# Patient Record
Sex: Female | Born: 1940
Health system: Southern US, Community
[De-identification: ages and names within clinical notes are randomized; demographics above are authoritative.]

## PROBLEM LIST (undated history)

## (undated) DIAGNOSIS — K589 Irritable bowel syndrome without diarrhea: Secondary | ICD-10-CM

## (undated) DIAGNOSIS — N6489 Other specified disorders of breast: Principal | ICD-10-CM

## (undated) DIAGNOSIS — N3000 Acute cystitis without hematuria: Secondary | ICD-10-CM

## (undated) DIAGNOSIS — E1122 Type 2 diabetes mellitus with diabetic chronic kidney disease: Secondary | ICD-10-CM

## (undated) DIAGNOSIS — L439 Lichen planus, unspecified: Secondary | ICD-10-CM

## (undated) DIAGNOSIS — E785 Hyperlipidemia, unspecified: Secondary | ICD-10-CM

## (undated) DIAGNOSIS — F329 Major depressive disorder, single episode, unspecified: Secondary | ICD-10-CM

## (undated) DIAGNOSIS — Z780 Asymptomatic menopausal state: Secondary | ICD-10-CM

## (undated) DIAGNOSIS — K851 Biliary acute pancreatitis without necrosis or infection: Secondary | ICD-10-CM

## (undated) DIAGNOSIS — M1990 Unspecified osteoarthritis, unspecified site: Secondary | ICD-10-CM

## (undated) DIAGNOSIS — I10 Essential (primary) hypertension: Secondary | ICD-10-CM

## (undated) DIAGNOSIS — N183 Chronic kidney disease, stage 3 unspecified: Secondary | ICD-10-CM

## (undated) DIAGNOSIS — F419 Anxiety disorder, unspecified: Secondary | ICD-10-CM

## (undated) DIAGNOSIS — N3941 Urge incontinence: Secondary | ICD-10-CM

## (undated) DIAGNOSIS — R011 Cardiac murmur, unspecified: Secondary | ICD-10-CM

## (undated) DIAGNOSIS — K219 Gastro-esophageal reflux disease without esophagitis: Secondary | ICD-10-CM

## (undated) DIAGNOSIS — F32A Depression, unspecified: Secondary | ICD-10-CM

## (undated) DIAGNOSIS — M797 Fibromyalgia: Secondary | ICD-10-CM

## (undated) DIAGNOSIS — E119 Type 2 diabetes mellitus without complications: Secondary | ICD-10-CM

## (undated) DIAGNOSIS — G4700 Insomnia, unspecified: Secondary | ICD-10-CM

## (undated) HISTORY — DX: Fibromyalgia: M79.7

## (undated) HISTORY — DX: Type 2 diabetes mellitus without complications: E11.9

## (undated) HISTORY — PX: BREAST SURGERY: SHX581

## (undated) HISTORY — DX: Major depressive disorder, single episode, unspecified: F32.9

## (undated) HISTORY — DX: Acute cystitis without hematuria: N30.00

## (undated) HISTORY — DX: Hyperlipidemia, unspecified: E78.5

## (undated) HISTORY — DX: Unspecified osteoarthritis, unspecified site: M19.90

## (undated) HISTORY — DX: Irritable bowel syndrome, unspecified: K58.9

## (undated) HISTORY — PX: EYE SURGERY: SHX253

## (undated) HISTORY — DX: Chronic kidney disease, stage 3 unspecified: N18.30

## (undated) HISTORY — DX: Lichen planus, unspecified: L43.9

## (undated) HISTORY — DX: Biliary acute pancreatitis without necrosis or infection: K85.10

## (undated) HISTORY — DX: Depression, unspecified: F32.A

## (undated) HISTORY — PX: TOTAL KNEE ARTHROPLASTY: SHX125

## (undated) HISTORY — DX: Essential (primary) hypertension: I10

## (undated) HISTORY — DX: Type 2 diabetes mellitus with diabetic chronic kidney disease: E11.22

## (undated) HISTORY — DX: Urge incontinence: N39.41

## (undated) HISTORY — PX: CATARACT EXTRACTION W/ INTRAOCULAR LENS IMPLANT: SHX1309

## (undated) HISTORY — DX: Cardiac murmur, unspecified: R01.1

## (undated) HISTORY — DX: Asymptomatic menopausal state: Z78.0

## (undated) HISTORY — DX: Anxiety disorder, unspecified: F41.9

## (undated) HISTORY — DX: Insomnia, unspecified: G47.00

## (undated) HISTORY — PX: JOINT REPLACEMENT: SHX530

## (undated) HISTORY — DX: Chronic kidney disease, stage 3 (moderate): N18.3

## (undated) HISTORY — DX: Gastro-esophageal reflux disease without esophagitis: K21.9

## (undated) HISTORY — DX: Other specified disorders of breast: N64.89

---

## 1968-10-25 HISTORY — PX: TUBAL LIGATION: SHX77

## 1999-01-01 ENCOUNTER — Other Ambulatory Visit: Admission: RE | Admit: 1999-01-01 | Discharge: 1999-01-01 | Payer: Self-pay | Admitting: Family Medicine

## 2000-03-16 ENCOUNTER — Other Ambulatory Visit: Admission: RE | Admit: 2000-03-16 | Discharge: 2000-03-16 | Payer: Self-pay | Admitting: Family Medicine

## 2000-04-26 ENCOUNTER — Encounter: Payer: Self-pay | Admitting: Orthopedic Surgery

## 2000-04-26 ENCOUNTER — Ambulatory Visit (HOSPITAL_COMMUNITY): Admission: RE | Admit: 2000-04-26 | Discharge: 2000-04-26 | Payer: Self-pay | Admitting: Orthopedic Surgery

## 2000-12-14 ENCOUNTER — Encounter: Admission: RE | Admit: 2000-12-14 | Discharge: 2001-03-14 | Payer: Self-pay | Admitting: Endocrinology

## 2001-05-04 ENCOUNTER — Other Ambulatory Visit: Admission: RE | Admit: 2001-05-04 | Discharge: 2001-05-04 | Payer: Self-pay | Admitting: Family Medicine

## 2002-01-30 HISTORY — PX: TRIGGER FINGER RELEASE: SHX641

## 2002-07-24 ENCOUNTER — Other Ambulatory Visit: Admission: RE | Admit: 2002-07-24 | Discharge: 2002-07-24 | Payer: Self-pay | Admitting: Family Medicine

## 2002-07-26 ENCOUNTER — Ambulatory Visit (HOSPITAL_COMMUNITY): Admission: RE | Admit: 2002-07-26 | Discharge: 2002-07-26 | Payer: Self-pay | Admitting: *Deleted

## 2002-07-26 ENCOUNTER — Encounter (INDEPENDENT_AMBULATORY_CARE_PROVIDER_SITE_OTHER): Payer: Self-pay | Admitting: Specialist

## 2002-10-25 HISTORY — PX: CHOLECYSTECTOMY: SHX55

## 2003-01-05 ENCOUNTER — Inpatient Hospital Stay (HOSPITAL_COMMUNITY): Admission: EM | Admit: 2003-01-05 | Discharge: 2003-01-09 | Payer: Self-pay | Admitting: Emergency Medicine

## 2003-01-05 ENCOUNTER — Encounter: Payer: Self-pay | Admitting: Emergency Medicine

## 2003-01-07 ENCOUNTER — Encounter (INDEPENDENT_AMBULATORY_CARE_PROVIDER_SITE_OTHER): Payer: Self-pay | Admitting: Specialist

## 2003-01-07 ENCOUNTER — Encounter: Payer: Self-pay | Admitting: General Surgery

## 2003-09-10 ENCOUNTER — Other Ambulatory Visit: Admission: RE | Admit: 2003-09-10 | Discharge: 2003-09-10 | Payer: Self-pay | Admitting: Family Medicine

## 2004-02-23 HISTORY — PX: ANKLE SURGERY: SHX546

## 2004-04-15 ENCOUNTER — Encounter: Admission: RE | Admit: 2004-04-15 | Discharge: 2004-07-14 | Payer: Self-pay | Admitting: Orthopedic Surgery

## 2004-09-03 ENCOUNTER — Ambulatory Visit: Payer: Self-pay | Admitting: Endocrinology

## 2004-09-30 ENCOUNTER — Ambulatory Visit: Payer: Self-pay | Admitting: Endocrinology

## 2004-10-23 ENCOUNTER — Ambulatory Visit: Payer: Self-pay | Admitting: Endocrinology

## 2004-12-25 ENCOUNTER — Ambulatory Visit: Payer: Self-pay | Admitting: Internal Medicine

## 2005-02-03 ENCOUNTER — Other Ambulatory Visit: Admission: RE | Admit: 2005-02-03 | Discharge: 2005-02-03 | Payer: Self-pay | Admitting: Obstetrics and Gynecology

## 2005-04-28 ENCOUNTER — Ambulatory Visit: Payer: Self-pay | Admitting: Endocrinology

## 2005-05-10 ENCOUNTER — Ambulatory Visit: Payer: Self-pay | Admitting: Endocrinology

## 2005-08-10 ENCOUNTER — Ambulatory Visit: Payer: Self-pay | Admitting: Endocrinology

## 2005-08-27 ENCOUNTER — Ambulatory Visit: Payer: Self-pay | Admitting: Endocrinology

## 2005-12-22 ENCOUNTER — Ambulatory Visit: Payer: Self-pay | Admitting: Endocrinology

## 2006-01-25 ENCOUNTER — Ambulatory Visit: Payer: Self-pay | Admitting: Gastroenterology

## 2006-02-19 ENCOUNTER — Emergency Department (HOSPITAL_COMMUNITY): Admission: EM | Admit: 2006-02-19 | Discharge: 2006-02-19 | Payer: Self-pay | Admitting: Emergency Medicine

## 2006-02-23 ENCOUNTER — Ambulatory Visit: Payer: Self-pay | Admitting: Gastroenterology

## 2006-02-23 ENCOUNTER — Encounter (INDEPENDENT_AMBULATORY_CARE_PROVIDER_SITE_OTHER): Payer: Self-pay | Admitting: Specialist

## 2006-03-03 ENCOUNTER — Ambulatory Visit: Payer: Self-pay | Admitting: Gastroenterology

## 2006-04-04 ENCOUNTER — Ambulatory Visit: Payer: Self-pay | Admitting: Gastroenterology

## 2006-04-11 ENCOUNTER — Ambulatory Visit: Payer: Self-pay | Admitting: Endocrinology

## 2006-04-25 ENCOUNTER — Ambulatory Visit: Payer: Self-pay

## 2006-04-25 ENCOUNTER — Encounter: Payer: Self-pay | Admitting: Cardiology

## 2006-05-18 ENCOUNTER — Ambulatory Visit: Payer: Self-pay | Admitting: *Deleted

## 2006-05-20 ENCOUNTER — Ambulatory Visit: Payer: Self-pay | Admitting: Internal Medicine

## 2006-05-20 ENCOUNTER — Inpatient Hospital Stay (HOSPITAL_BASED_OUTPATIENT_CLINIC_OR_DEPARTMENT_OTHER): Admission: RE | Admit: 2006-05-20 | Discharge: 2006-05-20 | Payer: Self-pay | Admitting: Internal Medicine

## 2006-06-03 ENCOUNTER — Ambulatory Visit: Payer: Self-pay | Admitting: Cardiology

## 2006-06-22 ENCOUNTER — Ambulatory Visit: Payer: Self-pay | Admitting: *Deleted

## 2006-07-21 ENCOUNTER — Ambulatory Visit: Payer: Self-pay | Admitting: *Deleted

## 2006-07-28 ENCOUNTER — Ambulatory Visit: Payer: Self-pay | Admitting: Endocrinology

## 2006-09-27 ENCOUNTER — Ambulatory Visit: Payer: Self-pay | Admitting: Endocrinology

## 2007-01-18 ENCOUNTER — Ambulatory Visit: Payer: Self-pay | Admitting: *Deleted

## 2007-02-13 ENCOUNTER — Ambulatory Visit: Payer: Self-pay | Admitting: Endocrinology

## 2007-05-18 ENCOUNTER — Ambulatory Visit: Payer: Self-pay | Admitting: Endocrinology

## 2007-05-22 ENCOUNTER — Encounter: Payer: Self-pay | Admitting: Endocrinology

## 2007-05-22 DIAGNOSIS — E785 Hyperlipidemia, unspecified: Secondary | ICD-10-CM

## 2007-05-22 DIAGNOSIS — I10 Essential (primary) hypertension: Secondary | ICD-10-CM

## 2007-05-22 DIAGNOSIS — E1121 Type 2 diabetes mellitus with diabetic nephropathy: Secondary | ICD-10-CM

## 2007-05-22 DIAGNOSIS — E1169 Type 2 diabetes mellitus with other specified complication: Secondary | ICD-10-CM | POA: Insufficient documentation

## 2007-05-22 DIAGNOSIS — E1159 Type 2 diabetes mellitus with other circulatory complications: Secondary | ICD-10-CM

## 2007-05-22 DIAGNOSIS — F411 Generalized anxiety disorder: Secondary | ICD-10-CM

## 2007-07-03 ENCOUNTER — Encounter: Payer: Self-pay | Admitting: Endocrinology

## 2007-07-11 ENCOUNTER — Ambulatory Visit: Payer: Self-pay | Admitting: Endocrinology

## 2008-01-08 ENCOUNTER — Encounter: Payer: Self-pay | Admitting: Endocrinology

## 2008-01-10 ENCOUNTER — Ambulatory Visit: Payer: Self-pay | Admitting: Endocrinology

## 2008-01-10 DIAGNOSIS — M810 Age-related osteoporosis without current pathological fracture: Secondary | ICD-10-CM | POA: Insufficient documentation

## 2008-01-10 LAB — CONVERTED CEMR LAB
AST: 30 units/L (ref 0–37)
Albumin: 3.4 g/dL — ABNORMAL LOW (ref 3.5–5.2)
Chloride: 94 meq/L — ABNORMAL LOW (ref 96–112)
GFR calc non Af Amer: 48 mL/min
HDL: 42.8 mg/dL (ref >39.0)
Hgb A1c MFr Bld: 9.9 % — ABNORMAL HIGH (ref 4.6–6.0)
LDL Cholesterol: 57 mg/dL (ref 0–99)
Potassium: 4 meq/L (ref 3.5–5.1)
Sodium: 136 meq/L (ref 135–145)
TSH: 2.59 microintl units/mL (ref 0.35–5.50)
VLDL: 23 mg/dL (ref 0–40)

## 2008-01-11 ENCOUNTER — Encounter: Payer: Self-pay | Admitting: Endocrinology

## 2008-01-12 LAB — CONVERTED CEMR LAB
Calcium, Total (PTH): 9.3 mg/dL (ref 8.4–10.5)
PTH: 28 pg/mL (ref 14.0–72.0)
Total Protein, Serum Electrophoresis: 7.2 g/dL (ref 6.0–8.3)

## 2008-01-22 ENCOUNTER — Telehealth (INDEPENDENT_AMBULATORY_CARE_PROVIDER_SITE_OTHER): Payer: Self-pay | Admitting: *Deleted

## 2008-01-24 ENCOUNTER — Ambulatory Visit: Payer: Self-pay | Admitting: Endocrinology

## 2008-01-24 DIAGNOSIS — N3000 Acute cystitis without hematuria: Secondary | ICD-10-CM | POA: Insufficient documentation

## 2008-01-24 LAB — CONVERTED CEMR LAB
Glucose, Urine, Semiquant: NEGATIVE
Ketones, urine, test strip: NEGATIVE
Protein, U semiquant: NEGATIVE

## 2008-01-29 ENCOUNTER — Telehealth (INDEPENDENT_AMBULATORY_CARE_PROVIDER_SITE_OTHER): Payer: Self-pay | Admitting: *Deleted

## 2008-01-30 ENCOUNTER — Ambulatory Visit: Payer: Self-pay | Admitting: Endocrinology

## 2008-01-31 LAB — CONVERTED CEMR LAB
Bilirubin Urine: NEGATIVE
Crystals: NEGATIVE
Mucus, UA: NEGATIVE
Nitrite: NEGATIVE
RBC / HPF: NONE SEEN
Specific Gravity, Urine: <=1.005 (ref 1.000–1.03)
Urobilinogen, UA: 0.2 (ref 0.0–1.0)
pH: 6 (ref 5.0–8.0)

## 2008-02-09 ENCOUNTER — Encounter: Payer: Self-pay | Admitting: Endocrinology

## 2008-02-19 ENCOUNTER — Telehealth (INDEPENDENT_AMBULATORY_CARE_PROVIDER_SITE_OTHER): Payer: Self-pay | Admitting: *Deleted

## 2008-03-04 ENCOUNTER — Ambulatory Visit: Payer: Self-pay | Admitting: Endocrinology

## 2008-03-04 LAB — CONVERTED CEMR LAB
Ketones, urine, test strip: NEGATIVE
Specific Gravity, Urine: 1.01
Urobilinogen, UA: 0.2
pH: 5

## 2008-03-14 ENCOUNTER — Telehealth (INDEPENDENT_AMBULATORY_CARE_PROVIDER_SITE_OTHER): Payer: Self-pay | Admitting: *Deleted

## 2008-03-25 ENCOUNTER — Telehealth (INDEPENDENT_AMBULATORY_CARE_PROVIDER_SITE_OTHER): Payer: Self-pay | Admitting: *Deleted

## 2008-04-11 ENCOUNTER — Encounter: Payer: Self-pay | Admitting: Endocrinology

## 2008-05-16 ENCOUNTER — Ambulatory Visit: Payer: Self-pay | Admitting: Endocrinology

## 2008-05-16 LAB — CONVERTED CEMR LAB
Bilirubin Urine: NEGATIVE
Glucose, Urine, Semiquant: NEGATIVE
Nitrite: NEGATIVE
Specific Gravity, Urine: <1.005
pH: 5

## 2008-05-17 ENCOUNTER — Encounter: Payer: Self-pay | Admitting: Endocrinology

## 2008-05-20 ENCOUNTER — Telehealth (INDEPENDENT_AMBULATORY_CARE_PROVIDER_SITE_OTHER): Payer: Self-pay | Admitting: *Deleted

## 2008-05-22 ENCOUNTER — Ambulatory Visit: Payer: Self-pay | Admitting: Endocrinology

## 2008-05-24 LAB — CONVERTED CEMR LAB: Hgb A1c MFr Bld: 9.1 % — ABNORMAL HIGH (ref 4.6–6.0)

## 2008-06-19 ENCOUNTER — Telehealth (INDEPENDENT_AMBULATORY_CARE_PROVIDER_SITE_OTHER): Payer: Self-pay | Admitting: *Deleted

## 2008-06-21 ENCOUNTER — Telehealth: Payer: Self-pay | Admitting: Endocrinology

## 2008-07-04 ENCOUNTER — Ambulatory Visit: Payer: Self-pay | Admitting: Endocrinology

## 2008-07-04 LAB — CONVERTED CEMR LAB
Bilirubin Urine: NEGATIVE
Ketones, urine, test strip: NEGATIVE
Nitrite: NEGATIVE
Protein, U semiquant: NEGATIVE

## 2008-07-08 ENCOUNTER — Telehealth (INDEPENDENT_AMBULATORY_CARE_PROVIDER_SITE_OTHER): Payer: Self-pay | Admitting: *Deleted

## 2008-07-11 ENCOUNTER — Telehealth (INDEPENDENT_AMBULATORY_CARE_PROVIDER_SITE_OTHER): Payer: Self-pay | Admitting: *Deleted

## 2008-07-12 ENCOUNTER — Telehealth (INDEPENDENT_AMBULATORY_CARE_PROVIDER_SITE_OTHER): Payer: Self-pay | Admitting: *Deleted

## 2008-07-17 ENCOUNTER — Telehealth (INDEPENDENT_AMBULATORY_CARE_PROVIDER_SITE_OTHER): Payer: Self-pay | Admitting: *Deleted

## 2008-07-17 ENCOUNTER — Ambulatory Visit: Payer: Self-pay | Admitting: Endocrinology

## 2008-07-17 DIAGNOSIS — M545 Low back pain: Secondary | ICD-10-CM | POA: Insufficient documentation

## 2008-07-25 ENCOUNTER — Ambulatory Visit: Payer: Self-pay | Admitting: Endocrinology

## 2008-07-29 ENCOUNTER — Telehealth: Payer: Self-pay | Admitting: Endocrinology

## 2008-08-29 ENCOUNTER — Ambulatory Visit: Payer: Self-pay | Admitting: Endocrinology

## 2008-08-29 DIAGNOSIS — K589 Irritable bowel syndrome without diarrhea: Secondary | ICD-10-CM

## 2008-09-05 ENCOUNTER — Encounter: Payer: Self-pay | Admitting: Endocrinology

## 2008-11-28 ENCOUNTER — Telehealth (INDEPENDENT_AMBULATORY_CARE_PROVIDER_SITE_OTHER): Payer: Self-pay | Admitting: *Deleted

## 2008-12-05 ENCOUNTER — Telehealth (INDEPENDENT_AMBULATORY_CARE_PROVIDER_SITE_OTHER): Payer: Self-pay | Admitting: *Deleted

## 2008-12-24 ENCOUNTER — Ambulatory Visit: Payer: Self-pay | Admitting: Endocrinology

## 2008-12-24 DIAGNOSIS — L98499 Non-pressure chronic ulcer of skin of other sites with unspecified severity: Secondary | ICD-10-CM

## 2008-12-26 ENCOUNTER — Telehealth (INDEPENDENT_AMBULATORY_CARE_PROVIDER_SITE_OTHER): Payer: Self-pay | Admitting: *Deleted

## 2008-12-30 ENCOUNTER — Telehealth (INDEPENDENT_AMBULATORY_CARE_PROVIDER_SITE_OTHER): Payer: Self-pay | Admitting: *Deleted

## 2009-01-13 ENCOUNTER — Ambulatory Visit: Payer: Self-pay | Admitting: Endocrinology

## 2009-02-03 ENCOUNTER — Ambulatory Visit: Payer: Self-pay | Admitting: Endocrinology

## 2009-02-03 LAB — CONVERTED CEMR LAB
Glucose, Urine, Semiquant: >=1000
Protein, U semiquant: NEGATIVE

## 2009-03-06 ENCOUNTER — Telehealth (INDEPENDENT_AMBULATORY_CARE_PROVIDER_SITE_OTHER): Payer: Self-pay | Admitting: *Deleted

## 2009-03-12 ENCOUNTER — Telehealth: Payer: Self-pay | Admitting: Endocrinology

## 2009-03-14 ENCOUNTER — Ambulatory Visit: Payer: Self-pay | Admitting: Endocrinology

## 2009-03-14 DIAGNOSIS — R32 Unspecified urinary incontinence: Secondary | ICD-10-CM | POA: Insufficient documentation

## 2009-03-19 ENCOUNTER — Encounter: Payer: Self-pay | Admitting: Endocrinology

## 2009-03-19 ENCOUNTER — Telehealth: Payer: Self-pay | Admitting: Internal Medicine

## 2009-03-20 ENCOUNTER — Encounter: Payer: Self-pay | Admitting: Endocrinology

## 2009-03-31 ENCOUNTER — Telehealth (INDEPENDENT_AMBULATORY_CARE_PROVIDER_SITE_OTHER): Payer: Self-pay | Admitting: *Deleted

## 2009-04-01 ENCOUNTER — Telehealth (INDEPENDENT_AMBULATORY_CARE_PROVIDER_SITE_OTHER): Payer: Self-pay | Admitting: *Deleted

## 2009-04-04 ENCOUNTER — Ambulatory Visit: Payer: Self-pay | Admitting: Endocrinology

## 2009-04-04 DIAGNOSIS — N760 Acute vaginitis: Secondary | ICD-10-CM | POA: Insufficient documentation

## 2009-04-04 DIAGNOSIS — G4700 Insomnia, unspecified: Secondary | ICD-10-CM

## 2009-04-04 LAB — CONVERTED CEMR LAB
AST: 27 units/L (ref 0–37)
Albumin: 3.8 g/dL (ref 3.5–5.2)
Alkaline Phosphatase: 70 units/L (ref 39–117)
Bilirubin, Direct: 0.1 mg/dL (ref 0.0–0.3)
CO2: 31 meq/L (ref 19–32)
Calcium: 9.4 mg/dL (ref 8.4–10.5)
Cholesterol: 128 mg/dL (ref 0–200)
Creatinine,U: 59.4 mg/dL
Glucose, Bld: 128 mg/dL — ABNORMAL HIGH (ref 70–99)
Hgb A1c MFr Bld: 8.8 % — ABNORMAL HIGH (ref 4.6–6.5)
LDL Cholesterol: 59 mg/dL (ref 0–99)
Microalb, Ur: 0.4 mg/dL (ref 0.0–1.9)
Potassium: 4.2 meq/L (ref 3.5–5.1)
Sodium: 137 meq/L (ref 135–145)
Total CHOL/HDL Ratio: 3
Triglycerides: 103 mg/dL (ref 0.0–149.0)

## 2009-04-09 ENCOUNTER — Telehealth: Payer: Self-pay | Admitting: Endocrinology

## 2009-04-10 ENCOUNTER — Telehealth: Payer: Self-pay | Admitting: Internal Medicine

## 2009-04-11 ENCOUNTER — Ambulatory Visit: Payer: Self-pay | Admitting: Internal Medicine

## 2009-04-11 ENCOUNTER — Inpatient Hospital Stay (HOSPITAL_COMMUNITY): Admission: EM | Admit: 2009-04-11 | Discharge: 2009-04-16 | Payer: Self-pay | Admitting: Emergency Medicine

## 2009-04-18 ENCOUNTER — Telehealth: Payer: Self-pay | Admitting: Endocrinology

## 2009-04-20 ENCOUNTER — Inpatient Hospital Stay (HOSPITAL_COMMUNITY): Admission: EM | Admit: 2009-04-20 | Discharge: 2009-04-24 | Payer: Self-pay | Admitting: Emergency Medicine

## 2009-04-20 ENCOUNTER — Ambulatory Visit: Payer: Self-pay | Admitting: Internal Medicine

## 2009-04-21 ENCOUNTER — Ambulatory Visit: Payer: Self-pay | Admitting: Internal Medicine

## 2009-04-24 ENCOUNTER — Telehealth: Payer: Self-pay | Admitting: Endocrinology

## 2009-04-29 ENCOUNTER — Telehealth (INDEPENDENT_AMBULATORY_CARE_PROVIDER_SITE_OTHER): Payer: Self-pay | Admitting: *Deleted

## 2009-05-01 ENCOUNTER — Telehealth: Payer: Self-pay | Admitting: Endocrinology

## 2009-05-01 ENCOUNTER — Emergency Department (HOSPITAL_COMMUNITY): Admission: EM | Admit: 2009-05-01 | Discharge: 2009-05-01 | Payer: Self-pay | Admitting: Emergency Medicine

## 2009-05-05 ENCOUNTER — Ambulatory Visit: Payer: Self-pay | Admitting: Endocrinology

## 2009-05-05 ENCOUNTER — Telehealth: Payer: Self-pay | Admitting: Endocrinology

## 2009-05-06 ENCOUNTER — Ambulatory Visit: Payer: Self-pay | Admitting: Endocrinology

## 2009-05-09 ENCOUNTER — Ambulatory Visit: Payer: Self-pay | Admitting: Endocrinology

## 2009-05-09 DIAGNOSIS — E876 Hypokalemia: Secondary | ICD-10-CM

## 2009-05-09 LAB — CONVERTED CEMR LAB
BUN: 17 mg/dL (ref 6–23)
Chloride: 97 meq/L (ref 96–112)
Creatinine, Ser: 1.2 mg/dL (ref 0.4–1.2)
Glucose, Bld: 240 mg/dL — ABNORMAL HIGH (ref 70–99)
Potassium: 3.8 meq/L (ref 3.5–5.1)

## 2009-05-12 ENCOUNTER — Telehealth: Payer: Self-pay | Admitting: Endocrinology

## 2009-05-14 ENCOUNTER — Encounter: Payer: Self-pay | Admitting: Internal Medicine

## 2009-05-29 ENCOUNTER — Telehealth (INDEPENDENT_AMBULATORY_CARE_PROVIDER_SITE_OTHER): Payer: Self-pay | Admitting: *Deleted

## 2009-06-03 ENCOUNTER — Telehealth (INDEPENDENT_AMBULATORY_CARE_PROVIDER_SITE_OTHER): Payer: Self-pay | Admitting: *Deleted

## 2009-06-10 ENCOUNTER — Encounter: Payer: Self-pay | Admitting: Endocrinology

## 2009-07-04 ENCOUNTER — Encounter: Payer: Self-pay | Admitting: Endocrinology

## 2009-07-17 ENCOUNTER — Ambulatory Visit: Payer: Self-pay | Admitting: Endocrinology

## 2009-07-17 DIAGNOSIS — M797 Fibromyalgia: Secondary | ICD-10-CM

## 2009-07-17 DIAGNOSIS — I059 Rheumatic mitral valve disease, unspecified: Secondary | ICD-10-CM | POA: Insufficient documentation

## 2009-07-17 DIAGNOSIS — L439 Lichen planus, unspecified: Secondary | ICD-10-CM

## 2009-07-17 DIAGNOSIS — E875 Hyperkalemia: Secondary | ICD-10-CM

## 2009-07-18 LAB — CONVERTED CEMR LAB
BUN: 23 mg/dL (ref 6–23)
CO2: 32 meq/L (ref 19–32)
Calcium: 9.1 mg/dL (ref 8.4–10.5)
Creatinine, Ser: 1.2 mg/dL (ref 0.4–1.2)
GFR calc non Af Amer: 47.33 mL/min (ref >60)
Glucose, Bld: 151 mg/dL — ABNORMAL HIGH (ref 70–99)

## 2009-07-21 ENCOUNTER — Encounter: Payer: Self-pay | Admitting: Endocrinology

## 2009-08-26 ENCOUNTER — Encounter: Payer: Self-pay | Admitting: Endocrinology

## 2009-10-03 ENCOUNTER — Telehealth: Payer: Self-pay | Admitting: Endocrinology

## 2009-10-15 ENCOUNTER — Encounter: Payer: Self-pay | Admitting: Endocrinology

## 2009-10-22 ENCOUNTER — Ambulatory Visit: Payer: Self-pay | Admitting: Endocrinology

## 2009-10-22 ENCOUNTER — Encounter (INDEPENDENT_AMBULATORY_CARE_PROVIDER_SITE_OTHER): Payer: Self-pay | Admitting: *Deleted

## 2009-10-31 ENCOUNTER — Encounter: Payer: Self-pay | Admitting: Endocrinology

## 2009-12-11 ENCOUNTER — Telehealth (INDEPENDENT_AMBULATORY_CARE_PROVIDER_SITE_OTHER): Payer: Self-pay | Admitting: *Deleted

## 2009-12-19 ENCOUNTER — Encounter: Payer: Self-pay | Admitting: Endocrinology

## 2009-12-30 ENCOUNTER — Encounter: Admission: RE | Admit: 2009-12-30 | Discharge: 2010-03-30 | Payer: Self-pay | Admitting: Orthopedic Surgery

## 2010-01-02 ENCOUNTER — Encounter: Payer: Self-pay | Admitting: Endocrinology

## 2010-01-28 ENCOUNTER — Encounter: Payer: Self-pay | Admitting: Internal Medicine

## 2010-03-31 ENCOUNTER — Encounter: Admission: RE | Admit: 2010-03-31 | Discharge: 2010-06-29 | Payer: Self-pay | Admitting: Orthopedic Surgery

## 2010-05-07 ENCOUNTER — Encounter: Payer: Self-pay | Admitting: Endocrinology

## 2010-06-11 ENCOUNTER — Encounter: Payer: Self-pay | Admitting: Cardiology

## 2010-06-22 ENCOUNTER — Ambulatory Visit: Payer: Self-pay | Admitting: Cardiology

## 2010-07-10 ENCOUNTER — Ambulatory Visit (HOSPITAL_BASED_OUTPATIENT_CLINIC_OR_DEPARTMENT_OTHER): Admission: RE | Admit: 2010-07-10 | Discharge: 2010-07-10 | Payer: Self-pay | Admitting: Orthopedic Surgery

## 2010-09-25 ENCOUNTER — Ambulatory Visit (HOSPITAL_BASED_OUTPATIENT_CLINIC_OR_DEPARTMENT_OTHER)
Admission: RE | Admit: 2010-09-25 | Discharge: 2010-09-25 | Payer: Self-pay | Source: Home / Self Care | Admitting: Orthopedic Surgery

## 2010-10-26 ENCOUNTER — Encounter
Admission: RE | Admit: 2010-10-26 | Discharge: 2010-11-24 | Payer: Self-pay | Source: Home / Self Care | Attending: Orthopedic Surgery | Admitting: Orthopedic Surgery

## 2010-11-25 ENCOUNTER — Encounter: Payer: Self-pay | Admitting: Physical Therapy

## 2010-11-26 NOTE — Letter (Signed)
Summary: [facility]   Imported By: Sherian Rein 05/12/2010 11:45:54  _____________________________________________________________________  External Attachment:    Type:   Image     Comment:   External Document

## 2010-11-26 NOTE — Consult Note (Signed)
Summary: Aundra Dubin, MD  Aundra Dubin, MD   Imported By: Lester Chamberino 11/18/2009 16:32:12  _____________________________________________________________________  External Attachment:    Type:   Image     Comment:   External Document

## 2010-11-26 NOTE — Progress Notes (Signed)
Summary: Records request from [facility] for patient transfer  Request for records received from [facility]. Request forwarded to [employer]. Dena Chavis  December 11, 2009 1:05 PM  Appended Document: Records request from [facility] for patient transfer 2nd request for records received from [facility]. Request forwarded to [employer].

## 2010-11-26 NOTE — Assessment & Plan Note (Signed)
Summary: np- last seen 2008 knee surgeryclearance  Medications Added FISH OIL 1000 MG  CAPS (OMEGA-3 FATTY ACIDS) take 1 by mouth once daily.tidtab NOVOLOG MIX 70/30 70-30 % SUSP (INSULIN ASPART PROT & ASPART) use as directed VITAMIN B-12 100 MCG TABS (CYANOCOBALAMIN) Take 1 tablet by mouth once a day TRAZODONE HCL 150 MG TABS (TRAZODONE HCL) Take 1 tablet by mouth once a day at bedtime ENABLEX 15 MG XR24H-TAB (DARIFENACIN HYDROBROMIDE) Take 1 tablet by mouth once a day MAG-OX 400 400 MG TABS (MAGNESIUM OXIDE) Take 1 tablet by mouth once a day DESOWEN 0.05 % OINT (DESONIDE) apply topically as directed ESTRACE 0.1 MG/GM CREA (ESTRADIOL) apply topically as directed CYMBALTA 60 MG CPEP (DULOXETINE HCL) Take 1 tablet by mouth once a day MACRODANTIN 100 MG CAPS (NITROFURANTOIN MACROCRYSTAL) Take 1 tablet by mouth once a day CHOLESTYRAMINE  POWD (CHOLESTYRAMINE) Take 1 packet by mouth two times a day BENADRYL 25 MG TABS (DIPHENHYDRAMINE HCL) Take 1 tablet by mouth three times a day ALIGN  CAPS (PROBIOTIC PRODUCT) Take 1 tablet by mouth once a day MULTIVITAMINS  TABS (MULTIPLE VITAMIN) Take 1/2 tablet by mouth two times a day BEANO  TABS (ALPHA-D-GALACTOSIDASE) Take 1 tablet by mouth two times a day KETOROLAC TROMETHAMINE 10 MG TABS (KETOROLAC TROMETHAMINE) take one by mouth every six hours as needed pain        Visit Type:  Initial Consult Referring Provider:  Dr. Marlowe Kays Primary Provider:  Dr. Kyra Manges   History of Present Illness: 70 year old woman presents for cardiology consultation. Records indicate that she was seen by Dr. Corinda Gubler back in 2008 and has a history of normal coronary arteries documented at cardiac catheterization in 2007. She is referred for preoperative evaluation. Elective arthroscopic left knee surgery is being considered at this time, with general anesthesia.  She does not endorse any exertional chest pain. She reports NYHA class II dyspnea on exertion,  with main functional limitation related to leg/knee pain. She has occasional ankle edema, not progressive. No orthopnea, PND, or palpitations.  I reviewed her medications, which are multiple, outlined below.  Electrocardiogram done recently at [facility] showed sinus rhythm at 86 beats per minutes with left atrial enlargement and increased voltage, nonspecific T wave changes.  Preventive Screening-Counseling & Management  Alcohol-Tobacco     Smoking Status: never  Current Medications (verified): 1)  Toprol Xl 25 Mg  Tb24 (Metoprolol Succinate) .... Take 1/2  By Mouth Once Daily 2)  Lexapro 20 Mg  Tabs (Escitalopram Oxalate) .... Take 2 By Mouth Qd 3)  Furosemide 40 Mg  Tabs (Furosemide) .... Take 1 By Mouth Qd 4)  Lipitor 20 Mg  Tabs (Atorvastatin Calcium) .... Take 1 By Mouth Qd 5)  Nexium 40 Mg  Cpdr (Esomeprazole Magnesium) .... Take 1 By Mouth Two Times A Day Qd 6)  Calcium 600/vitamin D 600-200 Mg-Unit  Tabs (Calcium Carbonate-Vitamin D) .... Take 1 By Mouth Two Times A Day 7)  Fish Oil 1000 Mg  Caps (Omega-3 Fatty Acids) .... Take 1 By Mouth Once Daily.tidtab 8)  Klor-Con M20 20 Meq  Tbcr (Potassium Chloride Crys Cr) .... Take 1 By Mouth Qd 9)  Bd Insulin Syringe Ultrafine 31g X 5/16" 0.3 Ml  Misc (Insulin Syringe-Needle U-100) .... Use As Directed 10)  Bd Insulin Syringe Ultrafine 31g X 5/16" 1 Ml  Misc (Insulin Syringe-Needle U-100) .... Use As Directed 11)  Novolog Mix 70/30 70-30 % Susp (Insulin Aspart Prot & Aspart) .... Use As  Directed 12)  Freestyle Test   Strp (Glucose Blood) .... Qid 250.00 13)  Adult Aspirin Ec Low Strength 81 Mg Tbec (Aspirin) .Marland Kitchen.. 1 By Mouth Once Daily 14)  Vitamin B-12 100 Mcg Tabs (Cyanocobalamin) .... Take 1 Tablet By Mouth Once A Day 15)  Trazodone Hcl 150 Mg Tabs (Trazodone Hcl) .... Take 1 Tablet By Mouth Once A Day At Bedtime 16)  Enablex 15 Mg Xr24h-Tab (Darifenacin Hydrobromide) .... Take 1 Tablet By Mouth Once A Day 17)   Mag-Ox 400 400 Mg Tabs (Magnesium Oxide) .... Take 1 Tablet By Mouth Once A Day 18)  Desowen 0.05 % Oint (Desonide) .... Apply Topically As Directed 19)  Estrace 0.1 Mg/gm Crea (Estradiol) .... Apply Topically As Directed 20)  Cymbalta 60 Mg Cpep (Duloxetine Hcl) .... Take 1 Tablet By Mouth Once A Day 21)  Macrodantin 100 Mg Caps (Nitrofurantoin Macrocrystal) .... Take 1 Tablet By Mouth Once A Day 22)  Cholestyramine  Powd (Cholestyramine) .... Take 1 Packet By Mouth Two Times A Day 23)  Benadryl 25 Mg Tabs (Diphenhydramine Hcl) .... Take 1 Tablet By Mouth Three Times A Day 24)  Align  Caps (Probiotic Product) .... Take 1 Tablet By Mouth Once A Day 25)  Multivitamins  Tabs (Multiple Vitamin) .... Take 1/2 Tablet By Mouth Two Times A Day 26)  Beano  Tabs (Alpha-D-Galactosidase) .... Take 1 Tablet By Mouth Two Times A Day 27)  Ketorolac Tromethamine 10 Mg Tabs (Ketorolac Tromethamine) .... Take One By Mouth Every Six Hours As Needed Pain  Allergies: 1)  ! Hydrocodone 2)  ! * Levbid 3)  ! * Desipamine 4)  ! Amoxicillin 5)  ! Ambien (Zolpidem Tartrate) 6)  ! Vicodin 7)  ! Morphine  Comments:  Nurse/Medical Assistant: The patient's medication list and allergies were reviewed with the patient and were updated in the Medication and Allergy Lists.  Past History:  Family History: Last updated: 06/22/2010 Family History of Diabetes  Social History: Last updated: 06/22/2010 Widowed 2010 Retired Tobacco Use - No Alcohol Use - no  Past Medical History: Fibromyalgia IBS MVP Diabetic Retinopathy Osteoporosis Acute cystitis Depression Diabetes Type 2 Hyperlipidemia Hypertension  Past Surgical History: Cataract Lens Implant (09/2003, & 12/2003) Cholecystectomy (2004) Tubal ligation (1970) Ankle surgery (02/2004)  Family History: Family History of Diabetes  Social History: Widowed 2010 Retired Tobacco Use - No Alcohol Use - no  Review of Systems       The patient  complains of peripheral edema.  The patient denies anorexia, fever, weight loss, chest pain, syncope, prolonged cough, headaches, hemoptysis, melena, hematochezia, and severe indigestion/heartburn.         Otherwise reviewed and negative except as outlined.  Vital Signs:  Patient profile:   70 year old female Height:      63 inches Weight:      207 pounds BMI:     36.80 Pulse rate:   66 / minute BP sitting:   122 / 75  (left arm) Cuff size:   large  Vitals Entered By: Carlye Grippe (June 22, 2010 2:14 PM)  Nutrition Counseling: Patient's BMI is greater than 25 and therefore counseled on weight management options.  Physical Exam  Additional Exam:  Obese woman in no acute distress. HEENT: Conjunctiva and lids normal, oropharynx with moist mucosa. Neck: Supple, no elevated JVP or carotid bruits. No thyromegaly. Lungs: Clear to auscultation, nonlabored. Cardiac: Regular rate and rhythm, no mid systolic click, no loud systolic murmur. No S3. Abdomen: Soft, nontender,  bowel sounds present. Extremity: No frank pitting edema, distal pulses 1-2+. Skin: Warm and dry. Musculoskeletal: No kyphosis. Neuropsychiatric: Alert and oriented x3, affect appropriate.   Cardiac Cath  Procedure date:  05/20/2006  Findings:      Central aortic pressure 99/53, the mean is 74.  LV pressure 90 with an LVEDP  of 17.  There is no aortic stenosis.   Left main was normal.   Left circumflex was a large vessel.  It gave off a large ramus, a small  first posterolateral and a large second posterolateral.  Angiographically  normal.  LAD was a long vessel wrapping the apex that gave off three small  diagonals, angiographically normal.   Right coronary artery was a moderate-sized dominant vessel.  It gave off a  large acute marginal branch and a small PDA.  It was angiographically  normal.   Left ventriculogram done in the RAO position showed a hyperdynamic ventricle  with an EF of 70-75%.  No wall  motion abnormalities.  No mitral  regurgitation.  Impression & Recommendations:  Problem # 1:  PREOPERATIVE EXAMINATION (ICD-V22.26)  70 year old woman with history of hypertension, type 2 diabetes mellitus, hyperlipidemia, and documented normal coronary arteries at cardiac catheterization 2007, being considered for elective arthroscopic left knee surgery under general anesthesia. No active anginal symptoms or progressive shortness of breath. Recently electrocardiogram is nonspecific. Medical therapy includes a beta blocker. At this point would not anticipate any further cardiac evaluation prior to proceeding with surgery, which overall should be fairly low risk from a cardiac perspective.  Problem # 2:  MITRAL VALVE PROLAPSE (ICD-424.0)  Diagnosis based on limited history. Evaluation by echocardiography in 2004 revealed only some thickening of the valve but no obvious prolapse, only trace mitral regurgitation. No major exam findings to suggest progression.  Her updated medication list for this problem includes:    Toprol Xl 25 Mg Tb24 (Metoprolol succinate) .Marland Kitchen... Take 1/2  by mouth once daily    Furosemide 40 Mg Tabs (Furosemide) .Marland Kitchen... Take 1 by mouth qd  Problem # 3:  HYPERTENSION (ICD-401.9)  Blood pressure well controlled today.  Her updated medication list for this problem includes:    Toprol Xl 25 Mg Tb24 (Metoprolol succinate) .Marland Kitchen... Take 1/2  by mouth once daily    Furosemide 40 Mg Tabs (Furosemide) .Marland Kitchen... Take 1 by mouth qd    Adult Aspirin Ec Low Strength 81 Mg Tbec (Aspirin) .Marland Kitchen... 1 by mouth once daily  Patient Instructions: 1)  Your physician wants you to follow-up in: 6 months. You will receive a reminder letter in the mail one-two months in advance. If you don't receive a letter, please call our office to schedule the follow-up appointment. 2)  Your physician recommends that you continue on your current medications as directed. Please refer to the Current Medication list given  to you today.

## 2010-11-26 NOTE — Letter (Signed)
Summary: External Correspondence/ OFFICE VISIT [facility]  External Correspondence/ OFFICE VISIT [facility]   Imported By: Dorise Hiss 06/12/2010 08:35:14  _____________________________________________________________________  External Attachment:    Type:   Image     Comment:   External Document

## 2010-11-26 NOTE — Letter (Signed)
Summary: CMN for [facility]  CMN for [facility]   Imported By: Sherian Rein 01/30/2010 13:35:02  _____________________________________________________________________  External Attachment:    Type:   Image     Comment:   External Document

## 2010-11-26 NOTE — Letter (Signed)
Summary: Office Visit / [facility]   Office Visit / [facility]   Imported By: Lennie Odor 10/27/2009 14:18:06  _____________________________________________________________________  External Attachment:    Type:   Image     Comment:   External Document

## 2010-11-26 NOTE — Assessment & Plan Note (Signed)
Summary: 6 WKS FU--$50---STC   Vital Signs:  Patient Profile:   70 Years Old Female Weight:      213.6 pounds Temp:     97.4 degrees F oral Pulse rate:   82 / minute BP sitting:   124 / 70  (left arm) Cuff size:   regular  Pt. in pain?   no  Vitals Entered By: Orlan Leavens (July 04, 2008 1:12 PM)                  Chief Complaint:  6 week follow-up.  History of Present Illness: now takes levemir 45 units two times a day.  cbg's range 63-400. it is generally high later in the day. pt states few weeks intermittent dysuria, and assoc slight low-back pain.    Current Allergies: ! HYDROCODONE ! * LEVBID ! * DESIPAMINE ! AMOXICILLIN  Past Medical History:    Reviewed history from 05/16/2008 and no changes required:       Fibromyalgia       IBS       MVP       DM Retinopathy       ACUTE CYSTITIS (ICD-595.0)       ANTIHYPERLIPIDEMIC USE, LONG TERM (ICD-V58.69)       UNSPECIFIED OSTEOPOROSIS (ICD-733.00)       HYPERTENSION (ICD-401.9)       HYPERLIPIDEMIA (ICD-272.4)       DIABETES MELLITUS, TYPE I (ICD-250.01)       DEPRESSION (ICD-311)     Review of Systems  The patient denies syncope and fever.     Physical Exam  General:     well developed, well nourished, in no acute distress Psych:     alert and cooperative; normal mood and affect; normal attention span and concentration    Impression & Recommendations:  Problem # 1:  DIABETES MELLITUS, TYPE I (ICD-250.01)  Problem # 2:  ACUTE CYSTITIS (ICD-595.0)   Patient Instructions: 1)  change levemir to 60 units am and 30 units pm 2)  urine c/s 3)  ret 21d   ] Laboratory Results   Urine Tests    Routine Urinalysis   Color: lt. yellow Appearance: Clear Glucose: 500   (Normal Range: Negative) Bilirubin: negative   (Normal Range: Negative) Ketone: negative   (Normal Range: Negative) Spec. Gravity: <1.005   (Normal Range: 1.003-1.035) Blood: trace-intact   (Normal Range: Negative) pH: 5.0    (Normal Range: 5.0-8.0) Protein: negative   (Normal Range: Negative) Urobilinogen: 0.2   (Normal Range: 0-1) Nitrite: negative   (Normal Range: Negative) Leukocyte Esterace: moderate   (Normal Range: Negative)

## 2010-11-26 NOTE — Letter (Signed)
Summary: [facility]   Imported By: Sherian Rein 01/07/2010 11:55:55  _____________________________________________________________________  External Attachment:    Type:   Image     Comment:   External Document

## 2010-12-02 ENCOUNTER — Ambulatory Visit: Payer: Medicare Other | Attending: Orthopedic Surgery | Admitting: Physical Therapy

## 2010-12-02 DIAGNOSIS — M25669 Stiffness of unspecified knee, not elsewhere classified: Secondary | ICD-10-CM | POA: Insufficient documentation

## 2010-12-02 DIAGNOSIS — M25569 Pain in unspecified knee: Secondary | ICD-10-CM | POA: Insufficient documentation

## 2010-12-02 DIAGNOSIS — R5381 Other malaise: Secondary | ICD-10-CM | POA: Insufficient documentation

## 2010-12-02 DIAGNOSIS — IMO0001 Reserved for inherently not codable concepts without codable children: Secondary | ICD-10-CM | POA: Insufficient documentation

## 2010-12-02 DIAGNOSIS — R269 Unspecified abnormalities of gait and mobility: Secondary | ICD-10-CM | POA: Insufficient documentation

## 2010-12-07 ENCOUNTER — Ambulatory Visit: Payer: Medicare Other | Admitting: Physical Therapy

## 2010-12-10 ENCOUNTER — Ambulatory Visit: Payer: Medicare Other | Admitting: Physical Therapy

## 2011-01-05 LAB — POCT I-STAT 4, (NA,K, GLUC, HGB,HCT)
Glucose, Bld: 184 mg/dL — ABNORMAL HIGH (ref 70–99)
HCT: 37 % (ref 36.0–46.0)
Hemoglobin: 12.6 g/dL (ref 12.0–15.0)

## 2011-01-05 LAB — GLUCOSE, CAPILLARY: Glucose-Capillary: 169 mg/dL — ABNORMAL HIGH (ref 70–99)

## 2011-01-07 LAB — POCT I-STAT 4, (NA,K, GLUC, HGB,HCT)
Hemoglobin: 14.3 g/dL (ref 12.0–15.0)
Potassium: 4.6 mEq/L (ref 3.5–5.1)
Sodium: 135 mEq/L (ref 135–145)

## 2011-01-31 LAB — URINALYSIS, ROUTINE W REFLEX MICROSCOPIC
Glucose, UA: NEGATIVE mg/dL
Hgb urine dipstick: NEGATIVE
Specific Gravity, Urine: 1.005 (ref 1.005–1.030)
Urobilinogen, UA: 0.2 mg/dL (ref 0.0–1.0)
pH: 6.5 (ref 5.0–8.0)

## 2011-01-31 LAB — BASIC METABOLIC PANEL
CO2: 33 mEq/L — ABNORMAL HIGH (ref 19–32)
Calcium: 8.8 mg/dL (ref 8.4–10.5)
Chloride: 101 mEq/L (ref 96–112)
GFR calc Af Amer: >60 mL/min (ref >60)
Potassium: 3 mEq/L — ABNORMAL LOW (ref 3.5–5.1)
Sodium: 138 mEq/L (ref 135–145)

## 2011-01-31 LAB — DIFFERENTIAL
Basophils Absolute: 0 10*3/uL (ref 0.0–0.1)
Eosinophils Absolute: 0.3 10*3/uL (ref 0.0–0.7)
Eosinophils Relative: 4 % (ref 0–5)
Lymphocytes Relative: 28 % (ref 12–46)

## 2011-01-31 LAB — CBC
HCT: 40.3 % (ref 36.0–46.0)
Hemoglobin: 13.6 g/dL (ref 12.0–15.0)
MCV: 93 fL (ref 78.0–100.0)
RBC: 4.34 MIL/uL (ref 3.87–5.11)
WBC: 8.6 10*3/uL (ref 4.0–10.5)

## 2011-01-31 LAB — COMPREHENSIVE METABOLIC PANEL
ALT: 24 U/L (ref 0–35)
AST: 39 U/L — ABNORMAL HIGH (ref 0–37)
CO2: 28 mEq/L (ref 19–32)
Chloride: 98 mEq/L (ref 96–112)
Creatinine, Ser: 1.3 mg/dL — ABNORMAL HIGH (ref 0.4–1.2)
GFR calc Af Amer: 49 mL/min — ABNORMAL LOW (ref >60)
GFR calc non Af Amer: 41 mL/min — ABNORMAL LOW (ref >60)
Glucose, Bld: 203 mg/dL — ABNORMAL HIGH (ref 70–99)
Total Bilirubin: 0.9 mg/dL (ref 0.3–1.2)

## 2011-01-31 LAB — GLUCOSE, CAPILLARY
Glucose-Capillary: 69 mg/dL — ABNORMAL LOW (ref 70–99)
Glucose-Capillary: 71 mg/dL (ref 70–99)

## 2011-01-31 LAB — LIPASE, BLOOD: Lipase: 12 U/L (ref 11–59)

## 2011-02-01 LAB — CBC
HCT: 30.9 % — ABNORMAL LOW (ref 36.0–46.0)
HCT: 35.2 % — ABNORMAL LOW (ref 36.0–46.0)
Hemoglobin: 10.5 g/dL — ABNORMAL LOW (ref 12.0–15.0)
Hemoglobin: 11.9 g/dL — ABNORMAL LOW (ref 12.0–15.0)
Hemoglobin: 12.3 g/dL (ref 12.0–15.0)
MCHC: 34 g/dL (ref 30.0–36.0)
MCV: 93.7 fL (ref 78.0–100.0)
MCV: 94.3 fL (ref 78.0–100.0)
Platelets: 140 10*3/uL — ABNORMAL LOW (ref 150–400)
Platelets: 154 10*3/uL (ref 150–400)
Platelets: 163 10*3/uL (ref 150–400)
Platelets: 196 10*3/uL (ref 150–400)
RBC: 3.35 MIL/uL — ABNORMAL LOW (ref 3.87–5.11)
RBC: 3.76 MIL/uL — ABNORMAL LOW (ref 3.87–5.11)
RBC: 3.86 MIL/uL — ABNORMAL LOW (ref 3.87–5.11)
RDW: 14.6 % (ref 11.5–15.5)
RDW: 14.9 % (ref 11.5–15.5)
RDW: 15 % (ref 11.5–15.5)
WBC: 7.9 10*3/uL (ref 4.0–10.5)
WBC: 9.1 10*3/uL (ref 4.0–10.5)

## 2011-02-01 LAB — COMPREHENSIVE METABOLIC PANEL
ALT: 28 U/L (ref 0–35)
ALT: 37 U/L — ABNORMAL HIGH (ref 0–35)
AST: 27 U/L (ref 0–37)
Albumin: 3.4 g/dL — ABNORMAL LOW (ref 3.5–5.2)
Alkaline Phosphatase: 66 U/L (ref 39–117)
Alkaline Phosphatase: 73 U/L (ref 39–117)
CO2: 32 mEq/L (ref 19–32)
GFR calc Af Amer: >60 mL/min (ref >60)
GFR calc non Af Amer: 51 mL/min — ABNORMAL LOW (ref >60)
Glucose, Bld: 296 mg/dL — ABNORMAL HIGH (ref 70–99)
Potassium: 3.2 mEq/L — ABNORMAL LOW (ref 3.5–5.1)
Potassium: 3.4 mEq/L — ABNORMAL LOW (ref 3.5–5.1)
Sodium: 134 mEq/L — ABNORMAL LOW (ref 135–145)
Sodium: 136 mEq/L (ref 135–145)
Total Protein: 5.9 g/dL — ABNORMAL LOW (ref 6.0–8.3)
Total Protein: 6.4 g/dL (ref 6.0–8.3)

## 2011-02-01 LAB — URINALYSIS, ROUTINE W REFLEX MICROSCOPIC
Ketones, ur: NEGATIVE mg/dL
Nitrite: NEGATIVE
Protein, ur: NEGATIVE mg/dL
Urobilinogen, UA: 0.2 mg/dL (ref 0.0–1.0)

## 2011-02-01 LAB — GLUCOSE, CAPILLARY
Glucose-Capillary: 122 mg/dL — ABNORMAL HIGH (ref 70–99)
Glucose-Capillary: 127 mg/dL — ABNORMAL HIGH (ref 70–99)
Glucose-Capillary: 150 mg/dL — ABNORMAL HIGH (ref 70–99)
Glucose-Capillary: 152 mg/dL — ABNORMAL HIGH (ref 70–99)
Glucose-Capillary: 153 mg/dL — ABNORMAL HIGH (ref 70–99)
Glucose-Capillary: 170 mg/dL — ABNORMAL HIGH (ref 70–99)
Glucose-Capillary: 177 mg/dL — ABNORMAL HIGH (ref 70–99)
Glucose-Capillary: 193 mg/dL — ABNORMAL HIGH (ref 70–99)
Glucose-Capillary: 197 mg/dL — ABNORMAL HIGH (ref 70–99)
Glucose-Capillary: 202 mg/dL — ABNORMAL HIGH (ref 70–99)
Glucose-Capillary: 212 mg/dL — ABNORMAL HIGH (ref 70–99)
Glucose-Capillary: 218 mg/dL — ABNORMAL HIGH (ref 70–99)
Glucose-Capillary: 251 mg/dL — ABNORMAL HIGH (ref 70–99)
Glucose-Capillary: 280 mg/dL — ABNORMAL HIGH (ref 70–99)
Glucose-Capillary: 284 mg/dL — ABNORMAL HIGH (ref 70–99)
Glucose-Capillary: 305 mg/dL — ABNORMAL HIGH (ref 70–99)
Glucose-Capillary: 68 mg/dL — ABNORMAL LOW (ref 70–99)
Glucose-Capillary: 69 mg/dL — ABNORMAL LOW (ref 70–99)
Glucose-Capillary: 76 mg/dL (ref 70–99)
Glucose-Capillary: 78 mg/dL (ref 70–99)
Glucose-Capillary: 81 mg/dL (ref 70–99)
Glucose-Capillary: 82 mg/dL (ref 70–99)
Glucose-Capillary: 88 mg/dL (ref 70–99)
Glucose-Capillary: 88 mg/dL (ref 70–99)
Glucose-Capillary: 95 mg/dL (ref 70–99)
Glucose-Capillary: 98 mg/dL (ref 70–99)

## 2011-02-01 LAB — BASIC METABOLIC PANEL
BUN: 10 mg/dL (ref 6–23)
BUN: 15 mg/dL (ref 6–23)
BUN: 22 mg/dL (ref 6–23)
CO2: 31 mEq/L (ref 19–32)
CO2: 32 mEq/L (ref 19–32)
Calcium: 8.7 mg/dL (ref 8.4–10.5)
Calcium: 9.1 mg/dL (ref 8.4–10.5)
Chloride: 100 mEq/L (ref 96–112)
Chloride: 102 mEq/L (ref 96–112)
Chloride: 106 mEq/L (ref 96–112)
Chloride: 99 mEq/L (ref 96–112)
Creatinine, Ser: 1.04 mg/dL (ref 0.4–1.2)
Creatinine, Ser: 1.14 mg/dL (ref 0.4–1.2)
GFR calc Af Amer: >60 mL/min (ref >60)
GFR calc Af Amer: >60 mL/min (ref >60)
GFR calc non Af Amer: 47 mL/min — ABNORMAL LOW (ref >60)
GFR calc non Af Amer: 52 mL/min — ABNORMAL LOW (ref >60)
GFR calc non Af Amer: 53 mL/min — ABNORMAL LOW (ref >60)
Glucose, Bld: 172 mg/dL — ABNORMAL HIGH (ref 70–99)
Glucose, Bld: 260 mg/dL — ABNORMAL HIGH (ref 70–99)
Glucose, Bld: 291 mg/dL — ABNORMAL HIGH (ref 70–99)
Potassium: 3.6 mEq/L (ref 3.5–5.1)
Potassium: 3.7 mEq/L (ref 3.5–5.1)
Potassium: 3.8 mEq/L (ref 3.5–5.1)
Sodium: 136 mEq/L (ref 135–145)
Sodium: 137 mEq/L (ref 135–145)
Sodium: 138 mEq/L (ref 135–145)

## 2011-02-01 LAB — DIFFERENTIAL
Basophils Absolute: 0.1 10*3/uL (ref 0.0–0.1)
Basophils Relative: 0 % (ref 0–1)
Eosinophils Absolute: 0.4 10*3/uL (ref 0.0–0.7)
Eosinophils Relative: 4 % (ref 0–5)
Monocytes Absolute: 0.7 10*3/uL (ref 0.1–1.0)
Monocytes Relative: 7 % (ref 3–12)
Monocytes Relative: 7 % (ref 3–12)
Neutrophils Relative %: 62 % (ref 43–77)

## 2011-02-01 LAB — POCT I-STAT, CHEM 8
BUN: 23 mg/dL (ref 6–23)
Calcium, Ion: 1.12 mmol/L (ref 1.12–1.32)
Chloride: 91 mEq/L — ABNORMAL LOW (ref 96–112)
Creatinine, Ser: 1.2 mg/dL (ref 0.4–1.2)
Glucose, Bld: 288 mg/dL — ABNORMAL HIGH (ref 70–99)
TCO2: 31 mmol/L (ref 0–100)

## 2011-02-01 LAB — URINE CULTURE
Colony Count: NO GROWTH
Culture: NO GROWTH

## 2011-02-01 LAB — CULTURE, BLOOD (ROUTINE X 2): Culture: NO GROWTH

## 2011-02-01 LAB — PROTIME-INR: INR: 1 (ref 0.00–1.49)

## 2011-02-03 ENCOUNTER — Other Ambulatory Visit (HOSPITAL_COMMUNITY)

## 2011-02-05 ENCOUNTER — Other Ambulatory Visit: Payer: Self-pay | Admitting: Orthopedic Surgery

## 2011-02-05 ENCOUNTER — Encounter (HOSPITAL_COMMUNITY): Payer: Medicare Other

## 2011-02-05 DIAGNOSIS — Z01812 Encounter for preprocedural laboratory examination: Secondary | ICD-10-CM | POA: Insufficient documentation

## 2011-02-05 DIAGNOSIS — Z01818 Encounter for other preprocedural examination: Secondary | ICD-10-CM | POA: Insufficient documentation

## 2011-02-05 LAB — BASIC METABOLIC PANEL
BUN: 24 mg/dL — ABNORMAL HIGH (ref 6–23)
CO2: 28 mEq/L (ref 19–32)
Calcium: 9.3 mg/dL (ref 8.4–10.5)
Creatinine, Ser: 1.28 mg/dL — ABNORMAL HIGH (ref 0.4–1.2)
GFR calc Af Amer: 50 mL/min — ABNORMAL LOW (ref >60)

## 2011-02-05 LAB — ABO/RH: ABO/RH(D): A POS

## 2011-02-05 LAB — TYPE AND SCREEN
ABO/RH(D): A POS
Antibody Screen: NEGATIVE

## 2011-02-05 LAB — CBC
MCH: 31.2 pg (ref 26.0–34.0)
MCHC: 35 g/dL (ref 30.0–36.0)
MCV: 89.1 fL (ref 78.0–100.0)
Platelets: 182 10*3/uL (ref 150–400)

## 2011-02-05 LAB — SURGICAL PCR SCREEN: MRSA, PCR: NEGATIVE

## 2011-02-09 ENCOUNTER — Inpatient Hospital Stay (HOSPITAL_COMMUNITY)
Admission: RE | Admit: 2011-02-09 | Discharge: 2011-02-15 | DRG: 470 | Disposition: A | Discharge status: Skilled Nursing Facility | Payer: Medicare Other | Source: Ambulatory Visit | Attending: Orthopedic Surgery | Admitting: Orthopedic Surgery

## 2011-02-09 ENCOUNTER — Inpatient Hospital Stay (HOSPITAL_COMMUNITY): Payer: Medicare Other

## 2011-02-09 DIAGNOSIS — D649 Anemia, unspecified: Secondary | ICD-10-CM | POA: Diagnosis not present

## 2011-02-09 DIAGNOSIS — Z01818 Encounter for other preprocedural examination: Secondary | ICD-10-CM

## 2011-02-09 DIAGNOSIS — I10 Essential (primary) hypertension: Secondary | ICD-10-CM | POA: Diagnosis present

## 2011-02-09 DIAGNOSIS — Z01812 Encounter for preprocedural laboratory examination: Secondary | ICD-10-CM

## 2011-02-09 DIAGNOSIS — M1710 Unilateral primary osteoarthritis, unspecified knee: Principal | ICD-10-CM | POA: Diagnosis present

## 2011-02-09 DIAGNOSIS — F329 Major depressive disorder, single episode, unspecified: Secondary | ICD-10-CM | POA: Diagnosis present

## 2011-02-09 DIAGNOSIS — F3289 Other specified depressive episodes: Secondary | ICD-10-CM | POA: Diagnosis present

## 2011-02-09 DIAGNOSIS — E119 Type 2 diabetes mellitus without complications: Secondary | ICD-10-CM | POA: Diagnosis present

## 2011-02-09 DIAGNOSIS — F99 Mental disorder, not otherwise specified: Secondary | ICD-10-CM | POA: Diagnosis not present

## 2011-02-09 LAB — GLUCOSE, CAPILLARY: Glucose-Capillary: 239 mg/dL — ABNORMAL HIGH (ref 70–99)

## 2011-02-10 LAB — GLUCOSE, CAPILLARY
Glucose-Capillary: 353 mg/dL — ABNORMAL HIGH (ref 70–99)
Glucose-Capillary: 345 mg/dL — ABNORMAL HIGH (ref 70–99)
Glucose-Capillary: 113 mg/dL — ABNORMAL HIGH (ref 70–99)

## 2011-02-10 LAB — BASIC METABOLIC PANEL
CO2: 29 mEq/L (ref 19–32)
Chloride: 98 mEq/L (ref 96–112)
GFR calc Af Amer: 56 mL/min — ABNORMAL LOW (ref >60)
Potassium: 4.8 mEq/L (ref 3.5–5.1)
Sodium: 132 mEq/L — ABNORMAL LOW (ref 135–145)

## 2011-02-10 LAB — CBC
Hemoglobin: 11 g/dL — ABNORMAL LOW (ref 12.0–15.0)
MCH: 30.1 pg (ref 26.0–34.0)
Platelets: 140 10*3/uL — ABNORMAL LOW (ref 150–400)
RBC: 3.66 MIL/uL — ABNORMAL LOW (ref 3.87–5.11)
WBC: 8.6 10*3/uL (ref 4.0–10.5)

## 2011-02-10 LAB — PROTIME-INR
INR: 1.06 (ref 0.00–1.49)
Prothrombin Time: 14 seconds (ref 11.6–15.2)

## 2011-02-10 NOTE — Op Note (Signed)
[name], [name]                 ACCOUNT NO.:  [id number]  MEDICAL RECORD NO.:  [id number]           PATIENT TYPE:  I  LOCATION:  1612                         FACILITY:  [facility]  PHYSICIAN:  Marlowe Kays, M.D.  DATE OF BIRTH:  [birth year]  DATE OF PROCEDURE:  02/09/2011 DATE OF DISCHARGE:                              OPERATIVE REPORT   PREOPERATIVE DIAGNOSIS:  Osteoarthritis, left knee.  POSTOPERATIVE DIAGNOSIS:  Osteoarthritis, left knee.  OPERATION:  Osteonics total knee replacement, left.  SURGEON:  Marlowe Kays, MD  ASSISTANT:  Druscilla Brownie. Idolina Primer, [facility]  ANESTHESIA:  General.  JUSTIFICATION FOR PROCEDURE:  She has had several upper arthroscopic procedures on the left knee and has been trying viscosupplementation without relief of symptoms.  At surgery, she was found to have full- thickness wear through the medial femoral condyle.  PROCEDURE IN DETAIL:  Prophylactic antibiotics, satisfied general anesthesia with latex precautions, Foley catheter was inserted. Pneumatic tourniquet applied followed by SureFoot and lateral hip stabilizer.  Left leg was prepped with DuraPrep from tourniquet to ankle, draped in sterile field.  Time-out performed.  Ioban employed. Leg was Esmarched out sterilely and tourniquet inflated to 325 mmHg. Vertical midline incision down to the patellar mechanism with median parapatellar incision to open the joint.  Pes anserinus and medial collateral ligament were elevated off the tibia.  The patellar mechanism was freed up and the patella everted and knee flexed.  Remnants of the menisci and ACL anteriorly were removed.  Pathology was noted.  I made a 5/16-inch drill hole in the distal femur followed by canal finder and the axis liner for a 10 mm distal femoral valgus cut.  I then sized the femur at #5 using the cradle apparatus.  Scribe lines were placed on the distal femur and then I placed the distal femoral cutting jig to make anterior and  posterior cuts and posterior and anterior chamferings.  We then went to the tibia where after making a leveling cut incising for #5, I made my first intramedullary drill hole followed by step-cut drill, canal finder and the internal intramedullary rod set for a 4-mm cut at 90 degrees of the depressed medial tibial plateau.  The cut was made and we then placed the lamina spreader to remove remnants of bone and soft tissue from behind femoral condyles.  I then placed the femoral jig for creating both the patellar groove and the notchplasty.  After these had been accomplished, we went through a trial reduction and found at least a 10 mm space would be adequate.  Using the external rod splitting bimalleolar distance, I made scribe lines on the anterior tibia.  While the knee was in extension, we sized the patella to 26 and placed the patellar cutting jig for 10 mm recess cut followed by the jig to create the fixation drill holes.  I then placed a trial patella and trimmed away bone from around the perimeter.  Returning to the tibia, I stabilized the base plate with 3 pins based on the previous scribe lines and then using the tripod apparatus we reamed up to a 5 cemented.  We then water picked the knee while the methacrylate was being mixed after the components had been opened up.  We then glued in the individual components, starting first with a tibia impacting and removing excess methacrylate.  The same thing was then drew with the femur.  We then held the knee in full extension with 10 mm spacer while we glued in the patella holding the patella with holding clamp.  When the methacrylate hardened, we checked around all the components and removed small particles of methyl methacrylate and then went through another trial reduction and found that 12 mm spacer actually seemed to give better with the knee going in full extension with good stability.  No lateral release of patella was required.  I  placed Gelfoam into the intramedullary drill hole and into the soft tissues with 0.5% Marcaine with adrenaline.  The final size five 12 mm thickness posterior stabilized Scorpio insert was placed, the knee reduced, and found to be nice and stable.  We then released the tourniquet, there was no unusual bleeding.  Hemovac was placed.  The wound was then closed in layers with interrupted #1 Vicryl and 2 layers in the quadriceps tendon distally and synovium capsule, 2-0 Vicryl in subcutaneous tissue, staples in the skin.  Betadine and dry sterile dressing were applied followed by a knee immobilizer and cold pack.  She tolerated the procedure well and was taken to the recovery room in satisfactory condition with no known complications and minimal blood loss.          ______________________________ Marlowe Kays, M.D.     JA/MEDQ  D:  02/09/2011  T:  02/10/2011  Job:  [id number]  Electronically Signed by Marlowe Kays M.D. on 02/10/2011 07:36:52 AM

## 2011-02-10 NOTE — H&P (Signed)
NAMEFRANK, PILGER                 ACCOUNT NO.:  [id number]  MEDICAL RECORD NO.:  [id number]           PATIENT TYPE:  LOCATION:                                 FACILITY:  PHYSICIAN:  Marlowe Kays, M.D.  DATE OF BIRTH:  Nov 11, 1940  DATE OF ADMISSION:  02/09/2011 DATE OF DISCHARGE:                             HISTORY & PHYSICAL   CHIEF COMPLAINT:  "Pain in my left knee."  HISTORY OF PRESENT ILLNESS:  This is a 70 year old white female who has been seen by [place] for continuing progressive problems concerning her left knee.  She has had viscosupplementation into the left knee as well as left knee arthroscopy with debridement and with only short-term results. She has well-documented osteoarthritis of the knee with near bone-on- bone deformity.  She has tried to put off surgery as much as she can, but she is now to the point where it is markedly interfering with her day-to-day activities.  She is really not an active lady and this is causing her quite a bit of distress and even to the point of affecting her overall health.  After much discussion including the risks and benefits of surgery, we decided to go ahead with total knee replacement arthroplasty to the left knee.  Dr. Simonne Come has explained to her all the risks and benefits of surgery.  PAST MEDICAL HISTORY:  The patient's current medical problems again being handled by Dr. Kyra Manges.  He has been treating her for hypertension, shortness of breath, allergies, urine problems with frequent infections, incontinency, and diabetes.  CURRENT MEDICATIONS: 1. Novolin 70/30, 52 units at breakfast and 44 at bedtime. 2. Toprol 25 mg one-half tablet daily. 3. Trazodone 150 mg daily. 4. Lexapro 20 mg 2 at breakfast. 5. Cymbalta 60 mg 1 at breakfast. 6. Furosemide 40 mg 1 at breakfast. 7. K-Dur 20 mEq 1 at breakfast. 8. Lipitor 20 mg 1 in the evening. 9. Nexium 40 mg daily. 10.Macrodantin 100 mg daily. 11.Enablex 15 mg  daily. 12.Cholestyramine powder in the morning. 13.Pennsaid 1.5% p.r.n. 14.Tramadol 50 mg p.r.n. 15.Aspirin 81 mg (we will stop prior to surgery). 16.Multivitamin, calcium, magnesium, fish oil, Estrace cream, desonide     ointment, and Lotemax eye drops, she will stop all those.  OTHER MEDICAL PROBLEMS: 1. Irritable bowel syndrome. 2. Depression. 3. Hypercholesterolemia. 4. Mitral valve prolapse. 5. Vulvar lichen planus with sclerosis.  DRUG ALLERGIES: 1. PREDNISONE. 2. CODEINE. 3. CORTISONE. 4. FLUTICASONE. 5. BEXTRA. 6. DARVOCET. 7. CELEBREX. 8. NORVASC. 9. BACTRIM. 10.SULFA. 11.MORPHINE. 12.VICODIN. 13.AMBIEN. 14.VICOPROFEN 15.TALWIN NX .  I discussed what type of medication to be used postoperatively, Minahil thinks that she will be able to use Vicodin for postop.  We may use that and then some IV Dilaudid.  PAST SURGICAL HISTORY:  Bilateral lens extractions and implants, colonoscopy and endoscopy in March 2011, stress test in July 2007, a knee arthroscopy as mentioned above.  FAMILY HISTORY:  Positive for diabetes, heart disease, and prostate cancer in the father and diabetes, kidney failure in the mother, both passed.  SOCIAL HISTORY:  The patient is widow.  She is a retired Catering manager  and never intake of tobacco products and no alcohol intake as well.  She lives alone, but her son will help her postoperatively.  REVIEW OF SYSTEMS:  CNS:  No seizure disorder, paralysis, numbness, double vision, but she does have depression.  CARDIOVASCULAR:  No chest pains.  No angina.  No orthopnea.  RESPIRATORY:  No productive cough. No hemoptysis.  No shortness of breath.  GASTROINTESTINAL:  No nausea, vomiting, melena, or no bloody stool.  GENITOURINARY:  No discharge, dysuria, hematuria, but she does have frequent urinary tract infection, but is under control with Bactrim.  PHYSICAL EXAMINATION:  GENERAL:  Alert, cooperative, friendly 70 year old white female. VITAL  SIGNS:  Blood pressure 118/74, left arm seated; respirations 12 and nonlabored; pulse 76 and regular. HEENT:  Normocephalic.  PERRLA.  Oropharynx is clear. CHEST:  Clear to auscultation.  No rhonchi.  No rales. HEART:  Regular rate and rhythm.  No murmurs are heard. ABDOMEN: Soft, nontender.  Liver, spleen not felt. GENITALIA/RECTAL/PELVIC/BREASTS:  Not done, not pertinent to present illness. EXTREMITIES:  The patient has pain and crepitus range of motion, left knee. SKIN:  Clear.  No erythematous and no effusion.  ADMISSION DIAGNOSES: 1. End-stage osteoarthritis, left knee. 2. Hypertension. 3. Irritable bowel syndrome. 4. Depression. 5. Diabetes. 6. History of frequent urinary tract infections.  PLAN:  The patient will be admitted for total knee replacement arthroplasty to the left knee.  At this point in time, she plans to go home after regular hospitalization with home health.  We will evaluate her in the hospital, decide if indeed she will need by mutual consent, more intensive rehabilitation, and we will make arrangements with that as well.  All questions encouraged and answered today.     Dooley L. Cherlynn June.   ______________________________ Marlowe Kays, M.D.    DLU/MEDQ  D:  02/03/2011  T:  02/04/2011  Job:  [id number]  cc:   Dr. Kyra Manges  Electronically Signed by Marlowe Kays M.D. on 02/10/2011 07:36:48 AM

## 2011-02-11 LAB — GLUCOSE, CAPILLARY
Glucose-Capillary: 256 mg/dL — ABNORMAL HIGH (ref 70–99)
Glucose-Capillary: 298 mg/dL — ABNORMAL HIGH (ref 70–99)

## 2011-02-11 LAB — CBC
HCT: 27.7 % — ABNORMAL LOW (ref 36.0–46.0)
MCV: 87.9 fL (ref 78.0–100.0)
Platelets: 124 10*3/uL — ABNORMAL LOW (ref 150–400)
RBC: 3.15 MIL/uL — ABNORMAL LOW (ref 3.87–5.11)
RDW: 13.3 % (ref 11.5–15.5)
WBC: 9.6 10*3/uL (ref 4.0–10.5)

## 2011-02-11 LAB — BASIC METABOLIC PANEL
CO2: 31 mEq/L (ref 19–32)
Calcium: 8 mg/dL — ABNORMAL LOW (ref 8.4–10.5)
Chloride: 94 mEq/L — ABNORMAL LOW (ref 96–112)
Creatinine, Ser: 1.06 mg/dL (ref 0.4–1.2)
GFR calc Af Amer: >60 mL/min (ref >60)
Sodium: 131 mEq/L — ABNORMAL LOW (ref 135–145)

## 2011-02-11 LAB — PROTIME-INR: INR: 2.44 — ABNORMAL HIGH (ref 0.00–1.49)

## 2011-02-12 LAB — GLUCOSE, CAPILLARY
Glucose-Capillary: 338 mg/dL — ABNORMAL HIGH (ref 70–99)
Glucose-Capillary: 248 mg/dL — ABNORMAL HIGH (ref 70–99)

## 2011-02-12 LAB — CBC
HCT: 25.8 % — ABNORMAL LOW (ref 36.0–46.0)
MCHC: 34.5 g/dL (ref 30.0–36.0)
MCV: 87.5 fL (ref 78.0–100.0)
Platelets: 138 10*3/uL — ABNORMAL LOW (ref 150–400)
RDW: 13.3 % (ref 11.5–15.5)
WBC: 9.1 10*3/uL (ref 4.0–10.5)

## 2011-02-13 LAB — GLUCOSE, CAPILLARY: Glucose-Capillary: 267 mg/dL — ABNORMAL HIGH (ref 70–99)

## 2011-02-14 LAB — GLUCOSE, CAPILLARY
Glucose-Capillary: 193 mg/dL — ABNORMAL HIGH (ref 70–99)
Glucose-Capillary: 226 mg/dL — ABNORMAL HIGH (ref 70–99)

## 2011-02-15 ENCOUNTER — Inpatient Hospital Stay
Admission: RE | Admit: 2011-02-15 | Discharge: 2011-03-19 | Disposition: A | Discharge status: Home / Self Care | Source: Ambulatory Visit | Attending: Internal Medicine | Admitting: Internal Medicine

## 2011-02-15 DIAGNOSIS — I82409 Acute embolism and thrombosis of unspecified deep veins of unspecified lower extremity: Principal | ICD-10-CM

## 2011-02-15 DIAGNOSIS — W19XXXA Unspecified fall, initial encounter: Secondary | ICD-10-CM

## 2011-02-15 LAB — PROTIME-INR: Prothrombin Time: 20.2 seconds — ABNORMAL HIGH (ref 11.6–15.2)

## 2011-02-16 ENCOUNTER — Ambulatory Visit: Admitting: Cardiology

## 2011-02-16 LAB — GLUCOSE, CAPILLARY
Glucose-Capillary: 257 mg/dL — ABNORMAL HIGH (ref 70–99)
Glucose-Capillary: 285 mg/dL — ABNORMAL HIGH (ref 70–99)

## 2011-02-17 ENCOUNTER — Ambulatory Visit (HOSPITAL_COMMUNITY): Payer: Medicare Other | Attending: Internal Medicine

## 2011-02-17 DIAGNOSIS — M7989 Other specified soft tissue disorders: Secondary | ICD-10-CM | POA: Insufficient documentation

## 2011-02-17 DIAGNOSIS — M79609 Pain in unspecified limb: Secondary | ICD-10-CM | POA: Insufficient documentation

## 2011-02-17 DIAGNOSIS — Z96659 Presence of unspecified artificial knee joint: Secondary | ICD-10-CM | POA: Insufficient documentation

## 2011-02-17 LAB — GLUCOSE, CAPILLARY
Glucose-Capillary: 249 mg/dL — ABNORMAL HIGH (ref 70–99)
Glucose-Capillary: 307 mg/dL — ABNORMAL HIGH (ref 70–99)
Glucose-Capillary: 313 mg/dL — ABNORMAL HIGH (ref 70–99)
Glucose-Capillary: 206 mg/dL — ABNORMAL HIGH (ref 70–99)

## 2011-02-18 LAB — URINE CULTURE
Colony Count: >=100000
Culture  Setup Time: 201204231114

## 2011-02-18 LAB — GLUCOSE, CAPILLARY: Glucose-Capillary: 336 mg/dL — ABNORMAL HIGH (ref 70–99)

## 2011-02-19 LAB — GLUCOSE, CAPILLARY
Glucose-Capillary: 253 mg/dL — ABNORMAL HIGH (ref 70–99)
Glucose-Capillary: 174 mg/dL — ABNORMAL HIGH (ref 70–99)

## 2011-02-20 LAB — GLUCOSE, CAPILLARY
Glucose-Capillary: 221 mg/dL — ABNORMAL HIGH (ref 70–99)
Glucose-Capillary: 238 mg/dL — ABNORMAL HIGH (ref 70–99)
Glucose-Capillary: 251 mg/dL — ABNORMAL HIGH (ref 70–99)
Glucose-Capillary: 293 mg/dL — ABNORMAL HIGH (ref 70–99)

## 2011-02-21 LAB — GLUCOSE, CAPILLARY
Glucose-Capillary: 221 mg/dL — ABNORMAL HIGH (ref 70–99)
Glucose-Capillary: 189 mg/dL — ABNORMAL HIGH (ref 70–99)

## 2011-02-22 LAB — GLUCOSE, CAPILLARY
Glucose-Capillary: 321 mg/dL — ABNORMAL HIGH (ref 70–99)
Glucose-Capillary: 260 mg/dL — ABNORMAL HIGH (ref 70–99)

## 2011-02-24 LAB — GLUCOSE, CAPILLARY: Glucose-Capillary: 129 mg/dL — ABNORMAL HIGH (ref 70–99)

## 2011-02-25 LAB — GLUCOSE, CAPILLARY
Glucose-Capillary: 120 mg/dL — ABNORMAL HIGH (ref 70–99)
Glucose-Capillary: 203 mg/dL — ABNORMAL HIGH (ref 70–99)

## 2011-02-25 NOTE — Discharge Summary (Signed)
  NAMEMARYSUE, [name]                 ACCOUNT NO.:  [id number]  MEDICAL RECORD NO.:  [id number]           PATIENT TYPE:  I  LOCATION:  1612                         FACILITY:  [facility]  PHYSICIAN:  Marlowe Kays, M.D.  DATE OF BIRTH:  [birth year]  DATE OF ADMISSION:  02/09/2011 DATE OF DISCHARGE:  02/15/2011                        DISCHARGE SUMMARY - REFERRING   ADMISSION DIAGNOSES: 1. Osteoarthritis, left knee. 2. History of urinary incontinence. 3. Hypertension. 4. Irritable bowel syndrome. 5. Depression.  DISCHARGE DIAGNOSES: 1. Osteoarthritis, left knee. 2. History of urinary incontinence. 3. Hypertension. 4. Irritable bowel syndrome. 5. Depression. 6. Postoperative anemia.  OPERATION:  Total knee replacement, left, on February 09, 2011.  SUMMARY:  I have followed this lady for many months.  She has had left knee arthroscopy, has end-stage arthritis in the left knee, and because of progressive pain is here today for total knee replacement surgery on her left knee.  Admission laboratory data was unremarkable.  ADMITTING MEDICATIONS:  She is admitted on numerous medications including biotin; Macrobid; Estrace; desonide; aspirin, which had been disconnected before surgery; vitamin B12; magnesium oxide; multivitamins; calcium carbonate; fish oil; atorvastatin; Toprol XL; trazodone; Enablex; Nexium; Lexapro; Lasix; potassium chloride; Cymbalta.  ALLERGIES:  She also has multiple allergies, which included: 1. AMOXICILLIN. 2. CODEINE. 3. LATEX. 4. CELEBREX. 5. TALWIN. 6. FLU VACCINE. 7. PREDNISONE. 8. CORTISONE. 9. VIOXX. 10.DARVOCET. 11.MORPHINE SULFATE. 12.AMBIEN.  HOSPITAL COURSE:  She underwent an uneventful knee replacement with postoperative films looking good.  Physical therapy was a little slow and she had some episodes of confusion.  Initially, plan was to discharge her home, but it was felt by our therapist here at [facility] that she needed more  rehabilitation time and at this time plan is being made for skilled nursing facility.  At the time of discharge today, her wound is healing nicely.  Her hemoglobin as 8.9, but it was not felt that she was having any significant symptoms of anemia.  She was afebrile.  She was having some ongoing urinary problems, which she felt had gotten worse and she asked to have a urine culture prior to discharge.  PLAN:  To discharge her on her admission medications as well as Ultram for pain and Coumadin daily based on her prothrombin time and INR.  Plan is to have her be weightbearing as tolerated on the left leg with physical therapy.  We will also work on knee motion and recheck her in my office in 2 weeks from surgery.  CONDITION ON DISCHARGE:  Stable.          ______________________________ Marlowe Kays, M.D.     JA/MEDQ  D:  02/15/2011  T:  02/15/2011  Job:  [id number]  Electronically Signed by Marlowe Kays M.D. on 02/25/2011 12:35:13 PM

## 2011-02-26 ENCOUNTER — Ambulatory Visit (HOSPITAL_COMMUNITY): Payer: Medicare Other | Attending: Internal Medicine

## 2011-02-26 DIAGNOSIS — M7730 Calcaneal spur, unspecified foot: Secondary | ICD-10-CM | POA: Insufficient documentation

## 2011-02-26 DIAGNOSIS — M810 Age-related osteoporosis without current pathological fracture: Secondary | ICD-10-CM | POA: Insufficient documentation

## 2011-02-26 LAB — GLUCOSE, CAPILLARY: Glucose-Capillary: 83 mg/dL (ref 70–99)

## 2011-02-27 LAB — GLUCOSE, CAPILLARY
Glucose-Capillary: 219 mg/dL — ABNORMAL HIGH (ref 70–99)
Glucose-Capillary: 184 mg/dL — ABNORMAL HIGH (ref 70–99)
Glucose-Capillary: 122 mg/dL — ABNORMAL HIGH (ref 70–99)
Glucose-Capillary: 81 mg/dL (ref 70–99)
Glucose-Capillary: 80 mg/dL (ref 70–99)

## 2011-02-28 LAB — GLUCOSE, CAPILLARY: Glucose-Capillary: 197 mg/dL — ABNORMAL HIGH (ref 70–99)

## 2011-03-01 LAB — GLUCOSE, CAPILLARY
Glucose-Capillary: 151 mg/dL — ABNORMAL HIGH (ref 70–99)
Glucose-Capillary: 189 mg/dL — ABNORMAL HIGH (ref 70–99)
Glucose-Capillary: 254 mg/dL — ABNORMAL HIGH (ref 70–99)

## 2011-03-02 LAB — GLUCOSE, CAPILLARY
Glucose-Capillary: 146 mg/dL — ABNORMAL HIGH (ref 70–99)
Glucose-Capillary: 236 mg/dL — ABNORMAL HIGH (ref 70–99)
Glucose-Capillary: 130 mg/dL — ABNORMAL HIGH (ref 70–99)

## 2011-03-03 LAB — GLUCOSE, CAPILLARY
Glucose-Capillary: 166 mg/dL — ABNORMAL HIGH (ref 70–99)
Glucose-Capillary: 111 mg/dL — ABNORMAL HIGH (ref 70–99)
Glucose-Capillary: 168 mg/dL — ABNORMAL HIGH (ref 70–99)

## 2011-03-04 LAB — GLUCOSE, CAPILLARY: Glucose-Capillary: 169 mg/dL — ABNORMAL HIGH (ref 70–99)

## 2011-03-05 LAB — GLUCOSE, CAPILLARY
Glucose-Capillary: 178 mg/dL — ABNORMAL HIGH (ref 70–99)
Glucose-Capillary: 218 mg/dL — ABNORMAL HIGH (ref 70–99)
Glucose-Capillary: 176 mg/dL — ABNORMAL HIGH (ref 70–99)

## 2011-03-06 LAB — GLUCOSE, CAPILLARY
Glucose-Capillary: 95 mg/dL (ref 70–99)
Glucose-Capillary: 96 mg/dL (ref 70–99)

## 2011-03-07 LAB — GLUCOSE, CAPILLARY: Glucose-Capillary: 131 mg/dL — ABNORMAL HIGH (ref 70–99)

## 2011-03-08 LAB — GLUCOSE, CAPILLARY
Glucose-Capillary: 105 mg/dL — ABNORMAL HIGH (ref 70–99)
Glucose-Capillary: 124 mg/dL — ABNORMAL HIGH (ref 70–99)

## 2011-03-09 LAB — GLUCOSE, CAPILLARY
Glucose-Capillary: 91 mg/dL (ref 70–99)
Glucose-Capillary: 240 mg/dL — ABNORMAL HIGH (ref 70–99)
Glucose-Capillary: 154 mg/dL — ABNORMAL HIGH (ref 70–99)

## 2011-03-09 NOTE — H&P (Signed)
[name], [name]                 ACCOUNT NO.:  [id number]   MEDICAL RECORD NO.:  [id number]          PATIENT TYPE:  EMS   LOCATION:  ED                           FACILITY:  [facility]   PHYSICIAN:  Georgina Quint. Plotnikov, MDDATE OF BIRTH:  [birth year]   DATE OF ADMISSION:  04/11/2009  DATE OF DISCHARGE:                              HISTORY & PHYSICAL   CHIEF COMPLAINT:  Concerned about methicillin-resistant Staphylococcus  aureus abscess.   HISTORY OF PRESENT ILLNESS:  The patient is a 70 year old female with  longstanding history of chronic groin problem, mostly with yeast  infection.  Over past several months with recurrent boils that grew out  MRSA infection.  She had boils in different parts of her body.  She  developed 2 boils in her groin area last weekend and was started on  doxycycline on Monday.  In spite of doxycycline, the boils have enlarged  especially the one on the left labia.  Worrying about MRSA  complications, she presented to [facility] ER where Dr. Adriana Simas started  her on IV vancomycin.  Obviously, she is complaining of pain in the  above area.  Denies chest pain, shortness of breath, syncope.   PAST MEDICAL HISTORY:  1. Insulin-dependent diabetes.  2. Obesity.  3. Depression.  4. Fibromyalgia.  5. Hypertension.  6. Irritable bowel syndrome.  7. Mitral valve prolapse.  8. Neuropathy.  9. Insomnia.  10.Chronic rash in the groin area.  11.Recurrent abscesses as above.   SOCIAL HISTORY:  Does not smoke or drink.   FAMILY HISTORY:  Positive for diabetes.   ALLERGIES:  1. AMBIEN.  2. AMOXICILLIN.  3. BEXTRA.  4. CELEBREX.  5. CODEINE.  6. CORTISONE 10.  7. DARVOCET-N 100.  8. NORVASC.  9. PREDNISONE.   CURRENT MEDICATIONS:  1. Levemir insulin 130 units a day.  2. Toprol XL 25 mg daily.  3. Trazodone 150 mg daily.  4. Lexapro 20 mg b.i.d.  5. Lasix 40 mg once a day.  6. Potassium 20 mEq once a day.  7. Lipitor 20 mg daily.  8. Mavik 1 mg daily.  9.  Ultram 50 mg t.i.d. p.r.n.  10.Nexium 40 mg b.i.d.  11.Aspirin 81 mg daily.  12.Multivitamin 1 a day.  13.Doxycycline 100 mg b.i.d. since Monday.  14.Calcium 600 b.i.d.  15.Fish oil 1000 b.i.d.   REVIEW OF SYSTEMS:  No chest pain or shortness of breath.  Chronic rash  in the groin.  Chronic vaginal discomfort.  IBS symptoms.  Fibromyalgia  symptoms.  Weight gain.  The rest of the 18-point review of systems is  negative.   PHYSICAL EXAMINATION:  VITAL SIGNS:  Blood pressure 117/57, heart rate  70, respirations 12, sat is 98% on room air, temperature 98.2.  GENERAL:  Overweight female in no acute distress.  HEENT:  Moist mucosa.  NECK:  Supple.  LUNGS:  Clear.  No wheezes.  HEART:  S1 and S2, slight tachycardia.  ABDOMEN:  Soft, nontender.  No organomegaly or mass felt.  LOWER EXTREMITIES:  With trace edema.  Calves nontender.  SKIN:  With erythema  in the groin area involving pubic area and  buttocks.  There is a small 1 cm abscess at the distal part of her left  buttock, tender.  There is a bigger 2.6 x 1.8 cm abscess in the left  labia without drainage.  No adenopathy.  Skin with aging changes.  NEURO:  She is alert, oriented and cooperative.   LABORATORY DATA:  White count 9.9, hemoglobin 13.2, sodium 136,  potassium 3.2, glucose 111, creatinine 1.24, BUN 28.   ASSESSMENT/PLAN:  1. Two abscesses in the groin area.  Vancomycin IV.  Surgical consult      with Dr. Daphine Deutscher.  2. Intertrigo, chronic.  We will use Lamisil topically.  3. Recurrent methicillin-resistant Staphylococcus aureus abscess of      several months' duration.  She may need Hibiclens wash and change      in hygiene approach.  4. Insulin-dependent diabetes on high dose of Levemir, insulin      resistant.  We will reduce dose to 100 units a day for now.  Her      CBG was 67 and she felt hypoglycemic.  We gave her quarter of D50      glucose amp.  5. Obesity.  6. Depression on therapy.  7. Hypertension on  therapy.  8. Chronic renal insufficiency.  We will recheck tomorrow.  9. Hypokalemia.  We will replace p.o.      Georgina Quint. Plotnikov, MD  Electronically Signed     AVP/MEDQ  D:  04/11/2009  T:  04/12/2009  Job:  [id number]   cc:   Gregary Signs A. Everardo All, MD  [address]  [place]  [place] [address]   Senaida Ores, MD

## 2011-03-09 NOTE — Discharge Summary (Signed)
[name], LAC                 ACCOUNT NO.:  [id number]   MEDICAL RECORD NO.:  [id number]          PATIENT TYPE:  INP   LOCATION:  1311                         FACILITY:  [facility]   PHYSICIAN:  Valerie A. Felicity Coyer, MDDATE OF BIRTH:  October 01, 1941   DATE OF ADMISSION:  04/11/2009  DATE OF DISCHARGE:  04/15/2009                               DISCHARGE SUMMARY   DISCHARGE DIAGNOSES:  1. Perianal methicillin-resistant Staphylococcus aureus abscess status      post IV vancomycin and general surgery consultation.  Continue oral      Doxycycline for treatment and suppression.  See details below.      Status post local drainage in the emergency room by Dr. Daphine Deutscher.      Outpatient followup with Dermatology.  2. Insulin resistant type 2 diabetes.  Continue medical management as      prior to admission.  3. Depression.  4. Hypertension.  5. Obesity.  6. Chronic renal insufficiency.  7. Hypokalemia, replaced a resolved.  8. History of dyslipidemia.  9. Fibromyalgia.  10.Irritable bowel.   DISCHARGE MEDICATIONS:  1. Include doxycycline 100 mg b.i.d. x3 weeks then one p.o. daily.      Length of treatment to be determined by primary care physician and      followup.  2. MS Contin 50 mg p.o. b.i.d. for pain and discontinue as pain      resolves.  3. Hibiclens 4% solution wash perianal area twice daily.  4. Clotrimazole topical 1% cream apply to groin daily.  5. Other medications are as prior to admission without change include      Levemir 130 units subcu daily.  6. Toprol XL 25 mg daily.  7. Trazodone 150 mg q.h.s.  8. Cipro 40 mg daily.  9. Lasix 40 mg daily.  10.Potassium chloride 20 mEq daily.  11.Lipitor 20 mg daily.  12.Mavik 1 mg p.o. daily.  13.Ultram 50 mg t.i.d. p.r.n.  14.Nexium 40 mg p.o. b.i.d.  15.Aspirin 81 mg daily.  16.Multivitamin once daily.  17.Calcium 600 mg b.i.d.  18.Fish oil 1000 mg b.i.d.   DISPOSITION:  The patient is discharged home medically stable and  improved condition.   HOSPITAL FOLLOWUP:  Will be with her primary care physician Dr. Everardo All  in approximately 2 weeks for Tuesday July 6 at 10:45 a.m. We have also  made a referral appointment with urologist Dr. Patsi Sears for Monday  July 12 at 2:45 p.m.  Due to the patient's complaints of mixed stress  and urge incontinence as reported on the day of discharge.  This has  been ongoing for over 3 months and due to need for probable urodynamic  testing, will delay evaluation until outpatient setting is appropriate.  The patient also will follow up with dermatologist, Dr. Nicholas Lose, and call  for appointment as needed.   HOSPITAL COURSE:  1. Methicillin-resistant Staphylococcus aureus perianal abscess.  The      patient is a 70 year old woman with chronic medical problems as      listed above who has been having recurrent abscesses with history      of  methicillin-resistant Staphylococcus aureus ongoing for several      months.  Due to this latest bout of irritation ongoing with      evaluation by dermatology, there is concern for need of further      surgical debridement and IV antibiotics.  She was referred to the      emergency room for further evaluation.  She was begun on IV      vancomycin and seen in consultation by general surgery who drained      the small abscesses in the ED.  She tolerated the procedure well      without complications and was continued on IV vancomycin for over      72 hours.  She had persistent having pain which was treated      initially with IV morphine and low-dose oral morphine with good      improvement in pain control on this regimen.  She has been      transitioned back to oral Doxycycline which should now be effective      in coverage given drainage of the abscesses and will follow up with      her primary care physician regarding need for questionable chronic      prophylaxis treatment as was discussed during this hospitalization.      Also follow up  with dermatologist Dr. Nicholas Lose as needed.  2. Mixed urinary incontinence.  On day of discharge, the patient has      noted over 3 months of incontinence initially urge by history with      leakage beginning prior to arriving to the toilet during this      hospitalization, mixed stress with urge and positional change that      she would began to leak urine upon standing from a sitting position      prior to the even beginning to move towards the toilet.  She has      been afebrile.  There has been no evidence of urinary tract      infection during this hospitalization.  We have made a referral to      be seen as an outpatient consult for urology evaluation and      possible urodynamic testing will be seen by Dr. Patsi Sears in July      as discussed above.  3. Other medical issues.  The patient's other chronic medical issues      are as listed above.  There have been no changes in her insulin      regimen or other medical treatment as ongoing prior to admission.      Over 30 minutes on day of discharge coordination for discharge      planning.      Valerie A. Felicity Coyer, MD  Electronically Signed     VAL/MEDQ  D:  04/15/2009  T:  04/15/2009  Job:  [id number]

## 2011-03-09 NOTE — H&P (Signed)
NAMEVADIE, [name]                 ACCOUNT NO.:  [id number]   MEDICAL RECORD NO.:  [id number]          PATIENT TYPE:  INP   LOCATION:  1304                         FACILITY:  [facility]   PHYSICIAN:  Georgina Quint. Plotnikov, MDDATE OF BIRTH:  1941/09/29   DATE OF ADMISSION:  04/19/2009  DATE OF DISCHARGE:                              HISTORY & PHYSICAL   CHIEF COMPLAINT:  Nausea and vomiting.   HISTORY OF PRESENT ILLNESS:  The patient is a 70 year old female with  multiple medical problems who was recently hospitalized to [facility] from June 18 to April 15, 2009, for MRSA groin abscesses.  She  was treated with IV vancomycin and discharged on oral doxycycline.  She  had two abscesses in the groin incised and drained by Dr. Daphine Deutscher.  Over  the past 2-3 days she developed urinary frequency, dysuria, nausea with  vomiting and dizziness.  She was getting progressively worse and  dehydrated and eventually was taken to [facility] emergency room.  She  was found to have a new urinary tract infection.  I was asked to admit  the patient.   PAST MEDICAL HISTORY:  Recent admission for MRSA groin abscesses status  post incision and drainage by Dr. Daphine Deutscher, type 2 diabetes insulin  dependent, hypertension, depression, fibromyalgia, irritable bowel  syndrome, anxiety, depression, neuropathy, obesity.   ALLERGIES:  AMBIEN, AMOXICILLIN, BEXTRA, CELEBREX, CODEINE, PREDNISONE,  NORVASC, DARVOCET.   CURRENT MEDICATIONS:  1. Levemir 130 units daily.  2. Toprol XL 25 mg daily.  3. Trazodone 150 mg daily.  4. Lexapro 40 mg daily.  5. Lasix 40 mg daily.  6. K-Dur 20 mEq daily.  7. Lipitor 20 mg daily.  8. Mavik 1 mg daily.  9. Ultram 50 mg 3 times a day p.r.n.  10.Nexium 40 mg twice a day.  11.Aspirin 81 mg daily.  12.Multivitamin one a day.  13.Lotrisone 1% cream twice a day.  14.Hibiclens 4% wash twice a week.  15.Doxycycline 100 mg twice a day.  16.MS Contin 50 mg twice a day.  17.Fish  oil.   SOCIAL HISTORY:  She lives with son, does not smoke or drink.   FAMILY HISTORY:  Positive for diabetes.   REVIEW OF SYSTEMS:  As above.  Negative for chest pain, syncope.  No  headache.  Has been constipated.  The rest of the 10 point review of  systems is as above or negative.   PHYSICAL EXAM:  VITAL SIGNS:  Temperature 98.5, blood pressure 125/64,  heart rate 73, respirations 20.  GENERAL:  She is in no acute distress.  Appears chronically ill.  Overweight female, nontoxic.  HEENT:  With dry lips and dry oral mucosa.  NECK:  Supple.  No meningeal signs.  LUNGS:  Clear.  No wheezes or rales.  HEART:  S1, S2, no murmur, no gallop.  ABDOMEN:  Soft, nontender.  No organomegaly or mass felt.  LOWER EXTREMITIES:  Without edema.  Calves nontender.  GROIN:  Examination per Dr. Nicanor Alcon no mature abscesses in need for  incision and drainage.  Intertrigo present.  NEUROLOGICAL:  Cranial nerves II-XII nonfocal.  Deep tendon reflexes,  muscle strength within normal limits.  She is alert, oriented,  cooperative.  JOINTS:  Without arthritic deformities.   LABS:  Urinalysis with leukocyte esterase small, white blood cells 11-  20, sodium 133, potassium 3.5, creatinine 1.2, BUN 23, glucose 288.  CBC  with WBC 8.7, hemoglobin 11.9, platelets 167,000, INR 1.0.  KUB with  large stool burden, no acute changes.   ASSESSMENT AND PLAN:  1. Nausea, vomiting, dehydration due to problem #2.  2. Urinary tract infection, new.  Will start on Cipro and vancomycin      IV.  3. MRSA (methicillin resistant Staphylococcus aureus) abscesses,      recurrent.  IV antibiotics as above.  Obtain urine culture and      sensitivity.  4. Type 2 diabetes on insulin.  Insulin resistant SS.  5. Irritable bowel syndrome.  6. Fibromyalgia.  7. Hypertension.  8. Depression with anxiety.  Continue current therapy.      Georgina Quint. Plotnikov, MD  Electronically Signed     AVP/MEDQ  D:  04/20/2009  T:   04/20/2009  Job:  [id number]   cc:   Gregary Signs A. Everardo All, MD  [address]  [place]  [place] [address]   [place] Daphine Deutscher, MD  [address], [address]  [place]  [place] [address]

## 2011-03-09 NOTE — Op Note (Signed)
[name], [name]                 ACCOUNT NO.:  [id number]   MEDICAL RECORD NO.:  [id number]          PATIENT TYPE:  INP   LOCATION:  1311                         FACILITY:  [facility]   PHYSICIAN:  Thorn[place] Park. Daphine Deutscher, MD  DATE OF BIRTH:  January 06, 1941   DATE OF PROCEDURE:  04/11/2009  DATE OF DISCHARGE:                               OPERATIVE REPORT   PREOPERATIVE DIAGNOSIS:  Recurrent MRSA (methicillin resistant  Staphylococcus aureus) involving two areas in the left perineum.   DESCRIPTION OF PROCEDURE:  The patient is in ER bed #2 at [facility].  I was called by Georgina Quint. Plotnikov, MD to see her there.  She is a  diabetic with recurrent MRSA abscesses.  Her husband, who resides in a  nursing home, has had recurrent MRSA infections.  In her left groin  slightly up on the mons in the left and then in the left groin crease  there were two hard nodules with areas of early skin breakdown.  These  areas were anesthetized with 1% lidocaine and a 31 gauge needle and were  each incised and drained with cruciate incisions returning  characteristic type staph pus.  The areas were cleaned right away with  Betadine prior to doing this and were dressed with bulky dressings.  The  patient is to be admitted to Dr. Posey Rea who will follow her.  I  discussed improving hygiene with Hibiclens, frequent showers and drying  the area well.   IMPRESSION:  MRSA (methicillin resistant Staphylococcus aureus)  abscesses in the left perineal region that appear contained and drained.      Thorn[place] Park Daphine Deutscher, MD  Electronically Signed     MBM/MEDQ  D:  04/11/2009  T:  04/12/2009  Job:  [id number]

## 2011-03-09 NOTE — Assessment & Plan Note (Signed)
[facility]                                 ON-CALL NOTE   NAME:NOELLMykenzie, Ebanks                        MRN:          [id number]  DATE:07/13/2008                            DOB:          Aug 23, 1941    Time is 7:13 p.m.   REGULAR DOCTOR:  Dr. Everardo All.   Dr. Milinda Antis, on call.   PHONE NUMBER:  [phone].   CHIEF COMPLAINT:  Blood sugar high and possible MRSA.   The patient states over the past week, her blood sugars has been running  high, as high as in the high 400s, before this she was listed to see Dr.  Everardo All on Friday, but instead she ended up going to the emergency room  at [facility] which is close to where she lives in [place].  She  said that they gave her some insulin and diagnosed her with skin  infection on her scalp.  She was given doxycycline and also a topical  agent.  Today, she states that her sugars are starting to go up into  high 400s again.  She does think the lesion on her scalp is improving,  in fact she is worried it is getting worse, with a temperature of 99.3  and is starting to feel bad and hasn't slept.  I advised her that she  needs to go back to the emergency room for evaluation and treatment.  This could be a sign of the infection getting worse.  Her preference is  to go back to [facility] emergency room since that is where she is seen  previously and that is where she is going to go.     Marne A. Tower, MD  Electronically Signed    MAT/MedQ  DD: 07/13/2008  DT: 07/14/2008  Job #: [id number]

## 2011-03-09 NOTE — Discharge Summary (Signed)
[name], [name]                 ACCOUNT NO.:  [id number]   MEDICAL RECORD NO.:  [id number]          PATIENT TYPE:  INP   LOCATION:  1304                         FACILITY:  [facility]   PHYSICIAN:  Sean A. Everardo All, MD    DATE OF BIRTH:  10-17-1941   DATE OF ADMISSION:  04/19/2009  DATE OF DISCHARGE:  04/24/2009                               DISCHARGE SUMMARY   REASON FOR ADMISSION:  UTI.   HISTORY OF PRESENT ILLNESS:  A 69 year old woman admitted by Dr.  Posey Rea on April 19, 2009, with UTI.  Please refer to her dictated  history and physical for details.   HOSPITAL COURSE BY PROBLEM:  Rash on her inguinal and perineal areas.  She was seen in consultation by Dr. Orvan Falconer who was extremely thorough  in his evaluation.  He discussed the case with Dr. Nicholas Lose, and a  diagnosis of lichenoid dermatitis had been made, per biopsy.  Dr.  Orvan Falconer discontinued the antibiotics, and in my opinion, the rash  improved throughout her hospitalization.   Regarding her diabetes, this was controlled with insulin during her  hospitalization.  She was treated with Lantus during her  hospitalization.  On this, she had the expected pattern of  mild  hypoglycemia in the morning, and glucose in the 200s later in the day.  This was thus changed to  Levemir, a somewhat shorter-acting insulin, to  be given in the morning, which is what she was on as an outpatient, in  an effort to better balance the variations in her glucose noted on  Lantus.   She has chronic diffuse body pain characterized as myalgias, low back  pain, headache, and pain at the perineum.  She called me last week after  her recent discharge saying that she felt very drowsy.  Thus, her long-  acting narcotics are changed to just Vicodin p.r.n.   She had a minimally abnormal chest x-ray which is being repeated on the  day of discharge.  There is no clinical evidence for pneumonia.  She was  ambulatory prior to discharge without oxygen with a  good oxygen  saturation.  She has some chronic edema of the lower extremities for  which she takes Lasix.   She also had hypokalemia, and her potassium supplement was increased.   Her antihypertensives had to be reduced and during her hospitalization  due to over control.     She has a history of MRSA abscesses, but no evidence of this was found  during this hospitalization.   She has chronic urologic symptoms including incontinence.  She is on  medication for this and has as an outpatient appointment with Dr.  Sherron Monday.  She was determined to not require any antibiotics for any  UTI.   DIAGNOSES AT THE TIME OF DISCHARGE:  1. 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  2. Type 2 diabetes.  3. History of methicillin-resistant staphylococcus aureus abscesses,      none during this admission.  4. Nausea, vomiting and dehydration; in retrospect, possibly due to      long-acting narcotics.  5. Several chronic pain syndromes.  Intolerance of long-acting      narcotics.  6. Hypokalemia.  7. Mildly abnormal chest x-ray.  8. Other chronic medical problems as noted in the history and      physical.   MEDICATIONS AT THE TIME OF DISCHARGE:  1. Desyrel 150 mg every night.  2. Lexapro 40 mg daily.  3. Lasix 40 mg daily.  4. Potassium chloride 20 mEq twice daily.  5. Zocor 40 mg every night.  6. Aspirin 81 mg daily.  7. MiraLax 17 g daily.  8. Levemir 60 units every morning.  9. NovoLog 5 units q.a.c. and every night p.r.n. glucose above 200.  10.Vicodin 5/500, one every 4 hours as needed for pain.  11.Ditropan 5 mg twice daily.  12.Toprol-XL 12.5 mg daily.   DIET:  Heart healthy.  No added salt.   ACTIVITY:  Ambulate with assistance.   FOLLOW-UP:  Will be with me, Dr. Everardo All, after she returns home.  She  also has an upcoming appointment with Dr. Sherron Monday of urology.  Also,  Dr. Nicholas Lose is getting the patient an appointment with a specialist in  this area in  [place], date and time to be determined.   SPECIFIC INSTRUCTIONS:  1. Check fingertip glucose q.a.c. and every night.  2. Long-acting narcotics should be avoided if at all possible, as she      has not tolerated these well in the past.  3. She is being transferred to a skilled facility today.      Sean A. Everardo All, MD  Electronically Signed     SAE/MEDQ  D:  04/24/2009  T:  04/24/2009  Job:  [id number]

## 2011-03-10 LAB — GLUCOSE, CAPILLARY
Glucose-Capillary: 231 mg/dL — ABNORMAL HIGH (ref 70–99)
Glucose-Capillary: 248 mg/dL — ABNORMAL HIGH (ref 70–99)

## 2011-03-11 LAB — GLUCOSE, CAPILLARY
Glucose-Capillary: 303 mg/dL — ABNORMAL HIGH (ref 70–99)
Glucose-Capillary: 171 mg/dL — ABNORMAL HIGH (ref 70–99)
Glucose-Capillary: 244 mg/dL — ABNORMAL HIGH (ref 70–99)

## 2011-03-12 LAB — GLUCOSE, CAPILLARY: Glucose-Capillary: 256 mg/dL — ABNORMAL HIGH (ref 70–99)

## 2011-03-12 NOTE — Assessment & Plan Note (Signed)
[facility]                            CARDIOLOGY OFFICE NOTE   NAME:NOELLMariaguadalupe, Fialkowski                        MRN:          [id number]  DATE:01/18/2007                            DOB:          [birth year]    [name] is a very pleasant 70 year old, obese, white, married female  with noncardiac chest pain.  Recent coronary angiography reveals normal  coronary arteries, and good LV.  She also has diabetes, hypertension,  hyperlipidemia, GERD, Barrett's esophagus, and irritable bowel syndrome.   She, a few years ago, went through the [employer] and lost 25 to  30 pounds, but has regained about 20 pounds.  She would like to go back,  but does not feel she can afford it at time.  She is having some  atypical chest pain, but no shortness of breath or diaphoresis.   MEDICATIONS:  Include:  1. Lantus insulin.  2. Humalog.  3. Byetta 5 units b.i.d.  4. Hyoscyamine 0.375.  5. Toprol XL 25.  6. Amitriptyline.  7. Lexapro.  8. Furosemide 40.  9. K-Dur 20 b.i.d.  10.Lipitor 20.  11.Mavik.  12.Ultram.  13.Neurontin.  14.Nexium.  15.Amitiza 25 mg t.i.d.  16.MiraLax.  17.Fiber.   Blood pressure is 126/69.  Pulse 81.  Normal sinus rhythm.  GENERAL APPEARANCE:  Normal.  JVP is not elevated.  Carotid pulses palpable, equal without bruits.  LUNGS:  Clear.  CARDIAC EXAM:  Unremarkable.  EXTREMITIES:  Reveal no edema.   IMPRESSION:  Diagnoses as above.  Blood pressure is well controlled, as  have been her lipids.   We plan to check BMP and lipids in the near future.  She is to see Dr.  Everardo All later this week or next week, and I suggested she should follow  up with Dr. Lewayne Bunting in [place]  because of her multiple risk factors.   I should note that her EKG is normal except for mild LAD.     [name], MD, [facility]  Electronically Signed    EJL/MedQ  DD: 01/18/2007  DT: 01/18/2007  Job #: [id number]

## 2011-03-12 NOTE — Assessment & Plan Note (Signed)
[facility]                              CARDIOLOGY OFFICE NOTE   NAME:[name], [name]                        MRN:          [id number]  DATE:06/22/2006                            DOB:          10/07/1941    The patient is a pleasant 70 year old obese white married female with  noncardiac chest pain, normal coronary arteries, good LV function, diabetes,  hypertension, hyperlipidemia, GERD, Barrett's esophagus, irritable bowel  syndrome.   The patient has been feeling somewhat fatigued recently, has noted some  peripheral edema.  On last at last visit we had increased her Toprol to 50,  added Norvasc 5 and increased her aspirin as well as increased her K-Dur to  20 t.i.d.   ADDITIONAL MEDICATIONS:  1. Lipitor.  2. Insulin.  3. Lexapro.  4. Mavik 0.5 mg daily.  5. Lasix 40.  6. __________.  7. Amitiza.  8. MiraLax.   Blood pressure 120/61, pulse 77, normal sinus rhythm.  GENERAL APPEARANCE:  Normal, JVP is not elevated.  Carotid pulses palpated  without bruits.  LUNGS:  Clear.  CARDIAC EXAM:  Normal.  EXTREMITIES:  Reveal trace edema.   I should note that the patient's chief symptoms at this time are fatigue,  nausea and diarrhea.   I suggest she discontinue the Norvasc and decrease the Toprol-XL to 25 and  the K-Dur back to 20 twice a day.   Will plan to repeat the LFTs and lipids in 2 weeks and a BMP at the same  time.  I am referring her back to the [facility].  Her  EKG is normal.                              E. Graceann Congress, MD, [facility]    EJL/MedQ  DD:  06/22/2006  DT:  06/23/2006  Job #:  [id number]

## 2011-03-12 NOTE — Op Note (Signed)
   [name], [name]                           ACCOUNT NO.:  [id number]   MEDICAL RECORD NO.:  [id number]                   PATIENT TYPE:  AMB   LOCATION:  ENDO                                 FACILITY:  [facility]   PHYSICIAN:  Georgiana Spinner, M.D.                 DATE OF BIRTH:  04/10/1941   DATE OF PROCEDURE:  07/26/2002  DATE OF DISCHARGE:                                 OPERATIVE REPORT   PROCEDURE:  Upper endoscopy.   INDICATIONS:  Hemoccult positivity.   ANESTHESIA:  Demerol 60 mg, Versed 6 mg.  Gentamicin was given 80 mg ,  Inderal with erythromycin 500 mg.   DESCRIPTION OF PROCEDURE:  With the patient mildly sedated in the left  lateral decubitus position, the Olympus videoscopic endoscope was inserted  in the mouth, passed under direct vision through the esophagus, which  appeared normal, into the stomach.  Fundus, body appeared to show changes of  gastritis.  These were photographed and biopsied.  The antrum appeared  normal, as did the duodenal bulb and second portion of the duodenum.  From  this point the endoscope was slowly withdrawn, taking circumferential views  of the entire duodenal mucosa until the endoscope pulled back into the  stomach, placed in retroflexion to view the stomach from below.  The  endoscope was then straightened and withdrawn, taking circumferential views  of the remaining gastric and esophageal mucosa.  The patient's vital signs  and pulse oximetry remained stable.  The patient tolerated the procedure  well without apparent complications.   FINDINGS:  Changes of gastritis of body and fundus of stomach, biopsied.   PLAN:  Await biopsy report.  The patient will call me for results and follow  up with me as an outpatient.  Proceed to colonoscopy as planned.                                               Georgiana Spinner, M.D.    GMO/MEDQ  D:  07/26/2002  T:  07/27/2002  Job:  [id number]

## 2011-03-12 NOTE — Assessment & Plan Note (Signed)
HEALTHCARE                              CARDIOLOGY OFFICE NOTE   NAME:[name], [name] LUBBEN                        MRN:          [id number]  DATE:06/03/2006                            DOB:          September 12, 1941    SUBJECTIVE:  [name] is a very pleasant 70 year old female patient with a  history of hypertension, hyperlipidemia, diabetes and fibromyalgia who  recently saw Dr. Corinda Gubler for exertional chest discomfort as well as an  abnormal Myoview study.  This shows mild anterior wall ischemia and she was  set up for cardiac catheterization.  This was done by Dr. Gala Romney on May 20, 2006.  This revealed normal coronary arteries and normal LV function  with minimally elevated left ventricular and diastolic pressure.  It was  felt  her pain was noncardiac.  She returns today for followup.  She is  doing well without any recurrent  chest pain or shortness of breath.   MEDICATIONS:  1. Lipitor 20 mg q.h.s.  2. Insulin hyoscyamine,  3. Elavil.  4. Lexapro.  5. K-Dur 20 mEq three times a day.  6. Mavik 1 mg 1/2 tablet daily.  7. Multivitamin.  8. Calcium.  9. Fish oil.  10.Lasix 40 mg daily.  11.Byetta 5 mg b.i.d.  12.Amative 24 mg b.i.d.  13.MiraLax Benefiber.  14.Nexium 40 mg b.i.d.  15.Ultram 50 mg four times a day.  16.Toprol XL 50 mg a day.  17.Norvasc 5 mg a day.  18.Aspirin 325 mg daily.   PHYSICAL EXAMINATION:  GENERAL:  She is a well-nourished, well-developed  female in no acute distress.  VITAL SIGNS:  Blood pressure is 128/80, pulse 74, weight 211 pounds.  HEENT:  Unremarkable.  CARDIAC:  Normal S1 and S2, regular rate and rhythm.  LUNGS:  Clear to auscultation bilaterally.  ABDOMEN:  Soft and nontender.  EXTREMITIES: Without edema.  Right groin without hematomas of bruits.   IMPRESSION:  1. Noncardiac chest pain--resolved.  2. Normal coronary arteries by catheterization.  3. Good left ventricular function.  4. History of mitral  valve prolapse per patient.      a.     Echocardiogram in 2004 revealed thickened mitral valve but no       prolapse and only trace mitral regurgitation.  5. Diabetes mellitus type 2.  6. Hypertension.  7. Treated dyslipidemia.  8. Gastroesophageal reflux disease/Barrett's esophagus.  9. Irritable bowel syndrome.  10.Fibromyalgia.   PLAN:  The patient is doing well from a post catheterization standpoint.  She had several questions I answered for her today regarding her abnormal  Myoview study and normal cardiac catheterization.  Continued risk factor  modification is prescribed.  Her goal LDL will be less than 70 and she will  follow up with Dr. Everardo All for management of this as well as her other  medical problems.  She can see Dr. Corinda Gubler back in 12 months time.  We will  follow up a BMET today as she did have some hypokalemia on her pre-cath  workup.  Tereso Newcomer, PA-C                              Salvadore Farber, MD   SW/MedQ  DD:  06/03/2006  DT:  06/03/2006  Job #:  [id number]   cc:   Gregary Signs A. Everardo All, MD

## 2011-03-12 NOTE — H&P (Signed)
[facility] ADMISSION   NAME:[name], [name] QUIGG                        MRN:          [id number]  DATE:05/18/2006                            DOB:          [birth year]    The patient is a very pleasant 70 year old obese white married female with  diabetes, hyperlipidemia, hypertension, fibromyalgia and depression, who is  seen here for the first time in three years.  She states that she has had  some recent symptoms of GERD.  Was found to have Barrett's esophagus.  In  addition, she has had precordial chest discomfort usually associated with  exertion, relieved in a few minutes with rest.   Dr. Everardo All referred her for myocardial perfusion study because of this and  she was noted to have an abnormal adenosine nuclear study with very mild  anterior ischemia, felt to be a low risk study.  There was no LV  contractility and thickening in all areas.  The LV was normal.  Three years  ago study was interpreted as no ischemia.  Patient three years ago went to  [facility].  Lost 11 pounds, but has now regained that.   I should note the patient also has some dizziness.   Patient has history of mitral valve prolapse.  Echo shows some thickening  mitral valve but no evidence of mitral valve prolapse.  There was trace  mitral regurgitation on previous 2-D echo.   Additional past history of irritable bowel syndrome.   MEDICATIONS:  1.  Lipitor 20.  2.  Insulin.  3.  Hyoscyamine.  4.  Toprol XL 25.  5.  Elavil 25.  6.  Lexapro 10.  7.  K-Dur 20 b.i.d.  8.  Mavik 1/2 mg daily.  9.  Aspirin 81.  10. Multivitamin.  11. Calcium.  12. Fish oil.  13. Lasix 40.  14. MiraLax.  15. Byetta 5 b.i.d.   ALLERGIES:  1.  BEXTRA.  2.  CELEBREX.  3.  AMOXICILLIN.  4.  DARVOCET.  5.  FLU VACCINE.  6.  PREDNISONE.  7.  CODEINE.   SOCIAL HISTORY:  Patient does not smoke or drink.   REVIEW OF SYSTEMS:   She has history of migraines.  CARDIOVASCULAR:  As noted  above.  GI:  History of Barrett's esophagus.  She has had colonoscopy in the  past.  GU:  History unremarkable.   FAMILY HISTORY:  Diabetes, coronary artery disease.  Father died at 74 of  myocardial infarction and diabetes.  Mother died at 36 of congestive heart  failure, diabetes and renal failure.   PHYSICAL EXAMINATION:  VITAL SIGNS:  Blood pressure 120/76.  Pulse 80.  Normal sinus rhythm.  GENERAL:  Patient is obese.  JVP is not elevated.  Carotid pulse palpable and equal without bruits.  Thyroid normal.  LUNGS:  Clear.  CARDIAC:  There is no murmur, gallop or click.  ABDOMEN:  Liver, spleen, and kidneys not palpable.  It is obese.  GU:  No mass.  EXTREMITIES:  Normal.  Pulses palpable and equal without bruits.  NEUROLOGICAL:  Unremarkable.   As noted above, she has abnormal adenosine stress with very mild anterior  ischemia.   IMPRESSION:  1.  Exertional chest discomfort and abnormal Myoview, suggesting coronary      artery disease with angina pectoris.  2.  Obesity.  3.  Diabetes.  4.  Hypertension.  5.  Hyperlipidemia.  6.  Barrett's esophagus.  7.  Irritable bowel syndrome.  8.  History of mitral valve prolapse.  No murmur.   Because of the recent onset of rather typical exertional chest tightness and  mild anterior ischemia on stress test, and the fact that she has multiple  risk factors, I have suggested coronary angiography.  This will be performed  in the outpatient lab.  Patient agrees with this  approach.  In the interim we plan to increase her Toprol to 50 daily and  will add Norvasc 5 and increase the aspirin to 362.   ADDENDUM:  I have reviewed the coronary angiography and the risks involved,  and the patient is agreeable with this approach.[name], MD, [facility]  Job# [id number]  EJL/MedQ  DD:  05/18/2006  DT:  05/18/2006  Job #:  [id number]   cc:   Gregary Signs  A. Everardo All, MD

## 2011-03-12 NOTE — Letter (Signed)
August 16, 2006     [facility]  [address]  [place], [place] [address]   RE:  [name], [name]  MRN:  [id number]  /  DOB:  06-15-1941   Dear Ms. Helms,   I am withdrawing from your gastroenterology care for noncompliance purposes.  You have missed 3 consecutive office appointments dated September 14,  October 8, and August 16, 2006. Our office will be glad to forward you the  name of competent gastroenterologists in this area should you so desire.    Sincerely,      Vania Rea. Jarold Motto, MD, [facility], FAGA    DRP/MedQ  DD: 08/16/2006  DT: 08/17/2006  Job #: [id number]

## 2011-03-12 NOTE — Cardiovascular Report (Signed)
[name]                 ACCOUNT NO.:  [id number]   MEDICAL RECORD NO.:  [id number]          PATIENT TYPE:  OIB   LOCATION:  1961                         FACILITY:  [facility]   PHYSICIAN:  Arvilla Meres, M.D. LHCDATE OF BIRTH:  14-Aug-1941   DATE OF PROCEDURE:  05/20/2006  DATE OF DISCHARGE:                              CARDIAC CATHETERIZATION   PRIMARY CARE PHYSICIAN:  Dr. Romero Belling   CARDIOLOGIST:  Dr. Glennon Hamilton   IDENTIFICATION:  [name] is a very pleasant 70 year old woman with a  history of hypertension, hyperlipidemia, diabetes and fibromyalgia.  She has  been having some atypical chest pain.  She underwent Cardiolite scan which  showed a question of anterior ischemia and she is thus referred for cardiac  catheterization in the outpatient catheterization lab.   PROCEDURES PERFORMED:  1.  Selective coronary angiography.  2.  Left heart catheterization.  3.  Left ventriculogram.   DESCRIPTION OF PROCEDURE:  The risks and benefits of the catheterization  were explained.  Consent was signed and placed on the chart.  A 4-French  arterial sheath was placed in the right femoral artery under 1% local  lidocaine using a modified Seldinger technique.  Standard catheters  including a JL-4, 3D-RC, and angled pigtail were used for the procedure.  There are no apparent complications.   Central aortic pressure 99/53, the mean is 74.  LV pressure 90 with an LVEDP  of 17.  There is no aortic stenosis.   Left main was normal.   Left circumflex was a large vessel.  It gave off a large ramus, a small  first posterolateral and a large second posterolateral.  Angiographically  normal.  LAD was a long vessel wrapping the apex that gave off three small  diagonals, angiographically normal.   Right coronary artery was a moderate-sized dominant vessel.  It gave off a  large acute marginal branch and a small PDA.  It was angiographically  normal.   Left ventriculogram done in  the RAO position showed a hyperdynamic ventricle  with an EF of 70-75%.  No wall motion abnormalities.  No mitral  regurgitation.   ASSESSMENT:  1.  Normal coronary arteries.  2.  Normal left ventricular function with minimally elevated left      ventricular end-diastolic pressure.   PLAN/DISCUSSION:  I suspect her pain is noncardiac.  We will continue with  aggressive risk factor modification.  Given her diabetes, would titrate her  statin to get her LDL under 70.      Arvilla Meres, M.D. [facility]  Electronically Signed     DB/MEDQ  D:  05/20/2006  T:  05/20/2006  Job:  [id number]

## 2011-03-12 NOTE — Letter (Signed)
June 22, 2006     [facility]  [address] [place], [place] [address].   RE:  [name], [name]  MRN:  [id number]  /  DOB:  Mar 19, 1941   [name] is a very pleasant 70 year old obese white female with  diabetes, hypertension and hyperlipidemia who attended the [facility] in 2004.  She was successful at that time.  The patient would  like to return as a patient for further care.  She was recently seen because  of some atypical chest pain and abnormal Cardiolite.  Catheterization was  normal.  The LV was hyperdynamic.   Thank you for your help.    Sincerely,      E. Graceann Congress, MD, [facility]   EJL/MedQ  DD:  06/22/2006  DT:  06/23/2006  Job #:  [id number]   CC:    Denman George

## 2011-03-12 NOTE — H&P (Signed)
[name], [name]                           ACCOUNT NO.:  [id number]   MEDICAL RECORD NO.:  [id number]                   PATIENT TYPE:  EMS   LOCATION:  MINO                                 FACILITY:  [facility]   PHYSICIAN:  Ollen Gross. Vernell Morgans, M.D.              DATE OF BIRTH:  1941-01-26   DATE OF ADMISSION:  01/05/2003  DATE OF DISCHARGE:                                HISTORY & PHYSICAL   CHIEF COMPLAINT:  Nausea, vomiting, and diarrhea.   HISTORY OF PRESENT ILLNESS:  The patient is a 70 year old white female who  developed acute onset of nausea, vomiting, and diarrhea earlier today.  She  also has had some sort of diffuse central abdominal pain as well.  She  denied any fevers or chills.  She came to the emergency department for  further evaluation, and since being here, her pain has resolved.  A right  upper quadrant ultrasound was obtained that showed stones in her gallbladder  but no real bowel dilatation or gallbladder wall thickening.   REVIEW OF SYSTEMS:  She again denies any current nausea or vomiting,  although she had nausea and vomiting earlier.  She has had no fevers,  chills, chest pain, or shortness of breath.  She has had a little bit of  diarrhea but no dysuria.  The rest of her Review of Systems was fairly  unremarkable.   PAST MEDICAL HISTORY:  1. Fibromyalgia.  2. Mitral valve prolapse.  3. High cholesterol.  4. Hypertension.  5. Irritable bowel.  6. Diabetes.  7. Depression.  8. Diabetic neuropathy.   PAST SURGICAL HISTORY:  1. Tubal ligation.  2. Surgery on her finger.   MEDICATIONS:  Actos, Glucotrol, Lantus insulin, Toprol 50 mg a day,  amitriptyline, Paxil, Lasix 80 mg a day, K-Dur, Lipitor, aspirin, and  multivitamins.   ALLERGIES:  PREDNISONE, PENICILLIN, CORTISONE, DARVOCET, CELEBREX, CODEINE,  BEXTRA, FLU VACCINE, DIAZEPAM.   SOCIAL HISTORY:  She denies use of alcohol and tobacco products.   FAMILY HISTORY:  Significant for diabetes and  coronary artery disease in her  parents.   PHYSICAL EXAMINATION:  VITAL SIGNS:  Temperature 97.1, blood pressure  109/58, pulse 76, O2 saturation 99%.  GENERAL:  Well developed, well nourished white female in no acute distress.  SKIN:  Warm and dry with no jaundice.  HEENT:  Extraocular muscles are intact.  Pupils reactive to light.  NECK:  No bruits.  I cannot palpate any masses.  Trachea is midline.  LUNGS:  Clear bilaterally.  HEART:  Regular rate and rhythm.  ABDOMEN:  Soft and nontender with no obvious mass or hepatosplenomegaly.  EXTREMITIES:  No cyanosis, clubbing, or edema.  PSYCHOLOGIC:  Alert and oriented x 3.   LABORATORY DATA:  Amylase 2066.  White count 7600, hemoglobin 12.3,  hematocrit 35.6, platelet count 181.  Sodium 135, potassium 4.1, chloride  99, CO2 31, BUN 29,  creatinine 1.4, glucose 218.  Total bilirubin 0.9,  alkaline phosphatase 107, SGOT 44, SGPT 77.   ASSESSMENT/PLAN:  This is a 70 year old white female with probable gallstone  pancreatitis.  We will plan to admit her for bowel reset and IV hydration.  We will manage her diabetes with regular subcutaneous sliding scale insulin  and check her blood sugars frequently.  We will recheck her liver functions  and amylase in the morning.  If these are not improving, we will consult  gastroenterology for a possible ERCP.  If they continue to improve, then  when her pancreatitis is resolved, we will plan to remove her gallbladder.  I have explained the surgery to remove her gallbladder to her and her  husband in detail including the risks and benefits, and they understand and  agree with this course of therapy.                                               Ollen Gross. Vernell Morgans, M.D.    PST/MEDQ  D:  01/05/2003  T:  01/06/2003  Job:  [id number]

## 2011-03-12 NOTE — H&P (Signed)
[facility] ADMISSION   NAME:[name], [name]                        MRN:          [id number]  DATE:05/18/2006                            DOB:          04-03-1941    ADDENDUM:  I have reviewed the coronary angiography and the risks involved,  and the patient is agreeable with this approach.                                   [name], MD, [facility]   EJL/MedQ  DD:  05/18/2006  DT:  05/18/2006  Job #:  [id number]

## 2011-03-12 NOTE — Discharge Summary (Signed)
   [name], BUSWELL                           ACCOUNT NO.:  [id number]   MEDICAL RECORD NO.:  [id number]                   PATIENT TYPE:  INP   LOCATION:  5703                                 FACILITY:  [facility]   PHYSICIAN:  Ollen Gross. Vernell Morgans, M.D.              DATE OF BIRTH:  09/28/1941   DATE OF ADMISSION:  01/05/2003  DATE OF DISCHARGE:  01/09/2003                                 DISCHARGE SUMMARY   BRIEF HOSPITAL COURSE:  Briefly, the patient is a 70 year old white female  with diabetes who presented with central abdominal pain.  She was found to  have gallstone pancreatitis.  She was admitted for bowel rest and  monitoring.  On hospital day #2 her pain was improving and her LFTs and  amylase were slowly improving.  On hospital day #3 she was taken to the  operating room and underwent a laparoscopic cholecystectomy with  intraoperative cholangiogram, she tolerated this operation well, her bile  ducts were clear.  Postoperatively, she had some problems with nausea and  required an extra day of recovery before finally getting over the nausea and  tolerating liquids and being ready for discharge home.  She was then  discharged on 01/09/2003.   MEDICATIONS AT TIME OF DISCHARGE:  She was to resume and her meds and she  was given a prescription for Vicodin for pain.   CONDITION:  Stable.   ACTIVITY LEVEL:  No heavy lifting.   DIET:  As tolerated.   DISPOSITION:  She is discharged home.   FOLLOW UP:  Followup will be with Dr. Carolynne Edouard in 2 weeks.   FINAL DIAGNOSES:  Cholecystitis with cholelithiasis and gallstone  pancreatitis.                                               Ollen Gross. Vernell Morgans, M.D.    PST/MEDQ  D:  02/20/2003  T:  02/21/2003  Job:  [id number]

## 2011-03-12 NOTE — Op Note (Signed)
   NAMEROLENA, [name]                           ACCOUNT NO.:  [id number]   MEDICAL RECORD NO.:  [id number]                   PATIENT TYPE:  AMB   LOCATION:  ENDO                                 FACILITY:  [facility]   PHYSICIAN:  Georgiana Spinner, M.D.                 DATE OF BIRTH:  09-Apr-1941   DATE OF PROCEDURE:  07/26/2002  DATE OF DISCHARGE:                                 OPERATIVE REPORT   PROCEDURE PERFORMED:  Colonoscopy.   ENDOSCOPIST:  Georgiana Spinner, M.D.   INDICATIONS FOR PROCEDURE:   ANESTHESIA:  Demerol 40 mg, Versed 4 mg.   DESCRIPTION OF PROCEDURE:  With the patient mildly sedated in the left  lateral decubitus position and subsequently rolled to her back, the Olympus  video colonoscope was inserted in the rectum and passed under direct vision  through the tortuous colon to the cecum, identified by the ileocecal valve  and appendiceal orifice.  In the base of the cecum was a rounded pill that  was captured in the photograph.  From this point the colonoscope was slowly  withdrawn taking circumferential views of the entire colonic mucosa stopping  only in the rectum which appeared normal on direct and showed hemorrhoids on  retroflex view.  The endoscope was straightened and withdrawn.  The  patient's vital signs and pulse oximeter remained stable.  The patient  tolerated the procedure well without apparent complications.   FINDINGS:  Internal hemorrhoids.  Otherwise unremarkable colonoscopic  examination other than tortuosity.   PLAN:  See endoscopy note for further details.                                                 Georgiana Spinner, M.D.    GMO/MEDQ  D:  07/26/2002  T:  07/27/2002  Job:  [id number]

## 2011-03-12 NOTE — Op Note (Signed)
NAMEMCKAYLEE, [name]                           ACCOUNT NO.:  [id number]   MEDICAL RECORD NO.:  [id number]                   PATIENT TYPE:  INP   LOCATION:  5703                                 FACILITY:  [facility]   PHYSICIAN:  Ollen Gross. Vernell Morgans, M.D.              DATE OF BIRTH:  16-Sep-1941   DATE OF PROCEDURE:  01/07/2003  DATE OF DISCHARGE:  01/09/2003                                 OPERATIVE REPORT   PREOPERATIVE DIAGNOSES:  Gallstone pancreatitis.   POSTOPERATIVE DIAGNOSES:  Gallstone pancreatitis.   PROCEDURE:  Laparoscopic cholecystectomy with intraoperative cholangiogram.   SURGEON:  Ollen Gross. Carolynne Edouard, M.D.   ASSISTANT:  Lebron Conners, M.D.   ANESTHESIA:  General endotracheal.   DESCRIPTION OF PROCEDURE:  After informed consent was obtained, the patient  was brought to the operating room and placed in the supine position on the  operating table. After adequate induction of general anesthesia, the  patient's abdomen was prepped with Betadine and draped in the usual sterile  manner. The area below the umbilicus was infiltrated with 0.25% Marcaine, a  small incision was made with a 15 blade knife and this incision was carried  down through the subcutaneous tissue bluntly using Kelly clamps and [employer] retractors until the linea alba was identified. The linea alba was then  incised with a 15 blade knife and each side was grasped with Kocher clamps  and elevated anteriorly. The preperitoneal space was then probed bluntly  with the hemostat until the peritoneum was opened and access was gained to  the abdominal cavity. A #0 Vicryl pursestring stitch was then placed in the  fascia surrounding this opening. A Hasson cannula was placed through the  opening and then anchored in place with the previously placed Vicryl  pursestring stitch. The abdomen was then insufflated with carbon dioxide  without difficulty. The patient was placed in a head up position, the  laparoscope was placed  through the Hasson cannula and the abdomen was  inspected in the right upper quadrant. The dome of the gallbladder and liver  were readily identified. The epigastric region was then infiltrated with  0.25% Marcaine and a small incision was made with a 15 blade knife. A 10 mm  port was then placed bluntly through this incision into the abdominal cavity  under direct vision. Sites were  then chosen laterally on the right side of  the abdomen after placement of 5 mm ports and each of these areas was  infiltrated with 0.25% Marcaine. Small stab incisions were made with a 15  blade knife and 5 mm ports were placed bluntly through these incisions into  the abdominal cavity under direct vision. A blunt grasper was placed through  the lateral most 5 mm port and used to grasp the dome of the gallbladder and  elevate it anteriorly. Another blunt grasper was placed through the other 5  mm  port and used to retract on the body and neck of the gallbladder. A  dissector was placed through the epigastric port and using the  electrocautery, the peritoneal reflection at the gallbladder neck was  opened, blunt dissection was then carried out in this area until the  gallbladder neck, cystic duct junction was readily identified and a good  window was created., A single clip was placed on the gallbladder neck, a  small ductotomy was made with the laparoscopic scissors. A 14 gauge  angiocath was placed percutaneously through the anterior abdominal wall  under direct vision. A Reddick cholangiogram catheter was then placed  through the angiocath and flushed. The Reddick catheter was then placed  within the cystic duct and anchored in place with the clip. A cholangiogram  was obtained that showed good emptying into the duodenum. No obvious filling  defects and adequate length on the cystic duct. The anchoring clip and  catheters were then removed from the patient. Three clips were placed  proximally on the cystic  duct and the duct was divided between the two sets  of clips. Posterior to this, the cystic artery was identified and again  dissected in a circumferential manner until a good window was created and  two clips were placed proximally, one distally on the artery and the artery  was divided between the two. Next, a laparoscopic hook cautery device was  used to separate the gallbladder from the liver bed. Prior to completely  detaching the gallbladder from the liver bed, the liver bed was inspected  and several small bleeding points were coagulated with the electrocautery.  The gallbladder was then detached the rest of the way from the liver bed  using the electrocautery. An endoscopic bag was placed through the  epigastric port and the gallbladder was placed within the bag and the bag  was sealed. The abdomen was then irrigated with copious amounts of saline  until the affluent was clear. The liver bed was inspected again and found to  be hemostatic. The laparoscope was the moved to the upper midline port and a  gallbladder grasper was placed through the Hasson cannula and used to grasp  the opening in the bag. The bag with the gallbladder was then removed  through the infraumbilical port without difficulty. The fascial defect was  closed with the previously placed Vicryl pursestring stitch. The rest of the  ports were removed under direct vision and were found to be hemostatic. Gas  was allowed to escape. The skin incisions were then closed with interrupted  4-0 Monocryl subcuticular stitches, Benzoin and Steri-Strips were applied.  The patient tolerated the procedure well. At the end of the case, all  sponge, needle and instrument counts were correct. The patient was awakened  and taken to the recovery room in stable condition.                                               Ollen Gross. Vernell Morgans, M.D.    PST/MEDQ  D:  01/21/2003  T:  01/21/2003  Job:  [id number]

## 2011-03-13 LAB — GLUCOSE, CAPILLARY
Glucose-Capillary: 260 mg/dL — ABNORMAL HIGH (ref 70–99)
Glucose-Capillary: 235 mg/dL — ABNORMAL HIGH (ref 70–99)

## 2011-03-14 LAB — GLUCOSE, CAPILLARY
Glucose-Capillary: 218 mg/dL — ABNORMAL HIGH (ref 70–99)
Glucose-Capillary: 101 mg/dL — ABNORMAL HIGH (ref 70–99)
Glucose-Capillary: 188 mg/dL — ABNORMAL HIGH (ref 70–99)

## 2011-03-15 LAB — GLUCOSE, CAPILLARY
Glucose-Capillary: 220 mg/dL — ABNORMAL HIGH (ref 70–99)
Glucose-Capillary: 164 mg/dL — ABNORMAL HIGH (ref 70–99)

## 2011-03-16 LAB — GLUCOSE, CAPILLARY
Glucose-Capillary: 167 mg/dL — ABNORMAL HIGH (ref 70–99)
Glucose-Capillary: 161 mg/dL — ABNORMAL HIGH (ref 70–99)

## 2011-03-17 LAB — GLUCOSE, CAPILLARY
Glucose-Capillary: 245 mg/dL — ABNORMAL HIGH (ref 70–99)
Glucose-Capillary: 152 mg/dL — ABNORMAL HIGH (ref 70–99)
Glucose-Capillary: 94 mg/dL (ref 70–99)
Glucose-Capillary: 62 mg/dL — ABNORMAL LOW (ref 70–99)

## 2011-03-18 LAB — GLUCOSE, CAPILLARY: Glucose-Capillary: 96 mg/dL (ref 70–99)

## 2011-04-05 ENCOUNTER — Ambulatory Visit: Payer: Medicare Other | Attending: Orthopedic Surgery | Admitting: Physical Therapy

## 2011-04-05 DIAGNOSIS — R5381 Other malaise: Secondary | ICD-10-CM | POA: Insufficient documentation

## 2011-04-05 DIAGNOSIS — Z96659 Presence of unspecified artificial knee joint: Secondary | ICD-10-CM | POA: Insufficient documentation

## 2011-04-05 DIAGNOSIS — R262 Difficulty in walking, not elsewhere classified: Secondary | ICD-10-CM | POA: Insufficient documentation

## 2011-04-05 DIAGNOSIS — M25569 Pain in unspecified knee: Secondary | ICD-10-CM | POA: Insufficient documentation

## 2011-04-05 DIAGNOSIS — IMO0001 Reserved for inherently not codable concepts without codable children: Secondary | ICD-10-CM | POA: Insufficient documentation

## 2011-04-05 DIAGNOSIS — M25669 Stiffness of unspecified knee, not elsewhere classified: Secondary | ICD-10-CM | POA: Insufficient documentation

## 2011-04-07 ENCOUNTER — Ambulatory Visit: Payer: Medicare Other | Admitting: Physical Therapy

## 2011-04-12 ENCOUNTER — Ambulatory Visit: Payer: Medicare Other | Admitting: Physical Therapy

## 2011-04-14 ENCOUNTER — Ambulatory Visit: Payer: Medicare Other | Admitting: Physical Therapy

## 2011-04-16 ENCOUNTER — Encounter: Admitting: Physical Therapy

## 2011-04-19 ENCOUNTER — Ambulatory Visit: Payer: Medicare Other | Admitting: Physical Therapy

## 2011-04-21 ENCOUNTER — Encounter: Admitting: Physical Therapy

## 2011-04-22 ENCOUNTER — Ambulatory Visit: Payer: Medicare Other | Admitting: *Deleted

## 2011-04-26 ENCOUNTER — Ambulatory Visit: Payer: Medicare Other | Attending: Orthopedic Surgery | Admitting: Physical Therapy

## 2011-04-26 DIAGNOSIS — M25669 Stiffness of unspecified knee, not elsewhere classified: Secondary | ICD-10-CM | POA: Insufficient documentation

## 2011-04-26 DIAGNOSIS — R5381 Other malaise: Secondary | ICD-10-CM | POA: Insufficient documentation

## 2011-04-26 DIAGNOSIS — R262 Difficulty in walking, not elsewhere classified: Secondary | ICD-10-CM | POA: Insufficient documentation

## 2011-04-26 DIAGNOSIS — IMO0001 Reserved for inherently not codable concepts without codable children: Secondary | ICD-10-CM | POA: Insufficient documentation

## 2011-04-26 DIAGNOSIS — Z96659 Presence of unspecified artificial knee joint: Secondary | ICD-10-CM | POA: Insufficient documentation

## 2011-04-26 DIAGNOSIS — M25569 Pain in unspecified knee: Secondary | ICD-10-CM | POA: Insufficient documentation

## 2011-04-29 ENCOUNTER — Ambulatory Visit: Payer: Medicare Other | Admitting: Physical Therapy

## 2011-05-03 ENCOUNTER — Ambulatory Visit: Payer: Medicare Other | Admitting: Physical Therapy

## 2011-05-05 ENCOUNTER — Ambulatory Visit: Payer: Medicare Other | Admitting: Physical Therapy

## 2011-05-06 ENCOUNTER — Ambulatory Visit: Payer: Medicare Other | Admitting: *Deleted

## 2011-05-10 ENCOUNTER — Ambulatory Visit: Payer: Medicare Other | Admitting: Physical Therapy

## 2011-05-12 ENCOUNTER — Ambulatory Visit: Payer: Medicare Other | Admitting: Physical Therapy

## 2011-05-14 ENCOUNTER — Ambulatory Visit: Payer: Medicare Other | Admitting: Physical Therapy

## 2011-05-17 ENCOUNTER — Ambulatory Visit: Payer: Medicare Other | Admitting: Physical Therapy

## 2011-05-19 ENCOUNTER — Ambulatory Visit: Payer: Medicare Other | Admitting: Physical Therapy

## 2011-05-21 ENCOUNTER — Encounter: Admitting: Physical Therapy

## 2011-05-24 ENCOUNTER — Ambulatory Visit: Payer: Medicare Other | Admitting: Physical Therapy

## 2011-05-26 ENCOUNTER — Ambulatory Visit: Payer: Medicare Other | Attending: Orthopedic Surgery | Admitting: Physical Therapy

## 2011-05-26 DIAGNOSIS — M25669 Stiffness of unspecified knee, not elsewhere classified: Secondary | ICD-10-CM | POA: Insufficient documentation

## 2011-05-26 DIAGNOSIS — IMO0001 Reserved for inherently not codable concepts without codable children: Secondary | ICD-10-CM | POA: Insufficient documentation

## 2011-05-26 DIAGNOSIS — R5381 Other malaise: Secondary | ICD-10-CM | POA: Insufficient documentation

## 2011-05-26 DIAGNOSIS — M25569 Pain in unspecified knee: Secondary | ICD-10-CM | POA: Insufficient documentation

## 2011-05-26 DIAGNOSIS — R262 Difficulty in walking, not elsewhere classified: Secondary | ICD-10-CM | POA: Insufficient documentation

## 2011-05-26 DIAGNOSIS — Z96659 Presence of unspecified artificial knee joint: Secondary | ICD-10-CM | POA: Insufficient documentation

## 2011-05-27 ENCOUNTER — Ambulatory Visit: Payer: Medicare Other | Admitting: Physical Therapy

## 2011-05-31 ENCOUNTER — Encounter: Admitting: Physical Therapy

## 2011-06-01 ENCOUNTER — Ambulatory Visit: Payer: Medicare Other | Admitting: *Deleted

## 2011-06-04 ENCOUNTER — Ambulatory Visit: Payer: Medicare Other | Admitting: *Deleted

## 2011-06-07 ENCOUNTER — Ambulatory Visit: Payer: Medicare Other | Admitting: Physical Therapy

## 2011-06-09 ENCOUNTER — Ambulatory Visit: Payer: Medicare Other | Admitting: Physical Therapy

## 2011-06-11 ENCOUNTER — Ambulatory Visit: Payer: Medicare Other | Admitting: Physical Therapy

## 2011-06-14 ENCOUNTER — Ambulatory Visit: Payer: Medicare Other | Admitting: Physical Therapy

## 2011-06-16 ENCOUNTER — Ambulatory Visit: Payer: Medicare Other | Admitting: Physical Therapy

## 2011-06-17 ENCOUNTER — Ambulatory Visit: Payer: Medicare Other | Admitting: *Deleted

## 2011-06-21 ENCOUNTER — Encounter: Admitting: Physical Therapy

## 2011-06-24 ENCOUNTER — Ambulatory Visit: Payer: Medicare Other | Admitting: *Deleted

## 2011-06-29 ENCOUNTER — Ambulatory Visit: Payer: Medicare Other | Attending: Orthopedic Surgery | Admitting: Physical Therapy

## 2011-06-29 DIAGNOSIS — R5381 Other malaise: Secondary | ICD-10-CM | POA: Insufficient documentation

## 2011-06-29 DIAGNOSIS — R262 Difficulty in walking, not elsewhere classified: Secondary | ICD-10-CM | POA: Insufficient documentation

## 2011-06-29 DIAGNOSIS — IMO0001 Reserved for inherently not codable concepts without codable children: Secondary | ICD-10-CM | POA: Insufficient documentation

## 2011-06-29 DIAGNOSIS — Z96659 Presence of unspecified artificial knee joint: Secondary | ICD-10-CM | POA: Insufficient documentation

## 2011-06-29 DIAGNOSIS — M25669 Stiffness of unspecified knee, not elsewhere classified: Secondary | ICD-10-CM | POA: Insufficient documentation

## 2011-06-29 DIAGNOSIS — M25569 Pain in unspecified knee: Secondary | ICD-10-CM | POA: Insufficient documentation

## 2011-07-01 ENCOUNTER — Ambulatory Visit: Payer: Medicare Other | Admitting: Physical Therapy

## 2011-07-06 ENCOUNTER — Encounter: Admitting: *Deleted

## 2011-07-08 ENCOUNTER — Ambulatory Visit: Payer: Medicare Other | Admitting: Physical Therapy

## 2011-07-13 ENCOUNTER — Encounter: Admitting: Physical Therapy

## 2011-07-15 ENCOUNTER — Ambulatory Visit: Payer: Medicare Other | Admitting: Physical Therapy

## 2011-07-20 ENCOUNTER — Ambulatory Visit: Payer: Medicare Other | Admitting: Physical Therapy

## 2011-07-22 ENCOUNTER — Encounter: Admitting: Physical Therapy

## 2011-07-27 ENCOUNTER — Ambulatory Visit: Payer: Medicare Other | Attending: Orthopedic Surgery | Admitting: Physical Therapy

## 2011-07-27 DIAGNOSIS — Z96659 Presence of unspecified artificial knee joint: Secondary | ICD-10-CM | POA: Insufficient documentation

## 2011-07-27 DIAGNOSIS — IMO0001 Reserved for inherently not codable concepts without codable children: Secondary | ICD-10-CM | POA: Insufficient documentation

## 2011-07-27 DIAGNOSIS — R5381 Other malaise: Secondary | ICD-10-CM | POA: Insufficient documentation

## 2011-07-27 DIAGNOSIS — M25669 Stiffness of unspecified knee, not elsewhere classified: Secondary | ICD-10-CM | POA: Insufficient documentation

## 2011-07-27 DIAGNOSIS — M25569 Pain in unspecified knee: Secondary | ICD-10-CM | POA: Insufficient documentation

## 2011-07-27 DIAGNOSIS — R262 Difficulty in walking, not elsewhere classified: Secondary | ICD-10-CM | POA: Insufficient documentation

## 2011-07-29 ENCOUNTER — Encounter: Admitting: Physical Therapy

## 2011-08-03 ENCOUNTER — Encounter: Admitting: Physical Therapy

## 2011-08-05 ENCOUNTER — Encounter: Admitting: Physical Therapy

## 2011-08-10 ENCOUNTER — Ambulatory Visit: Payer: Medicare Other | Admitting: Physical Therapy

## 2011-08-12 ENCOUNTER — Encounter: Admitting: *Deleted

## 2011-09-29 ENCOUNTER — Encounter: Payer: Self-pay | Admitting: Cardiology

## 2011-10-06 ENCOUNTER — Encounter: Payer: Self-pay | Admitting: Cardiology

## 2011-10-06 ENCOUNTER — Ambulatory Visit (INDEPENDENT_AMBULATORY_CARE_PROVIDER_SITE_OTHER): Payer: Medicare Other | Admitting: Cardiology

## 2011-10-06 DIAGNOSIS — E109 Type 1 diabetes mellitus without complications: Secondary | ICD-10-CM

## 2011-10-06 DIAGNOSIS — E785 Hyperlipidemia, unspecified: Secondary | ICD-10-CM

## 2011-10-06 DIAGNOSIS — I059 Rheumatic mitral valve disease, unspecified: Secondary | ICD-10-CM

## 2011-10-06 NOTE — Patient Instructions (Signed)
Continue current medications as listed.  Follow up in 1 year with Dr Hochrein.  You will receive a letter in the mail 2 months before you are due.  Please call us when you receive this letter to schedule your follow up appointment.  

## 2011-10-06 NOTE — Assessment & Plan Note (Signed)
I look through all available cardiology records from Dr. Nash Dimmer chart.  She has no symptoms suggestive of mitral regurgitation or symptomatic mitral valve prolapse. She has no new physical findings or EKG abnormalities. No further cardiovascular testing is suggested.

## 2011-10-06 NOTE — Assessment & Plan Note (Signed)
In October her LDL was 47 with an HDL of 40.9. She'll continue on the medications as listed.

## 2011-10-06 NOTE — Progress Notes (Signed)
HPI The patient presents as a new patient for me.  She previously saw Dr. Corinda Gubler and Dr. Diona Browner.  She had chest pain but normal coronaries on cath in 2007.  She had a history of MVP but the last echo that I can see described is from 2004 and Dr. Diona Browner mentions that there was no MVP or significant MR.  the patient returns for routine followup. Since she was last seen she's had no new cardiovascular problems. She denies any chest pressure, neck or arm discomfort. She denies any palpitations, presyncope or syncope. She has no shortness of breath, PND or orthopnea. She is limited by joint pains and fibromyalgia.  Allergies  Allergen Reactions  . Amoxicillin   . Hydrocodone   . Hydrocodone-Acetaminophen   . Hyoscyamine Sulfate   . Morphine     REACTION: hallucinations,GI upset  . Zolpidem Tartrate     REACTION: hallucinations    Current Outpatient Prescriptions  Medication Sig Dispense Refill  . aspirin 81 MG tablet Take 81 mg by mouth daily.        Marland Kitchen atorvastatin (LIPITOR) 20 MG tablet Take 20 mg by mouth daily.        . Calcium Carb-Cholecalciferol (CALCIUM 600+D3) 600-200 MG-UNIT TABS Take 1 tablet by mouth 2 (two) times daily.        . Cholestyramine POWD by Does not apply route 2 (two) times daily.        Marland Kitchen darifenacin (ENABLEX) 15 MG 24 hr tablet Take 15 mg by mouth daily.        Marland Kitchen desonide (DESOWEN) 0.05 % ointment Apply topically as directed.        . diphenhydrAMINE (SOMINEX) 25 MG tablet Take 25 mg by mouth 3 (three) times daily.        . DULoxetine (CYMBALTA) 60 MG capsule Take 60 mg by mouth daily.        Marland Kitchen esomeprazole (NEXIUM) 40 MG capsule Take 40 mg by mouth 2 (two) times daily.        Marland Kitchen estradiol (ESTRACE) 0.1 MG/GM vaginal cream Place 2 g vaginally as directed.        . fish oil-omega-3 fatty acids 1000 MG capsule Take 1 capsule by mouth daily.        Marland Kitchen glucose blood test strip 1 each by Other route as needed. Use as instructed       . insulin aspart  protamine-insulin aspart (NOVOLOG 70/30) (70-30) 100 UNIT/ML injection Inject into the skin as directed.        . Insulin Syringe-Needle U-100 (INSULIN SYRINGE .3CC/31GX5/16") 31G X 5/16" 0.3 ML MISC by Does not apply route as directed.        . Insulin Syringe-Needle U-100 (INSULIN SYRINGE 1CC/31GX5/16") 31G X 5/16" 1 ML MISC by Does not apply route as directed.        Marland Kitchen ketorolac (TORADOL) 10 MG tablet Take 10 mg by mouth every 6 (six) hours as needed.        . metoprolol succinate (TOPROL-XL) 25 MG 24 hr tablet Take 12.5 mg by mouth daily.        . Multiple Vitamin (MULTIVITAMIN) tablet Take 1 tablet by mouth daily.        . nitrofurantoin (MACRODANTIN) 100 MG capsule Take 100 mg by mouth daily.        . Probiotic Product (ALIGN) 4 MG CAPS Take 1 capsule by mouth daily.        . traZODone (DESYREL) 150 MG tablet Take  150 mg by mouth at bedtime.        . triamterene-hydrochlorothiazide (DYAZIDE) 37.5-25 MG per capsule Take 1 capsule by mouth every morning.        . vitamin B-12 (CYANOCOBALAMIN) 100 MCG tablet Take 50 mcg by mouth daily.        Marland Kitchen escitalopram (LEXAPRO) 20 MG tablet Take 40 mg by mouth daily.        . magnesium oxide (MAG-OX) 400 MG tablet Take 400 mg by mouth daily.          Past Medical History  Diagnosis Date  . Fibromyalgia   . IBS (irritable bowel syndrome)   . MVP (mitral valve prolapse)   . Diabetic retinopathy   . Osteoporosis   . Acute cystitis   . Depression   . Diabetes mellitus type II   . Hyperlipidemia   . Hypertension     Past Surgical History  Procedure Date  . Cataract extraction w/ intraocular lens implant 09/2003, 12/2003  . Cholecystectomy 2004  . Tubal ligation 1970  . Ankle surgery 02/2004  . Total knee arthroplasty     Left   ROS:  As stated in the HPI and negative for all other systems.  PHYSICAL EXAM BP 118/70  Pulse 81  Resp 18  Ht 5\' 3"  (1.6 m)  Wt 207 lb (93.895 kg)  BMI 36.67 kg/m2 GENERAL:  Well appearing HEENT:  Pupils  equal round and reactive, fundi not visualized, oral mucosa unremarkable NECK:  No jugular venous distention, waveform within normal limits, carotid upstroke brisk and symmetric, no bruits, no thyromegaly LYMPHATICS:  No cervical, inguinal adenopathy LUNGS:  Clear to auscultation bilaterally BACK:  No CVA tenderness CHEST:  Unremarkable HEART:  PMI not displaced or sustained,S1 and S2 within normal limits, no S3, no S4, no clicks, no rubs, no murmurs ABD:  Flat, positive bowel sounds normal in frequency in pitch, no bruits, no rebound, no guarding, no midline pulsatile mass, no hepatomegaly, no splenomegaly EXT:  2 plus pulses throughout, no edema, no cyanosis no clubbing SKIN:  No rashes no nodules NEURO:  Cranial nerves II through XII grossly intact, motor grossly intact throughout PSYCH:  Cognitively intact, oriented to person place and time  EKG:  Sinus rhythm, rate 81, axis within normal limits, intervals within normal limits, no acute ST-T wave changes.   ASSESSMENT AND PLAN

## 2011-10-06 NOTE — Assessment & Plan Note (Signed)
Her last hemoglobin A1c was 7.3. She is watching her diet and we discussed this.

## 2011-12-10 DIAGNOSIS — E119 Type 2 diabetes mellitus without complications: Secondary | ICD-10-CM | POA: Diagnosis not present

## 2011-12-10 DIAGNOSIS — E785 Hyperlipidemia, unspecified: Secondary | ICD-10-CM | POA: Diagnosis not present

## 2011-12-25 LAB — HM PAP SMEAR

## 2011-12-27 NOTE — Progress Notes (Signed)
Addended by: Judithe Modest D on: 12/27/2011 11:21 AM   Modules accepted: Orders

## 2012-01-03 DIAGNOSIS — H26499 Other secondary cataract, unspecified eye: Secondary | ICD-10-CM | POA: Diagnosis not present

## 2012-01-03 DIAGNOSIS — H3581 Retinal edema: Secondary | ICD-10-CM | POA: Diagnosis not present

## 2012-01-20 DIAGNOSIS — R070 Pain in throat: Secondary | ICD-10-CM | POA: Diagnosis not present

## 2012-01-20 DIAGNOSIS — R059 Cough, unspecified: Secondary | ICD-10-CM | POA: Diagnosis not present

## 2012-01-20 DIAGNOSIS — R05 Cough: Secondary | ICD-10-CM | POA: Diagnosis not present

## 2012-01-24 DIAGNOSIS — R197 Diarrhea, unspecified: Secondary | ICD-10-CM | POA: Diagnosis not present

## 2012-01-31 DIAGNOSIS — K5289 Other specified noninfective gastroenteritis and colitis: Secondary | ICD-10-CM | POA: Diagnosis not present

## 2012-02-15 DIAGNOSIS — N9489 Other specified conditions associated with female genital organs and menstrual cycle: Secondary | ICD-10-CM | POA: Diagnosis not present

## 2012-02-22 DIAGNOSIS — Z13820 Encounter for screening for osteoporosis: Secondary | ICD-10-CM | POA: Diagnosis not present

## 2012-02-22 DIAGNOSIS — Z1231 Encounter for screening mammogram for malignant neoplasm of breast: Secondary | ICD-10-CM | POA: Diagnosis not present

## 2012-02-22 LAB — HM DEXA SCAN: HM Dexa Scan: INCREASED

## 2012-02-22 LAB — HM MAMMOGRAPHY

## 2012-02-28 DIAGNOSIS — E11311 Type 2 diabetes mellitus with unspecified diabetic retinopathy with macular edema: Secondary | ICD-10-CM | POA: Diagnosis not present

## 2012-03-10 DIAGNOSIS — E119 Type 2 diabetes mellitus without complications: Secondary | ICD-10-CM | POA: Diagnosis not present

## 2012-03-10 DIAGNOSIS — IMO0001 Reserved for inherently not codable concepts without codable children: Secondary | ICD-10-CM | POA: Diagnosis not present

## 2012-03-10 DIAGNOSIS — E785 Hyperlipidemia, unspecified: Secondary | ICD-10-CM | POA: Diagnosis not present

## 2012-03-30 DIAGNOSIS — E119 Type 2 diabetes mellitus without complications: Secondary | ICD-10-CM | POA: Diagnosis not present

## 2012-03-30 DIAGNOSIS — E1159 Type 2 diabetes mellitus with other circulatory complications: Secondary | ICD-10-CM | POA: Diagnosis not present

## 2012-04-03 DIAGNOSIS — N3020 Other chronic cystitis without hematuria: Secondary | ICD-10-CM | POA: Diagnosis not present

## 2012-04-03 DIAGNOSIS — N3941 Urge incontinence: Secondary | ICD-10-CM | POA: Diagnosis not present

## 2012-04-03 DIAGNOSIS — N3946 Mixed incontinence: Secondary | ICD-10-CM | POA: Diagnosis not present

## 2012-04-06 DIAGNOSIS — Z0000 Encounter for general adult medical examination without abnormal findings: Secondary | ICD-10-CM | POA: Diagnosis not present

## 2012-04-20 DIAGNOSIS — H903 Sensorineural hearing loss, bilateral: Secondary | ICD-10-CM | POA: Diagnosis not present

## 2012-04-21 DIAGNOSIS — M545 Low back pain: Secondary | ICD-10-CM | POA: Diagnosis not present

## 2012-04-21 DIAGNOSIS — N390 Urinary tract infection, site not specified: Secondary | ICD-10-CM | POA: Diagnosis not present

## 2012-04-22 DIAGNOSIS — N390 Urinary tract infection, site not specified: Secondary | ICD-10-CM | POA: Diagnosis not present

## 2012-05-09 DIAGNOSIS — N3946 Mixed incontinence: Secondary | ICD-10-CM | POA: Diagnosis not present

## 2012-05-09 DIAGNOSIS — R351 Nocturia: Secondary | ICD-10-CM | POA: Diagnosis not present

## 2012-05-26 ENCOUNTER — Emergency Department (HOSPITAL_COMMUNITY): Payer: Medicare Other

## 2012-05-26 ENCOUNTER — Encounter (HOSPITAL_COMMUNITY): Payer: Self-pay | Admitting: Internal Medicine

## 2012-05-26 ENCOUNTER — Observation Stay (HOSPITAL_COMMUNITY)
Admission: EM | Admit: 2012-05-26 | Discharge: 2012-05-28 | Disposition: A | Discharge status: Home / Self Care | Payer: Medicare Other | Attending: Internal Medicine | Admitting: Internal Medicine

## 2012-05-26 DIAGNOSIS — R079 Chest pain, unspecified: Secondary | ICD-10-CM | POA: Diagnosis not present

## 2012-05-26 DIAGNOSIS — I10 Essential (primary) hypertension: Secondary | ICD-10-CM | POA: Diagnosis present

## 2012-05-26 DIAGNOSIS — E875 Hyperkalemia: Secondary | ICD-10-CM

## 2012-05-26 DIAGNOSIS — M810 Age-related osteoporosis without current pathological fracture: Secondary | ICD-10-CM

## 2012-05-26 DIAGNOSIS — M25519 Pain in unspecified shoulder: Secondary | ICD-10-CM | POA: Diagnosis not present

## 2012-05-26 DIAGNOSIS — E876 Hypokalemia: Secondary | ICD-10-CM

## 2012-05-26 DIAGNOSIS — E1169 Type 2 diabetes mellitus with other specified complication: Secondary | ICD-10-CM | POA: Diagnosis present

## 2012-05-26 DIAGNOSIS — N760 Acute vaginitis: Secondary | ICD-10-CM

## 2012-05-26 DIAGNOSIS — K589 Irritable bowel syndrome without diarrhea: Secondary | ICD-10-CM

## 2012-05-26 DIAGNOSIS — R072 Precordial pain: Secondary | ICD-10-CM | POA: Diagnosis not present

## 2012-05-26 DIAGNOSIS — N3000 Acute cystitis without hematuria: Secondary | ICD-10-CM

## 2012-05-26 DIAGNOSIS — R0789 Other chest pain: Secondary | ICD-10-CM | POA: Diagnosis not present

## 2012-05-26 DIAGNOSIS — IMO0001 Reserved for inherently not codable concepts without codable children: Secondary | ICD-10-CM | POA: Diagnosis not present

## 2012-05-26 DIAGNOSIS — E109 Type 1 diabetes mellitus without complications: Secondary | ICD-10-CM | POA: Diagnosis not present

## 2012-05-26 DIAGNOSIS — F3289 Other specified depressive episodes: Secondary | ICD-10-CM | POA: Insufficient documentation

## 2012-05-26 DIAGNOSIS — M545 Low back pain: Secondary | ICD-10-CM

## 2012-05-26 DIAGNOSIS — I152 Hypertension secondary to endocrine disorders: Secondary | ICD-10-CM | POA: Diagnosis present

## 2012-05-26 DIAGNOSIS — E785 Hyperlipidemia, unspecified: Secondary | ICD-10-CM | POA: Diagnosis not present

## 2012-05-26 DIAGNOSIS — L439 Lichen planus, unspecified: Secondary | ICD-10-CM

## 2012-05-26 DIAGNOSIS — R61 Generalized hyperhidrosis: Secondary | ICD-10-CM | POA: Diagnosis not present

## 2012-05-26 DIAGNOSIS — R32 Unspecified urinary incontinence: Secondary | ICD-10-CM

## 2012-05-26 DIAGNOSIS — E1121 Type 2 diabetes mellitus with diabetic nephropathy: Secondary | ICD-10-CM | POA: Diagnosis present

## 2012-05-26 DIAGNOSIS — G4700 Insomnia, unspecified: Secondary | ICD-10-CM

## 2012-05-26 DIAGNOSIS — F329 Major depressive disorder, single episode, unspecified: Secondary | ICD-10-CM | POA: Diagnosis not present

## 2012-05-26 DIAGNOSIS — R0602 Shortness of breath: Secondary | ICD-10-CM | POA: Insufficient documentation

## 2012-05-26 DIAGNOSIS — L98499 Non-pressure chronic ulcer of skin of other sites with unspecified severity: Secondary | ICD-10-CM

## 2012-05-26 DIAGNOSIS — I059 Rheumatic mitral valve disease, unspecified: Secondary | ICD-10-CM

## 2012-05-26 LAB — CBC
Hemoglobin: 11.8 g/dL — ABNORMAL LOW (ref 12.0–15.0)
MCH: 29.5 pg (ref 26.0–34.0)
MCV: 87.5 fL (ref 78.0–100.0)
RBC: 4 MIL/uL (ref 3.87–5.11)
WBC: 6.9 10*3/uL (ref 4.0–10.5)

## 2012-05-26 LAB — COMPREHENSIVE METABOLIC PANEL
ALT: 15 U/L (ref 0–35)
CO2: 31 mEq/L (ref 19–32)
Calcium: 9.1 mg/dL (ref 8.4–10.5)
Chloride: 98 mEq/L (ref 96–112)
Creatinine, Ser: 1.22 mg/dL — ABNORMAL HIGH (ref 0.50–1.10)
GFR calc Af Amer: 50 mL/min — ABNORMAL LOW (ref >90)
GFR calc non Af Amer: 43 mL/min — ABNORMAL LOW (ref >90)
Glucose, Bld: 234 mg/dL — ABNORMAL HIGH (ref 70–99)
Total Bilirubin: 0.2 mg/dL — ABNORMAL LOW (ref 0.3–1.2)

## 2012-05-26 LAB — URINALYSIS, ROUTINE W REFLEX MICROSCOPIC
Bilirubin Urine: NEGATIVE
Nitrite: NEGATIVE
Protein, ur: NEGATIVE mg/dL
Specific Gravity, Urine: 1.01 (ref 1.005–1.030)
Urobilinogen, UA: 0.2 mg/dL (ref 0.0–1.0)

## 2012-05-26 LAB — POCT I-STAT TROPONIN I: Troponin i, poc: 0 ng/mL (ref 0.00–0.08)

## 2012-05-26 MED ORDER — SODIUM CHLORIDE 0.9 % IV SOLN: INTRAVENOUS | Status: DC

## 2012-05-26 MED ORDER — HYDROMORPHONE HCL PF 1 MG/ML IJ SOLN
1.0000 mg | Freq: Once | INTRAMUSCULAR | Status: AC
  Administered 2012-05-26: 1 mg via INTRAVENOUS
  Filled 2012-05-26: qty 1

## 2012-05-26 MED ORDER — ONDANSETRON HCL 4 MG/2ML IJ SOLN
4.0000 mg | INTRAMUSCULAR | Status: AC
  Administered 2012-05-26: 4 mg via INTRAVENOUS
  Filled 2012-05-26: qty 2

## 2012-05-26 MED ORDER — IOHEXOL 350 MG/ML SOLN
100.0000 mL | Freq: Once | INTRAVENOUS | Status: AC | PRN
  Administered 2012-05-26: 100 mL via INTRAVENOUS

## 2012-05-26 MED ORDER — NITROGLYCERIN 0.4 MG SL SUBL
0.4000 mg | SUBLINGUAL_TABLET | SUBLINGUAL | Status: DC | PRN
  Administered 2012-05-26: 0.4 mg via SUBLINGUAL

## 2012-05-26 NOTE — ED Notes (Signed)
Ok with PA for patient to eat. Gave patient [place] sandwich, pretzels, applesauce, peanut butter crackers and sprite zero. No other needs assessed at this time. Will continue to monitor.

## 2012-05-26 NOTE — ED Notes (Signed)
CBG 174  

## 2012-05-26 NOTE — ED Provider Notes (Addendum)
History     CSN: [id number]  Arrival date & time 05/26/12  1636   First MD Initiated Contact with Patient 05/26/12 1638      Chief Complaint  Patient presents with  . Chest Pain    (Consider location/radiation/quality/duration/timing/severity/associated sxs/prior treatment) HPI  71 y/o female INAD c/o aching pressure like sensation, constant 6/10, right sternal pain radiating to right scapula onset at 11:30 am today while preparing food.  Denies SOB, N/V. Pt took 325mg  ASA without relief of pain at 12:30. Pt had 1 NTG en route with no relief.   RF: DM, HTN, High cholesterol,   Denies FH or h/o smoking  Cardiac cath in 2007, stress test 2007   Cardiologist: Rollene Rotunda at [facility]   Past Medical History  Diagnosis Date  . Fibromyalgia   . IBS (irritable bowel syndrome)   . MVP (mitral valve prolapse)   . Diabetic retinopathy   . Osteoporosis   . Acute cystitis   . Depression   . Diabetes mellitus type II   . Hyperlipidemia   . Hypertension     Past Surgical History  Procedure Date  . Cataract extraction w/ intraocular lens implant 09/2003, 12/2003  . Cholecystectomy 2004  . Tubal ligation 1970  . Ankle surgery 02/2004  . Total knee arthroplasty     Left    Family History  Problem Relation Age of Onset  . Diabetes Other     History  Substance Use Topics  . Smoking status: Never Smoker   . Smokeless tobacco: Not on file  . Alcohol Use: No    OB History    Grav Para Term Preterm Abortions TAB SAB Ect Mult Living                  Review of Systems  Constitutional: Negative for fever.  Respiratory: Negative for shortness of breath.   Cardiovascular: Positive for chest pain.  Gastrointestinal: Negative for nausea, vomiting, abdominal pain and diarrhea.  All other systems reviewed and are negative.    Allergies  Amoxicillin; Hydrocodone; Hydrocodone-acetaminophen; Hyoscyamine sulfate; Morphine; and Zolpidem tartrate  Home Medications   Current  Outpatient Rx  Name Route Sig Dispense Refill  . ASPIRIN 81 MG PO TABS Oral Take 81 mg by mouth daily.      . ATORVASTATIN CALCIUM 20 MG PO TABS Oral Take 20 mg by mouth daily.      Marland Kitchen CALCIUM CARB-CHOLECALCIFEROL 600-200 MG-UNIT PO TABS Oral Take 1 tablet by mouth 2 (two) times daily.      Dia Sitter POWD Does not apply by Does not apply route 2 (two) times daily.      Marland Kitchen DARIFENACIN HYDROBROMIDE ER 15 MG PO TB24 Oral Take 15 mg by mouth daily.      . DESONIDE 0.05 % EX OINT Topical Apply topically as directed.      Marland Kitchen DIPHENHYDRAMINE HCL (SLEEP) 25 MG PO TABS Oral Take 25 mg by mouth 3 (three) times daily.      . DULOXETINE HCL 60 MG PO CPEP Oral Take 60 mg by mouth daily.      Marland Kitchen ESCITALOPRAM OXALATE 20 MG PO TABS Oral Take 40 mg by mouth daily.      Marland Kitchen ESOMEPRAZOLE MAGNESIUM 40 MG PO CPDR Oral Take 40 mg by mouth 2 (two) times daily.      Marland Kitchen ESTRADIOL 0.1 MG/GM VA CREA Vaginal Place 2 g vaginally as directed.      . OMEGA-3 FATTY ACIDS 1000 MG PO  CAPS Oral Take 1 capsule by mouth daily.      Marland Kitchen GLUCOSE BLOOD VI STRP Other 1 each by Other route as needed. Use as instructed     . INSULIN ASPART PROT & ASPART (70-30) 100 UNIT/ML [place] SUSP Subcutaneous Inject into the skin as directed.      . INSULIN SYRINGE 31G X 5/16" 0.3 ML MISC Does not apply by Does not apply route as directed.      . INSULIN SYRINGE 31G X 5/16" 1 ML MISC Does not apply by Does not apply route as directed.      Marland Kitchen KETOROLAC TROMETHAMINE 10 MG PO TABS Oral Take 10 mg by mouth every 6 (six) hours as needed.      Marland Kitchen MAGNESIUM OXIDE 400 MG PO TABS Oral Take 400 mg by mouth daily.      Marland Kitchen METOPROLOL SUCCINATE ER 25 MG PO TB24 Oral Take 12.5 mg by mouth daily.      Marland Kitchen ONE-DAILY MULTI VITAMINS PO TABS Oral Take 1 tablet by mouth daily.      Marland Kitchen NITROFURANTOIN MACROCRYSTAL 100 MG PO CAPS Oral Take 100 mg by mouth daily.      Marland Kitchen ALIGN 4 MG PO CAPS Oral Take 1 capsule by mouth daily.      . TRAZODONE HCL 150 MG PO TABS Oral Take 150 mg by  mouth at bedtime.      . TRIAMTERENE-HCTZ 37.5-25 MG PO CAPS Oral Take 1 capsule by mouth every morning.      Marland Kitchen VITAMIN B-12 100 MCG PO TABS Oral Take 50 mcg by mouth daily.        There were no vitals taken for this visit.  Physical Exam  Nursing note and vitals reviewed. Constitutional: She is oriented to person, place, and time. She appears well-developed and well-nourished. No distress.  HENT:  Head: Normocephalic.  Mouth/Throat: Oropharynx is clear and moist.  Eyes: Conjunctivae and EOM are normal.  Neck: Normal range of motion.  Cardiovascular: Normal rate, regular rhythm, normal heart sounds and intact distal pulses.  Exam reveals no gallop and no friction rub.   No murmur heard. Pulmonary/Chest: Effort normal and breath sounds normal. No respiratory distress. She has no wheezes. She has no rales. She exhibits no tenderness.  Abdominal: Soft. Bowel sounds are normal.  Musculoskeletal: Normal range of motion. She exhibits no edema.  Neurological: She is alert and oriented to person, place, and time.  Psychiatric: She has a normal mood and affect.    There were no vitals filed for this visit.   ED Course  Procedures (including critical care time)  Labs Reviewed  CBC - Abnormal; Notable for the following:    Hemoglobin 11.8 (*)     HCT 35.0 (*)     All other components within normal limits  COMPREHENSIVE METABOLIC PANEL - Abnormal; Notable for the following:    Glucose, Bld 234 (*)     BUN 26 (*)     Creatinine, Ser 1.22 (*)     Albumin 3.3 (*)     Total Bilirubin 0.2 (*)     GFR calc non Af Amer 43 (*)     GFR calc Af Amer 50 (*)     All other components within normal limits  URINALYSIS, ROUTINE W REFLEX MICROSCOPIC  POCT I-STAT TROPONIN I   Dg Chest 2 View  05/26/2012  *RADIOLOGY REPORT*  Clinical Data: Chest pain.  CHEST - 2 VIEW  Comparison: 07/10/2010  Findings: The cardiomediastinal  silhouette is unremarkable. The lungs are clear. There is no evidence of focal  airspace disease, pulmonary edema, suspicious pulmonary nodule/mass, pleural effusion, or pneumothorax. No acute bony abnormalities are identified.  IMPRESSION: No evidence of active cardiopulmonary disease.  Original Report Authenticated By: Rosendo Gros, M.D.     1. Chest pain   2. Depressive disorder, not elsewhere classified   3. Type I (juvenile type) diabetes mellitus without mention of complication, not stated as uncontrolled   4. Unspecified essential hypertension       MDM  Troponin negative.    Date: 05/26/2012  Rate: 80  Rhythm: normal sinus rhythm  QRS Axis: normal  Intervals: normal  ST/T Wave abnormalities: normal  Conduction Disutrbances:none  Narrative Interpretation:   Old EKG Reviewed: unchanged  0635P: Pt resting comfortably in room, Pain is 510 following NTG SL, I will order 1 mg dilaudid   Consult from Cardiologist Dr. Eden Emms, who advises admission to medicine for 24 hour rule out and better glucose control  0745P: Pt's pain is 2/10 after receiving Dilaudid. , she is open to admission   Wynetta Emery, PA-C 05/27/12 1500  Wynetta Emery, PA-C 06/29/12 1103

## 2012-05-26 NOTE — ED Notes (Signed)
Patient in CT

## 2012-05-26 NOTE — ED Notes (Signed)
Patient is resting comfortably. 

## 2012-05-26 NOTE — ED Notes (Signed)
Patient back from CT.

## 2012-05-26 NOTE — ED Notes (Signed)
Rt. Sided and rt.shoulder pain that is reproducible with palpation and inspiration. Took 324 mg asa ~ 1300. S/s 2 hrs before. ems gave x1 ntg. Sl. 0.4mg  with no relief. Pain level 6/10.

## 2012-05-26 NOTE — ED Notes (Signed)
Patient denies pain and is resting comfortably.  

## 2012-05-27 DIAGNOSIS — E109 Type 1 diabetes mellitus without complications: Secondary | ICD-10-CM | POA: Diagnosis not present

## 2012-05-27 DIAGNOSIS — R079 Chest pain, unspecified: Secondary | ICD-10-CM | POA: Diagnosis not present

## 2012-05-27 DIAGNOSIS — I369 Nonrheumatic tricuspid valve disorder, unspecified: Secondary | ICD-10-CM

## 2012-05-27 LAB — COMPREHENSIVE METABOLIC PANEL
ALT: 14 U/L (ref 0–35)
Calcium: 8.7 mg/dL (ref 8.4–10.5)
Creatinine, Ser: 1.19 mg/dL — ABNORMAL HIGH (ref 0.50–1.10)
GFR calc Af Amer: 52 mL/min — ABNORMAL LOW (ref >90)
Glucose, Bld: 248 mg/dL — ABNORMAL HIGH (ref 70–99)
Sodium: 134 mEq/L — ABNORMAL LOW (ref 135–145)
Total Protein: 5.7 g/dL — ABNORMAL LOW (ref 6.0–8.3)

## 2012-05-27 LAB — CARDIAC PANEL(CRET KIN+CKTOT+MB+TROPI)
CK, MB: 2.8 ng/mL (ref 0.3–4.0)
CK, MB: 2.6 ng/mL (ref 0.3–4.0)
CK, MB: 2.5 ng/mL (ref 0.3–4.0)
Relative Index: INVALID (ref 0.0–2.5)
Total CK: 69 U/L (ref 7–177)
Troponin I: <0.3 ng/mL (ref <0.30)

## 2012-05-27 LAB — GLUCOSE, CAPILLARY: Glucose-Capillary: 220 mg/dL — ABNORMAL HIGH (ref 70–99)

## 2012-05-27 LAB — CBC
HCT: 33.7 % — ABNORMAL LOW (ref 36.0–46.0)
Hemoglobin: 11.2 g/dL — ABNORMAL LOW (ref 12.0–15.0)
MCHC: 33.2 g/dL (ref 30.0–36.0)
RBC: 3.8 MIL/uL — ABNORMAL LOW (ref 3.87–5.11)
WBC: 5.4 10*3/uL (ref 4.0–10.5)

## 2012-05-27 LAB — PRO B NATRIURETIC PEPTIDE: Pro B Natriuretic peptide (BNP): 77 pg/mL (ref 0–125)

## 2012-05-27 MED ORDER — ONDANSETRON HCL 4 MG PO TABS: 4.0000 mg | ORAL_TABLET | Freq: Four times a day (QID) | ORAL | Status: DC | PRN

## 2012-05-27 MED ORDER — ENOXAPARIN SODIUM 40 MG/0.4ML ~~LOC~~ SOLN
40.0000 mg | SUBCUTANEOUS | Status: DC
  Administered 2012-05-28: 40 mg via SUBCUTANEOUS
  Filled 2012-05-27 (×2): qty 0.4

## 2012-05-27 MED ORDER — ASPIRIN 81 MG PO TABS: 81.0000 mg | ORAL_TABLET | Freq: Every day | ORAL | Status: DC

## 2012-05-27 MED ORDER — DULOXETINE HCL 60 MG PO CPEP
60.0000 mg | ORAL_CAPSULE | Freq: Every day | ORAL | Status: DC
  Administered 2012-05-27 – 2012-05-28 (×2): 60 mg via ORAL
  Filled 2012-05-27 (×2): qty 1

## 2012-05-27 MED ORDER — INSULIN ASPART PROT & ASPART (70-30 MIX) 100 UNIT/ML ~~LOC~~ SUSP: 45.0000 [IU] | Freq: Two times a day (BID) | SUBCUTANEOUS | Status: DC

## 2012-05-27 MED ORDER — PANTOPRAZOLE SODIUM 40 MG PO TBEC
40.0000 mg | DELAYED_RELEASE_TABLET | Freq: Every day | ORAL | Status: DC
  Administered 2012-05-27: 40 mg via ORAL
  Filled 2012-05-27: qty 1

## 2012-05-27 MED ORDER — NAPROXEN 250 MG PO TABS
440.0000 mg | ORAL_TABLET | Freq: Two times a day (BID) | ORAL | Status: DC
  Administered 2012-05-27 – 2012-05-28 (×3): 500 mg via ORAL
  Filled 2012-05-27 (×5): qty 2

## 2012-05-27 MED ORDER — TRAZODONE HCL 150 MG PO TABS
150.0000 mg | ORAL_TABLET | Freq: Every day | ORAL | Status: DC
  Administered 2012-05-27 (×2): 150 mg via ORAL
  Filled 2012-05-27 (×3): qty 1

## 2012-05-27 MED ORDER — SODIUM CHLORIDE 0.9 % IJ SOLN
3.0000 mL | Freq: Two times a day (BID) | INTRAMUSCULAR | Status: DC
  Administered 2012-05-27 – 2012-05-28 (×3): 3 mL via INTRAVENOUS

## 2012-05-27 MED ORDER — ACETAMINOPHEN 325 MG PO TABS
650.0000 mg | ORAL_TABLET | Freq: Four times a day (QID) | ORAL | Status: DC | PRN
  Administered 2012-05-27: 650 mg via ORAL
  Filled 2012-05-27: qty 1
  Filled 2012-05-27: qty 2

## 2012-05-27 MED ORDER — INSULIN ASPART 100 UNIT/ML ~~LOC~~ SOLN: 2.0000 [IU] | Freq: Three times a day (TID) | SUBCUTANEOUS | Status: DC | PRN

## 2012-05-27 MED ORDER — DIPHENHYDRAMINE HCL 25 MG PO CAPS
25.0000 mg | ORAL_CAPSULE | Freq: Three times a day (TID) | ORAL | Status: DC
  Administered 2012-05-27 – 2012-05-28 (×3): 25 mg via ORAL
  Filled 2012-05-27 (×6): qty 1

## 2012-05-27 MED ORDER — ACETAMINOPHEN 650 MG RE SUPP: 650.0000 mg | Freq: Four times a day (QID) | RECTAL | Status: DC | PRN

## 2012-05-27 MED ORDER — SODIUM CHLORIDE 0.9 % IV SOLN
INTRAVENOUS | Status: DC
  Administered 2012-05-27: 02:00:00 via INTRAVENOUS

## 2012-05-27 MED ORDER — ATORVASTATIN CALCIUM 10 MG PO TABS
10.0000 mg | ORAL_TABLET | Freq: Every day | ORAL | Status: DC
  Administered 2012-05-27 (×2): 10 mg via ORAL
  Filled 2012-05-27 (×3): qty 1

## 2012-05-27 MED ORDER — TRIAMTERENE-HCTZ 37.5-25 MG PO CAPS
1.0000 | ORAL_CAPSULE | Freq: Every day | ORAL | Status: DC
  Administered 2012-05-27 – 2012-05-28 (×2): 1 via ORAL
  Filled 2012-05-27 (×2): qty 1

## 2012-05-27 MED ORDER — METOPROLOL SUCCINATE 12.5 MG HALF TABLET
12.5000 mg | ORAL_TABLET | Freq: Every day | ORAL | Status: DC
  Administered 2012-05-27 (×2): 12.5 mg via ORAL
  Filled 2012-05-27 (×3): qty 1

## 2012-05-27 MED ORDER — ENOXAPARIN SODIUM 40 MG/0.4ML ~~LOC~~ SOLN
40.0000 mg | SUBCUTANEOUS | Status: DC
  Administered 2012-05-27: 40 mg via SUBCUTANEOUS
  Filled 2012-05-27: qty 0.4

## 2012-05-27 MED ORDER — ADULT MULTIVITAMIN W/MINERALS CH
0.5000 | ORAL_TABLET | Freq: Two times a day (BID) | ORAL | Status: DC
  Administered 2012-05-27 – 2012-05-28 (×4): 1 via ORAL
  Filled 2012-05-27 (×5): qty 1

## 2012-05-27 MED ORDER — INSULIN ASPART PROT & ASPART (70-30 MIX) 100 UNIT/ML ~~LOC~~ SUSP
45.0000 [IU] | Freq: Every day | SUBCUTANEOUS | Status: DC
  Administered 2012-05-27: 45 [IU] via SUBCUTANEOUS

## 2012-05-27 MED ORDER — ASPIRIN EC 81 MG PO TBEC
81.0000 mg | DELAYED_RELEASE_TABLET | Freq: Every day | ORAL | Status: DC
  Administered 2012-05-27 – 2012-05-28 (×2): 81 mg via ORAL
  Filled 2012-05-27 (×2): qty 1

## 2012-05-27 MED ORDER — DARIFENACIN HYDROBROMIDE ER 15 MG PO TB24
15.0000 mg | ORAL_TABLET | Freq: Every day | ORAL | Status: DC
  Administered 2012-05-27 – 2012-05-28 (×2): 15 mg via ORAL
  Filled 2012-05-27 (×2): qty 1

## 2012-05-27 MED ORDER — INSULIN ASPART PROT & ASPART (70-30 MIX) 100 UNIT/ML ~~LOC~~ SUSP
50.0000 [IU] | Freq: Every day | SUBCUTANEOUS | Status: DC
  Administered 2012-05-27 – 2012-05-28 (×2): 50 [IU] via SUBCUTANEOUS
  Filled 2012-05-27: qty 3

## 2012-05-27 MED ORDER — ONDANSETRON HCL 4 MG/2ML IJ SOLN: 4.0000 mg | Freq: Four times a day (QID) | INTRAMUSCULAR | Status: DC | PRN

## 2012-05-27 MED ORDER — NITROFURANTOIN MACROCRYSTAL 100 MG PO CAPS
100.0000 mg | ORAL_CAPSULE | Freq: Every day | ORAL | Status: DC
  Administered 2012-05-27 – 2012-05-28 (×2): 100 mg via ORAL
  Filled 2012-05-27 (×2): qty 1

## 2012-05-27 MED ORDER — ESCITALOPRAM OXALATE 20 MG PO TABS
40.0000 mg | ORAL_TABLET | Freq: Every day | ORAL | Status: DC
  Administered 2012-05-27 – 2012-05-28 (×2): 40 mg via ORAL
  Filled 2012-05-27 (×2): qty 2

## 2012-05-27 MED ORDER — ENOXAPARIN SODIUM 40 MG/0.4ML ~~LOC~~ SOLN: 40.0000 mg | SUBCUTANEOUS | Status: DC

## 2012-05-27 NOTE — Progress Notes (Signed)
[facility] PROGRESS NOTE  [name] ZOX:[id number] DOB: 1940-11-11 DOA: 05/26/2012 PCP: Katherine Roan  Assessment/Plan:  Chest pain Patient admitted with chest pain rule out. CT Angio of the chest is negative. Cardiac markers are negative. Discussed with cardiology Dr. Patty Sermons inpatient versus outpatient workup with patient having multiple risk factors. Her chest pain is atypical. Per discussion with Dr. Patty Sermons if her cardiac markers are negative then patient should followup with cardiology outpatient for stress test.   DIABETES MELLITUS, TYPE I Continue insulin   HYPERLIPIDEMIA Continues statin   HYPERTENSION Blood pressure is stable  Code Status: Full code Family Communication: None  Disposition Plan: Home tomorrow if rule out  Molli Posey, MD  [facility] Pager [phone]  If 8PM-8AM, please contact night-coverage [email or web address] Password [email or web address] 05/27/2012, 5:18 PM   LOS: 1 day   Brief narrative: Patient is a 71 year old female with past medical history significant for hypertension hyperlipidemia diabetes and obesity. Patient presented with atypical chest pain. Patient so far ruled out with enzymes. CT Angela chest negative. Discussed with Dr. Patty Sermons cardiology. If patient rules out she could follow up outpatient for stress test. Will call and schedule outpatient stress test.  Consultants:  Phone consultation with cardiology  Procedures: CT angiogram of the chest negative  Antibiotics:  None ?  HPI/Subjective: Patient without any complaints. She is tender on the chest wall area on the right.  Objective:   Intake/Output Summary (Last 24 hours) at 05/27/12 1718 Last data filed at 05/27/12 0438  Gross per 24 hour  Intake      0 ml  Output   1650 ml  Net  -1650 ml    Exam:   General:  Patient is sitting up in bed does not seem to be in any acute distress.  Cardiovascular: Regular Respiratory: Clear to auscultation  bilaterally Extremities: No edema Data Reviewed: Basic Metabolic Panel:  Lab 05/27/12 8295 05/26/12 1704  NA 134* 135  K 4.1 3.9  CL 98 98  CO2 28 31  GLUCOSE 248* 234*  BUN 20 26*  CREATININE 1.19* 1.22*  CALCIUM 8.7 9.1  MG -- --  PHOS -- --   Liver Function Tests:  Lab 05/27/12 0645 05/26/12 1704  AST 18 21  ALT 14 15  ALKPHOS 57 57  BILITOT 0.2* 0.2*  PROT 5.7* 6.3  ALBUMIN 2.9* 3.3*   No results found for this basename: LIPASE:5,AMYLASE:5 in the last 168 hours No results found for this basename: AMMONIA:5 in the last 168 hours CBC:  Lab 05/27/12 0645 05/26/12 1704  WBC 5.4 6.9  NEUTROABS -- --  HGB 11.2* 11.8*  HCT 33.7* 35.0*  MCV 88.7 87.5  PLT 140* 152   Cardiac Enzymes:  Lab 05/27/12 0853 05/27/12 0137  CKTOTAL 61 65  CKMB 2.6 2.8  CKMBINDEX -- --  TROPONINI <0.30 <0.30   CBG:  Lab 05/27/12 1546 05/27/12 0615 05/26/12 2016  GLUCAP 220* 212* 175*    Studies: Dg Chest 2 View  05/26/2012  *RADIOLOGY REPORT*  Clinical Data: Chest pain.  CHEST - 2 VIEW  Comparison: 07/10/2010  Findings: The cardiomediastinal silhouette is unremarkable. The lungs are clear. There is no evidence of focal airspace disease, pulmonary edema, suspicious pulmonary nodule/mass, pleural effusion, or pneumothorax. No acute bony abnormalities are identified.  IMPRESSION: No evidence of active cardiopulmonary disease.  Original Report Authenticated By: Rosendo Gros, M.D.   Ct Angio Chest Pe W/cm &/or Wo Cm  05/26/2012  *RADIOLOGY REPORT*  Clinical Data:  Right side and right shoulder pain with inspiration.  CT ANGIOGRAPHY CHEST  Technique:  Multidetector CT imaging of the chest using the standard protocol during bolus administration of intravenous contrast. Multiplanar reconstructed images including MIPs were obtained and reviewed to evaluate the vascular anatomy.  Contrast: OMNIPAQUE IOHEXOL 350 MG/ML SOLN  Comparison: None.  Findings: Technically adequate study with good  opacification of the central and segmental pulmonary arteries.  No focal filling defects demonstrated.  No evidence of significant pulmonary embolus.  Normal heart size.  Calcification in the region of the mitral valve annulus.  Normal caliber thoracic aorta.  Mild aortic calcification.  No significant lymphadenopathy in the chest.  Gas filled esophagus suggesting reflux or dysmotility.  No pleural effusion.  No pneumothorax.  Visualization of the lungs is limited due to respiratory motion artifact but there is no obvious consolidation or interstitial change.  Degenerative changes in the spine.  Focal nodular enlargement of the left thyroid gland, measuring about 1.3 cm diameter.  Visualized upper abdominal organs are unremarkable.  IMPRESSION: No evidence of significant pulmonary embolus.  Original Report Authenticated By: Marlon Pel, M.D.    Scheduled Meds:   . aspirin EC  81 mg Oral Daily  . atorvastatin  10 mg Oral QHS  . darifenacin  15 mg Oral Daily  . diphenhydrAMINE  25 mg Oral TID  . DULoxetine  60 mg Oral Daily  . enoxaparin (LOVENOX) injection  40 mg Subcutaneous Q24H  . escitalopram  40 mg Oral Daily  .  HYDROmorphone (DILAUDID) injection  1 mg Intravenous Once  . insulin aspart protamine-insulin aspart  45 Units Subcutaneous Q supper  . insulin aspart protamine-insulin aspart  50 Units Subcutaneous Q breakfast  . metoprolol succinate  12.5 mg Oral QHS  . multivitamin with minerals  1 tablet Oral BID  . naproxen  500 mg Oral BID WC  . nitrofurantoin  100 mg Oral Daily  . ondansetron (ZOFRAN) IV  4 mg Intravenous STAT  . pantoprazole  40 mg Oral Q1200  . sodium chloride  3 mL Intravenous Q12H  . traZODone  150 mg Oral QHS  . triamterene-hydrochlorothiazide  1 each Oral Daily  . DISCONTD: aspirin  81 mg Oral Daily  . DISCONTD: enoxaparin (LOVENOX) injection  40 mg Subcutaneous Q24H  . DISCONTD: enoxaparin (LOVENOX) injection  40 mg Subcutaneous Q24H  . DISCONTD: insulin  aspart protamine-insulin aspart  45-50 Units Subcutaneous BID WC   Continuous Infusions:   . sodium chloride 75 mL/hr at 05/27/12 0154  . DISCONTD: sodium chloride       Time Spent: 25 min

## 2012-05-27 NOTE — H&P (Signed)
[name] is an 71 y.o. female.   Chief Complaint: Chest pain HPI: A 71 year old female with multiple medical problems including fibromyalgia depression diabetes hypertension and hyperlipidemia presenting with right-sided chest pain for one day. Pain was rated as 7/10 mainly on the right side of the chest radiating to have right shoulder. Associated with some mild diaphoresis. Denied nausea vomiting. No shortness of breath. Patient is worried about possibilities of how heart being affected. She denied any recent trauma. The pain is sharp. No relieving factor but aggravated by motion. Is not reproducible by pressure. She has risk factors for coronary artery disease including diabetes hyperlipidemia and hypertension hence is being admitted for further workup.  Past Medical History  Diagnosis Date  . Fibromyalgia   . IBS (irritable bowel syndrome)   . MVP (mitral valve prolapse)   . Diabetic retinopathy   . Osteoporosis   . Acute cystitis   . Depression   . Diabetes mellitus type II   . Hyperlipidemia   . Hypertension     Past Surgical History  Procedure Date  . Cataract extraction w/ intraocular lens implant 09/2003, 12/2003  . Cholecystectomy 2004  . Tubal ligation 1970  . Ankle surgery 02/2004  . Total knee arthroplasty     Left    Family History  Problem Relation Age of Onset  . Diabetes Other    Social History:  reports that she has never smoked. She does not have any smokeless tobacco history on file. She reports that she does not drink alcohol. Her drug history not on file.  Allergies:  Allergies  Allergen Reactions  . Amoxicillin   . Hydrocodone   . Hydrocodone-Acetaminophen Other (See Comments)    hallucinations  . Hyoscyamine Sulfate   . Morphine     REACTION: hallucinations,GI upset  . Tramadol   . Zolpidem Tartrate     REACTION: hallucinations  . Prednisone Rash and Other (See Comments)    Hyperglycemia   . Sulfa Antibiotics Rash     (Not in a  hospital admission)  Results for orders placed during the hospital encounter of 05/26/12 (from the past 48 hour(s))  CBC     Status: Abnormal   Collection Time   05/26/12  5:04 PM      Component Value Range Comment   WBC 6.9  4.0 - 10.5 K/uL    RBC 4.00  3.87 - 5.11 MIL/uL    Hemoglobin 11.8 (*) 12.0 - 15.0 g/dL    HCT 16.1 (*) 09.6 - 46.0 %    MCV 87.5  78.0 - 100.0 fL    MCH 29.5  26.0 - 34.0 pg    MCHC 33.7  30.0 - 36.0 g/dL    RDW 04.5  40.9 - 81.1 %    Platelets 152  150 - 400 K/uL   COMPREHENSIVE METABOLIC PANEL     Status: Abnormal   Collection Time   05/26/12  5:04 PM      Component Value Range Comment   Sodium 135  135 - 145 mEq/L    Potassium 3.9  3.5 - 5.1 mEq/L    Chloride 98  96 - 112 mEq/L    CO2 31  19 - 32 mEq/L    Glucose, Bld 234 (*) 70 - 99 mg/dL    BUN 26 (*) 6 - 23 mg/dL    Creatinine, Ser 9.14 (*) 0.50 - 1.10 mg/dL    Calcium 9.1  8.4 - 78.2 mg/dL    Total Protein  6.3  6.0 - 8.3 g/dL    Albumin 3.3 (*) 3.5 - 5.2 g/dL    AST 21  0 - 37 U/L    ALT 15  0 - 35 U/L    Alkaline Phosphatase 57  39 - 117 U/L    Total Bilirubin 0.2 (*) 0.3 - 1.2 mg/dL    GFR calc non Af Amer 43 (*) >90 mL/min    GFR calc Af Amer 50 (*) >90 mL/min   URINALYSIS, ROUTINE W REFLEX MICROSCOPIC     Status: Normal   Collection Time   05/26/12  5:11 PM      Component Value Range Comment   Color, Urine YELLOW  YELLOW    APPearance CLEAR  CLEAR    Specific Gravity, Urine 1.010  1.005 - 1.030    pH 6.0  5.0 - 8.0    Glucose, UA NEGATIVE  NEGATIVE mg/dL    Hgb urine dipstick NEGATIVE  NEGATIVE    Bilirubin Urine NEGATIVE  NEGATIVE    Ketones, ur NEGATIVE  NEGATIVE mg/dL    Protein, ur NEGATIVE  NEGATIVE mg/dL    Urobilinogen, UA 0.2  0.0 - 1.0 mg/dL    Nitrite NEGATIVE  NEGATIVE    Leukocytes, UA NEGATIVE  NEGATIVE MICROSCOPIC NOT DONE ON URINES WITH NEGATIVE PROTEIN, BLOOD, LEUKOCYTES, NITRITE, OR GLUCOSE <1000 mg/dL.  POCT I-STAT TROPONIN I     Status: Normal   Collection Time    05/26/12  5:24 PM      Component Value Range Comment   Troponin i, poc 0.00  0.00 - 0.08 ng/mL    Comment 3            D-DIMER, QUANTITATIVE     Status: Abnormal   Collection Time   05/26/12  8:00 PM      Component Value Range Comment   D-Dimer, Quant 1.42 (*) 0.00 - 0.48 ug/mL-FEU   GLUCOSE, CAPILLARY     Status: Abnormal   Collection Time   05/26/12  8:16 PM      Component Value Range Comment   Glucose-Capillary 175 (*) 70 - 99 mg/dL   POCT I-STAT TROPONIN I     Status: Normal   Collection Time   05/26/12  8:56 PM      Component Value Range Comment   Troponin i, poc 0.00  0.00 - 0.08 ng/mL    Comment 3            POCT I-STAT TROPONIN I     Status: Normal   Collection Time   05/26/12 10:55 PM      Component Value Range Comment   Troponin i, poc 0.00  0.00 - 0.08 ng/mL    Comment 3             Dg Chest 2 View  05/26/2012  *RADIOLOGY REPORT*  Clinical Data: Chest pain.  CHEST - 2 VIEW  Comparison: 07/10/2010  Findings: The cardiomediastinal silhouette is unremarkable. The lungs are clear. There is no evidence of focal airspace disease, pulmonary edema, suspicious pulmonary nodule/mass, pleural effusion, or pneumothorax. No acute bony abnormalities are identified.  IMPRESSION: No evidence of active cardiopulmonary disease.  Original Report Authenticated By: Rosendo Gros, M.D.   Ct Angio Chest Pe W/cm &/or Wo Cm  05/26/2012  *RADIOLOGY REPORT*  Clinical Data: Right side and right shoulder pain with inspiration.  CT ANGIOGRAPHY CHEST  Technique:  Multidetector CT imaging of the chest using the standard protocol during bolus administration of intravenous  contrast. Multiplanar reconstructed images including MIPs were obtained and reviewed to evaluate the vascular anatomy.  Contrast: OMNIPAQUE IOHEXOL 350 MG/ML SOLN  Comparison: None.  Findings: Technically adequate study with good opacification of the central and segmental pulmonary arteries.  No focal filling defects demonstrated.  No evidence  of significant pulmonary embolus.  Normal heart size.  Calcification in the region of the mitral valve annulus.  Normal caliber thoracic aorta.  Mild aortic calcification.  No significant lymphadenopathy in the chest.  Gas filled esophagus suggesting reflux or dysmotility.  No pleural effusion.  No pneumothorax.  Visualization of the lungs is limited due to respiratory motion artifact but there is no obvious consolidation or interstitial change.  Degenerative changes in the spine.  Focal nodular enlargement of the left thyroid gland, measuring about 1.3 cm diameter.  Visualized upper abdominal organs are unremarkable.  IMPRESSION: No evidence of significant pulmonary embolus.  Original Report Authenticated By: Marlon Pel, M.D.    Review of Systems  Constitutional: Negative.   HENT: Negative.   Eyes: Negative.   Respiratory: Positive for shortness of breath.   Cardiovascular: Positive for chest pain and palpitations.  Gastrointestinal: Negative.   Genitourinary: Negative.   Musculoskeletal: Negative.   Skin: Negative.   Neurological: Negative.   Endo/Heme/Allergies: Negative.   Psychiatric/Behavioral: Negative.     Blood pressure 114/56, pulse 87, temperature 97.9 F (36.6 C), temperature source Oral, resp. rate 17, SpO2 96.00%. Physical Exam  Constitutional: She is oriented to person, place, and time. She appears well-developed and well-nourished.  HENT:  Head: Normocephalic and atraumatic.  Right Ear: External ear normal.  Left Ear: External ear normal.  Mouth/Throat: Oropharynx is clear and moist.  Eyes: Conjunctivae and EOM are normal. Pupils are equal, round, and reactive to light.  Neck: Normal range of motion. Neck supple.  Cardiovascular: Normal rate, regular rhythm, normal heart sounds and intact distal pulses.   Respiratory: Effort normal and breath sounds normal.  GI: Soft. Bowel sounds are normal.  Musculoskeletal: Normal range of motion.  Neurological: She is  alert and oriented to person, place, and time. She has normal reflexes.  Skin: Skin is warm and dry.  Psychiatric: She has a normal mood and affect. Her behavior is normal. Thought content normal.     Assessment/Plan This is a 71 year old female presenting with right-sided chest pain which could be angina equivalent. She had a normal CT angiogram of the chest showed no PE. She has risk factors for coronary artery disease.  Plan #1 chest pain: Patient will be admitted for observation. We'll check serial cardiac enzymes. We'll give her nitroglycerin as glyceryl as needed. Panculture oral aspirin oxygen. Continue his antihypertensives. She may benefit from outpatient stress test if enzymes are negative. So far EKG is unchanged.  #2 hypertension: Continue his home medications.  #3 diabetes: We'll continue with the home treatment. Add sliding scale insulin.  #4 hyperlipidemia: Check fasting lipid panel and continue with statin.  #5 DVT prophylaxis: Patient is on Lovenox.  GARBA,LAWAL 05/27/2012, 12:05 AM

## 2012-05-27 NOTE — Progress Notes (Signed)
  Echocardiogram 2D Echocardiogram has been performed.  Georgian Co 05/27/2012, 6:06 PM

## 2012-05-27 NOTE — ED Provider Notes (Addendum)
Medical screening examination/treatment/procedure(s) were performed by non-physician practitioner and as supervising physician I was immediately available for consultation/collaboration.  Ethelda Chick, MD 05/27/12 1746   Ethelda Chick, MD 06/30/12 [phone]

## 2012-05-28 DIAGNOSIS — E109 Type 1 diabetes mellitus without complications: Secondary | ICD-10-CM | POA: Diagnosis not present

## 2012-05-28 DIAGNOSIS — R079 Chest pain, unspecified: Secondary | ICD-10-CM | POA: Diagnosis not present

## 2012-05-28 DIAGNOSIS — I10 Essential (primary) hypertension: Secondary | ICD-10-CM | POA: Diagnosis not present

## 2012-05-28 LAB — CBC
MCHC: 36.1 g/dL — ABNORMAL HIGH (ref 30.0–36.0)
Platelets: 168 10*3/uL (ref 150–400)
RDW: 14.9 % (ref 11.5–15.5)
WBC: 6.7 10*3/uL (ref 4.0–10.5)

## 2012-05-28 LAB — BASIC METABOLIC PANEL
Chloride: 98 mEq/L (ref 96–112)
GFR calc Af Amer: 49 mL/min — ABNORMAL LOW (ref >90)
GFR calc non Af Amer: 42 mL/min — ABNORMAL LOW (ref >90)
Potassium: 4.7 mEq/L (ref 3.5–5.1)

## 2012-05-28 LAB — GLUCOSE, CAPILLARY: Glucose-Capillary: 395 mg/dL — ABNORMAL HIGH (ref 70–99)

## 2012-05-28 LAB — T4, FREE: Free T4: 1.09 ng/dL (ref 0.80–1.80)

## 2012-05-28 NOTE — Discharge Summary (Signed)
Physician Discharge Summary  [name]:[id number] DOB: 1940-10-28 DOA: 05/26/2012  PCP: Katherine Roan  Admit date: 05/26/2012 Discharge date: 05/28/2012  Recommendations for Outpatient Follow-up:   Follow-up Information    Follow up with MCPHAIL, S. DEAN in 1 week.   Contact information:   Individual [address] [place] [address] [phone]       Schedule an appointment as soon as possible for a visit with [facility]   Contact information:   [address], [address] [place] [address] [phone]   [facility] will call patient with appointment       Discharge Diagnoses:  Chest Pain  DIABETES MELLITUS, TYPE I  HYPERLIPIDEMIA  HYPERTENSION   Discharge Condition: Stable Disposition: HomeHome  Diet recommendation: Diabetic Wt Readings from Last 3 Encounters:  05/27/12 91.717 kg (202 lb 3.2 oz)  10/06/11 93.895 kg (207 lb)  06/22/10 93.895 kg (207 lb)    History of present illness:  71 year old female with multiple medical problems including fibromyalgia depression diabetes hypertension and hyperlipidemia presenting with right-sided chest pain for one day. Pain was rated as 7/10 mainly on the right side of the chest radiating to have right shoulder. Associated with some mild diaphoresis. Denied nausea vomiting. No shortness of breath. Patient is worried about possibilities of how heart being affected. She denied any recent trauma. The pain is sharp. No relieving factor but aggravated by motion. Is not reproducible by pressure. She has risk factors for coronary artery disease including diabetes hyperlipidemia and hypertension hence is being admitted for further workup.   Hospital Course:  Chest pain  Patient admitted with chest pain rule out. CT Angio of the chest is negative. Cardiac markers negative. 2-D echo within normal limits  With normal EF. Discussed with cardiology Dr. Patty Sermons  inpatient versus outpatient workup with patient having multiple risk factors for heart disease. Her chest pain is atypical. Per discussion with Dr. Patty Sermons if her cardiac markers are negative then patient should followup with cardiology outpatient for stress test.   Patient was discharged home. Did call cardiology outpatient. They will call patient to schedule appointment for stress test.  DIABETES MELLITUS, TYPE I  She was continued on sliding scale insulin during this admission. Blood sugars remained stable  HYPERLIPIDEMIA  She was continued on her statin medications  HYPERTENSION  Blood pressure is stable    Discharge Instructions  Discharge Orders    Future Orders Please Complete By Expires   Diet Carb Modified        Medication List  As of 05/28/2012  4:27 PM   TAKE these medications         ALEVE 220 MG tablet   Generic drug: naproxen sodium   Take 440 mg by mouth 2 (two) times daily with a meal.      aspirin 81 MG tablet   Take 81 mg by mouth daily.      atorvastatin 20 MG tablet   Commonly known as: LIPITOR   Take 10 mg by mouth at bedtime.      CALCIUM 600+D3 600-200 MG-UNIT Tabs   Generic drug: Calcium Carb-Cholecalciferol   Take 1 tablet by mouth 2 (two) times daily.      Cholestyramine Powd   Take 4 g by mouth every other day.      desonide 0.05 % ointment   Commonly known as: DESOWEN   Apply 1 application topically 2 (two) times daily.  diphenhydrAMINE 25 MG tablet   Commonly known as: SOMINEX   Take 25 mg by mouth 3 (three) times daily.      DULoxetine 60 MG capsule   Commonly known as: CYMBALTA   Take 60 mg by mouth daily.      ENABLEX 15 MG 24 hr tablet   Generic drug: darifenacin   Take 15 mg by mouth daily.      escitalopram 20 MG tablet   Commonly known as: LEXAPRO   Take 40 mg by mouth daily.      esomeprazole 40 MG capsule   Commonly known as: NEXIUM   Take 40 mg by mouth daily.      estradiol 0.1 MG/GM vaginal cream    Commonly known as: ESTRACE   Place 2 g vaginally every Monday, Wednesday, and Friday.      fish oil-omega-3 fatty acids 1000 MG capsule   Take 1 capsule by mouth 2 (two) times daily.      glucose blood test strip   1 each by Other route as needed. Use as instructed      insulin aspart 100 UNIT/ML injection   Commonly known as: novoLOG   Inject 2-6 Units into the skin 3 (three) times daily as needed. Sliding scale      insulin aspart protamine-insulin aspart (70-30) 100 UNIT/ML injection   Commonly known as: NOVOLOG 70/30   Inject 45-50 Units into the skin 2 (two) times daily with a meal. 50 units in the morning and 45 units in the evening      INSULIN SYRINGE .3CC/31GX5/16" 31G X 5/16" 0.3 ML Misc   by Does not apply route as directed.      INSULIN SYRINGE 1CC/31GX5/16" 31G X 5/16" 1 ML Misc   by Does not apply route as directed.      Magnesium 250 MG Tabs   Take 1 tablet by mouth at bedtime.      metoprolol succinate 25 MG 24 hr tablet   Commonly known as: TOPROL-XL   Take 12.5 mg by mouth at bedtime.      multivitamin tablet   Take 0.5 tablets by mouth 2 (two) times daily.      nitrofurantoin 100 MG capsule   Commonly known as: MACRODANTIN   Take 100 mg by mouth daily.      traZODone 150 MG tablet   Commonly known as: DESYREL   Take 150 mg by mouth at bedtime.      triamterene-hydrochlorothiazide 37.5-25 MG per capsule   Commonly known as: DYAZIDE   Take 1 capsule by mouth every morning.      vitamin B-12 100 MCG tablet   Commonly known as: CYANOCOBALAMIN   Take 50 mcg by mouth daily.           Follow-up Information    Follow up with MCPHAIL, S. DEAN in 1 week.   Contact information:   Individual [address] [place] [address] [phone]       Schedule an appointment as soon as possible for a visit with [facility]   Contact information:   [address] [place]  [address] [phone]          The results of significant diagnostics from this hospitalization (including imaging, microbiology, ancillary and laboratory) are listed below for reference.    Significant Diagnostic Studies: Dg Chest 2 View  05/26/2012  *RADIOLOGY REPORT*  Clinical Data: Chest pain.  CHEST -  2 VIEW  Comparison: 07/10/2010  Findings: The cardiomediastinal silhouette is unremarkable. The lungs are clear. There is no evidence of focal airspace disease, pulmonary edema, suspicious pulmonary nodule/mass, pleural effusion, or pneumothorax. No acute bony abnormalities are identified.  IMPRESSION: No evidence of active cardiopulmonary disease.  Original Report Authenticated By: Rosendo Gros, M.D.   Ct Angio Chest Pe W/cm &/or Wo Cm  05/26/2012  *RADIOLOGY REPORT*  Clinical Data: Right side and right shoulder pain with inspiration.  CT ANGIOGRAPHY CHEST  Technique:  Multidetector CT imaging of the chest using the standard protocol during bolus administration of intravenous contrast. Multiplanar reconstructed images including MIPs were obtained and reviewed to evaluate the vascular anatomy.  Contrast: OMNIPAQUE IOHEXOL 350 MG/ML SOLN  Comparison: None.  Findings: Technically adequate study with good opacification of the central and segmental pulmonary arteries.  No focal filling defects demonstrated.  No evidence of significant pulmonary embolus.  Normal heart size.  Calcification in the region of the mitral valve annulus.  Normal caliber thoracic aorta.  Mild aortic calcification.  No significant lymphadenopathy in the chest.  Gas filled esophagus suggesting reflux or dysmotility.  No pleural effusion.  No pneumothorax.  Visualization of the lungs is limited due to respiratory motion artifact but there is no obvious consolidation or interstitial change.  Degenerative changes in the spine.  Focal nodular enlargement of the left thyroid gland, measuring about 1.3 cm diameter.  Visualized upper  abdominal organs are unremarkable.  IMPRESSION: No evidence of significant pulmonary embolus.  Original Report Authenticated By: Marlon Pel, M.D.     Labs: Basic Metabolic Panel:  Lab 05/28/12 4098 05/27/12 0645 05/26/12 1704  NA 133* 134* 135  K 4.7 4.1 3.9  CL 98 98 98  CO2 23 28 31   GLUCOSE 89 248* 234*  BUN 23 20 26*  CREATININE 1.25* 1.19* 1.22*  CALCIUM 9.4 8.7 9.1  MG -- -- --  PHOS -- -- --   Liver Function Tests:  Lab 05/27/12 0645 05/26/12 1704  AST 18 21  ALT 14 15  ALKPHOS 57 57  BILITOT 0.2* 0.2*  PROT 5.7* 6.3  ALBUMIN 2.9* 3.3*    CBC:  Lab 05/28/12 0515 05/27/12 0645 05/26/12 1704  WBC 6.7 5.4 6.9  NEUTROABS -- -- --  HGB 11.2* 11.2* 11.8*  HCT 31.0* 33.7* 35.0*  MCV 86.1 88.7 87.5  PLT 168 140* 152   Cardiac Enzymes:  Lab 05/27/12 1747 05/27/12 0853 05/27/12 0137  CKTOTAL 69 61 65  CKMB 2.5 2.6 2.8  CKMBINDEX -- -- --  TROPONINI <0.30 <0.30 <0.30   BNP: BNP (last 3 results)  Basename 05/27/12 0137  PROBNP 77.0   CBG:  Lab 05/28/12 1059 05/28/12 0612 05/27/12 2128 05/27/12 1546 05/27/12 0615  GLUCAP 395* 98 98 220* 212*       Time coordinating discharge: 60 min Signed:  Molli Posey, MD [facility] 05/28/2012, 4:27 PM

## 2012-06-05 ENCOUNTER — Encounter (HOSPITAL_COMMUNITY): Payer: Medicare Other

## 2012-06-08 DIAGNOSIS — B351 Tinea unguium: Secondary | ICD-10-CM | POA: Diagnosis not present

## 2012-06-08 DIAGNOSIS — E119 Type 2 diabetes mellitus without complications: Secondary | ICD-10-CM | POA: Diagnosis not present

## 2012-06-08 DIAGNOSIS — L851 Acquired keratosis [keratoderma] palmaris et plantaris: Secondary | ICD-10-CM | POA: Diagnosis not present

## 2012-06-09 DIAGNOSIS — K859 Acute pancreatitis without necrosis or infection, unspecified: Secondary | ICD-10-CM | POA: Diagnosis not present

## 2012-06-09 DIAGNOSIS — E785 Hyperlipidemia, unspecified: Secondary | ICD-10-CM | POA: Diagnosis not present

## 2012-06-09 DIAGNOSIS — E119 Type 2 diabetes mellitus without complications: Secondary | ICD-10-CM | POA: Diagnosis not present

## 2012-06-12 ENCOUNTER — Ambulatory Visit (HOSPITAL_COMMUNITY): Payer: Medicare Other | Attending: Cardiology | Admitting: Radiology

## 2012-06-12 VITALS — BP 126/63 | Ht 63.0 in | Wt 197.0 lb

## 2012-06-12 DIAGNOSIS — R079 Chest pain, unspecified: Secondary | ICD-10-CM | POA: Diagnosis not present

## 2012-06-12 DIAGNOSIS — I10 Essential (primary) hypertension: Secondary | ICD-10-CM | POA: Insufficient documentation

## 2012-06-12 DIAGNOSIS — R61 Generalized hyperhidrosis: Secondary | ICD-10-CM | POA: Insufficient documentation

## 2012-06-12 DIAGNOSIS — E119 Type 2 diabetes mellitus without complications: Secondary | ICD-10-CM | POA: Insufficient documentation

## 2012-06-12 MED ORDER — TECHNETIUM TC 99M TETROFOSMIN IV KIT
33.0000 | PACK | Freq: Once | INTRAVENOUS | Status: AC | PRN
  Administered 2012-06-12: 33 via INTRAVENOUS

## 2012-06-12 MED ORDER — TECHNETIUM TC 99M TETROFOSMIN IV KIT
11.0000 | PACK | Freq: Once | INTRAVENOUS | Status: AC | PRN
  Administered 2012-06-12: 11 via INTRAVENOUS

## 2012-06-12 MED ORDER — REGADENOSON 0.4 MG/5ML IV SOLN
0.4000 mg | Freq: Once | INTRAVENOUS | Status: AC
  Administered 2012-06-12: 0.4 mg via INTRAVENOUS

## 2012-06-12 NOTE — Progress Notes (Signed)
[facility] SITE 3 NUCLEAR MED [address] [place] [address] [phone]  Cardiology Nuclear Med Study  [name] is a 71 y.o. female     MRN : [id number]     DOB: 1941-01-28  Procedure Date: 06/12/2012  Nuclear Med Background Indication for Stress Test:  Evaluation for Ischemia and Post Hospital:ER with Chest Pain and (-) enzymes History:  '07: MPS: Anterior ischemia -to cath-EF: 70-75% NL ECHO: EF: 60-65% mild LVH H/O MVP Cardiac Risk Factors: Hypertension, IDDM Type 2 and Lipids  Symptoms:  Chest Pain and Diaphoresis   Nuclear Pre-Procedure Caffeine/Decaff Intake:  None NPO After: 8:00am   Lungs:  clear O2 Sat: 98% on room air. IV 0.9% NS with Angio Cath:  22g  IV Site: R Forearm  IV Started by:  [name] Parsons, RN  Chest Size (in):  44 Cup Size: C  Height: 5\' 3"  (1.6 m)  Weight:  197 lb (89.359 kg)  BMI:  Body mass index is 34.90 kg/(m^2). Tech Comments:  Toprol held x 16 hrs This patient was able to walk the treadmill for a very short period of time and then had to be switched to a walking Lexiscan. She's probably not a good candidate for walking the treadmill again.    Nuclear Med Study 1 or 2 day study: 1 day  Stress Test Type:  Treadmill/Lexiscan  Reading MD: [name] Millers, MD  Order Authorizing Provider:  Melany [name]  Resting Radionuclide: Technetium 74m Tetrofosmin  Resting Radionuclide Dose: 11.0 mCi   Stress Radionuclide:  Technetium 49m Tetrofosmin  Stress Radionuclide Dose: 33.0 mCi           Stress Protocol Rest HR: 75 Stress HR: 118  Rest BP: 126/63 Stress BP: 130/50  Exercise Time (min): 4:04 METS: 4.60   Predicted Max HR: 149 bpm % Max HR: 79.19 bpm Rate Pressure Product: 57846   Dose of Adenosine (mg):  n/a Dose of Lexiscan: 0.4 mg  Dose of Atropine (mg): n/a Dose of Dobutamine: n/a mcg/kg/min (at max HR)  Stress Test Technologist: [name], EMT-P  Nuclear Technologist:  [name], CNMT      Rest Procedure:  Myocardial perfusion imaging was performed at rest 45 minutes following the intravenous administration of Technetium 58m Tetrofosmin. Rest ECG: NSR - Normal EKG  Stress Procedure:  The patient received IV Lexiscan 0.4 mg over 15-seconds with concurrent low level exercise and then Technetium 71m Tetrofosmin was injected at 30-seconds while the patient continued walking one more minute. There were no significant changes, + sob, and nausea with Lexiscan. Quantitative spect images were obtained after a 45-minute delay. Stress ECG: No significant change from baseline ECG  QPS Raw Data Images:  Acquisition technically good; normal left ventricular size. Stress Images:  Normal homogeneous uptake in all areas of the myocardium. Rest Images:  Normal homogeneous uptake in all areas of the myocardium. Subtraction (SDS):  No evidence of ischemia. Transient Ischemic Dilatation (Normal <1.22):  0.94 Lung/Heart Ratio (Normal <0.45):  0.28  Quantitative Gated Spect Images QGS EDV:  45 ml QGS ESV:  9 ml  Impression Exercise Capacity:  Lexiscan with low level exercise. BP Response:  Normal blood pressure response. Clinical Symptoms:  There is dyspnea. ECG Impression:  No significant ST segment change suggestive of ischemia. Comparison with Prior Nuclear Study: No images to compare  Overall Impression:  Normal stress nuclear study.  LV Ejection Fraction: 80%.  LV Wall Motion:  NL LV Function; NL Wall Motion  [name]

## 2012-06-15 DIAGNOSIS — R350 Frequency of micturition: Secondary | ICD-10-CM | POA: Diagnosis not present

## 2012-06-15 DIAGNOSIS — N3941 Urge incontinence: Secondary | ICD-10-CM | POA: Diagnosis not present

## 2012-06-30 NOTE — ED Provider Notes (Signed)
Medical screening examination/treatment/procedure(s) were performed by non-physician practitioner and as supervising physician I was immediately available for consultation/collaboration.  Ethelda Chick, MD 06/30/12 [phone]

## 2012-07-21 DIAGNOSIS — E119 Type 2 diabetes mellitus without complications: Secondary | ICD-10-CM | POA: Diagnosis not present

## 2012-07-21 DIAGNOSIS — K219 Gastro-esophageal reflux disease without esophagitis: Secondary | ICD-10-CM | POA: Diagnosis not present

## 2012-08-08 ENCOUNTER — Ambulatory Visit: Payer: Medicare Other | Attending: Family Medicine | Admitting: Physical Therapy

## 2012-08-08 DIAGNOSIS — R5381 Other malaise: Secondary | ICD-10-CM | POA: Diagnosis not present

## 2012-08-08 DIAGNOSIS — M545 Low back pain, unspecified: Secondary | ICD-10-CM | POA: Insufficient documentation

## 2012-08-08 DIAGNOSIS — IMO0001 Reserved for inherently not codable concepts without codable children: Secondary | ICD-10-CM | POA: Insufficient documentation

## 2012-08-14 DIAGNOSIS — E11311 Type 2 diabetes mellitus with unspecified diabetic retinopathy with macular edema: Secondary | ICD-10-CM | POA: Diagnosis not present

## 2012-08-14 DIAGNOSIS — H43819 Vitreous degeneration, unspecified eye: Secondary | ICD-10-CM | POA: Diagnosis not present

## 2012-08-14 DIAGNOSIS — H26499 Other secondary cataract, unspecified eye: Secondary | ICD-10-CM | POA: Diagnosis not present

## 2012-08-15 ENCOUNTER — Ambulatory Visit: Payer: Medicare Other | Admitting: Physical Therapy

## 2012-08-15 DIAGNOSIS — M545 Low back pain: Secondary | ICD-10-CM | POA: Diagnosis not present

## 2012-08-15 DIAGNOSIS — R5381 Other malaise: Secondary | ICD-10-CM | POA: Diagnosis not present

## 2012-08-15 DIAGNOSIS — IMO0001 Reserved for inherently not codable concepts without codable children: Secondary | ICD-10-CM | POA: Diagnosis not present

## 2012-08-17 DIAGNOSIS — B351 Tinea unguium: Secondary | ICD-10-CM | POA: Diagnosis not present

## 2012-08-17 DIAGNOSIS — E119 Type 2 diabetes mellitus without complications: Secondary | ICD-10-CM | POA: Diagnosis not present

## 2012-08-17 DIAGNOSIS — L851 Acquired keratosis [keratoderma] palmaris et plantaris: Secondary | ICD-10-CM | POA: Diagnosis not present

## 2012-08-22 ENCOUNTER — Ambulatory Visit: Payer: Medicare Other | Admitting: Physical Therapy

## 2012-08-22 DIAGNOSIS — IMO0001 Reserved for inherently not codable concepts without codable children: Secondary | ICD-10-CM | POA: Diagnosis not present

## 2012-08-22 DIAGNOSIS — R5381 Other malaise: Secondary | ICD-10-CM | POA: Diagnosis not present

## 2012-08-22 DIAGNOSIS — M545 Low back pain: Secondary | ICD-10-CM | POA: Diagnosis not present

## 2012-08-24 ENCOUNTER — Ambulatory Visit: Payer: Medicare Other | Admitting: Physical Therapy

## 2012-08-24 DIAGNOSIS — M545 Low back pain: Secondary | ICD-10-CM | POA: Diagnosis not present

## 2012-08-24 DIAGNOSIS — IMO0001 Reserved for inherently not codable concepts without codable children: Secondary | ICD-10-CM | POA: Diagnosis not present

## 2012-08-24 DIAGNOSIS — R5381 Other malaise: Secondary | ICD-10-CM | POA: Diagnosis not present

## 2012-08-29 ENCOUNTER — Encounter: Payer: Medicare Other | Admitting: Physical Therapy

## 2012-08-31 ENCOUNTER — Ambulatory Visit: Payer: Medicare Other | Attending: Family Medicine | Admitting: Physical Therapy

## 2012-08-31 DIAGNOSIS — R5381 Other malaise: Secondary | ICD-10-CM | POA: Insufficient documentation

## 2012-08-31 DIAGNOSIS — M545 Low back pain, unspecified: Secondary | ICD-10-CM | POA: Insufficient documentation

## 2012-08-31 DIAGNOSIS — IMO0001 Reserved for inherently not codable concepts without codable children: Secondary | ICD-10-CM | POA: Insufficient documentation

## 2012-09-08 DIAGNOSIS — R109 Unspecified abdominal pain: Secondary | ICD-10-CM | POA: Diagnosis not present

## 2012-09-08 DIAGNOSIS — R112 Nausea with vomiting, unspecified: Secondary | ICD-10-CM | POA: Diagnosis not present

## 2012-09-08 DIAGNOSIS — R197 Diarrhea, unspecified: Secondary | ICD-10-CM | POA: Diagnosis not present

## 2012-09-12 ENCOUNTER — Encounter: Payer: Medicare Other | Admitting: Physical Therapy

## 2012-11-02 DIAGNOSIS — N3946 Mixed incontinence: Secondary | ICD-10-CM | POA: Diagnosis not present

## 2012-11-02 DIAGNOSIS — N3020 Other chronic cystitis without hematuria: Secondary | ICD-10-CM | POA: Diagnosis not present

## 2012-11-09 DIAGNOSIS — B351 Tinea unguium: Secondary | ICD-10-CM | POA: Diagnosis not present

## 2012-11-09 DIAGNOSIS — Z79899 Other long term (current) drug therapy: Secondary | ICD-10-CM | POA: Diagnosis not present

## 2012-11-09 DIAGNOSIS — E785 Hyperlipidemia, unspecified: Secondary | ICD-10-CM | POA: Diagnosis not present

## 2012-11-09 DIAGNOSIS — L851 Acquired keratosis [keratoderma] palmaris et plantaris: Secondary | ICD-10-CM | POA: Diagnosis not present

## 2012-11-09 DIAGNOSIS — I10 Essential (primary) hypertension: Secondary | ICD-10-CM | POA: Diagnosis not present

## 2012-11-09 DIAGNOSIS — R5381 Other malaise: Secondary | ICD-10-CM | POA: Diagnosis not present

## 2012-11-09 DIAGNOSIS — R5383 Other fatigue: Secondary | ICD-10-CM | POA: Diagnosis not present

## 2012-11-09 DIAGNOSIS — E119 Type 2 diabetes mellitus without complications: Secondary | ICD-10-CM | POA: Diagnosis not present

## 2012-11-13 DIAGNOSIS — R197 Diarrhea, unspecified: Secondary | ICD-10-CM | POA: Diagnosis not present

## 2012-11-24 DIAGNOSIS — R109 Unspecified abdominal pain: Secondary | ICD-10-CM | POA: Diagnosis not present

## 2012-11-24 DIAGNOSIS — R197 Diarrhea, unspecified: Secondary | ICD-10-CM | POA: Diagnosis not present

## 2012-12-11 DIAGNOSIS — H26499 Other secondary cataract, unspecified eye: Secondary | ICD-10-CM | POA: Diagnosis not present

## 2012-12-11 DIAGNOSIS — H3581 Retinal edema: Secondary | ICD-10-CM | POA: Diagnosis not present

## 2012-12-11 DIAGNOSIS — H43819 Vitreous degeneration, unspecified eye: Secondary | ICD-10-CM | POA: Diagnosis not present

## 2012-12-11 DIAGNOSIS — E11311 Type 2 diabetes mellitus with unspecified diabetic retinopathy with macular edema: Secondary | ICD-10-CM | POA: Diagnosis not present

## 2012-12-13 ENCOUNTER — Encounter: Payer: Self-pay | Admitting: Cardiology

## 2012-12-13 ENCOUNTER — Ambulatory Visit (INDEPENDENT_AMBULATORY_CARE_PROVIDER_SITE_OTHER): Payer: Medicare Other | Admitting: Cardiology

## 2012-12-13 VITALS — BP 120/60 | HR 72 | Ht 63.0 in | Wt 194.0 lb

## 2012-12-13 DIAGNOSIS — I10 Essential (primary) hypertension: Secondary | ICD-10-CM | POA: Diagnosis not present

## 2012-12-13 DIAGNOSIS — R079 Chest pain, unspecified: Secondary | ICD-10-CM

## 2012-12-13 DIAGNOSIS — I059 Rheumatic mitral valve disease, unspecified: Secondary | ICD-10-CM

## 2012-12-13 NOTE — Progress Notes (Signed)
HPI The patient presents for follow up of chest pain with normal coronaries on cath in 2007.  She had a history of MVP but no evidence of this on her most recent echocardiograms.  She was hospitalized last year with chest pain. This was atypical and there were no EKG findings had normal enzymes. Outpatient stress testing was negative for any evidence of ischemia. Since I last saw her she has done well from a cardiovascular standpoint. The patient denies any new symptoms such as chest discomfort, neck or arm discomfort. There has been no new shortness of breath, PND or orthopnea. There have been no reported palpitations, presyncope or syncope. She has had lots of trouble with her bowels.   Allergies  Allergen Reactions  . Amoxicillin   . Hydrocodone   . Hydrocodone-Acetaminophen Other (See Comments)    hallucinations  . Hyoscyamine Sulfate   . Morphine     REACTION: hallucinations,GI upset  . Tramadol   . Zolpidem Tartrate     REACTION: hallucinations  . Latex Rash  . Prednisone Rash and Other (See Comments)    Hyperglycemia   . Sulfa Antibiotics Rash    Current Outpatient Prescriptions  Medication Sig Dispense Refill  . Acetaminophen (TYLENOL PO) Take by mouth as needed.      Marland Kitchen aspirin 81 MG tablet Take 81 mg by mouth daily.        Marland Kitchen atorvastatin (LIPITOR) 20 MG tablet Take 10 mg by mouth at bedtime.       . Calcium Carb-Cholecalciferol (CALCIUM 600+D3) 600-200 MG-UNIT TABS Take 1 tablet by mouth 2 (two) times daily.        Marland Kitchen darifenacin (ENABLEX) 15 MG 24 hr tablet Take 15 mg by mouth daily.      Marland Kitchen desonide (DESOWEN) 0.05 % ointment Apply 1 application topically 2 (two) times daily.       . diphenoxylate-atropine (LOMOTIL) 2.5-0.025 MG per tablet Take 1 tablet by mouth 4 (four) times daily as needed for diarrhea or loose stools.      . DULoxetine (CYMBALTA) 60 MG capsule Take 60 mg by mouth daily.        Marland Kitchen esomeprazole (NEXIUM) 40 MG capsule Take 40 mg by mouth daily.       Marland Kitchen  estradiol (ESTRACE) 0.1 MG/GM vaginal cream Place 2 g vaginally every Monday, Wednesday, and Friday.       . fish oil-omega-3 fatty acids 1000 MG capsule Take 1 capsule by mouth 2 (two) times daily.       Marland Kitchen glucose blood test strip 1 each by Other route as needed. Use as instructed       . insulin aspart (NOVOLOG) 100 UNIT/ML injection Inject 2-6 Units into the skin 3 (three) times daily as needed. Sliding scale      . insulin aspart protamine-insulin aspart (NOVOLOG 70/30) (70-30) 100 UNIT/ML injection Inject 45-50 Units into the skin 2 (two) times daily with a meal. 50 units in the morning and 45 units in the evening      . Insulin Syringe-Needle U-100 (INSULIN SYRINGE .3CC/31GX5/16") 31G X 5/16" 0.3 ML MISC by Does not apply route as directed.        . Insulin Syringe-Needle U-100 (INSULIN SYRINGE 1CC/31GX5/16") 31G X 5/16" 1 ML MISC by Does not apply route as directed.        . Magnesium 250 MG TABS Take 1 tablet by mouth at bedtime.      . metoprolol succinate (TOPROL-XL) 25 MG 24  hr tablet Take 12.5 mg by mouth at bedtime.       . Multiple Vitamin (MULTIVITAMIN) tablet Take 0.5 tablets by mouth 2 (two) times daily.       . nitrofurantoin (MACRODANTIN) 100 MG capsule Take 100 mg by mouth daily.        . Probiotic Product (PROBIOTIC DAILY PO) Take by mouth.      . traZODone (DESYREL) 150 MG tablet Take 150 mg by mouth at bedtime.        . triamterene-hydrochlorothiazide (DYAZIDE) 37.5-25 MG per capsule Take 1 capsule by mouth every morning.        . vitamin B-12 (CYANOCOBALAMIN) 100 MCG tablet Take 50 mcg by mouth daily.         No current facility-administered medications for this visit.    Past Medical History  Diagnosis Date  . Fibromyalgia   . IBS (irritable bowel syndrome)   . Diabetic retinopathy(362.0)   . Osteoporosis   . Acute cystitis   . Depression   . Diabetes mellitus type II   . Hyperlipidemia   . Hypertension     Past Surgical History  Procedure Laterality Date  .  Cataract extraction w/ intraocular lens implant  09/2003, 12/2003  . Cholecystectomy  2004  . Tubal ligation  1970  . Ankle surgery  02/2004  . Total knee arthroplasty      Left   ROS:  As stated in the HPI and negative for all other systems.  PHYSICAL EXAM BP 120/60  Pulse 72  Ht 5\' 3"  (1.6 m)  Wt 194 lb (87.998 kg)  BMI 34.37 kg/m2 GENERAL:  Well appearing NECK:  No jugular venous distention, waveform within normal limits, carotid upstroke brisk and symmetric, no bruits, no thyromegaly LUNGS:  Clear to auscultation bilaterally CHEST:  Unremarkable HEART:  PMI not displaced or sustained,S1 and S2 within normal limits, no S3, no S4, no clicks, no rubs, no murmurs ABD:  Flat, positive bowel sounds normal in frequency in pitch, no bruits, no rebound, no guarding, no midline pulsatile mass, no hepatomegaly, no splenomegaly EXT:  2 plus pulses throughout, no edema, no cyanosis no clubbing  EKG:  Sinus rhythm, rate 72, axis within normal limits, intervals within normal limits, no acute ST-T wave changes.  12/13/2012   ASSESSMENT AND PLAN  Chest pain She's had no further chest pain. No further workup is suggested.  HTN Her blood pressure has been well controlled.She will remain on a low dose of beta blocker.  Diabetes Her hemoglobin A1c is 7.6 but this is being managed by her primary MD  Dyslipidemia Her last HDL was 55 with an LDL of 52 series no change in therapy is indicated.

## 2012-12-13 NOTE — Patient Instructions (Addendum)
The current medical regimen is effective;  continue present plan and medications.  Follow up as needed 

## 2012-12-18 DIAGNOSIS — I10 Essential (primary) hypertension: Secondary | ICD-10-CM | POA: Diagnosis not present

## 2012-12-18 DIAGNOSIS — R42 Dizziness and giddiness: Secondary | ICD-10-CM | POA: Diagnosis not present

## 2012-12-19 DIAGNOSIS — R197 Diarrhea, unspecified: Secondary | ICD-10-CM | POA: Diagnosis not present

## 2012-12-19 DIAGNOSIS — R109 Unspecified abdominal pain: Secondary | ICD-10-CM | POA: Diagnosis not present

## 2012-12-24 ENCOUNTER — Encounter: Payer: Self-pay | Admitting: Family Medicine

## 2013-01-01 DIAGNOSIS — I10 Essential (primary) hypertension: Secondary | ICD-10-CM | POA: Diagnosis not present

## 2013-01-01 DIAGNOSIS — E119 Type 2 diabetes mellitus without complications: Secondary | ICD-10-CM | POA: Diagnosis not present

## 2013-01-18 DIAGNOSIS — E1149 Type 2 diabetes mellitus with other diabetic neurological complication: Secondary | ICD-10-CM | POA: Diagnosis not present

## 2013-01-18 DIAGNOSIS — L851 Acquired keratosis [keratoderma] palmaris et plantaris: Secondary | ICD-10-CM | POA: Diagnosis not present

## 2013-01-18 DIAGNOSIS — B351 Tinea unguium: Secondary | ICD-10-CM | POA: Diagnosis not present

## 2013-02-06 DIAGNOSIS — E11311 Type 2 diabetes mellitus with unspecified diabetic retinopathy with macular edema: Secondary | ICD-10-CM | POA: Diagnosis not present

## 2013-02-06 DIAGNOSIS — H3581 Retinal edema: Secondary | ICD-10-CM | POA: Diagnosis not present

## 2013-02-06 DIAGNOSIS — H26499 Other secondary cataract, unspecified eye: Secondary | ICD-10-CM | POA: Diagnosis not present

## 2013-02-06 DIAGNOSIS — H43819 Vitreous degeneration, unspecified eye: Secondary | ICD-10-CM | POA: Diagnosis not present

## 2013-02-19 ENCOUNTER — Ambulatory Visit: Payer: Self-pay | Admitting: Family Medicine

## 2013-03-05 DIAGNOSIS — Z1231 Encounter for screening mammogram for malignant neoplasm of breast: Secondary | ICD-10-CM | POA: Diagnosis not present

## 2013-03-05 DIAGNOSIS — R197 Diarrhea, unspecified: Secondary | ICD-10-CM | POA: Diagnosis not present

## 2013-03-05 DIAGNOSIS — R109 Unspecified abdominal pain: Secondary | ICD-10-CM | POA: Diagnosis not present

## 2013-03-23 ENCOUNTER — Ambulatory Visit (INDEPENDENT_AMBULATORY_CARE_PROVIDER_SITE_OTHER): Payer: Medicare Other | Admitting: Family Medicine

## 2013-03-23 ENCOUNTER — Encounter: Payer: Self-pay | Admitting: Family Medicine

## 2013-03-23 VITALS — BP 143/68 | HR 70 | Temp 97.9°F | Wt 199.4 lb

## 2013-03-23 DIAGNOSIS — R5381 Other malaise: Secondary | ICD-10-CM | POA: Diagnosis not present

## 2013-03-23 DIAGNOSIS — E785 Hyperlipidemia, unspecified: Secondary | ICD-10-CM | POA: Insufficient documentation

## 2013-03-23 DIAGNOSIS — I10 Essential (primary) hypertension: Secondary | ICD-10-CM | POA: Diagnosis not present

## 2013-03-23 DIAGNOSIS — G4700 Insomnia, unspecified: Secondary | ICD-10-CM | POA: Diagnosis not present

## 2013-03-23 DIAGNOSIS — K219 Gastro-esophageal reflux disease without esophagitis: Secondary | ICD-10-CM | POA: Diagnosis not present

## 2013-03-23 DIAGNOSIS — E119 Type 2 diabetes mellitus without complications: Secondary | ICD-10-CM | POA: Insufficient documentation

## 2013-03-23 DIAGNOSIS — F3289 Other specified depressive episodes: Secondary | ICD-10-CM

## 2013-03-23 DIAGNOSIS — R5383 Other fatigue: Secondary | ICD-10-CM | POA: Diagnosis not present

## 2013-03-23 DIAGNOSIS — F32A Depression, unspecified: Secondary | ICD-10-CM | POA: Insufficient documentation

## 2013-03-23 DIAGNOSIS — E531 Pyridoxine deficiency: Secondary | ICD-10-CM | POA: Diagnosis not present

## 2013-03-23 DIAGNOSIS — F329 Major depressive disorder, single episode, unspecified: Secondary | ICD-10-CM

## 2013-03-23 LAB — POCT CBC
Granulocyte percent: 60.5 %G (ref 37–80)
HCT, POC: 42.7 % (ref 37.7–47.9)
Hemoglobin: 14.5 g/dL (ref 12.2–16.2)
Lymph, poc: 2.4 (ref 0.6–3.4)
MCH, POC: 31.2 pg (ref 27–31.2)
MCHC: 34 g/dL (ref 31.8–35.4)
MCV: 91.8 fL (ref 80–97)
MPV: 7.6 fL (ref 0–99.8)
POC Granulocyte: 4.2 (ref 2–6.9)
POC LYMPH PERCENT: 34.1 %L (ref 10–50)
Platelet Count, POC: 164 10*3/uL (ref 142–424)
RBC: 4.7 M/uL (ref 4.04–5.48)
RDW, POC: 13.2 %
WBC: 7 10*3/uL (ref 4.6–10.2)

## 2013-03-23 LAB — POCT GLYCOSYLATED HEMOGLOBIN (HGB A1C): Hemoglobin A1C: 6.1

## 2013-03-23 LAB — COMPLETE METABOLIC PANEL WITH GFR
ALT: 18 U/L (ref 0–35)
AST: 22 U/L (ref 0–37)
Albumin: 4.2 g/dL (ref 3.5–5.2)
Alkaline Phosphatase: 63 U/L (ref 39–117)
BUN: 21 mg/dL (ref 6–23)
CO2: 34 mEq/L — ABNORMAL HIGH (ref 19–32)
Calcium: 9.6 mg/dL (ref 8.4–10.5)
Chloride: 96 mEq/L (ref 96–112)
Creat: 1.18 mg/dL — ABNORMAL HIGH (ref 0.50–1.10)
GFR, Est African American: 53 mL/min — ABNORMAL LOW
GFR, Est Non African American: 46 mL/min — ABNORMAL LOW
Glucose, Bld: 90 mg/dL (ref 70–99)
Potassium: 4.2 mEq/L (ref 3.5–5.3)
Sodium: 138 mEq/L (ref 135–145)
Total Bilirubin: 0.5 mg/dL (ref 0.3–1.2)
Total Protein: 6.6 g/dL (ref 6.0–8.3)

## 2013-03-23 LAB — FOLATE: Folate: >20 ng/mL

## 2013-03-23 LAB — VITAMIN B12: Vitamin B-12: 1131 pg/mL — ABNORMAL HIGH (ref 211–911)

## 2013-03-23 LAB — TSH: TSH: 3.17 u[IU]/mL (ref 0.350–4.500)

## 2013-03-23 MED ORDER — INSULIN ASPART PROT & ASPART (70-30 MIX) 100 UNIT/ML ~~LOC~~ SUSP: 45.0000 [IU] | Freq: Two times a day (BID) | SUBCUTANEOUS | Status: DC

## 2013-03-23 MED ORDER — ESOMEPRAZOLE MAGNESIUM 40 MG PO CPDR: 40.0000 mg | DELAYED_RELEASE_CAPSULE | Freq: Every day | ORAL | Status: DC

## 2013-03-23 MED ORDER — "INSULIN SYRINGE 31G X 5/16"" 1 ML MISC": 1.0000 | Status: DC

## 2013-03-23 MED ORDER — TRAZODONE HCL 150 MG PO TABS: 150.0000 mg | ORAL_TABLET | Freq: Every day | ORAL | Status: DC

## 2013-03-23 MED ORDER — METOPROLOL SUCCINATE ER 25 MG PO TB24: 12.5000 mg | ORAL_TABLET | Freq: Every day | ORAL | Status: DC

## 2013-03-23 MED ORDER — "INSULIN SYRINGE 31G X 5/16"" 0.3 ML MISC": 1.0000 | Status: DC

## 2013-03-23 NOTE — Progress Notes (Signed)
Patient ID: [name], female   DOB: [birth year], 72 y.o.   MRN: [id number] SUBJECTIVE: HPI: Patient is here for follow up of Diabetes Mellitus/hyperlipidemia/hypertension: Symptoms of DM: Denies Nocturia ,Denies Urinary Frequency , denies Blurred vision ,deniesDizziness,denies.Dysuria,denies paresthesias, denies extremity pain or ulcers.Marland Kitchendenies chest pain. has had an annual eye exam. do check the feet. Does check CBGs. Average NWG:[id number]  Denies episodes of hypoglycemia. Does have an emergency hypoglycemic plan. admits toCompliance with medications.  Problems with medications.: Patient thinks the medications are making her drowsy.would like to reduce and come off some.Saw a TV program with Dr Neil Crouch and had started on Magnesium.   PMH/PSH: reviewed/updated in Epic  SH/FH: reviewed/updated in Epic  Allergies: reviewed/updated in Epic  Medications: reviewed/updated in Epic  Immunizations: reviewed/updated in Epic  ROS: As above in the HPI. All other systems are stable or negative.  OBJECTIVE: APPEARANCE:  Patient in no acute distress.The patient appeared well nourished and normally developed. Acyanotic. Waist: VITAL SIGNS:BP 143/68  Pulse 70  Temp(Src) 97.9 F (36.6 C) (Oral)  Wt 199 lb 6.4 oz (90.447 kg)  BMI 35.33 kg/m2 Obese WF  SKIN: warm and  Dry without overt rashes, tattoos and scars  HEAD and Neck: without JVD, Head and scalp: normal Eyes:No scleral icterus. Fundi normal, eye movements normal. Ears: Auricle normal, canal normal, Tympanic membranes normal, insufflation normal. Nose: normal Throat: normal Neck & thyroid: normal  CHEST & LUNGS: Chest wall: normal Lungs: Clear  CVS: Reveals the PMI to be normally located. Regular rhythm, First and Second Heart sounds are normal,  absence of murmurs, rubs or gallops. Peripheral vasculature: Radial pulses: normal Dorsal pedis pulses: normal Posterior pulses: normal  ABDOMEN:  Appearance:  Obese Benign,, no organomegaly, no masses, no Abdominal Aortic enlargement. No Guarding , no rebound. No Bruits. Bowel sounds: normal  RECTAL: N/A GU: N/A  EXTREMETIES: nonedematous. Both Femoral and Pedal pulses are normal.  MUSCULOSKELETAL:  Spine: normal Joints: intact  NEUROLOGIC: oriented to time,place and person; nonfocal. Strength is normal Sensory is normal Reflexes are normal Cranial Nerves are normal.  ASSESSMENT: GERD (gastroesophageal reflux disease) - Plan: esomeprazole (NEXIUM) 40 MG capsule  HTN (hypertension) - Plan: metoprolol succinate (TOPROL-XL) 25 MG 24 hr tablet, COMPLETE METABOLIC PANEL WITH GFR  Other malaise and fatigue - Plan: POCT CBC, TSH, Vitamin B12, Folate  Diabetes - Plan: insulin aspart protamine- aspart (NOVOLOG 70/30) (70-30) 100 UNIT/ML injection, Insulin Syringe-Needle U-100 (INSULIN SYRINGE .3CC/31GX5/16") 31G X 5/16" 0.3 ML MISC, Insulin Syringe-Needle U-100 (INSULIN SYRINGE 1CC/31GX5/16") 31G X 5/16" 1 ML MISC, POCT glycosylated hemoglobin (Hb A1C), COMPLETE METABOLIC PANEL WITH GFR  Insomnia - Plan: traZODone (DESYREL) 150 MG tablet  HLD (hyperlipidemia) - Plan: COMPLETE METABOLIC PANEL WITH GFR, NMR Lipoprofile with Lipids  Depression - Plan: traZODone (DESYREL) 150 MG tablet  Large swings in sugar. Needs to be better DM control. Will have her see the clinical Pharmacist to review insulin diet etc.  PLAN: Orders Placed This Encounter  Procedures  . COMPLETE METABOLIC PANEL WITH GFR  . NMR Lipoprofile with Lipids  . TSH  . Vitamin B12  . Folate  . POCT glycosylated hemoglobin (Hb A1C)  . POCT CBC        Dr Woodroe Mode Recommendations  Diet and Exercise discussed with patient.  For nutrition information, I recommend books:  1).Eat to Live by Dr Monico Hoar. 2).Prevent and Reverse Heart Disease by Dr Suzzette Righter. 3) Dr Katherina Right Book: Reversing Diabetes  Exercise recommendations are:  If unable  to  walk, then the patient can exercise in a chair 3 times a day. By flapping arms like a bird gently and raising legs outwards to the front.  If ambulatory, the patient can go for walks for 30 minutes 3 times a week. Then increase the intensity and duration as tolerated.  Goal is to try to attain exercise frequency to 5 times a week.  If applicable: Best to perform resistance exercises (machines or weights) 2 days a week and cardio type exercises 3 days per week.   Reduce the trazodone to a 1/2 tab daily. Stop the magnesium.  Return in 6 weeks to see how you are before cutting other medicines.  2 week appointment to see Pharmacist for Diabetes management.  Meds ordered this encounter  Medications  . hyoscyamine (LEVBID) 0.375 MG 12 hr tablet    Sig: Take 0.375 mg by mouth every 12 (twelve) hours as needed for cramping.  Marland Kitchen esomeprazole (NEXIUM) 40 MG capsule    Sig: Take 1 capsule (40 mg total) by mouth daily.    Dispense:  90 capsule    Refill:  3  . metoprolol succinate (TOPROL-XL) 25 MG 24 hr tablet    Sig: Take 0.5 tablets (12.5 mg total) by mouth at bedtime.    Dispense:  45 tablet    Refill:  3  . insulin aspart protamine- aspart (NOVOLOG 70/30) (70-30) 100 UNIT/ML injection    Sig: Inject 0.45-0.5 mLs (45-50 Units total) into the skin 2 (two) times daily with a meal. 50 units in the morning and 45 units in the evening    Dispense:  100 mL    Refill:  3  . traZODone (DESYREL) 150 MG tablet    Sig: Take 1 tablet (150 mg total) by mouth at bedtime.    Dispense:  90 tablet    Refill:  3  . Insulin Syringe-Needle U-100 (INSULIN SYRINGE .3CC/31GX5/16") 31G X 5/16" 0.3 ML MISC    Sig: 1 each by Does not apply route as directed.    Dispense:  200 each    Refill:  3  . Insulin Syringe-Needle U-100 (INSULIN SYRINGE 1CC/31GX5/16") 31G X 5/16" 1 ML MISC    Sig: 1 each by Does not apply route as directed.    Dispense:  200 each    Refill:  3  Rx printed for mail order Rx at the  [place]. Discussed meds and  Diet with patient.  Return in about 2 weeks (around 04/06/2013) for see pharmacist.  Maryla Morrow. [place] Charon, M.D.

## 2013-03-23 NOTE — Patient Instructions (Addendum)
      Dr Woodroe Mode Recommendations  Diet and Exercise discussed with patient.  For nutrition information, I recommend books:  1).Eat to Live by Dr Monico Hoar. 2).Prevent and Reverse Heart Disease by Dr Suzzette Righter. 3) Dr Katherina Right Book: Reversing Diabetes  Exercise recommendations are:  If unable to walk, then the patient can exercise in a chair 3 times a day. By flapping arms like a bird gently and raising legs outwards to the front.  If ambulatory, the patient can go for walks for 30 minutes 3 times a week. Then increase the intensity and duration as tolerated.  Goal is to try to attain exercise frequency to 5 times a week.  If applicable: Best to perform resistance exercises (machines or weights) 2 days a week and cardio type exercises 3 days per week.   Reduce the trazodone to a 1/2 tab daily. Stop the magnesium.  Return in 6 weeks to see how you are before cutting other medicines.  2 week appointment to see Pharmacist for Diabetes management.

## 2013-03-27 LAB — NMR LIPOPROFILE WITH LIPIDS
Cholesterol, Total: 119 mg/dL (ref <200)
HDL Particle Number: 30.3 umol/L — ABNORMAL LOW (ref >=30.5)
HDL Size: 9.9 nm (ref >=9.2)
HDL-C: 54 mg/dL (ref >=40)
LDL (calc): 50 mg/dL (ref <100)
LDL Particle Number: 678 nmol/L (ref <1000)
LDL Size: 20.5 nm — ABNORMAL LOW (ref >20.5)
LP-IR Score: 30 (ref <=45)
Large HDL-P: 10.7 umol/L (ref >=4.8)
Large VLDL-P: 2.3 nmol/L (ref <=2.7)
Small LDL Particle Number: 338 nmol/L (ref <=527)
Triglycerides: 73 mg/dL (ref <150)
VLDL Size: 47.1 nm — ABNORMAL HIGH (ref <=46.6)

## 2013-03-27 NOTE — Progress Notes (Signed)
Quick Note:  Lab result at goal. No change in Medications for now. No Change in plans and follow up. ______ 

## 2013-04-04 DIAGNOSIS — R109 Unspecified abdominal pain: Secondary | ICD-10-CM | POA: Diagnosis not present

## 2013-04-05 ENCOUNTER — Encounter: Payer: Self-pay | Admitting: Pharmacist

## 2013-04-05 ENCOUNTER — Ambulatory Visit (INDEPENDENT_AMBULATORY_CARE_PROVIDER_SITE_OTHER): Payer: Medicare Other | Admitting: Pharmacist

## 2013-04-05 VITALS — BP 138/78 | HR 80 | Ht 63.0 in | Wt 197.0 lb

## 2013-04-05 DIAGNOSIS — E119 Type 2 diabetes mellitus without complications: Secondary | ICD-10-CM | POA: Diagnosis not present

## 2013-04-05 DIAGNOSIS — E109 Type 1 diabetes mellitus without complications: Secondary | ICD-10-CM

## 2013-04-05 MED ORDER — METFORMIN HCL ER 500 MG PO TB24: 500.0000 mg | ORAL_TABLET | Freq: Every day | ORAL | Status: DC

## 2013-04-05 MED ORDER — INSULIN ASPART 100 UNIT/ML ~~LOC~~ SOLN: 8.0000 [IU] | Freq: Three times a day (TID) | SUBCUTANEOUS | Status: DC | PRN

## 2013-04-05 MED ORDER — INSULIN GLARGINE 100 UNIT/ML SOLOSTAR PEN: 42.0000 [IU] | PEN_INJECTOR | Freq: Every evening | SUBCUTANEOUS | Status: DC

## 2013-04-05 NOTE — Progress Notes (Signed)
Diabetes Follow-Up Visit Chief Complaint:   Chief Complaint  Patient presents with  . Diabetes     Filed Vitals:   04/05/13 2151  BP: 138/78  Pulse: 80    HPI: patient has long standing diabetes.  Her most recent A1C was 6.1% but her home BG readings have been very erratic ranging from 40's to 200's     Current Diabetes Medications:  Novolin 70/30 (although EMR indicates Novolog mix 70/30) 50 units qam and 35 units qpm.    Polyuria:  negative  Polydipsia:  negative Polyphagia:  negatvie  BMI:  Body mass index is 34.91 kg/(m^2).   Weight changes:  stable General Appearance:  alert, oriented, no acute distress and obese Mood/Affect:  normal   Home BG Monitoring:  Checking 5 times a day. Average:  130  High: 276  Low:  44    Lab Results  Component Value Date   HGBA1C 6.1 % 03/23/2013    Lab Results  Component Value Date   MICROALBUR 0.4 04/04/2009    Lab Results  Component Value Date   CHOL 128 04/04/2009   HDL 48.20 04/04/2009   LDLCALC 50 03/23/2013   TRIG 73 03/23/2013   CHOLHDL 3 04/04/2009      Assessment: 1.  Diabetes.  Uncontrolled with wide fluctuations in BG 2.  Blood Pressure.  controlled 3.  Lipids.  controlled   Recommendations: 1.  Medication recommendations at this time are as follows:    Discontinue Novolin 70/30  Start Lantus 42 units daily  Start Novolog 8 unit tid prior to each meal  Start metformin XR 500mg  1 tablet daily with larges meal of day 2.  Reviewed HBG goals:  Fasting 80-130 and 1-2 hour post prandial <180.  Patient is instructed to check BG 5 times per day.    3.  BP goal < 140/80. 4.  LDL goal of < 100, HDL > 40 and TG < 150. 5.  Eye Exam yearly and Dental Exam every 6 months. 6.  Recheck BMP in 3 weeks 7.  Return to clinic in 3 weeks

## 2013-04-05 NOTE — Patient Instructions (Signed)
Starting tonight. Do not take novolin/novalog mix 70/30 Start novolog (orange) 8 units before each meal And start Lantus 42 units each evening/bedtime  Call if you have Blood Glucose less than 70 or over 250.  [phone]

## 2013-04-10 DIAGNOSIS — E1149 Type 2 diabetes mellitus with other diabetic neurological complication: Secondary | ICD-10-CM | POA: Diagnosis not present

## 2013-04-10 DIAGNOSIS — L609 Nail disorder, unspecified: Secondary | ICD-10-CM | POA: Diagnosis not present

## 2013-04-10 DIAGNOSIS — L851 Acquired keratosis [keratoderma] palmaris et plantaris: Secondary | ICD-10-CM | POA: Diagnosis not present

## 2013-04-11 ENCOUNTER — Telehealth: Payer: Self-pay | Admitting: Family Medicine

## 2013-04-12 NOTE — Telephone Encounter (Signed)
Needs rx for mail order and local for lantus Needs syringes for her pens local rx and mail order.  Refill novolog insulin

## 2013-04-13 ENCOUNTER — Other Ambulatory Visit: Payer: Self-pay | Admitting: Family Medicine

## 2013-04-13 DIAGNOSIS — E119 Type 2 diabetes mellitus without complications: Secondary | ICD-10-CM

## 2013-04-13 MED ORDER — INSULIN ASPART 100 UNIT/ML ~~LOC~~ SOLN: 8.0000 [IU] | Freq: Three times a day (TID) | SUBCUTANEOUS | Status: DC | PRN

## 2013-04-13 MED ORDER — "INSULIN SYRINGE 31G X 5/16"" 0.3 ML MISC": 1.0000 | Status: DC

## 2013-04-13 MED ORDER — "INSULIN SYRINGE 31G X 5/16"" 1 ML MISC": 1.0000 | Status: DC

## 2013-04-13 NOTE — Telephone Encounter (Signed)
Ordered and printed 

## 2013-04-13 NOTE — Telephone Encounter (Signed)
Pt aware rx at front desk  

## 2013-04-23 ENCOUNTER — Telehealth: Payer: Self-pay | Admitting: Pharmacist

## 2013-04-23 DIAGNOSIS — E119 Type 2 diabetes mellitus without complications: Secondary | ICD-10-CM

## 2013-04-23 MED ORDER — GLUCOSE BLOOD VI STRP: ORAL_STRIP | Status: DC

## 2013-04-23 NOTE — Telephone Encounter (Signed)
Patient states she has experienced more hypoglycemia since we changed her insulin regimen.  Currently take Lantus 42 units each evening and Novolog 8 units prior to meals.  Hypoglycemia occurs at different times of day - not set pattern.  Morning readings have been good except twice has readings over 200 and twice reading lower than 70.  Usually am readings 110's - 120's.  Continue Lantus 42 units each evening, but decrease Novolog to 6 units prior meals but if BG prior to meal if less than 80 hold dose.

## 2013-04-30 ENCOUNTER — Encounter: Payer: Self-pay | Admitting: Pharmacist

## 2013-04-30 ENCOUNTER — Ambulatory Visit (INDEPENDENT_AMBULATORY_CARE_PROVIDER_SITE_OTHER): Payer: Medicare Other | Admitting: Pharmacist

## 2013-04-30 VITALS — BP 128/62 | HR 70 | Wt 195.2 lb

## 2013-04-30 DIAGNOSIS — R635 Abnormal weight gain: Secondary | ICD-10-CM

## 2013-04-30 DIAGNOSIS — E119 Type 2 diabetes mellitus without complications: Secondary | ICD-10-CM

## 2013-04-30 DIAGNOSIS — Z01419 Encounter for gynecological examination (general) (routine) without abnormal findings: Secondary | ICD-10-CM | POA: Diagnosis not present

## 2013-04-30 DIAGNOSIS — N909 Noninflammatory disorder of vulva and perineum, unspecified: Secondary | ICD-10-CM | POA: Diagnosis not present

## 2013-04-30 DIAGNOSIS — E669 Obesity, unspecified: Secondary | ICD-10-CM

## 2013-04-30 MED ORDER — METFORMIN HCL ER 500 MG PO TB24: 500.0000 mg | ORAL_TABLET | Freq: Every day | ORAL | Status: DC

## 2013-04-30 NOTE — Progress Notes (Signed)
Diabetes Follow-Up Visit Chief Complaint:   Chief Complaint  Patient presents with  . Diabetes     Filed Vitals:   04/30/13 1632  BP: 128/62  Pulse: 70    HPI: patient has long standing diabetes.  Her most recent A1C was 6.1% but her home BG readings have been very erratic ranging from 40's to 200's.  Last visit 04/05/2013 started novolog with meal and metformin XR.  Current Diabetes Medications:    Lantus 42 units daily in the evening, Novolog 6 units tid prior to each meal  Metformin XR 500mg  daily with largest meal of day/breakfast  Polyuria:  negative  Polydipsia:  negative Polyphagia:  negatvie  BMI:  Body mass index is 34.6 kg/(m^2).   Weight changes:  stable General Appearance:  alert, oriented, no acute distress and obese Mood/Affect:  normal  Diet - is trying to limit CHO intake.    Home BG Monitoring:  Checking 5 times a day. Average:  142     High: 216 Low:  54    Lab Results  Component Value Date   HGBA1C 6.1 % 03/23/2013    Lab Results  Component Value Date   MICROALBUR 0.4 04/04/2009    Lab Results  Component Value Date   CHOL 128 04/04/2009   HDL 48.20 04/04/2009   LDLCALC 50 03/23/2013   TRIG 73 03/23/2013   CHOLHDL 3 04/04/2009      Assessment: 1.  Diabetes.  Wide fluctuations in BG are improving.  2.  Blood Pressure.  controlled 3.  Lipids.  Controlled 4.  Obesity - patient has lost 4.5# since 03/23/2013   Recommendations: 1.  Medication recommendations at this time are as follows:    Continue Lantus 42 units daily  Continue Novolog 6 unit tid prior to each meal  Continue metformin XR 500mg  1 tablet daily with largest meal of day 2.  Reviewed HBG goals:  Fasting 80-130 and 1-2 hour post prandial <180.  Patient is instructed to check BG 3 to 5 times per day.    3.  BP goal < 140/80. 4.  LDL goal of < 100, HDL > 40 and TG < 150. 5.  Eye Exam yearly and Dental Exam every 6 months. 6.  Return to clinic in 1 week to see Dr. Modesto Charon and 1 month to  see clinical pharmacist

## 2013-05-07 ENCOUNTER — Telehealth: Payer: Self-pay | Admitting: Family Medicine

## 2013-05-07 NOTE — Telephone Encounter (Signed)
Last pm BG was 80 and this am it was in the 50's.  Patient wanted to know if she could adjust her insulin if BG in the pm is low.   I recommended she do 1 of 2 things: 1) have a snack prior to bedtime to prevent a low during the night or in the morning.  Or 2) decrease by 5 units of lantus insulin her pm dose (from 42 units to 37 units) if BG is 90 or less.  Patient to notify me if has more hypoglycemia.

## 2013-05-08 ENCOUNTER — Ambulatory Visit (INDEPENDENT_AMBULATORY_CARE_PROVIDER_SITE_OTHER): Payer: Medicare Other | Admitting: Family Medicine

## 2013-05-08 ENCOUNTER — Encounter: Payer: Self-pay | Admitting: Family Medicine

## 2013-05-08 VITALS — BP 118/67 | HR 81 | Temp 98.0°F | Wt 194.4 lb

## 2013-05-08 DIAGNOSIS — R32 Unspecified urinary incontinence: Secondary | ICD-10-CM

## 2013-05-08 DIAGNOSIS — E119 Type 2 diabetes mellitus without complications: Secondary | ICD-10-CM | POA: Diagnosis not present

## 2013-05-08 DIAGNOSIS — IMO0001 Reserved for inherently not codable concepts without codable children: Secondary | ICD-10-CM

## 2013-05-08 DIAGNOSIS — M810 Age-related osteoporosis without current pathological fracture: Secondary | ICD-10-CM

## 2013-05-08 DIAGNOSIS — M545 Low back pain, unspecified: Secondary | ICD-10-CM

## 2013-05-08 DIAGNOSIS — G4700 Insomnia, unspecified: Secondary | ICD-10-CM

## 2013-05-08 DIAGNOSIS — F329 Major depressive disorder, single episode, unspecified: Secondary | ICD-10-CM

## 2013-05-08 DIAGNOSIS — I10 Essential (primary) hypertension: Secondary | ICD-10-CM

## 2013-05-08 DIAGNOSIS — F3289 Other specified depressive episodes: Secondary | ICD-10-CM

## 2013-05-08 DIAGNOSIS — K589 Irritable bowel syndrome without diarrhea: Secondary | ICD-10-CM

## 2013-05-08 DIAGNOSIS — E785 Hyperlipidemia, unspecified: Secondary | ICD-10-CM

## 2013-05-08 DIAGNOSIS — I059 Rheumatic mitral valve disease, unspecified: Secondary | ICD-10-CM

## 2013-05-08 DIAGNOSIS — K219 Gastro-esophageal reflux disease without esophagitis: Secondary | ICD-10-CM

## 2013-05-08 NOTE — Progress Notes (Signed)
Patient ID: [name], female   DOB: [birth year], 72 y.o.   MRN: [id number] SUBJECTIVE: CC: Chief Complaint  Patient presents with  . Follow-up    diabetes  6 wk reck     HPI: Patient is here for follow up of Diabetes Mellitus: Symptoms of DM: Denies Nocturia ,Denies Urinary Frequency , denies Blurred vision ,deniesDizziness,denies.Dysuria,denies paresthesias, denies extremity pain or ulcers.Marland Kitchendenies chest pain. has had an annual eye exam. do check the feet. Does check CBGs. Average WUJ:WJXBJ better. Has seen the clinical pharmacist. Denies episodes of hypoglycemia. Does have an emergency hypoglycemic plan. admits toCompliance with medications. Denies Problems with medications.  Past Medical History  Diagnosis Date  . Fibromyalgia   . IBS (irritable bowel syndrome)   . Diabetic retinopathy   . Osteoporosis   . Acute cystitis   . Depression   . Diabetes mellitus type II   . Hyperlipidemia   . Hypertension   . GERD (gastroesophageal reflux disease)   . Insomnia   . Urge incontinence of urine   . IBS (irritable bowel syndrome)   . Postmenopausal   . Lichen planus     vulvar  . DM type 2 causing CKD stage 3    Past Surgical History  Procedure Laterality Date  . Cataract extraction w/ intraocular lens implant  09/2003, 12/2003  . Cholecystectomy  2004  . Tubal ligation  1970  . Ankle surgery  02/2004  . Total knee arthroplasty      Left  . Eye surgery      laser treatments   History   Social History  . Marital Status: Widowed    Spouse Name: N/A    Number of Children: N/A  . Years of Education: N/A   Occupational History  . retired    Social History Main Topics  . Smoking status: Never Smoker   . Smokeless tobacco: Not on file  . Alcohol Use: No  . Drug Use: No  . Sexually Active: Not on file   Other Topics Concern  . Not on file   Social History Narrative   Widowed in 2010   Family History  Problem Relation Age of Onset  . Diabetes Other     Current Outpatient Prescriptions on File Prior to Visit  Medication Sig Dispense Refill  . Acetaminophen (TYLENOL PO) Take by mouth as needed.      Marland Kitchen aspirin 81 MG tablet Take 81 mg by mouth daily.        Marland Kitchen atorvastatin (LIPITOR) 20 MG tablet Take 10 mg by mouth at bedtime.       . Calcium Carb-Cholecalciferol (CALCIUM 600+D3) 600-200 MG-UNIT TABS Take 1 tablet by mouth 2 (two) times daily.        Marland Kitchen desonide (DESOWEN) 0.05 % ointment Apply 1 application topically 2 (two) times daily.       . diphenoxylate-atropine (LOMOTIL) 2.5-0.025 MG per tablet Take 1 tablet by mouth 4 (four) times daily as needed for diarrhea or loose stools.      . DULoxetine (CYMBALTA) 60 MG capsule Take 60 mg by mouth daily.        Marland Kitchen esomeprazole (NEXIUM) 40 MG capsule Take 1 capsule (40 mg total) by mouth daily.  90 capsule  3  . estradiol (ESTRACE) 0.1 MG/GM vaginal cream Place 2 g vaginally every Monday, Wednesday, and Friday.       . fish oil-omega-3 fatty acids 1000 MG capsule Take 1 capsule by mouth 2 (two) times daily.       Marland Kitchen  glucose blood (FREESTYLE LITE) test strip Use as instructed to check BG up to qid. Dx: type 2 DM uncontrolled with insulin use  200 each  3  . hyoscyamine (LEVBID) 0.375 MG 12 hr tablet Take 0.375 mg by mouth every 12 (twelve) hours as needed for cramping.      . insulin aspart (NOVOLOG) 100 UNIT/ML injection Inject 6 Units into the skin 3 (three) times daily as needed. Sliding scale  15 mL  3  . Insulin Glargine (LANTUS SOLOSTAR) 100 UNIT/ML SOPN Inject 42 Units into the skin every evening.  5 pen  1  . Insulin Syringe-Needle U-100 (INSULIN SYRINGE .3CC/31GX5/16") 31G X 5/16" 0.3 ML MISC 1 each by Does not apply route as directed.  200 each  3  . Insulin Syringe-Needle U-100 (INSULIN SYRINGE 1CC/31GX5/16") 31G X 5/16" 1 ML MISC 1 each by Does not apply route as directed.  200 each  3  . metFORMIN (GLUCOPHAGE XR) 500 MG 24 hr tablet Take 1 tablet (500 mg total) by mouth daily with  breakfast. Note prescription is for the XR/extended release metformin,  90 tablet  1  . metoprolol succinate (TOPROL-XL) 25 MG 24 hr tablet Take 0.5 tablets (12.5 mg total) by mouth at bedtime.  45 tablet  3  . mirabegron ER (MYRBETRIQ) 50 MG TB24 Take 50 mg by mouth daily.      . Multiple Vitamin (MULTIVITAMIN) tablet Take 0.5 tablets by mouth 2 (two) times daily.       . nitrofurantoin (MACRODANTIN) 100 MG capsule Take 100 mg by mouth daily.        . Probiotic Product (PROBIOTIC DAILY PO) Take by mouth.      . traZODone (DESYREL) 150 MG tablet Take 1 tablet (150 mg total) by mouth at bedtime.  90 tablet  3  . vitamin B-12 (CYANOCOBALAMIN) 100 MCG tablet Take 50 mcg by mouth daily.        Marland Kitchen darifenacin (ENABLEX) 15 MG 24 hr tablet Take 15 mg by mouth daily.       No current facility-administered medications on file prior to visit.   Allergies  Allergen Reactions  . Latex Anaphylaxis and Rash  . Amoxicillin   . Celebrex (Celecoxib)   . Fluticasone   . Hydrocodone   . Hydrocodone-Acetaminophen Other (See Comments)    hallucinations  . Hyoscyamine Sulfate   . Morphine     REACTION: hallucinations,GI upset  . Tramadol   . Vicoprofen (Hydrocodone-Ibuprofen)   . Zolpidem Tartrate     REACTION: hallucinations  . Prednisone Rash and Other (See Comments)    Hyperglycemia   . Sulfa Antibiotics Rash   Immunization History  Administered Date(s) Administered  . Pneumococcal Polysaccharide 10/22/2009  . Td 10/25/2004  . Zoster 04/06/2012   Prior to Admission medications   Medication Sig Start Date End Date Taking? Authorizing Provider  Acetaminophen (TYLENOL PO) Take by mouth as needed.   Yes Historical Provider, MD  aspirin 81 MG tablet Take 81 mg by mouth daily.     Yes Historical Provider, MD  atorvastatin (LIPITOR) 20 MG tablet Take 10 mg by mouth at bedtime.    Yes Historical Provider, MD  Calcium Carb-Cholecalciferol (CALCIUM 600+D3) 600-200 MG-UNIT TABS Take 1 tablet by mouth 2  (two) times daily.     Yes Historical Provider, MD  desonide (DESOWEN) 0.05 % ointment Apply 1 application topically 2 (two) times daily.    Yes Historical Provider, MD  diphenoxylate-atropine (LOMOTIL) 2.5-0.025 MG per tablet Take  1 tablet by mouth 4 (four) times daily as needed for diarrhea or loose stools.   Yes Historical Provider, MD  DULoxetine (CYMBALTA) 60 MG capsule Take 60 mg by mouth daily.     Yes Historical Provider, MD  esomeprazole (NEXIUM) 40 MG capsule Take 1 capsule (40 mg total) by mouth daily. 03/23/13  Yes Ileana Ladd, MD  estradiol (ESTRACE) 0.1 MG/GM vaginal cream Place 2 g vaginally every Monday, Wednesday, and Friday.    Yes Historical Provider, MD  fish oil-omega-3 fatty acids 1000 MG capsule Take 1 capsule by mouth 2 (two) times daily.    Yes Historical Provider, MD  glucose blood (FREESTYLE LITE) test strip Use as instructed to check BG up to qid. Dx: type 2 DM uncontrolled with insulin use 04/23/13  Yes Tammy Eckard, PHARMD  hyoscyamine (LEVBID) 0.375 MG 12 hr tablet Take 0.375 mg by mouth every 12 (twelve) hours as needed for cramping.   Yes Historical Provider, MD  insulin aspart (NOVOLOG) 100 UNIT/ML injection Inject 6 Units into the skin 3 (three) times daily as needed. Sliding scale 04/23/13  Yes Tammy Eckard, PHARMD  Insulin Glargine (LANTUS SOLOSTAR) 100 UNIT/ML SOPN Inject 42 Units into the skin every evening. 04/05/13  Yes Tammy Eckard, PHARMD  Insulin Syringe-Needle U-100 (INSULIN SYRINGE .3CC/31GX5/16") 31G X 5/16" 0.3 ML MISC 1 each by Does not apply route as directed. 04/13/13  Yes Ileana Ladd, MD  Insulin Syringe-Needle U-100 (INSULIN SYRINGE 1CC/31GX5/16") 31G X 5/16" 1 ML MISC 1 each by Does not apply route as directed. 04/13/13  Yes Ileana Ladd, MD  metFORMIN (GLUCOPHAGE XR) 500 MG 24 hr tablet Take 1 tablet (500 mg total) by mouth daily with breakfast. Note prescription is for the XR/extended release metformin, 04/30/13  Yes Tammy Eckard, PHARMD   metoprolol succinate (TOPROL-XL) 25 MG 24 hr tablet Take 0.5 tablets (12.5 mg total) by mouth at bedtime. 03/23/13  Yes Ileana Ladd, MD  mirabegron ER (MYRBETRIQ) 50 MG TB24 Take 50 mg by mouth daily.   Yes Historical Provider, MD  Multiple Vitamin (MULTIVITAMIN) tablet Take 0.5 tablets by mouth 2 (two) times daily.    Yes Historical Provider, MD  nitrofurantoin (MACRODANTIN) 100 MG capsule Take 100 mg by mouth daily.     Yes Historical Provider, MD  Probiotic Product (PROBIOTIC DAILY PO) Take by mouth.   Yes Historical Provider, MD  traZODone (DESYREL) 150 MG tablet Take 1 tablet (150 mg total) by mouth at bedtime. 03/23/13  Yes Ileana Ladd, MD  triamterene-hydrochlorothiazide (MAXZIDE-25) 37.5-25 MG per tablet Take by mouth. Take 1/2 tablet daily   Yes Historical Provider, MD  vitamin B-12 (CYANOCOBALAMIN) 100 MCG tablet Take 50 mcg by mouth daily.     Yes Historical Provider, MD  darifenacin (ENABLEX) 15 MG 24 hr tablet Take 15 mg by mouth daily.    Historical Provider, MD    ROS: As above in the HPI. All other systems are stable or negative.  OBJECTIVE: APPEARANCE:  Patient in no acute distress.The patient appeared well nourished and normally developed. Acyanotic. Waist: VITAL SIGNS:  SKIN: warm and  Dry without overt rashes, tattoos and scars  HEAD and Neck: without JVD, Head and scalp: normal Eyes:No scleral icterus. Fundi normal, eye movements normal. Ears: Auricle normal, canal normal, Tympanic membranes normal, insufflation normal. Nose: normal Throat: normal Neck & thyroid: normal  CHEST & LUNGS: Chest wall: normal Lungs: Clear  CVS: Reveals the PMI to be normally located. Regular rhythm, First and  Second Heart sounds are normal,  absence of murmurs, rubs or gallops. Peripheral vasculature: Radial pulses: normal Dorsal pedis pulses: normal Posterior pulses: normal  ABDOMEN:  Appearance: normal Benign, no organomegaly, no masses, no Abdominal Aortic  enlargement. No Guarding , no rebound. No Bruits. Bowel sounds: normal  RECTAL: N/A GU: N/A  EXTREMETIES: nonedematous. Both Femoral and Pedal pulses are normal.  MUSCULOSKELETAL:  Spine: normal Joints: intact  NEUROLOGIC: oriented to time,place and person; nonfocal. Strength is normal Sensory is normal Reflexes are normal Cranial Nerves are normal.  Results for orders placed in visit on 03/23/13  COMPLETE METABOLIC PANEL WITH GFR      Result Value Range   Sodium 138  135 - 145 mEq/L   Potassium 4.2  3.5 - 5.3 mEq/L   Chloride 96  96 - 112 mEq/L   CO2 34 (*) 19 - 32 mEq/L   Glucose, Bld 90  70 - 99 mg/dL   BUN 21  6 - 23 mg/dL   Creat 2.95 (*) 6.21 - 1.10 mg/dL   Total Bilirubin 0.5  0.3 - 1.2 mg/dL   Alkaline Phosphatase 63  39 - 117 U/L   AST 22  0 - 37 U/L   ALT 18  0 - 35 U/L   Total Protein 6.6  6.0 - 8.3 g/dL   Albumin 4.2  3.5 - 5.2 g/dL   Calcium 9.6  8.4 - 30.8 mg/dL   GFR, Est African American 53 (*)    GFR, Est Non African American 46 (*)   NMR LIPOPROFILE WITH LIPIDS      Result Value Range   LDL Particle Number 678  <1000 nmol/L   LDL (calc) 50  <100 mg/dL   HDL-C 54  >=65 mg/dL   Triglycerides 73  <784 mg/dL   Cholesterol, Total 696  <200 mg/dL   HDL Particle Number 29.5 (*) >=30.5 umol/L   Large HDL-P 10.7  >=4.8 umol/L   Large VLDL-P 2.3  <=2.7 nmol/L   Small LDL Particle Number 338  <=527 nmol/L   LDL Size 20.5 (*) >20.5 nm   HDL Size 9.9  >=9.2 nm   VLDL Size 47.1 (*) <=46.6 nm   LP-IR Score 30  <=45  TSH      Result Value Range   TSH 3.170  0.350 - 4.500 uIU/mL  VITAMIN B12      Result Value Range   Vitamin B-12 1131 (*) 211 - 911 pg/mL  FOLATE      Result Value Range   Folate >20.0    POCT GLYCOSYLATED HEMOGLOBIN (HGB A1C)      Result Value Range   Hemoglobin A1C 6.1 %    POCT CBC      Result Value Range   WBC 7.0  4.6 - 10.2 K/uL   Lymph, poc 2.4  0.6 - 3.4   POC LYMPH PERCENT 34.1  10 - 50 %L   POC Granulocyte 4.2  2 - 6.9    Granulocyte percent 60.5  37 - 80 %G   RBC 4.7  4.04 - 5.48 M/uL   Hemoglobin 14.5  12.2 - 16.2 g/dL   HCT, POC 28.4  13.2 - 47.9 %   MCV 91.8  80 - 97 fL   MCH, POC 31.2  27 - 31.2 pg   MCHC 34.0  31.8 - 35.4 g/dL   RDW, POC 44.0     Platelet Count, POC 164.0  142 - 424 K/uL   MPV 7.6  0 -  99.8 fL    ASSESSMENT:  Diabetes  URINARY INCONTINENCE  UNSPECIFIED OSTEOPOROSIS  MITRAL VALVE PROLAPSE  Irritable bowel syndrome  INSOMNIA  HYPERTENSION  HYPERLIPIDEMIA  GERD (gastroesophageal reflux disease)  FIBROMYALGIA  BACK PAIN, LUMBAR  DEPRESSION  PLAN: No orders of the defined types were placed in this encounter.   Meds ordered this encounter  Medications  . triamterene-hydrochlorothiazide (MAXZIDE-25) 37.5-25 MG per tablet    Sig: Take by mouth. Take 1/2 tablet daily  reviewed visits with pharmacist abs performed and progress made  Return in about 3 months (around 08/08/2013) for Recheck medical problems.  Akaiya Touchette P. Modesto Charon, M.D.

## 2013-05-08 NOTE — Patient Instructions (Signed)
      Dr Keyonda Bickle's Recommendations  Diet and Exercise discussed with patient.  For nutrition information, I recommend books:  1).Eat to Live by Dr Joel Fuhrman. 2).Prevent and Reverse Heart Disease by Dr Caldwell Esselstyn. 3) Dr Neal Barnard's Book:  Program to Reverse Diabetes  Exercise recommendations are:  If unable to walk, then the patient can exercise in a chair 3 times a day. By flapping arms like a bird gently and raising legs outwards to the front.  If ambulatory, the patient can go for walks for 30 minutes 3 times a week. Then increase the intensity and duration as tolerated.  Goal is to try to attain exercise frequency to 5 times a week.  If applicable: Best to perform resistance exercises (machines or weights) 2 days a week and cardio type exercises 3 days per week.  

## 2013-05-24 DIAGNOSIS — E11311 Type 2 diabetes mellitus with unspecified diabetic retinopathy with macular edema: Secondary | ICD-10-CM | POA: Diagnosis not present

## 2013-05-24 DIAGNOSIS — H26499 Other secondary cataract, unspecified eye: Secondary | ICD-10-CM | POA: Diagnosis not present

## 2013-05-24 DIAGNOSIS — E11329 Type 2 diabetes mellitus with mild nonproliferative diabetic retinopathy without macular edema: Secondary | ICD-10-CM | POA: Diagnosis not present

## 2013-06-07 ENCOUNTER — Ambulatory Visit (INDEPENDENT_AMBULATORY_CARE_PROVIDER_SITE_OTHER): Payer: Medicare Other | Admitting: Pharmacist

## 2013-06-07 VITALS — BP 134/64 | HR 70 | Ht 63.0 in | Wt 192.0 lb

## 2013-06-07 DIAGNOSIS — E119 Type 2 diabetes mellitus without complications: Secondary | ICD-10-CM | POA: Diagnosis not present

## 2013-06-07 DIAGNOSIS — I10 Essential (primary) hypertension: Secondary | ICD-10-CM | POA: Diagnosis not present

## 2013-06-07 NOTE — Patient Instructions (Addendum)
Decrease Lantus to 37 units at bedtime Increase Novolog to 7 units prior to each meal Continue metformin XR 500mg  daily with morning   Hypoglycemia (Low Blood Sugar) Hypoglycemia is when the glucose (sugar) in your blood is too low. Hypoglycemia can happen for many reasons. It can happen to people with or without diabetes. Hypoglycemia can develop quickly and can be a medical emergency.  CAUSES  Having hypoglycemia does not mean that you will develop diabetes. Different causes include:  Missed or delayed meals or not enough carbohydrates eaten.  Medication overdose. This could be by accident or deliberate. If by accident, your medication may need to be adjusted or changed.  Exercise or increased activity without adjustments in carbohydrates or medications.  A nerve disorder that affects body functions like your heart rate, blood pressure and digestion (autonomic neuropathy).  A condition where the stomach muscles do not function properly (gastroparesis). Therefore, medications may not absorb properly.  The inability to recognize the signs of hypoglycemia (hypoglycemic unawareness).  Absorption of insulin  may be altered.  Alcohol consumption.  Pregnancy/menstrual cycles/postpartum. This may be due to hormones.  Certain kinds of tumors. This is very rare. SYMPTOMS   Sweating.  Hunger.  Dizziness.  Blurred vision.  Drowsiness.  Weakness.  Headache.  Rapid heart beat.  Shakiness.  Nervousness. DIAGNOSIS  Diagnosis is made by monitoring blood glucose in one or all of the following ways:  Fingerstick blood glucose monitoring.  Laboratory results. TREATMENT  If you think your blood glucose is low:  Check your blood glucose, if possible. If it is less than 70 mg/dl, take one of the following:  3-4 glucose tablets.   cup juice (prefer clear like apple).   cup "regular" soda pop.  1 cup milk.  -1 tube of glucose gel.  5-6 hard candies.  Do not  over treat because your blood glucose (sugar) will only go too high.  Wait 15 minutes and recheck your blood glucose. If it is still less than 70 mg/dl (or below your target range), repeat treatment.  Eat a snack if it is more than one hour until your next meal. Sometimes, your blood glucose may go so low that you are unable to treat yourself. You may need someone to help you. You may even pass out or be unable to swallow. This may require you to get an injection of glucagon, which raises the blood glucose. HOME CARE INSTRUCTIONS  Check blood glucose as recommended by your caregiver.  Take medication as prescribed by your caregiver.  Follow your meal plan. Do not skip meals. Eat on time.  If you are going to drink alcohol, drink it only with meals.  Check your blood glucose before driving.  Check your blood glucose before and after exercise. If you exercise longer or different than usual, be sure to check blood glucose more frequently.  Always carry treatment with you. Glucose tablets are the easiest to carry.  Always wear medical alert jewelry or carry some form of identification that states that you have diabetes. This will alert people that you have diabetes. If you have hypoglycemia, they will have a better idea on what to do. SEEK MEDICAL CARE IF:   You are having problems keeping your blood sugar at target range.  You are having frequent episodes of hypoglycemia.  You feel you might be having side effects from your medicines.  You have symptoms of an illness that is not improving after 3-4 days.  You notice  a change in vision or a new problem with your vision. SEEK IMMEDIATE MEDICAL CARE IF:   You are a family member or friend of a person whose blood glucose goes below 70 mg/dl and is accompanied by:  Confusion.  A change in mental status.  The inability to swallow.  Passing out. Document Released: 10/11/2005 Document Revised: 01/03/2012 Document Reviewed:  02/07/2012 [facility] Patient Information 2014 [place], [place].

## 2013-06-07 NOTE — Progress Notes (Signed)
Diabetes Follow-Up Visit Chief Complaint:   No chief complaint on file.    Filed Vitals:   06/07/13 1439  BP: 134/64  Pulse: 70    HPI: patient has long standing diabetes.  Her most recent A1C was 6.1% but her home BG readings have been a little errati.  On 04/05/2013 she started novolog with meal and metformin XR. She has been tolerating metformin well and BG is improving although she has had some lows in the morning and some slightly high BG readings prior to lunch and supper  Current Diabetes Medications:    Lantus 42 units daily in the evening, Novolog 6 units tid prior to each meal  Metformin XR 500mg  daily with largest meal of day/breakfast  Polyuria:  negative  Polydipsia:  negative Polyphagia:  negatvie  BMI:  Body mass index is 34.02 kg/(m^2).   Weight changes:  Has lost a total of 7.5 lbs since 03/23/2013. General Appearance:  alert, oriented, no acute distress and obese Mood/Affect:  normal  Diet - Continues to try to limit CHO intake.  Exercise:  Has started walking 2-3 times a week - usually 2 miles.   Home BG Monitoring:  Checking 4 to 5 times a day. Average:  142     High: 229 Low:  58 (has had 4 hypoglycemic episodes over last 2 weeks- mostly FBG in the morning)    Lab Results  Component Value Date   HGBA1C 6.1 % 03/23/2013    Lab Results  Component Value Date   MICROALBUR 0.4 04/04/2009    Lab Results  Component Value Date   CHOL 128 04/04/2009   HDL 48.20 04/04/2009   LDLCALC 50 03/23/2013   TRIG 73 03/23/2013   CHOLHDL 3 04/04/2009      Assessment: 1.  Diabetes.  Fluctuations in BG are improving.  2.  Blood Pressure.  controlled 3.  Lipids.  Controlled 4.  Obesity - patient has lost 7.5# since 03/23/2013   Recommendations: 1.  Medication recommendations at this time are as follows:    Decrease Lantus to 37 units daily  Increase Novolog 7 unit tid prior to each meal  Continue metformin XR 500mg  1 tablet daily with largest meal of day 2.   Reviewed HBG goals:  Fasting 80-130 and 1-2 hour post prandial <180.  Patient is instructed to check BG 3 to 5 times per day.    3.  BP goal < 140/80. 4.  LDL goal of < 100, HDL > 40 and TG < 150. 5.  Eye Exam yearly and Dental Exam every 6 months. 6.  Return to clinic in 2 weeks to see clinical pharmacist and recheck A1C.  Appt with Dr Modesto Charon - October 2014.

## 2013-06-19 DIAGNOSIS — B351 Tinea unguium: Secondary | ICD-10-CM | POA: Diagnosis not present

## 2013-06-19 DIAGNOSIS — E1149 Type 2 diabetes mellitus with other diabetic neurological complication: Secondary | ICD-10-CM | POA: Diagnosis not present

## 2013-06-19 DIAGNOSIS — L851 Acquired keratosis [keratoderma] palmaris et plantaris: Secondary | ICD-10-CM | POA: Diagnosis not present

## 2013-07-05 ENCOUNTER — Ambulatory Visit: Payer: Self-pay

## 2013-07-22 IMAGING — CT CT ANGIO CHEST
2 of 6 series · 19 of 46 positions shown · IV contrast (APPLIED)
Comparison: None.

CLINICAL DATA: Right side and right shoulder pain with inspiration.

CT ANGIOGRAPHY CHEST
TECHNIQUE: Multidetector CT imaging of the chest using the
standard protocol during bolus administration of intravenous
contrast. Multiplanar reconstructed images including MIPs were
obtained and reviewed to evaluate the vascular anatomy.
Contrast: 100mL OMNIPAQUE IOHEXOL 350 MG/ML SOLN

[Series 6: pulm embolism 1.0 b25f thin · axial · 0.62mm/px · z∈[+1089,+1349]mm · 16 of 286 slices shown]
[im 13/286  lung]
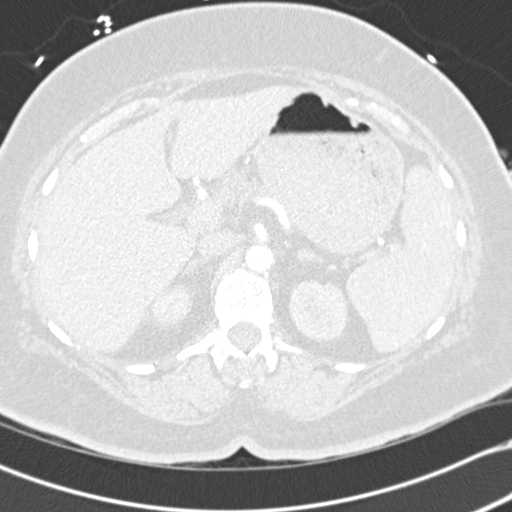
[im 38/286  soft-tissue]
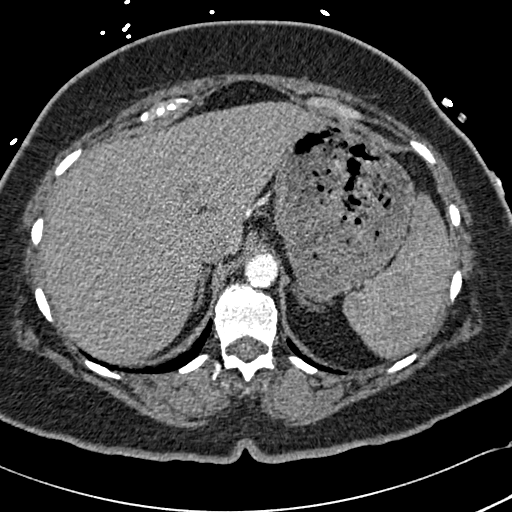
[im 50/286  lung]
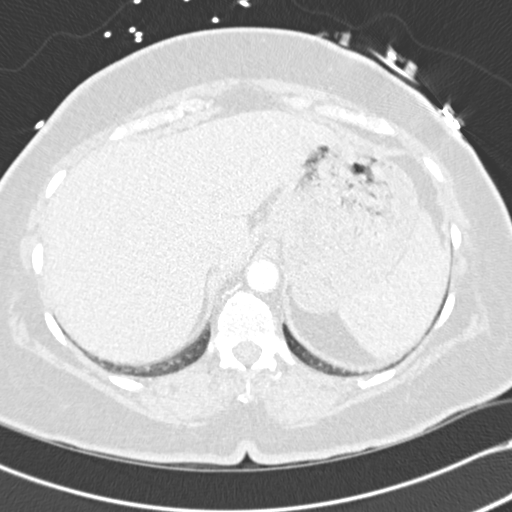
[im 62/286  soft-tissue]
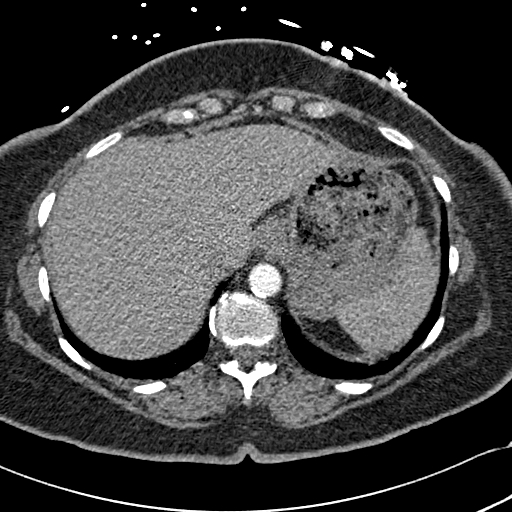
[im 87/286  lung]
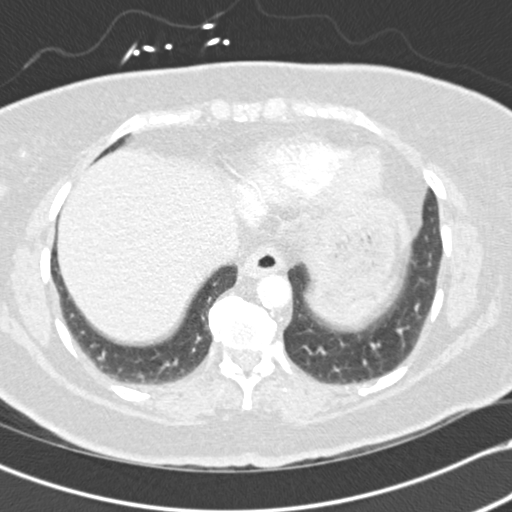
[im 100/286  soft-tissue]
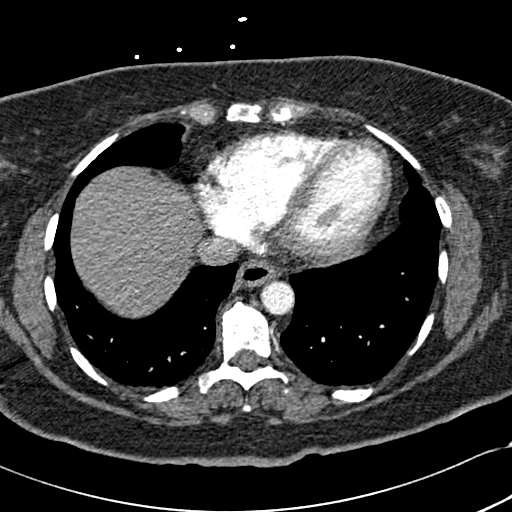
[im 112/286  lung]
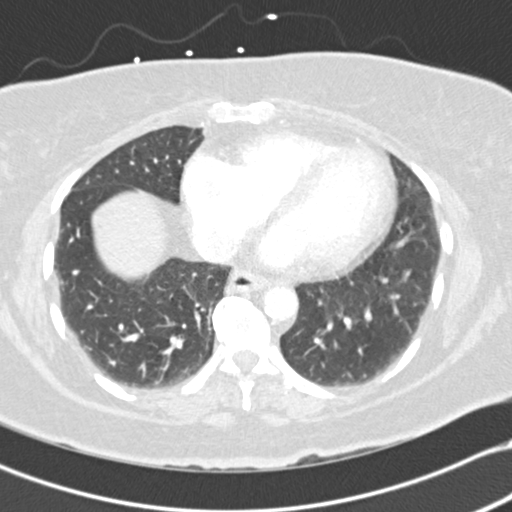
[im 137/286  soft-tissue]
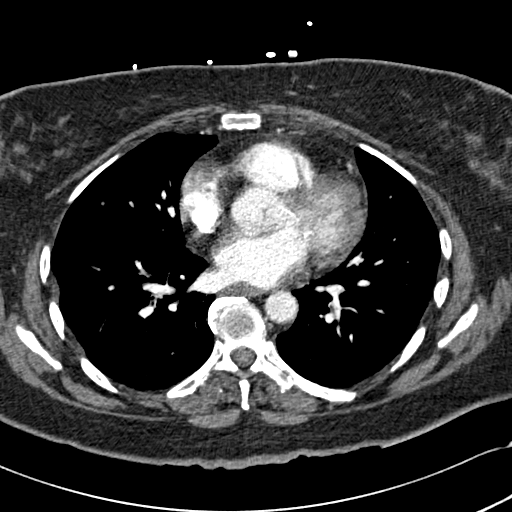
[im 149/286  lung]
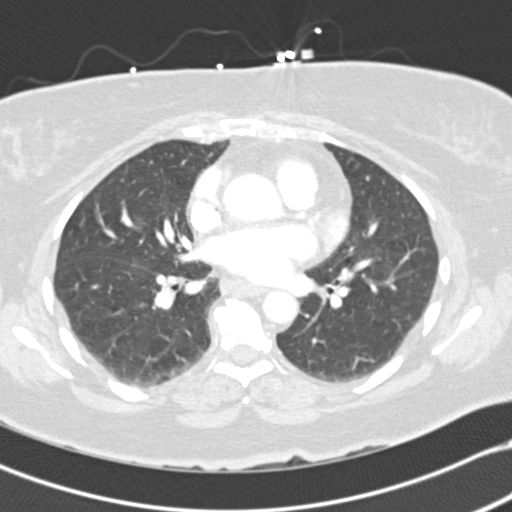
[im 174/286  soft-tissue]
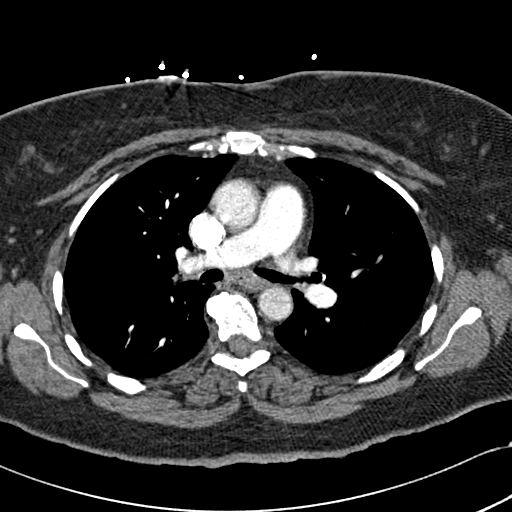
[im 186/286  lung]
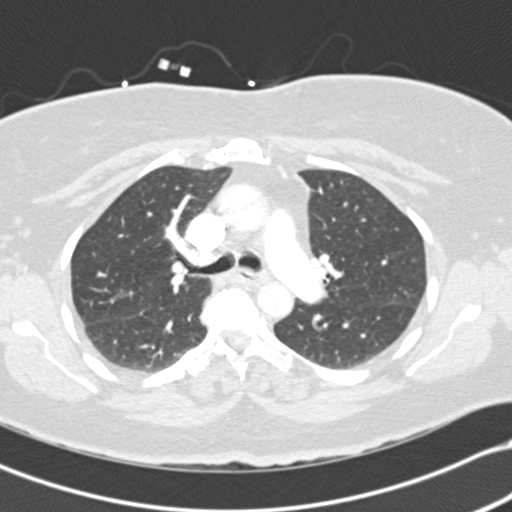
[im 199/286  soft-tissue]
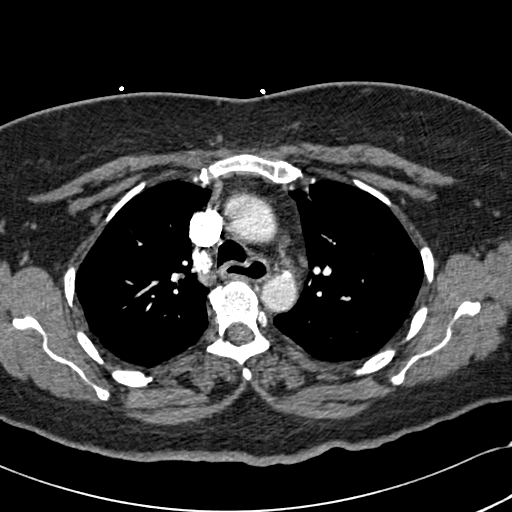
[im 224/286  lung]
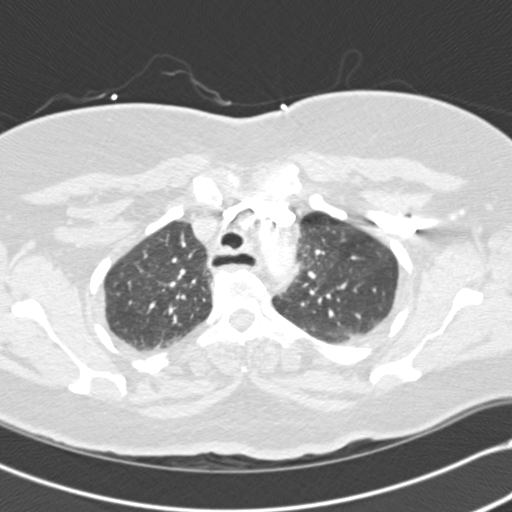
[im 236/286  soft-tissue]
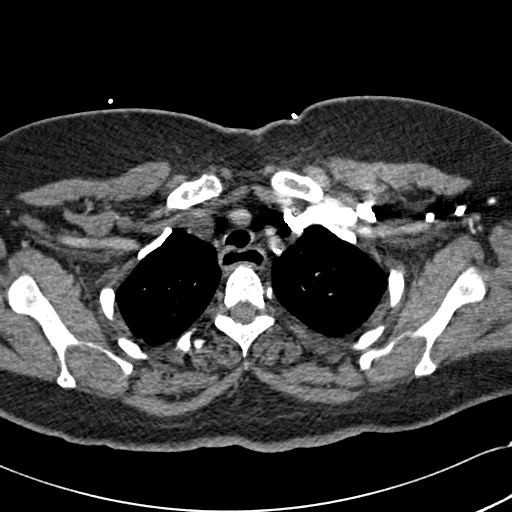
[im 248/286  lung]
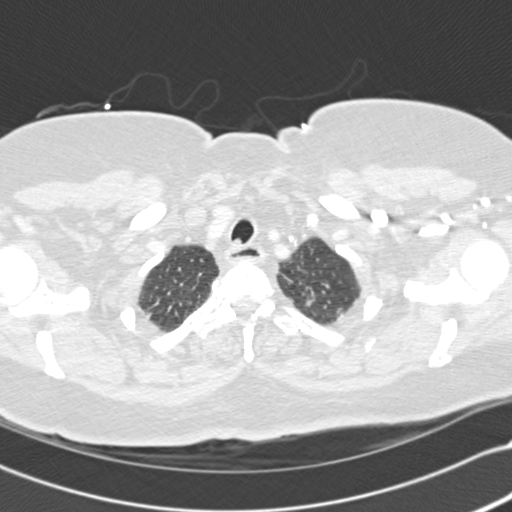
[im 273/286  soft-tissue]
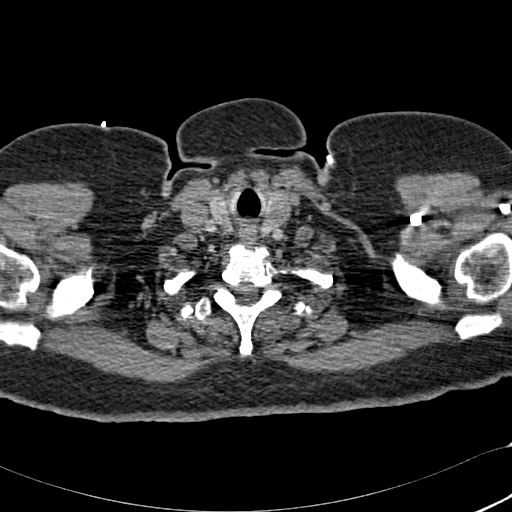

[Series 602: cor · coronal · 0.62mm/px · 3 of 105 slices shown]
[im 27/105  soft-tissue]
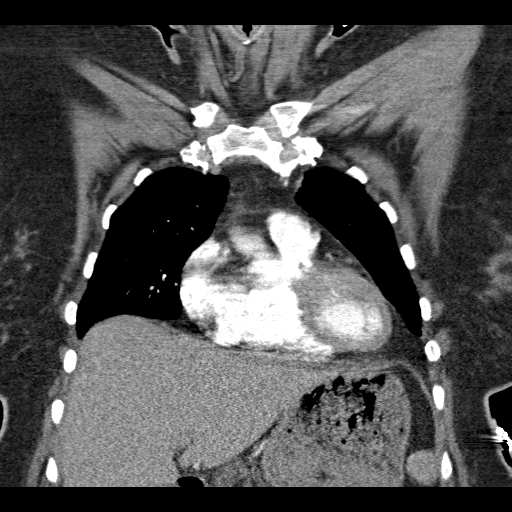
[im 53/105  soft-tissue]
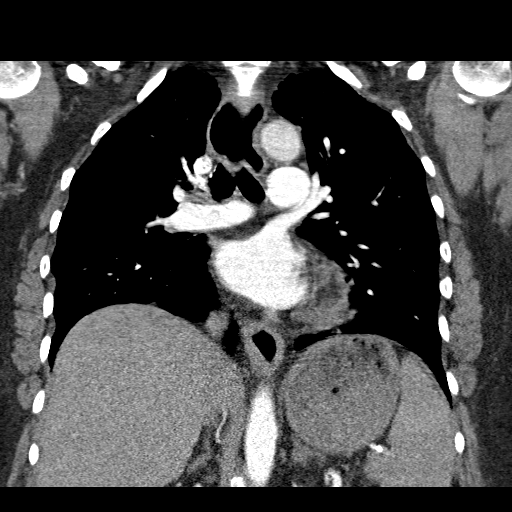
[im 79/105  soft-tissue]
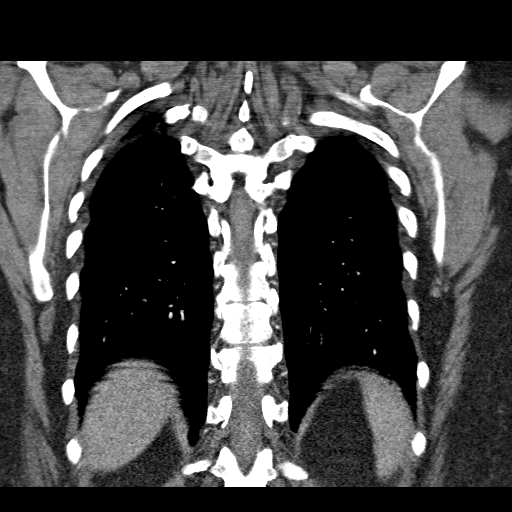

[19 of 46 positions shown; findings below may reference images not displayed]

FINDINGS: Technically adequate study with good opacification of the
central and segmental pulmonary arteries.  No focal filling defects
demonstrated.  No evidence of significant pulmonary embolus.

Normal heart size.  Calcification in the region of the mitral valve
annulus.  Normal caliber thoracic aorta.  Mild aortic
calcification.  No significant lymphadenopathy in the chest.  Gas
filled esophagus suggesting reflux or dysmotility.  No pleural
effusion.  No pneumothorax.  Visualization of the lungs is limited
due to respiratory motion artifact but there is no obvious
consolidation or interstitial change.  Degenerative changes in the
spine.  Focal nodular enlargement of the left thyroid gland,
measuring about 1.3 cm diameter.  Visualized upper abdominal organs
are unremarkable.
IMPRESSION: No evidence of significant pulmonary embolus.

## 2013-07-30 ENCOUNTER — Ambulatory Visit (INDEPENDENT_AMBULATORY_CARE_PROVIDER_SITE_OTHER): Payer: Medicare Other | Admitting: Pharmacist

## 2013-07-30 ENCOUNTER — Encounter: Payer: Self-pay | Admitting: Family Medicine

## 2013-07-30 VITALS — BP 130/70 | HR 72 | Ht 63.0 in | Wt 185.0 lb

## 2013-07-30 DIAGNOSIS — E785 Hyperlipidemia, unspecified: Secondary | ICD-10-CM

## 2013-07-30 DIAGNOSIS — E119 Type 2 diabetes mellitus without complications: Secondary | ICD-10-CM | POA: Diagnosis not present

## 2013-07-30 DIAGNOSIS — I10 Essential (primary) hypertension: Secondary | ICD-10-CM

## 2013-07-30 DIAGNOSIS — Z23 Encounter for immunization: Secondary | ICD-10-CM | POA: Diagnosis not present

## 2013-07-30 NOTE — Progress Notes (Signed)
Diabetes Follow-Up Visit Chief Complaint:   No chief complaint on file.    Filed Vitals:   07/30/13 1044  BP: 130/70  Pulse: 72   Birth weight not on file  HPI: patient has long standing diabetes.  Her most recent A1C was 6.1%.  Over the last 2-3 months we have been adjusting her diabetes medications to try to get less fluctuation in her HBG readings.  On 04/05/2013 she started novolog with meals and metformin XR. Patient stopped metformin XR about 2 weeks ago because she had low energy.  Once she stopped metformin her energy level improved.   Current Diabetes Medications:   Lantus 25 to 37 units daily in the evening, Novolog 7 units tid prior to each meal  Polyuria:  negative  Polydipsia:  negative Polyphagia:  negatvie  BMI:  Body mass index is 32.78 kg/(m^2).   Weight changes:  Has lost a total of 14.5 lbs since 03/23/2013. General Appearance:  alert, oriented, no acute distress and obese Mood/Affect:  normal  Diet - Continues to try to limit CHO intake.  Exercise:  She was walking 2-3 times a week but her walking partner has broken her foot and has not been walking as much lately.   Home BG Monitoring:  Checking 3 to 5 times a day. Average:  115   High: 223 Low:  52 (has had less than 1 hypoglycemic episode weekly which is an improvement over the last 4 months.    Lab Results  Component Value Date   HGBA1C 6.1 07/30/2013    Lab Results  Component Value Date   MICROALBUR 0.4 04/04/2009    Lab Results  Component Value Date   CHOL 128 04/04/2009   HDL 48.20 04/04/2009   LDLCALC 50 03/23/2013   TRIG 73 03/23/2013   CHOLHDL 3 04/04/2009      Assessment: 1.  Diabetes.  Fluctuations in BG continue to improve but has stopped metformin due to possible side effect 2.  Blood Pressure.  controlled 3.  Lipids.  Controlled 4.  Obesity - patient has lost 14.5# since 03/23/2013   Recommendations: 1.  Medication recommendations at this time are as follows:    Continue Lantus  to 37 units daily  Change Novolog to sliding scale   Inject prior to each meal per sliding scale.   BG less than 80 - no insulin   80 to 120 - 4 units   121 to 150 - 6 units   151 to 200 - 7 units   201 to 250 - 8 units   250 or above - 9 units and call office.    2.  Reviewed HBG goals:  Fasting 80-130 and 1-2 hour post prandial <180.  Patient is instructed to check BG 3 to 5 times per day.    3.  BP goal < 140/80. 4.  LDL goal of < 100, HDL > 40 and TG < 150. 5.  Eye Exam yearly and Dental Exam every 6 months. 6.  Return to clinic in 2 weeks to see Dr Modesto Charon.  Appt with clinical pharmacist in November 2014.    Henrene Pastor, PharmD, CPP

## 2013-07-30 NOTE — Patient Instructions (Signed)
nject prior to each meal per sliding scale. BG less than 80 - no insulin 80 to 120 - 4 units 121 to 150 - 6 units 151 to 200 - 7 units 201 to 250 - 8 units 250 or above - 9 units and call office.  [name] [phone]

## 2013-08-09 ENCOUNTER — Ambulatory Visit (INDEPENDENT_AMBULATORY_CARE_PROVIDER_SITE_OTHER): Payer: Medicare Other | Admitting: Family Medicine

## 2013-08-09 ENCOUNTER — Encounter: Payer: Self-pay | Admitting: Family Medicine

## 2013-08-09 VITALS — BP 125/60 | HR 69 | Temp 98.0°F | Ht 63.0 in | Wt 184.2 lb

## 2013-08-09 DIAGNOSIS — IMO0001 Reserved for inherently not codable concepts without codable children: Secondary | ICD-10-CM | POA: Diagnosis not present

## 2013-08-09 DIAGNOSIS — E109 Type 1 diabetes mellitus without complications: Secondary | ICD-10-CM | POA: Diagnosis not present

## 2013-08-09 DIAGNOSIS — R32 Unspecified urinary incontinence: Secondary | ICD-10-CM

## 2013-08-09 DIAGNOSIS — E785 Hyperlipidemia, unspecified: Secondary | ICD-10-CM | POA: Diagnosis not present

## 2013-08-09 DIAGNOSIS — I059 Rheumatic mitral valve disease, unspecified: Secondary | ICD-10-CM

## 2013-08-09 DIAGNOSIS — L439 Lichen planus, unspecified: Secondary | ICD-10-CM

## 2013-08-09 DIAGNOSIS — G4700 Insomnia, unspecified: Secondary | ICD-10-CM

## 2013-08-09 DIAGNOSIS — F3289 Other specified depressive episodes: Secondary | ICD-10-CM

## 2013-08-09 DIAGNOSIS — K589 Irritable bowel syndrome without diarrhea: Secondary | ICD-10-CM

## 2013-08-09 DIAGNOSIS — I10 Essential (primary) hypertension: Secondary | ICD-10-CM

## 2013-08-09 DIAGNOSIS — F329 Major depressive disorder, single episode, unspecified: Secondary | ICD-10-CM

## 2013-08-09 DIAGNOSIS — M810 Age-related osteoporosis without current pathological fracture: Secondary | ICD-10-CM

## 2013-08-09 DIAGNOSIS — K219 Gastro-esophageal reflux disease without esophagitis: Secondary | ICD-10-CM | POA: Diagnosis not present

## 2013-08-09 LAB — POCT UA - MICROALBUMIN: Microalbumin Ur, POC: NEGATIVE mg/L

## 2013-08-09 NOTE — Progress Notes (Signed)
Patient ID: [name], female   DOB: November 15, 1940, 72 y.o.   MRN: [id number] SUBJECTIVE: CC: Chief Complaint  Patient presents with  . Follow-up    3 month states after flu shot she has felt fatigued achy nauseaous     HPI:  Patient is here for follow up of Diabetes Mellitus/HTN/HLD/retinopathy: Symptoms evaluated: Denies Nocturia ,Denies Urinary Frequency , denies Blurred vision ,deniesDizziness,denies.Dysuria,denies paresthesias, denies extremity pain or ulcers.Marland Kitchendenies chest pain. has had an annual eye exam. do check the feet. Does check CBGs. Average WUJ:WJXBJY Denies episodes of hypoglycemia. Does have an emergency hypoglycemic plan. admits toCompliance with medications. Denies Problems with medications.   Past Medical History  Diagnosis Date  . Fibromyalgia   . IBS (irritable bowel syndrome)   . Diabetic retinopathy   . Osteoporosis   . Acute cystitis   . Depression   . Diabetes mellitus type II   . Hyperlipidemia   . Hypertension   . GERD (gastroesophageal reflux disease)   . Insomnia   . Urge incontinence of urine   . IBS (irritable bowel syndrome)   . Postmenopausal   . Lichen planus     vulvar  . DM type 2 causing CKD stage 3    Past Surgical History  Procedure Laterality Date  . Cataract extraction w/ intraocular lens implant  09/2003, 12/2003  . Cholecystectomy  2004  . Tubal ligation  1970  . Ankle surgery  02/2004  . Total knee arthroplasty      Left  . Eye surgery      laser treatments   History   Social History  . Marital Status: Widowed    Spouse Name: N/A    Number of Children: N/A  . Years of Education: N/A   Occupational History  . retired    Social History Main Topics  . Smoking status: Never Smoker   . Smokeless tobacco: Not on file  . Alcohol Use: No  . Drug Use: No  . Sexual Activity: Not on file   Other Topics Concern  . Not on file   Social History Narrative   Widowed in 2010   Family History  Problem Relation Age  of Onset  . Diabetes Other    Current Outpatient Prescriptions on File Prior to Visit  Medication Sig Dispense Refill  . Acetaminophen (TYLENOL PO) Take by mouth as needed.      Marland Kitchen aspirin 81 MG tablet Take 81 mg by mouth daily.        Marland Kitchen atorvastatin (LIPITOR) 20 MG tablet Take 10 mg by mouth at bedtime.       . Calcium Carb-Cholecalciferol (CALCIUM 600+D3) 600-200 MG-UNIT TABS Take 1 tablet by mouth 2 (two) times daily.        Marland Kitchen desonide (DESOWEN) 0.05 % ointment Apply 1 application topically 2 (two) times daily.       . diphenoxylate-atropine (LOMOTIL) 2.5-0.025 MG per tablet Take 1 tablet by mouth 4 (four) times daily as needed for diarrhea or loose stools.      . DULoxetine (CYMBALTA) 60 MG capsule Take 60 mg by mouth daily.       Marland Kitchen esomeprazole (NEXIUM) 40 MG capsule Take 1 capsule (40 mg total) by mouth daily.  90 capsule  3  . estradiol (ESTRACE) 0.1 MG/GM vaginal cream Place 2 g vaginally every Monday, Wednesday, and Friday.       . fish oil-omega-3 fatty acids 1000 MG capsule Take 1 capsule by mouth 2 (two) times daily.       Marland Kitchen  glucose blood (FREESTYLE LITE) test strip Use as instructed to check BG up to qid. Dx: type 2 DM uncontrolled with insulin use  200 each  3  . hyoscyamine (LEVBID) 0.375 MG 12 hr tablet Take 0.375 mg by mouth every 12 (twelve) hours as needed for cramping.      . insulin aspart (NOVOLOG) 100 UNIT/ML injection Inject prior to each meal per sliding scale.BG less than 80 - no insulin; 80 to 120 - 4 units; 121 to 150 - 6 units; 151 to 200 - 7 units; 201 to 250 - 8 units; 250 or above - 9 units and call office.  15 mL  3  . Insulin Glargine (LANTUS SOLOSTAR) 100 UNIT/ML SOPN Inject 37 Units into the skin every evening.  5 pen  1  . Insulin Syringe-Needle U-100 (INSULIN SYRINGE .3CC/31GX5/16") 31G X 5/16" 0.3 ML MISC 1 each by Does not apply route as directed.  200 each  3  . Insulin Syringe-Needle U-100 (INSULIN SYRINGE 1CC/31GX5/16") 31G X 5/16" 1 ML MISC 1 each by  Does not apply route as directed.  200 each  3  . metoprolol succinate (TOPROL-XL) 25 MG 24 hr tablet Take 0.5 tablets (12.5 mg total) by mouth at bedtime.  45 tablet  3  . Multiple Vitamin (MULTIVITAMIN) tablet Take 0.5 tablets by mouth 2 (two) times daily.       . nitrofurantoin (MACRODANTIN) 100 MG capsule Take 100 mg by mouth daily.        . traZODone (DESYREL) 150 MG tablet Take 1 tablet (150 mg total) by mouth at bedtime.  90 tablet  3  . triamterene-hydrochlorothiazide (MAXZIDE-25) 37.5-25 MG per tablet Take by mouth. Take 1/2 tablet daily       No current facility-administered medications on file prior to visit.   Allergies  Allergen Reactions  . Latex Anaphylaxis and Rash  . Amoxicillin   . Celebrex [Celecoxib]   . Fluticasone   . Hydrocodone   . Hydrocodone-Acetaminophen Other (See Comments)    hallucinations  . Hyoscyamine Sulfate   . Morphine     REACTION: hallucinations,GI upset  . Tramadol   . Vicoprofen [Hydrocodone-Ibuprofen]   . Zolpidem Tartrate     REACTION: hallucinations  . Prednisone Rash and Other (See Comments)    Hyperglycemia   . Sulfa Antibiotics Rash   Immunization History  Administered Date(s) Administered  . Influenza,inj,Quad PF,36+ Mos 07/30/2013  . Pneumococcal Polysaccharide 10/22/2009  . Td 10/25/2004  . Zoster 04/06/2012   Prior to Admission medications   Medication Sig Start Date End Date Taking? Authorizing Provider  Acetaminophen (TYLENOL PO) Take by mouth as needed.   Yes Historical Provider, MD  aspirin 81 MG tablet Take 81 mg by mouth daily.     Yes Historical Provider, MD  atorvastatin (LIPITOR) 20 MG tablet Take 10 mg by mouth at bedtime.    Yes Historical Provider, MD  Calcium Carb-Cholecalciferol (CALCIUM 600+D3) 600-200 MG-UNIT TABS Take 1 tablet by mouth 2 (two) times daily.     Yes Historical Provider, MD  desonide (DESOWEN) 0.05 % ointment Apply 1 application topically 2 (two) times daily.    Yes Historical Provider, MD   diphenoxylate-atropine (LOMOTIL) 2.5-0.025 MG per tablet Take 1 tablet by mouth 4 (four) times daily as needed for diarrhea or loose stools.   Yes Historical Provider, MD  DULoxetine (CYMBALTA) 60 MG capsule Take 60 mg by mouth daily.     Yes Historical Provider, MD  esomeprazole (NEXIUM) 40 MG capsule  Take 1 capsule (40 mg total) by mouth daily. 03/23/13  Yes Ileana Ladd, MD  estradiol (ESTRACE) 0.1 MG/GM vaginal cream Place 2 g vaginally every Monday, Wednesday, and Friday.    Yes Historical Provider, MD  fish oil-omega-3 fatty acids 1000 MG capsule Take 1 capsule by mouth 2 (two) times daily.    Yes Historical Provider, MD  glucose blood (FREESTYLE LITE) test strip Use as instructed to check BG up to qid. Dx: type 2 DM uncontrolled with insulin use 04/23/13  Yes Tammy Eckard, PHARMD  hyoscyamine (LEVBID) 0.375 MG 12 hr tablet Take 0.375 mg by mouth every 12 (twelve) hours as needed for cramping.   Yes Historical Provider, MD  insulin aspart (NOVOLOG) 100 UNIT/ML injection Inject prior to each meal per sliding scale.BG less than 80 - no insulin; 80 to 120 - 4 units; 121 to 150 - 6 units; 151 to 200 - 7 units; 201 to 250 - 8 units; 250 or above - 9 units and call office. 07/30/13  Yes Tammy Eckard, PHARMD  Insulin Glargine (LANTUS SOLOSTAR) 100 UNIT/ML SOPN Inject 37 Units into the skin every evening. 06/07/13  Yes Tammy Eckard, PHARMD  Insulin Syringe-Needle U-100 (INSULIN SYRINGE .3CC/31GX5/16") 31G X 5/16" 0.3 ML MISC 1 each by Does not apply route as directed. 04/13/13  Yes Ileana Ladd, MD  Insulin Syringe-Needle U-100 (INSULIN SYRINGE 1CC/31GX5/16") 31G X 5/16" 1 ML MISC 1 each by Does not apply route as directed. 04/13/13  Yes Ileana Ladd, MD  metoprolol succinate (TOPROL-XL) 25 MG 24 hr tablet Take 0.5 tablets (12.5 mg total) by mouth at bedtime. 03/23/13  Yes Ileana Ladd, MD  Multiple Vitamin (MULTIVITAMIN) tablet Take 0.5 tablets by mouth 2 (two) times daily.    Yes Historical Provider,  MD  nitrofurantoin (MACRODANTIN) 100 MG capsule Take 100 mg by mouth daily.     Yes Historical Provider, MD  traZODone (DESYREL) 150 MG tablet Take 1 tablet (150 mg total) by mouth at bedtime. 03/23/13  Yes Ileana Ladd, MD  triamterene-hydrochlorothiazide (MAXZIDE-25) 37.5-25 MG per tablet Take by mouth. Take 1/2 tablet daily   Yes Historical Provider, MD     ROS: As above in the HPI. All other systems are stable or negative.  OBJECTIVE: APPEARANCE:  Patient in no acute distress.The patient appeared well nourished and normally developed. Acyanotic. Waist: VITAL SIGNS:BP 125/60  Pulse 69  Temp(Src) 98 F (36.7 C) (Oral)  Ht 5\' 3"  (1.6 m)  Wt 184 lb 3.2 oz (83.553 kg)  BMI 32.64 kg/m2  WF flat facies. Pleasant  SKIN: warm and  Dry without overt rashes, tattoos and scars  HEAD and Neck: without JVD, Head and scalp: normal Eyes:No scleral icterus. Fundi normal, eye movements normal. Ears: Auricle normal, canal normal, Tympanic membranes normal, insufflation normal. Nose: normal Throat: normal Neck & thyroid: normal  CHEST & LUNGS: Chest wall: normal Lungs: Clear  CVS: Reveals the PMI to be normally located. Regular rhythm, First and Second Heart sounds are normal,  absence of murmurs, rubs or gallops. Peripheral vasculature: Radial pulses: normal Dorsal pedis pulses: normal Posterior pulses: normal  ABDOMEN:  Appearance: obese Benign, no organomegaly, no masses, no Abdominal Aortic enlargement. No Guarding , no rebound. No Bruits. Bowel sounds: normal  RECTAL: N/A GU: N/A  EXTREMETIES: nonedematous.  MUSCULOSKELETAL:  Spine: normal Joints: intact  NEUROLOGIC: oriented to time,place and person; nonfocal. See DM foot exam.  ASSESSMENT: DIABETES MELLITUS, TYPE I - Plan: CMP14+EGFR, POCT UA - Microalbumin  HLD (hyperlipidemia) - Plan: CMP14+EGFR, NMR, lipoprofile  HTN (hypertension) - Plan: CMP14+EGFR  DEPRESSION  FIBROMYALGIA  GERD  (gastroesophageal reflux disease)  Insomnia  Irritable bowel syndrome  Lichen planus  MITRAL VALVE PROLAPSE  UNSPECIFIED OSTEOPOROSIS  URINARY INCONTINENCE  PLAN:  Orders Placed This Encounter  Procedures  . CMP14+EGFR  . NMR, lipoprofile  . POCT UA - Microalbumin   No orders of the defined types were placed in this encounter.   Medications Discontinued During This Encounter  Medication Reason  . metFORMIN (GLUCOPHAGE XR) 500 MG 24 hr tablet Discontinued by provider  . mirabegron ER (MYRBETRIQ) 50 MG TB24 Completed Course  . Probiotic Product (PROBIOTIC DAILY PO) Patient has not taken in last 30 days  . vitamin B-12 (CYANOCOBALAMIN) 100 MCG tablet Patient has not taken in last 30 days   Diet and exercise and praise her on her improvements. Return in about 4 months (around 12/10/2013) for Recheck medical problems.  Arasely Akkerman P. Modesto Charon, M.D.

## 2013-08-11 ENCOUNTER — Other Ambulatory Visit: Payer: Self-pay | Admitting: Family Medicine

## 2013-08-11 DIAGNOSIS — E875 Hyperkalemia: Secondary | ICD-10-CM

## 2013-08-11 LAB — CMP14+EGFR
ALT: 18 IU/L (ref 0–32)
AST: 19 IU/L (ref 0–40)
Albumin/Globulin Ratio: 1.9 (ref 1.1–2.5)
Albumin: 3.8 g/dL (ref 3.5–4.8)
Alkaline Phosphatase: 67 IU/L (ref 39–117)
BUN/Creatinine Ratio: 16 (ref 11–26)
BUN: 17 mg/dL (ref 8–27)
CO2: 30 mmol/L — ABNORMAL HIGH (ref 18–29)
Calcium: 9.3 mg/dL (ref 8.6–10.2)
Chloride: 91 mmol/L — ABNORMAL LOW (ref 97–108)
Creatinine, Ser: 1.07 mg/dL — ABNORMAL HIGH (ref 0.57–1.00)
GFR calc Af Amer: 60 mL/min/{1.73_m2} (ref >59)
GFR calc non Af Amer: 52 mL/min/{1.73_m2} — ABNORMAL LOW (ref >59)
Globulin, Total: 2 g/dL (ref 1.5–4.5)
Glucose: 275 mg/dL — ABNORMAL HIGH (ref 65–99)
Potassium: 4.6 mmol/L (ref 3.5–5.2)
Sodium: 132 mmol/L — ABNORMAL LOW (ref 134–144)
Total Bilirubin: 0.4 mg/dL (ref 0.0–1.2)
Total Protein: 5.8 g/dL — ABNORMAL LOW (ref 6.0–8.5)

## 2013-08-11 LAB — NMR, LIPOPROFILE
Cholesterol: 107 mg/dL (ref <200)
HDL Cholesterol by NMR: 44 mg/dL (ref >=40)
HDL Particle Number: 25.4 umol/L — ABNORMAL LOW (ref >=30.5)
LDL Particle Number: 613 nmol/L (ref <1000)
LDL Size: 20.8 nm (ref >20.5)
LDLC SERPL CALC-MCNC: 45 mg/dL (ref <100)
LP-IR Score: 25 (ref <=45)
Small LDL Particle Number: 209 nmol/L (ref <=527)
Triglycerides by NMR: 90 mg/dL (ref <150)

## 2013-08-11 NOTE — Progress Notes (Signed)
Quick Note:  Call Patient Labs abnormal: Potassium was high and the sugar high but her recent HGBA1C was excellent. I suspect when she saw me she had too much carbs , sugars on board or she had not taken her medications and tis made the sugars high.  Recommendations: Recheck a BMP ASAP.ordered in EPIC.    ______

## 2013-08-20 ENCOUNTER — Other Ambulatory Visit (INDEPENDENT_AMBULATORY_CARE_PROVIDER_SITE_OTHER): Payer: Medicare Other

## 2013-08-20 DIAGNOSIS — E875 Hyperkalemia: Secondary | ICD-10-CM

## 2013-08-21 LAB — BMP8+EGFR
BUN/Creatinine Ratio: 17 (ref 11–26)
BUN: 20 mg/dL (ref 8–27)
CO2: 28 mmol/L (ref 18–29)
Calcium: 9.4 mg/dL (ref 8.6–10.2)
Chloride: 92 mmol/L — ABNORMAL LOW (ref 97–108)
Creatinine, Ser: 1.15 mg/dL — ABNORMAL HIGH (ref 0.57–1.00)
GFR calc Af Amer: 55 mL/min/{1.73_m2} — ABNORMAL LOW (ref >59)
GFR calc non Af Amer: 47 mL/min/{1.73_m2} — ABNORMAL LOW (ref >59)
Glucose: 204 mg/dL — ABNORMAL HIGH (ref 65–99)
Potassium: 4.5 mmol/L (ref 3.5–5.2)
Sodium: 135 mmol/L (ref 134–144)

## 2013-08-23 NOTE — Progress Notes (Signed)
Patient came in for labs only.

## 2013-08-28 DIAGNOSIS — B351 Tinea unguium: Secondary | ICD-10-CM | POA: Diagnosis not present

## 2013-08-28 DIAGNOSIS — E1149 Type 2 diabetes mellitus with other diabetic neurological complication: Secondary | ICD-10-CM | POA: Diagnosis not present

## 2013-08-28 DIAGNOSIS — L851 Acquired keratosis [keratoderma] palmaris et plantaris: Secondary | ICD-10-CM | POA: Diagnosis not present

## 2013-09-10 ENCOUNTER — Ambulatory Visit (INDEPENDENT_AMBULATORY_CARE_PROVIDER_SITE_OTHER): Payer: Medicare Other | Admitting: Pharmacist

## 2013-09-10 VITALS — BP 150/80 | HR 80 | Ht 63.0 in | Wt 181.0 lb

## 2013-09-10 DIAGNOSIS — IMO0001 Reserved for inherently not codable concepts without codable children: Secondary | ICD-10-CM

## 2013-09-10 DIAGNOSIS — E119 Type 2 diabetes mellitus without complications: Secondary | ICD-10-CM | POA: Diagnosis not present

## 2013-09-10 DIAGNOSIS — R5383 Other fatigue: Secondary | ICD-10-CM | POA: Diagnosis not present

## 2013-09-10 DIAGNOSIS — R5381 Other malaise: Secondary | ICD-10-CM

## 2013-09-10 DIAGNOSIS — I10 Essential (primary) hypertension: Secondary | ICD-10-CM | POA: Diagnosis not present

## 2013-09-10 LAB — POCT CBC
Lymph, poc: 2.1 (ref 0.6–3.4)
MCH, POC: 30.8 pg (ref 27–31.2)
MCHC: 33.5 g/dL (ref 31.8–35.4)
MCV: 92 fL (ref 80–97)
MPV: 7 fL (ref 0–99.8)
POC LYMPH PERCENT: 35.9 %L (ref 10–50)
Platelet Count, POC: 166 10*3/uL (ref 142–424)
RDW, POC: 13.1 %
WBC: 5.8 10*3/uL (ref 4.6–10.2)

## 2013-09-10 NOTE — Progress Notes (Signed)
Diabetes Follow-Up Visit Chief Complaint:   No chief complaint on file.    Filed Vitals:   09/10/13 1435  BP: 150/80  Pulse: 80   Filed Weights   09/10/13 1435  Weight: 181 lb (82.101 kg)    HPI:  Diabetes: patient has long standing diabetes.  Her most recent A1C was 6.1%.  Over the last 2-3 months we have been adjusting her diabetes medications to try to get less fluctuation in her HBG readings.  On 04/05/2013 she started novolog with meals and metformin XR. Patient stopped metformin XR about 2 weeks ago because she had low energy.  Once she stopped metformin her energy level improved fir awhile but today she states that she is feeling fatigued.  Last  Current Diabetes Medications:    Lantus to 37 units daily  Novolog to sliding scale   Inject prior to each meal per sliding scale.   BG less than 80 - no insulin   80 to 120 - 4 units   121 to 150 - 6 units   151 to 200 - 7 units   201 to 250 - 8 units   250 or above - 9 units and call office.  Polyuria:  negative  Polydipsia:  negative Polyphagia:  negatvie  BMI:  Body mass index is 32.07 kg/(m^2).   Weight changes:  Has lost a total of 16 lbs since 03/23/2013. General Appearance:  alert, oriented, no acute distress and obese Mood/Affect:  normal  Diet - Continues to try to limit CHO intake.  Exercise:  She was walking 2-3 times a week but her walking partner has broken her foot and has not been walking as much lately.   Home BG Monitoring:  Checking 3 to 5 times a day. Average:  115   High: 223 Low:  56 (has had less than 1 hypoglycemic episode weekly)  Depression - Patient is tearful today as she discusses two family situations that have been causing her stress lately.  She is currently taking trazadone and cymbalta which both should help with depression. She was doing well until recently.  She has been getting out less.  She denies intentions to harm either herself or anyone else.  She has not talked with anyone  about the 2 situations because many members of her family are not aware of the situations.    Lab Results  Component Value Date   HGBA1C 6.1 07/30/2013      Lab Results  Component Value Date   CHOL 107 08/09/2013   HDL 48.20 04/04/2009   LDLCALC 50 03/23/2013   TRIG 73 03/23/2013   CHOLHDL 3 04/04/2009      Assessment: 1.  Diabetes.  Fluctuations in BG continue to improve 2.  Blood Pressure.  controlled 3.  Lipids.  Controlled 4.  Obesity - patient has lost 16# since 03/23/2013 5.  Depression - exaccerbated by current family stress 6. Fatigue   Recommendations: 1.  Medication recommendations at this time are as follows:    Continue Lantus to 37 units daily  Continue Novolog sliding scale   Inject prior to each meal per sliding scale.   BG less than 80 - no insulin   80 to 120 - 4 units   121 to 150 - 6 units   151 to 200 - 7 units   201 to 250 - 8 units   250 or above - 9 units and call office.    2.  Reviewed HBG goals:  Fasting  80-130 and 1-2 hour post prandial <180.  Patient is instructed to check BG 3 to 5 times per day.    3.  BP goal < 140/80. 4.  LDL goal of < 100, HDL > 40 and TG < 150. 5.  Eye Exam yearly and Dental Exam every 6 months. 6.  Patient given list of psychologist and area pastors to contact for counseling. Patient is instructed to call office if depression does not improve or worsens.  7.   Orders Placed This Encounter  Procedures  . Thyroid Panel With TSH  . BMP8+EGFR  . POCT CBC    8.  RTC in 3 months    Henrene Pastor, PharmD, CPP

## 2013-09-11 LAB — BMP8+EGFR
BUN: 21 mg/dL (ref 8–27)
CO2: 30 mmol/L — ABNORMAL HIGH (ref 18–29)
Calcium: 9.6 mg/dL (ref 8.6–10.2)
Creatinine, Ser: 1.14 mg/dL — ABNORMAL HIGH (ref 0.57–1.00)

## 2013-09-11 LAB — THYROID PANEL WITH TSH
Free Thyroxine Index: 1.7 (ref 1.2–4.9)
T3 Uptake Ratio: 32 % (ref 24–39)
T4, Total: 5.3 ug/dL (ref 4.5–12.0)
TSH: 2.1 u[IU]/mL (ref 0.450–4.500)

## 2013-09-15 ENCOUNTER — Encounter: Payer: Self-pay | Admitting: Pharmacist

## 2013-09-19 ENCOUNTER — Telehealth: Payer: Self-pay | Admitting: Pharmacist

## 2013-09-19 ENCOUNTER — Encounter: Payer: Self-pay | Admitting: Pharmacist

## 2013-09-19 NOTE — Telephone Encounter (Signed)
Called patient about labs.  She also reported having a "jumpy" feeling, almost anxious from time to time.  Upon further questioning this seems to have started when she discontinued lexapro.  Instructed [name] to restart lexapro at lower dose of 10mg  daily.  Follow up in 4-6 weeks.

## 2013-11-01 ENCOUNTER — Ambulatory Visit (INDEPENDENT_AMBULATORY_CARE_PROVIDER_SITE_OTHER): Payer: Medicare Other | Admitting: Pharmacist

## 2013-11-01 VITALS — BP 133/74 | HR 78 | Ht 63.0 in | Wt 182.2 lb

## 2013-11-01 DIAGNOSIS — Z0000 Encounter for general adult medical examination without abnormal findings: Secondary | ICD-10-CM | POA: Diagnosis not present

## 2013-11-01 MED ORDER — GLUCOSE BLOOD VI STRP: ORAL_STRIP | Status: DC

## 2013-11-01 NOTE — Progress Notes (Signed)
Subjective:    [name] is a 73 y.o. female who presents for Medicare Initial preventive examination.  Preventive Screening-Counseling & Management  Tobacco History  Smoking status  . Never Smoker   Smokeless tobacco  . Never Used     Current Problems (verified) Patient Active Problem List   Diagnosis Date Noted  . GERD (gastroesophageal reflux disease) 03/23/2013  . HTN (hypertension) 03/23/2013  . Diabetes 03/23/2013  . Insomnia 03/23/2013  . Other malaise and fatigue 03/23/2013  . Depression 03/23/2013  . HLD (hyperlipidemia) 03/23/2013  . Chest pain 05/26/2012  . HYPERPOTASSEMIA 07/17/2009  . MITRAL VALVE PROLAPSE 07/17/2009  . LICHEN PLANUS 29/56/2130  . FIBROMYALGIA 07/17/2009  . HYPOKALEMIA 05/09/2009  . VAGINITIS 04/04/2009  . INSOMNIA 04/04/2009  . URINARY INCONTINENCE 03/14/2009  . CHRONIC ULCER OF OTHER SPECIFIED SITE 12/24/2008  . IRRITABLE BOWEL SYNDROME 08/29/2008  . BACK PAIN, LUMBAR 07/17/2008  . ACUTE CYSTITIS 01/24/2008  . UNSPECIFIED OSTEOPOROSIS 01/10/2008  . DIABETES MELLITUS, TYPE I 05/22/2007  . HYPERLIPIDEMIA 05/22/2007  . DEPRESSION 05/22/2007  . HYPERTENSION 05/22/2007    Medications Prior to Visit Current Outpatient Prescriptions on File Prior to Visit  Medication Sig Dispense Refill  . Acetaminophen (TYLENOL PO) Take by mouth as needed.      Marland Kitchen aspirin 81 MG tablet Take 81 mg by mouth daily.        Marland Kitchen atorvastatin (LIPITOR) 20 MG tablet Take 10 mg by mouth at bedtime.       . Calcium Carb-Cholecalciferol (CALCIUM 600+D3) 600-200 MG-UNIT TABS Take 1 tablet by mouth 2 (two) times daily.        Marland Kitchen desonide (DESOWEN) 0.05 % ointment Apply 1 application topically 2 (two) times daily.       . diphenoxylate-atropine (LOMOTIL) 2.5-0.025 MG per tablet Take 1 tablet by mouth 4 (four) times daily as needed for diarrhea or loose stools.      . DULoxetine (CYMBALTA) 60 MG capsule Take 60 mg by mouth daily.       Marland Kitchen escitalopram (LEXAPRO) 20 MG  tablet Take 10 mg by mouth daily.      Marland Kitchen esomeprazole (NEXIUM) 40 MG capsule Take 1 capsule (40 mg total) by mouth daily.  90 capsule  3  . estradiol (ESTRACE) 0.1 MG/GM vaginal cream Place 2 g vaginally every Monday, Wednesday, and Friday.       . fish oil-omega-3 fatty acids 1000 MG capsule Take 1 capsule by mouth 2 (two) times daily.       Marland Kitchen glucose blood (FREESTYLE LITE) test strip Use as instructed to check BG up to qid. Dx: type 2 DM uncontrolled with insulin use  200 each  3  . hyoscyamine (LEVBID) 0.375 MG 12 hr tablet Take 0.375 mg by mouth every 12 (twelve) hours as needed for cramping.      . insulin aspart (NOVOLOG) 100 UNIT/ML injection Inject prior to each meal per sliding scale.BG less than 80 - no insulin; 80 to 120 - 4 units; 121 to 150 - 6 units; 151 to 200 - 7 units; 201 to 250 - 8 units; 250 or above - 9 units and call office.  15 mL  3  . Insulin Glargine (LANTUS SOLOSTAR) 100 UNIT/ML SOPN Inject 37 Units into the skin every evening.  5 pen  1  . Insulin Syringe-Needle U-100 (INSULIN SYRINGE .3CC/31GX5/16") 31G X 5/16" 0.3 ML MISC 1 each by Does not apply route as directed.  200 each  3  . Insulin Syringe-Needle  U-100 (INSULIN SYRINGE 1CC/31GX5/16") 31G X 5/16" 1 ML MISC 1 each by Does not apply route as directed.  200 each  3  . metoprolol succinate (TOPROL-XL) 25 MG 24 hr tablet Take 0.5 tablets (12.5 mg total) by mouth at bedtime.  45 tablet  3  . Multiple Vitamin (MULTIVITAMIN) tablet Take 0.5 tablets by mouth 2 (two) times daily.       . nitrofurantoin (MACRODANTIN) 100 MG capsule Take 100 mg by mouth daily.        . traZODone (DESYREL) 150 MG tablet Take 1 tablet (150 mg total) by mouth at bedtime.  90 tablet  3  . triamterene-hydrochlorothiazide (MAXZIDE-25) 37.5-25 MG per tablet Take by mouth. Take 1/2 tablet daily       No current facility-administered medications on file prior to visit.    Current Medications (verified) Current Outpatient Prescriptions  Medication  Sig Dispense Refill  . Acetaminophen (TYLENOL PO) Take by mouth as needed.      Marland Kitchen aspirin 81 MG tablet Take 81 mg by mouth daily.        Marland Kitchen atorvastatin (LIPITOR) 20 MG tablet Take 10 mg by mouth at bedtime.       . Calcium Carb-Cholecalciferol (CALCIUM 600+D3) 600-200 MG-UNIT TABS Take 1 tablet by mouth 2 (two) times daily.        Marland Kitchen desonide (DESOWEN) 0.05 % ointment Apply 1 application topically 2 (two) times daily.       . diphenoxylate-atropine (LOMOTIL) 2.5-0.025 MG per tablet Take 1 tablet by mouth 4 (four) times daily as needed for diarrhea or loose stools.      . DULoxetine (CYMBALTA) 60 MG capsule Take 60 mg by mouth daily.       Marland Kitchen escitalopram (LEXAPRO) 20 MG tablet Take 10 mg by mouth daily.      Marland Kitchen esomeprazole (NEXIUM) 40 MG capsule Take 1 capsule (40 mg total) by mouth daily.  90 capsule  3  . estradiol (ESTRACE) 0.1 MG/GM vaginal cream Place 2 g vaginally every Monday, Wednesday, and Friday.       . fish oil-omega-3 fatty acids 1000 MG capsule Take 1 capsule by mouth 2 (two) times daily.       Marland Kitchen glucose blood (FREESTYLE LITE) test strip Use as instructed to check BG up to qid. Dx: type 2 DM uncontrolled with insulin use  200 each  3  . hyoscyamine (LEVBID) 0.375 MG 12 hr tablet Take 0.375 mg by mouth every 12 (twelve) hours as needed for cramping.      . insulin aspart (NOVOLOG) 100 UNIT/ML injection Inject prior to each meal per sliding scale.BG less than 80 - no insulin; 80 to 120 - 4 units; 121 to 150 - 6 units; 151 to 200 - 7 units; 201 to 250 - 8 units; 250 or above - 9 units and call office.  15 mL  3  . Insulin Glargine (LANTUS SOLOSTAR) 100 UNIT/ML SOPN Inject 37 Units into the skin every evening.  5 pen  1  . Insulin Syringe-Needle U-100 (INSULIN SYRINGE .3CC/31GX5/16") 31G X 5/16" 0.3 ML MISC 1 each by Does not apply route as directed.  200 each  3  . Insulin Syringe-Needle U-100 (INSULIN SYRINGE 1CC/31GX5/16") 31G X 5/16" 1 ML MISC 1 each by Does not apply route as directed.   200 each  3  . metoprolol succinate (TOPROL-XL) 25 MG 24 hr tablet Take 0.5 tablets (12.5 mg total) by mouth at bedtime.  45 tablet  3  .  Multiple Vitamin (MULTIVITAMIN) tablet Take 0.5 tablets by mouth 2 (two) times daily.       . nitrofurantoin (MACRODANTIN) 100 MG capsule Take 100 mg by mouth daily.        . traZODone (DESYREL) 150 MG tablet Take 1 tablet (150 mg total) by mouth at bedtime.  90 tablet  3  . triamterene-hydrochlorothiazide (MAXZIDE-25) 37.5-25 MG per tablet Take by mouth. Take 1/2 tablet daily       No current facility-administered medications for this visit.     Allergies (verified) Latex; Amoxicillin; Celebrex; Fluticasone; Hydrocodone; Hydrocodone-acetaminophen; Hyoscyamine sulfate; Morphine; Tramadol; Vicoprofen; Zolpidem tartrate; Prednisone; and Sulfa antibiotics   PAST HISTORY  Family History Family History  Problem Relation Age of Onset  . Diabetes Mother   . Hypertension Mother   . Kidney disease Mother   . Heart disease Mother   . Cancer Father     prostate  . Diabetes Father   . Heart disease Father     CABG  . Hypertension Father   . Depression Sister   . Hypertension Sister     Social History History  Substance Use Topics  . Smoking status: Never Smoker   . Smokeless tobacco: Never Used  . Alcohol Use: No     Are there smokers in your home (other than you)? No  Risk Factors Current exercise habits: Exercise is limited by orthopedic condition(s): bulging disc - she has contact [facility] to make appt to discuss possible treatments..  Dietary issues discussed: limiting high CHO foods   Cardiac risk factors: advanced age (older than 35 for men, 68 for women), diabetes mellitus, dyslipidemia, family history of premature cardiovascular disease, hypertension, obesity (BMI >= 30 kg/m2) and sedentary lifestyle.  Depression Screen (Note: if answer to either of the following is "Yes", a more complete depression screening is indicated)    Over the past 2 weeks, have you felt down, depressed or hopeless? No  Over the past 2 weeks, have you felt little interest or pleasure in doing things? No  Have you lost interest or pleasure in daily life? No  Do you often feel hopeless? No  Do you cry easily over simple problems? No  Activities of Daily Living In your present state of health, do you have any difficulty performing the following activities?:  Driving? No - only drives in [place]?  No Feeding yourself? No Getting from bed to chair? No Climbing a flight of stairs? Yes - gets a little SOB and has to go slowly due to knee (history of left knee replacement) Preparing food and eating?: No Bathing or showering? No Getting dressed: No Getting to the toilet? No Using the toilet:No Moving around from place to place: No In the past year have you fallen or had a near fall?:No   Are you sexually active?  No  Do you have more than one partner?  No  Hearing Difficulties: Yes - patient reports that she has been 2 audiologist and has hearing aids but has not been happy with results from either visit. Do you often ask people to speak up or repeat themselves? Yes Do you experience ringing or noises in your ears? No Do you have difficulty understanding soft or whispered voices? Yes   Do you feel that you have a problem with memory? No  Do you often misplace items? No  Do you feel safe at home?  Yes  Cognitive Testing  Alert? Yes  Normal Appearance?Yes  Oriented to person?  Yes  Place? Yes   Time? Yes  Recall of three objects?  Yes  Can perform simple calculations? Yes  Displays appropriate judgment?Yes  Can read the correct time from a watch face?Yes   Advanced Directives have been discussed with the patient? Yes  List the Names of Other Physician/Practitioners you currently use: 1.  Dr. Arnoldo Morale - optomatrist - [phone] 2.  Dr Shellia Carwin - orthopedist - [phone] 3.  Dr Teena Irani - GI -  [phone] 4.  Dr. Paula Compton - GYN - [phone] 5.  Dr Rolm Bookbinder - Dermatologist - [phone] 6.  Dr Daniel Nones - dermatologist - [phone] 7.  Dr Bjorn Loser - urologist - [phone] 8.  Dr. Jon Billings - cardiologist - [phone] 9.  Dr Steffanie Rainwater - podiatrist   Indicate any recent Medical Services you may have received from other than [facility] providers in the past year (date may be approximate).  Immunization History  Administered Date(s) Administered  . Influenza,inj,Quad PF,36+ Mos 07/30/2013  . Pneumococcal Polysaccharide-23 10/22/2009  . Td 10/25/2004  . Zoster 04/06/2012    Screening Tests Health Maintenance  Topic Date Due  . Hemoglobin A1c  01/28/2014  . Ophthalmology Exam  02/06/2014  . Influenza Vaccine  05/25/2014  . Foot Exam  08/09/2014  . Urine Microalbumin  08/09/2014  . Tetanus/tdap  10/25/2014  . Mammogram  03/06/2015  . Colonoscopy  12/24/2019  . Pneumococcal Polysaccharide Vaccine Age 50 And Over  Completed  . Zostavax  Completed   Current Insulin dose:  Lantus to 37 units daily    Novolog sliding scale  Inject prior to each meal per sliding scale.  BG less than 80 - no insulin  80 to 120 - 4 units  121 to 150 - 6 units  151 to 200 - 7 units  201 to 250 - 8 units  250 or above - 9 units and call office.   All answers were reviewed with the patient and necessary referrals were made:  [facility] Pharmacist   11/01/2013   History reviewed: allergies, current medications, past family history, past medical history, past social history, past surgical history and problem list   Objective:       Body mass index is 32.29 kg/(m^2). Ht 5\' 3"  (1.6 m)  Wt 182 lb 4 oz (82.668 kg)  BMI 32.29 kg/m2      Assessment:     Medicare Annual Wellness Visit     Plan:     During the course of the visit the patient was educated and counseled about appropriate screening and preventive services including:    Pneumococcal vaccine    Influenza vaccine  Td vaccine  Screening mammography  Screening Pap smear and pelvic exam   Bone densitometry screening  Colorectal cancer screening  Diabetes screening  Glaucoma screening  Decrease Lantus to 32 units daily   Increase Novolog sliding scale   Inject prior to each meal per sliding scale.  BG less than 80 - no insulin  80 to 120 - 5 units  121 to 150 - 6 units  151 to 200 - 7 units  201 to 250 - 9 units  250 or above - 10 units and call office.    Patient Instructions (the written plan) was given to the patient.  Medicare Attestation I have personally reviewed: The patient's medical and social history Their use of alcohol, tobacco or illicit drugs Their current medications and supplements The patient's functional ability including ADLs,fall risks, home safety risks,  cognitive, and hearing and visual impairment Diet and physical activities Evidence for depression or mood disorders  The patient's weight, height, BMI, and visual acuity have been recorded in the chart.  I have made referrals, counseling, and provided education to the patient based on review of the above and I have provided the patient with a written personalized care plan for preventive services.     [facility] Pharmacist   11/01/2013

## 2013-11-01 NOTE — Patient Instructions (Addendum)
Health Maintenance Summary    HEMOGLOBIN A1C Next Due 01/28/2014   Up To Date    OPHTHALMOLOGY EXAM Next Due 02/06/2014  Up to Date     INFLUENZA VACCINE Next Due 05/25/2014  Up to Date     FOOT EXAM Next Due 08/09/2014  Up to Date     URINE MICROALBUMIN Next Due 08/09/2014  Up to Date     TETANUS/TDAP Next Due 10/25/2014  Up to Date     MAMMOGRAM Next Due 03/06/2015  Up to Date     COLONOSCOPY Next Due 12/24/2019  Up to Date    Dexa was last done 02/15/2012 - next due 02/14/2014   Glaucoma Screening done 01/2013   Pneumonia Vaccine - completed  Shingles / Zostavax Vaccine - completed   Decrease Lantus to 32 units daily   Increase Novolog sliding scale   Inject prior to each meal per sliding scale.  BG less than 80 - no insulin  80 to 120 - 5 units  121 to 150 - 6 units  151 to 200 - 7 units  201 to 250 - 9 units  250 or above - 10 units and call office.      Preventive Care for Adults, Female A healthy lifestyle and preventive care can promote health and wellness. Preventive health guidelines for women include the following key practices.  A routine yearly physical is a good way to check with your caregiver about your health and preventive screening. It is a chance to share any concerns and updates on your health, and to receive a thorough exam.  Visit your dentist for a routine exam and preventive care every 6 months. Brush your teeth twice a day and floss once a day. Good oral hygiene prevents tooth decay and gum disease.  The frequency of eye exams is based on your age, health, family medical history, use of contact lenses, and other factors. Follow your caregiver's recommendations for frequency of eye exams.  Eat a healthy diet. Foods like vegetables, fruits, whole grains, low-fat dairy products, and lean protein foods contain the nutrients you need without too many calories. Decrease your intake of foods high in solid fats, added sugars, and salt. Eat the right amount of  calories for you.Get information about a proper diet from your caregiver, if necessary.  Regular physical exercise is one of the most important things you can do for your health. Most adults should get at least 150 minutes of moderate-intensity exercise (any activity that increases your heart rate and causes you to sweat) each week. In addition, most adults need muscle-strengthening exercises on 2 or more days a week.  Maintain a healthy weight. The body mass index (BMI) is a screening tool to identify possible weight problems. It provides an estimate of body fat based on height and weight. Your caregiver can help determine your BMI, and can help you achieve or maintain a healthy weight.For adults 20 years and older:  A BMI below 18.5 is considered underweight.  A BMI of 18.5 to 24.9 is normal.  A BMI of 25 to 29.9 is considered overweight.  A BMI of 30 and above is considered obese.  Maintain normal blood lipids and cholesterol levels by exercising and minimizing your intake of saturated fat. Eat a balanced diet with plenty of fruit and vegetables. Blood tests for lipids and cholesterol should begin at age 37 and be repeated every 5 years. If your lipid or cholesterol levels are high, you are over 50,  or you are at high risk for heart disease, you may need your cholesterol levels checked more frequently.Ongoing high lipid and cholesterol levels should be treated with medicines if diet and exercise are not effective.  Lung cancer screening is recommended for adults aged 86 80 years who are at high risk for developing lung cancer because of a history of smoking. Yearly low-dose computed tomography (CT) is recommended for people who have at least a 30-pack-year history of smoking and are a current smoker or have quit within the past 15 years. A pack year of smoking is smoking an average of 1 pack of cigarettes a day for 1 year (for example: 1 pack a day for 30 years or 2 packs a day for 15 years).  Yearly screening should continue until the smoker has stopped smoking for at least 15 years. Yearly screening should also be stopped for people who develop a health problem that would prevent them from having lung cancer treatment.  High blood pressure causes heart disease and increases the risk of stroke. Your blood pressure should be checked at least every 1 to 2 years. Ongoing high blood pressure should be treated with medicines if weight loss and exercise are not effective.  If you are 3 to 74 years old, ask your caregiver if you should take aspirin to prevent strokes.  Diabetes screening involves taking a blood sample to check your fasting blood sugar level. This should be done once every 3 years, after age 48, if you are within normal weight and without risk factors for diabetes. Testing should be considered at a younger age or be carried out more frequently if you are overweight and have at least 1 risk factor for diabetes.  Breast cancer screening is essential preventive care for women. You should practice "breast self-awareness." This means understanding the normal appearance and feel of your breasts and may include breast self-examination. Any changes detected, no matter how small, should be reported to a caregiver. Women in their 62s and 30s should have a clinical breast exam (CBE) by a caregiver as part of a regular health exam every 1 to 3 years. After age 75, women should have a CBE every year. Starting at age 97, women should consider having a mammography (breast X-ray test) every year. Women who have a family history of breast cancer should talk to their caregiver about genetic screening. Women at a high risk of breast cancer should talk to their caregivers about having magnetic resonance imaging (MRI) and a mammography every year.  The Pap test is a screening test for cervical cancer. A Pap test can show cell changes on the cervix that might become cervical cancer if left untreated. A Pap  test is a procedure in which cells are obtained and examined from the lower end of the uterus (cervix).  Women should have a Pap test starting at age 13.  Between ages 44 and 38, Pap tests should be repeated every 2 years.  Beginning at age 90, you should have a Pap test every 3 years as long as the past 3 Pap tests have been normal.  Some women have medical problems that increase the chance of getting cervical cancer. Talk to your caregiver about these problems. It is especially important to talk to your caregiver if a new problem develops soon after your last Pap test. In these cases, your caregiver may recommend more frequent screening and Pap tests.  The above recommendations are the same for women who have or  have not gotten the vaccine for human papillomavirus (HPV).  If you had a hysterectomy for a problem that was not cancer or a condition that could lead to cancer, then you no longer need Pap tests. Even if you no longer need a Pap test, a regular exam is a good idea to make sure no other problems are starting.  If you are between ages 25 and 69, and you have had normal Pap tests going back 10 years, you no longer need Pap tests. Even if you no longer need a Pap test, a regular exam is a good idea to make sure no other problems are starting.  If you have had past treatment for cervical cancer or a condition that could lead to cancer, you need Pap tests and screening for cancer for at least 20 years after your treatment.  If Pap tests have been discontinued, risk factors (such as a new sexual partner) need to be reassessed to determine if screening should be resumed.  Colorectal cancer can be detected and often prevented. Most routine colorectal cancer screening begins at the age of 69 and continues through age 25. However, your caregiver may recommend screening at an earlier age if you have risk factors for colon cancer. On a yearly basis, your caregiver may provide home test kits to  check for hidden blood in the stool. Use of a small camera at the end of a tube, to directly examine the colon (sigmoidoscopy or colonoscopy), can detect the earliest forms of colorectal cancer. Talk to your caregiver about this at age 24, when routine screening begins. Direct examination of the colon should be repeated every 5 to 10 years through age 17, unless early forms of pre-cancerous polyps or small growths are found.  Hepatitis C blood testing is recommended for all people born from [birth year] through 1966 and any individual with known risks for hepatitis C.  Practice safe sex. Use condoms and avoid high-risk sexual practices to reduce the spread of sexually transmitted infections (STIs). STIs include gonorrhea, chlamydia, syphilis, trichomonas, herpes, HPV, and human immunodeficiency virus (HIV). Herpes, HIV, and HPV are viral illnesses that have no cure. They can result in disability, cancer, and death. Sexually active women aged 29 and younger should be checked for chlamydia. Older women with new or multiple partners should also be tested for chlamydia. Testing for other STIs is recommended if you are sexually active and at increased risk.  Osteoporosis is a disease in which the bones lose minerals and strength with aging. This can result in serious bone fractures. The risk of osteoporosis can be identified using a bone density scan. Women ages 49 and over and women at risk for fractures or osteoporosis should discuss screening with their caregivers. Ask your caregiver whether you should take a calcium supplement or vitamin D to reduce the rate of osteoporosis.  Menopause can be associated with physical symptoms and risks. Hormone replacement therapy is available to decrease symptoms and risks. You should talk to your caregiver about whether hormone replacement therapy is right for you.  Use sunscreen. Apply sunscreen liberally and repeatedly throughout the day. You should seek shade when your shadow  is shorter than you. Protect yourself by wearing long sleeves, pants, a wide-brimmed hat, and sunglasses year round, whenever you are outdoors.  Once a month, do a whole body skin exam, using a mirror to look at the skin on your back. Notify your caregiver of new moles, moles that have irregular borders, moles that are  larger than a pencil eraser, or moles that have changed in shape or color.  Stay current with required immunizations.  Influenza vaccine. All adults should be immunized every year.  Tetanus, diphtheria, and acellular pertussis (Td, Tdap) vaccine. Pregnant women should receive 1 dose of Tdap vaccine during each pregnancy. The dose should be obtained regardless of the length of time since the last dose. Immunization is preferred during the 27th to 36th week of gestation. An adult who has not previously received Tdap or who does not know her vaccine status should receive 1 dose of Tdap. This initial dose should be followed by tetanus and diphtheria toxoids (Td) booster doses every 10 years. Adults with an unknown or incomplete history of completing a 3-dose immunization series with Td-containing vaccines should begin or complete a primary immunization series including a Tdap dose. Adults should receive a Td booster every 10 years.  Zoster vaccine. One dose is recommended for adults aged 75 years or older unless certain conditions are present.  Pneumococcal 13-valent conjugate (PCV13) vaccine. When indicated, a person who is uncertain of her immunization history and has no record of immunization should receive the PCV13 vaccine. An adult aged 42 years or older who has certain medical conditions and has not been previously immunized should receive 1 dose of PCV13 vaccine. This PCV13 should be followed with a dose of pneumococcal polysaccharide (PPSV23) vaccine. The PPSV23 vaccine dose should be obtained at least 8 weeks after the dose of PCV13 vaccine. An adult aged 68 years or older who has  certain medical conditions and previously received 1 or more doses of PPSV23 vaccine should receive 1 dose of PCV13. The PCV13 vaccine dose should be obtained 1 or more years after the last PPSV23 vaccine dose.  Pneumococcal polysaccharide (PPSV23) vaccine. When PCV13 is also indicated, PCV13 should be obtained first. All adults aged 42 years and older should be immunized. An adult younger than age 23 years who has certain medical conditions should be immunized. Any person who resides in a nursing home or long-term care facility should be immunized. An adult smoker should be immunized. People with an immunocompromised condition and certain other conditions should receive both PCV13 and PPSV23 vaccines. People with human immunodeficiency virus (HIV) infection should be immunized as soon as possible after diagnosis. Immunization during chemotherapy or radiation therapy should be avoided. Routine use of PPSV23 vaccine is not recommended for American Indians, [place] Natives, or people younger than 65 years unless there are medical conditions that require PPSV23 vaccine. When indicated, people who have unknown immunization and have no record of immunization should receive PPSV23 vaccine. One-time revaccination 5 years after the first dose of PPSV23 is recommended for people aged 87 64 years who have chronic kidney failure, nephrotic syndrome, asplenia, or immunocompromised conditions. People who received 1 2 doses of PPSV23 before age 58 years should receive another dose of PPSV23 vaccine at age 92 years or later if at least 5 years have passed since the previous dose. Doses of PPSV23 are not needed for people immunized with PPSV23 at or after age 23 years.  Hepatitis A vaccine. Adults who wish to be protected from this disease, have certain high-risk conditions, work with hepatitis A-infected animals, work in hepatitis A research labs, or travel to or work in countries with a high rate of hepatitis A should be  immunized. Adults who were previously unvaccinated and who anticipate close contact with an international adoptee during the first 60 days after arrival in the [place] from a country with a high rate of hepatitis A should be immunized.  Hepatitis B vaccine. Adults who wish to be protected from this disease, have certain high-risk conditions, may be exposed to blood or other infectious body fluids, are household contacts or sex partners of hepatitis B positive people, are clients or workers in certain care facilities, or travel to or work in countries with a high rate of hepatitis B should be immunized.

## 2013-11-13 DIAGNOSIS — M5137 Other intervertebral disc degeneration, lumbosacral region: Secondary | ICD-10-CM | POA: Diagnosis not present

## 2013-11-13 DIAGNOSIS — M545 Low back pain, unspecified: Secondary | ICD-10-CM | POA: Diagnosis not present

## 2013-11-13 DIAGNOSIS — M5126 Other intervertebral disc displacement, lumbar region: Secondary | ICD-10-CM | POA: Diagnosis not present

## 2013-11-15 ENCOUNTER — Telehealth: Payer: Self-pay | Admitting: Pharmacist

## 2013-11-15 NOTE — Telephone Encounter (Signed)
Reviewed medications and new medications added to medication list.  She is c/o of some nausea - suggested to take 1/2 oxycodone / APAP and also take with food to see if that helps.

## 2013-11-28 ENCOUNTER — Telehealth: Payer: Self-pay | Admitting: Family Medicine

## 2013-11-28 DIAGNOSIS — M5137 Other intervertebral disc degeneration, lumbosacral region: Secondary | ICD-10-CM | POA: Diagnosis not present

## 2013-11-28 DIAGNOSIS — M5126 Other intervertebral disc displacement, lumbar region: Secondary | ICD-10-CM | POA: Diagnosis not present

## 2013-11-28 NOTE — Telephone Encounter (Signed)
Recommend if she gets a steroid injection that she check BG QID and to call me if she had BG greater 200 that she call office.  Patient called.

## 2013-11-29 ENCOUNTER — Telehealth: Payer: Self-pay | Admitting: Family Medicine

## 2013-11-29 NOTE — Telephone Encounter (Signed)
Started prednisone 6 day pack yesterday.  This am BG was 324, mid morning was 268, before lunch 291. Increase Lantus to 42 unit at night while taking prednisone.  If BG doesn't start to decrease tomorrow then call office.

## 2013-12-06 ENCOUNTER — Ambulatory Visit (INDEPENDENT_AMBULATORY_CARE_PROVIDER_SITE_OTHER): Payer: Medicare Other | Admitting: Family Medicine

## 2013-12-06 ENCOUNTER — Encounter: Payer: Self-pay | Admitting: Family Medicine

## 2013-12-06 VITALS — BP 113/61 | HR 68 | Temp 98.3°F | Ht 63.0 in | Wt 183.8 lb

## 2013-12-06 DIAGNOSIS — E109 Type 1 diabetes mellitus without complications: Secondary | ICD-10-CM

## 2013-12-06 DIAGNOSIS — F329 Major depressive disorder, single episode, unspecified: Secondary | ICD-10-CM

## 2013-12-06 DIAGNOSIS — I10 Essential (primary) hypertension: Secondary | ICD-10-CM

## 2013-12-06 DIAGNOSIS — K589 Irritable bowel syndrome without diarrhea: Secondary | ICD-10-CM

## 2013-12-06 DIAGNOSIS — E785 Hyperlipidemia, unspecified: Secondary | ICD-10-CM

## 2013-12-06 DIAGNOSIS — F3289 Other specified depressive episodes: Secondary | ICD-10-CM | POA: Diagnosis not present

## 2013-12-06 DIAGNOSIS — I059 Rheumatic mitral valve disease, unspecified: Secondary | ICD-10-CM

## 2013-12-06 DIAGNOSIS — F32A Depression, unspecified: Secondary | ICD-10-CM

## 2013-12-06 DIAGNOSIS — R32 Unspecified urinary incontinence: Secondary | ICD-10-CM

## 2013-12-06 DIAGNOSIS — IMO0001 Reserved for inherently not codable concepts without codable children: Secondary | ICD-10-CM

## 2013-12-06 DIAGNOSIS — M5137 Other intervertebral disc degeneration, lumbosacral region: Secondary | ICD-10-CM | POA: Diagnosis not present

## 2013-12-06 DIAGNOSIS — M810 Age-related osteoporosis without current pathological fracture: Secondary | ICD-10-CM

## 2013-12-06 DIAGNOSIS — L439 Lichen planus, unspecified: Secondary | ICD-10-CM

## 2013-12-06 MED ORDER — ESCITALOPRAM OXALATE 20 MG PO TABS: 10.0000 mg | ORAL_TABLET | Freq: Every day | ORAL | Status: DC

## 2013-12-06 MED ORDER — TRIAMTERENE-HCTZ 37.5-25 MG PO TABS: 0.5000 | ORAL_TABLET | Freq: Every day | ORAL | Status: DC

## 2013-12-06 NOTE — Progress Notes (Signed)
Patient ID: [name], female   DOB: June 11, 1941, 73 y.o.   MRN: [id number] SUBJECTIVE: CC: Chief Complaint  Patient presents with  . Follow-up    4 MONTH FOLLOW UP  REFILL TRAIMATERTERE AND LEXAPRO  90 DAY  WANTS WRITTEN RX  WANTS TO KNOW IF SHOULD SWITCH NEXIUM AND SHOULD SHE CONTINUE THE ASA    HPI:  Patient is here for follow up of Diabetes Mellitus/htn/hld: Symptoms evaluated: Denies Nocturia ,Denies Urinary Frequency , denies Blurred vision ,deniesDizziness,denies.Dysuria,denies paresthesias, denies extremity pain or ulcers.Marland Kitchendenies chest pain. has had an annual eye exam. do check the feet. Does check CBGs. Average CBG:has fluctuated  Denies episodes of hypoglycemia. Does have an emergency hypoglycemic plan. admits toCompliance with medications. Denies Problems with medications.   Past Medical History  Diagnosis Date  . Fibromyalgia   . IBS (irritable bowel syndrome)   . Diabetic retinopathy   . Osteoporosis   . Acute cystitis   . Depression   . Diabetes mellitus type II   . Hyperlipidemia   . Hypertension   . GERD (gastroesophageal reflux disease)   . Insomnia   . Urge incontinence of urine   . IBS (irritable bowel syndrome)   . Postmenopausal   . Lichen planus     vulvar  . DM type 2 causing CKD stage 3    Past Surgical History  Procedure Laterality Date  . Cataract extraction w/ intraocular lens implant  09/2003, 12/2003  . Cholecystectomy  2004  . Tubal ligation  1970  . Ankle surgery  02/2004  . Total knee arthroplasty      Left  . Eye surgery      laser treatments  . Trigger finger release Left 01/30/2002   History   Social History  . Marital Status: Widowed    Spouse Name: N/A    Number of Children: N/A  . Years of Education: N/A   Occupational History  . retired    Social History Main Topics  . Smoking status: Never Smoker   . Smokeless tobacco: Never Used  . Alcohol Use: No  . Drug Use: No  . Sexual Activity: No   Other Topics  Concern  . Not on file   Social History Narrative   Widowed in 2010   Family History  Problem Relation Age of Onset  . Diabetes Mother   . Hypertension Mother   . Kidney disease Mother   . Heart disease Mother   . Cancer Father     prostate  . Diabetes Father   . Heart disease Father     CABG  . Hypertension Father   . Depression Sister   . Hypertension Sister    Current Outpatient Prescriptions on File Prior to Visit  Medication Sig Dispense Refill  . Acetaminophen (TYLENOL PO) Take by mouth as needed.      Marland Kitchen aspirin 81 MG tablet Take 81 mg by mouth daily.        Marland Kitchen atorvastatin (LIPITOR) 20 MG tablet Take 10 mg by mouth at bedtime.       . Calcium Carb-Cholecalciferol (CALCIUM 600+D3) 600-200 MG-UNIT TABS Take 1 tablet by mouth 2 (two) times daily.        Marland Kitchen desonide (DESOWEN) 0.05 % ointment Apply 1 application topically 2 (two) times daily.       . diphenoxylate-atropine (LOMOTIL) 2.5-0.025 MG per tablet Take 1 tablet by mouth 4 (four) times daily as needed for diarrhea or loose stools.      Marland Kitchen  DULoxetine (CYMBALTA) 60 MG capsule Take 60 mg by mouth daily.       Marland Kitchen esomeprazole (NEXIUM) 40 MG capsule Take 1 capsule (40 mg total) by mouth daily.  90 capsule  3  . estradiol (ESTRACE) 0.1 MG/GM vaginal cream Place 2 g vaginally every Monday, Wednesday, and Friday.       . fish oil-omega-3 fatty acids 1000 MG capsule Take 1 capsule by mouth 2 (two) times daily.       Marland Kitchen gabapentin (NEURONTIN) 300 MG capsule Take 300 mg by mouth 3 (three) times daily.      Marland Kitchen glucose blood (FREESTYLE LITE) test strip Use as instructed to check BG up to qid. Dx: type 2 DM uncontrolled with insulin use  400 each  3  . hyoscyamine (LEVBID) 0.375 MG 12 hr tablet Take 0.375 mg by mouth every 12 (twelve) hours as needed for cramping.      . insulin aspart (NOVOLOG) 100 UNIT/ML injection Inject prior to each meal per sliding scale.BG less than 80 - no insulin; 80 to 120 - 4 units; 121 to 150 - 6 units; 151 to  200 - 7 units; 201 to 250 - 8 units; 250 or above - 9 units and call office.  15 mL  3  . Insulin Glargine (LANTUS SOLOSTAR) 100 UNIT/ML SOPN Inject 37 Units into the skin every evening.  5 pen  1  . Insulin Syringe-Needle U-100 (INSULIN SYRINGE .3CC/31GX5/16") 31G X 5/16" 0.3 ML MISC 1 each by Does not apply route as directed.  200 each  3  . Insulin Syringe-Needle U-100 (INSULIN SYRINGE 1CC/31GX5/16") 31G X 5/16" 1 ML MISC 1 each by Does not apply route as directed.  200 each  3  . meloxicam (MOBIC) 15 MG tablet Take 15 mg by mouth daily.      . metoprolol succinate (TOPROL-XL) 25 MG 24 hr tablet Take 0.5 tablets (12.5 mg total) by mouth at bedtime.  45 tablet  3  . Multiple Vitamin (MULTIVITAMIN) tablet Take 0.5 tablets by mouth 2 (two) times daily.       . nitrofurantoin (MACRODANTIN) 100 MG capsule Take 100 mg by mouth daily.        Marland Kitchen oxyCODONE-acetaminophen (PERCOCET/ROXICET) 5-325 MG per tablet Take 1 tablet by mouth every 6 (six) hours as needed for severe pain.      . traZODone (DESYREL) 150 MG tablet Take 1 tablet (150 mg total) by mouth at bedtime.  90 tablet  3   No current facility-administered medications on file prior to visit.   Allergies  Allergen Reactions  . Latex Anaphylaxis and Rash  . Amoxicillin   . Celebrex [Celecoxib]   . Fluticasone   . Hydrocodone   . Hydrocodone-Acetaminophen Other (See Comments)    hallucinations  . Hyoscyamine Sulfate   . Morphine     REACTION: hallucinations,GI upset  . Tramadol   . Vicoprofen [Hydrocodone-Ibuprofen]   . Zolpidem Tartrate     REACTION: hallucinations  . Prednisone Rash and Other (See Comments)    Hyperglycemia   . Sulfa Antibiotics Rash   Immunization History  Administered Date(s) Administered  . Influenza,inj,Quad PF,36+ Mos 07/30/2013  . Pneumococcal Polysaccharide-23 10/22/2009  . Td 10/25/2004  . Zoster 04/06/2012   Prior to Admission medications   Medication Sig Start Date End Date Taking? Authorizing  Provider  Acetaminophen (TYLENOL PO) Take by mouth as needed.    Historical Provider, MD  aspirin 81 MG tablet Take 81 mg by mouth daily.  Historical Provider, MD  atorvastatin (LIPITOR) 20 MG tablet Take 10 mg by mouth at bedtime.     Historical Provider, MD  Calcium Carb-Cholecalciferol (CALCIUM 600+D3) 600-200 MG-UNIT TABS Take 1 tablet by mouth 2 (two) times daily.      Historical Provider, MD  desonide (DESOWEN) 0.05 % ointment Apply 1 application topically 2 (two) times daily.     Historical Provider, MD  diphenoxylate-atropine (LOMOTIL) 2.5-0.025 MG per tablet Take 1 tablet by mouth 4 (four) times daily as needed for diarrhea or loose stools.    Historical Provider, MD  DULoxetine (CYMBALTA) 60 MG capsule Take 60 mg by mouth daily.     Historical Provider, MD  escitalopram (LEXAPRO) 20 MG tablet Take 10 mg by mouth daily.    Historical Provider, MD  esomeprazole (NEXIUM) 40 MG capsule Take 1 capsule (40 mg total) by mouth daily. 03/23/13   Vernie Shanks, MD  estradiol (ESTRACE) 0.1 MG/GM vaginal cream Place 2 g vaginally every Monday, Wednesday, and Friday.     Historical Provider, MD  fish oil-omega-3 fatty acids 1000 MG capsule Take 1 capsule by mouth 2 (two) times daily.     Historical Provider, MD  gabapentin (NEURONTIN) 300 MG capsule Take 300 mg by mouth 3 (three) times daily.    Historical Provider, MD  glucose blood (FREESTYLE LITE) test strip Use as instructed to check BG up to qid. Dx: type 2 DM uncontrolled with insulin use 11/01/13   Vernie Shanks, MD  hyoscyamine (LEVBID) 0.375 MG 12 hr tablet Take 0.375 mg by mouth every 12 (twelve) hours as needed for cramping.    Historical Provider, MD  insulin aspart (NOVOLOG) 100 UNIT/ML injection Inject prior to each meal per sliding scale.BG less than 80 - no insulin; 80 to 120 - 4 units; 121 to 150 - 6 units; 151 to 200 - 7 units; 201 to 250 - 8 units; 250 or above - 9 units and call office. 07/30/13   Tammy Eckard, PHARMD  Insulin  Glargine (LANTUS SOLOSTAR) 100 UNIT/ML SOPN Inject 37 Units into the skin every evening. 06/07/13   Tammy Eckard, PHARMD  Insulin Syringe-Needle U-100 (INSULIN SYRINGE .3CC/31GX5/16") 31G X 5/16" 0.3 ML MISC 1 each by Does not apply route as directed. 04/13/13   Vernie Shanks, MD  Insulin Syringe-Needle U-100 (INSULIN SYRINGE 1CC/31GX5/16") 31G X 5/16" 1 ML MISC 1 each by Does not apply route as directed. 04/13/13   Vernie Shanks, MD  meloxicam (MOBIC) 15 MG tablet Take 15 mg by mouth daily.    Historical Provider, MD  metoprolol succinate (TOPROL-XL) 25 MG 24 hr tablet Take 0.5 tablets (12.5 mg total) by mouth at bedtime. 03/23/13   Vernie Shanks, MD  Multiple Vitamin (MULTIVITAMIN) tablet Take 0.5 tablets by mouth 2 (two) times daily.     Historical Provider, MD  nitrofurantoin (MACRODANTIN) 100 MG capsule Take 100 mg by mouth daily.      Historical Provider, MD  oxyCODONE-acetaminophen (PERCOCET/ROXICET) 5-325 MG per tablet Take 1 tablet by mouth every 6 (six) hours as needed for severe pain.    Historical Provider, MD  traZODone (DESYREL) 150 MG tablet Take 1 tablet (150 mg total) by mouth at bedtime. 03/23/13   Vernie Shanks, MD  triamterene-hydrochlorothiazide (MAXZIDE-25) 37.5-25 MG per tablet Take by mouth. Take 1/2 tablet daily    Historical Provider, MD     ROS: As above in the HPI. All other systems are stable or negative.  OBJECTIVE: APPEARANCE:  Patient in no acute distress.The patient appeared well nourished and normally developed. Acyanotic. Waist: VITAL SIGNS:BP 113/61  Pulse 68  Temp(Src) 98.3 F (36.8 C) (Oral)  Ht 5' 3"  (1.6 m)  Wt 183 lb 12.8 oz (83.371 kg)  BMI 32.57 kg/m2 WF overweight/obese  SKIN: warm and  Dry without overt rashes, tattoos and scars  HEAD and Neck: without JVD, Head and scalp: normal Eyes:No scleral icterus. Fundi normal, eye movements normal. Ears: Auricle normal, canal normal, Tympanic membranes normal, insufflation normal. Nose:  normal Throat: normal Neck & thyroid: normal  CHEST & LUNGS: Chest wall: normal Lungs: Clear  CVS: Reveals the PMI to be normally located. Regular rhythm, First and Second Heart sounds are normal,  absence of murmurs, rubs or gallops. Peripheral vasculature: Radial pulses: normal Dorsal pedis pulses: normal Posterior pulses: normal  ABDOMEN:  Appearance: obese Benign, no organomegaly, no masses, no Abdominal Aortic enlargement. No Guarding , no rebound. No Bruits. Bowel sounds: normal  RECTAL: N/A GU: N/A  EXTREMETIES: nonedematous.  MUSCULOSKELETAL:  Spine: normal Joints: intact  NEUROLOGIC: oriented to time,place and person; nonfocal. Strength is normal Sensory is normal Reflexes are normal Cranial Nerves are normal.   Results for orders placed in visit on 09/10/13  THYROID PANEL WITH TSH      Result Value Ref Range   TSH 2.100  0.450 - 4.500 uIU/mL   T4, Total 5.3  4.5 - 12.0 ug/dL   T3 Uptake Ratio 32  24 - 39 %   Free Thyroxine Index 1.7  1.2 - 4.9  BMP8+EGFR      Result Value Ref Range   Glucose 127 (*) 65 - 99 mg/dL   BUN 21  8 - 27 mg/dL   Creatinine, Ser 1.14 (*) 0.57 - 1.00 mg/dL   GFR calc non Af Amer 48 (*) >59 mL/min/1.73   GFR calc Af Amer 55 (*) >59 mL/min/1.73   BUN/Creatinine Ratio 18  11 - 26   Sodium 136  134 - 144 mmol/L   Potassium 4.0  3.5 - 5.2 mmol/L   Chloride 94 (*) 97 - 108 mmol/L   CO2 30 (*) 18 - 29 mmol/L   Calcium 9.6  8.6 - 10.2 mg/dL  POCT CBC      Result Value Ref Range   WBC 5.8  4.6 - 10.2 K/uL   Lymph, poc 2.1  0.6 - 3.4   POC LYMPH PERCENT 35.9  10 - 50 %L   POC Granulocyte 3.6  2 - 6.9   Granulocyte percent 61.8  37 - 80 %G   RBC 4.6  4.04 - 5.48 M/uL   Hemoglobin 14.1  12.2 - 16.2 g/dL   HCT, POC 42.3  37.7 - 47.9 %   MCV 92.0  80 - 97 fL   MCH, POC 30.8  27 - 31.2 pg   MCHC 33.5  31.8 - 35.4 g/dL   RDW, POC 13.1     Platelet Count, POC 166.0  142 - 424 K/uL   MPV 7.0  0 - 99.8 fL    ASSESSMENT: HTN  (hypertension) - Plan: CMP14+EGFR, triamterene-hydrochlorothiazide (MAXZIDE-25) 37.5-25 MG per tablet, DISCONTINUED: triamterene-hydrochlorothiazide (MAXZIDE-25) 37.5-25 MG per tablet  HLD (hyperlipidemia) - Plan: CMP14+EGFR, NMR, lipoprofile  DIABETES MELLITUS, TYPE I - Plan: POCT glycosylated hemoglobin (Hb A1C)  Depression - Plan: escitalopram (LEXAPRO) 20 MG tablet, DISCONTINUED: escitalopram (LEXAPRO) 20 MG tablet  URINARY INCONTINENCE  UNSPECIFIED OSTEOPOROSIS  MITRAL VALVE PROLAPSE  Lichen planus  Irritable bowel syndrome  FIBROMYALGIA  PLAN: DM foot care in the AVS.    Orders Placed This Encounter  Procedures  . CMP14+EGFR  . NMR, lipoprofile  . POCT glycosylated hemoglobin (Hb A1C)   Meds ordered this encounter  Medications  . DISCONTD: triamterene-hydrochlorothiazide (MAXZIDE-25) 37.5-25 MG per tablet    Sig: Take 0.5 tablets by mouth daily.    Dispense:  30 tablet    Refill:  5  . DISCONTD: escitalopram (LEXAPRO) 20 MG tablet    Sig: Take 0.5 tablets (10 mg total) by mouth daily.    Dispense:  30 tablet    Refill:  5  . triamterene-hydrochlorothiazide (MAXZIDE-25) 37.5-25 MG per tablet    Sig: Take 0.5 tablets by mouth daily.    Dispense:  90 tablet    Refill:  3  . escitalopram (LEXAPRO) 20 MG tablet    Sig: Take 0.5 tablets (10 mg total) by mouth daily.    Dispense:  90 tablet    Refill:  3   Medications Discontinued During This Encounter  Medication Reason  . triamterene-hydrochlorothiazide (MAXZIDE-25) 37.5-25 MG per tablet Reorder  . escitalopram (LEXAPRO) 20 MG tablet Reorder  . triamterene-hydrochlorothiazide (MAXZIDE-25) 37.5-25 MG per tablet Reorder  . escitalopram (LEXAPRO) 20 MG tablet Reorder   Return in about 3 months (around 03/05/2014) for Recheck medical problems.  Cedra Villalon P. Jacelyn Grip, M.D.

## 2013-12-06 NOTE — Patient Instructions (Signed)
Diabetes and Foot Care Diabetes may cause you to have problems because of poor blood supply (circulation) to your feet and legs. This may cause the skin on your feet to become thinner, break easier, and heal more slowly. Your skin may become dry, and the skin may peel and crack. You may also have nerve damage in your legs and feet causing decreased feeling in them. You may not notice minor injuries to your feet that could lead to infections or more serious problems. Taking care of your feet is one of the most important things you can do for yourself.  HOME CARE INSTRUCTIONS  Wear shoes at all times, even in the house. Do not go barefoot. Bare feet are easily injured.  Check your feet daily for blisters, cuts, and redness. If you cannot see the bottom of your feet, use a mirror or ask someone for help.  Wash your feet with warm water (do not use hot water) and mild soap. Then pat your feet and the areas between your toes until they are completely dry. Do not soak your feet as this can dry your skin.  Apply a moisturizing lotion or petroleum jelly (that does not contain alcohol and is unscented) to the skin on your feet and to dry, brittle toenails. Do not apply lotion between your toes.  Trim your toenails straight across. Do not dig under them or around the cuticle. File the edges of your nails with an emery board or nail file.  Do not cut corns or calluses or try to remove them with medicine.  Wear clean socks or stockings every day. Make sure they are not too tight. Do not wear knee-high stockings since they may decrease blood flow to your legs.  Wear shoes that fit properly and have enough cushioning. To break in new shoes, wear them for just a few hours a day. This prevents you from injuring your feet. Always look in your shoes before you put them on to be sure there are no objects inside.  Do not cross your legs. This may decrease the blood flow to your feet.  If you find a minor scrape,  cut, or break in the skin on your feet, keep it and the skin around it clean and dry. These areas may be cleansed with mild soap and water. Do not cleanse the area with peroxide, alcohol, or iodine.  When you remove an adhesive bandage, be sure not to damage the skin around it.  If you have a wound, look at it several times a day to make sure it is healing.  Do not use heating pads or hot water bottles. They may burn your skin. If you have lost feeling in your feet or legs, you may not know it is happening until it is too late.  Make sure your health care provider performs a complete foot exam at least annually or more often if you have foot problems. Report any cuts, sores, or bruises to your health care provider immediately. SEEK MEDICAL CARE IF:   You have an injury that is not healing.  You have cuts or breaks in the skin.  You have an ingrown nail.  You notice redness on your legs or feet.  You feel burning or tingling in your legs or feet.  You have pain or cramps in your legs and feet.  Your legs or feet are numb.  Your feet always feel cold. SEEK IMMEDIATE MEDICAL CARE IF:   There is increasing redness,   swelling, or pain in or around a wound.  There is a red line that goes up your leg.  Pus is coming from a wound.  You develop a fever or as directed by your health care provider.  You notice a bad smell coming from an ulcer or wound. Document Released: 10/08/2000 Document Revised: 06/13/2013 Document Reviewed: 03/20/2013 [employer] Patient Information 2014 [employer].  

## 2014-01-01 DIAGNOSIS — E1149 Type 2 diabetes mellitus with other diabetic neurological complication: Secondary | ICD-10-CM | POA: Diagnosis not present

## 2014-01-01 DIAGNOSIS — L851 Acquired keratosis [keratoderma] palmaris et plantaris: Secondary | ICD-10-CM | POA: Diagnosis not present

## 2014-01-01 DIAGNOSIS — B351 Tinea unguium: Secondary | ICD-10-CM | POA: Diagnosis not present

## 2014-01-03 ENCOUNTER — Encounter: Payer: Self-pay | Admitting: Pharmacist

## 2014-01-03 ENCOUNTER — Ambulatory Visit (INDEPENDENT_AMBULATORY_CARE_PROVIDER_SITE_OTHER): Payer: Medicare Other | Admitting: Pharmacist

## 2014-01-03 VITALS — BP 136/72 | HR 78 | Ht 63.0 in | Wt 181.0 lb

## 2014-01-03 DIAGNOSIS — I10 Essential (primary) hypertension: Secondary | ICD-10-CM | POA: Diagnosis not present

## 2014-01-03 DIAGNOSIS — E119 Type 2 diabetes mellitus without complications: Secondary | ICD-10-CM | POA: Diagnosis not present

## 2014-01-03 DIAGNOSIS — E785 Hyperlipidemia, unspecified: Secondary | ICD-10-CM | POA: Diagnosis not present

## 2014-01-03 LAB — POCT GLYCOSYLATED HEMOGLOBIN (HGB A1C): Hemoglobin A1C: 7.3

## 2014-01-03 MED ORDER — EMPAGLIFLOZIN 10 MG PO TABS: 10.0000 mg | ORAL_TABLET | Freq: Every morning | ORAL | Status: DC

## 2014-01-03 NOTE — Progress Notes (Signed)
Diabetes Follow-Up Visit Chief Complaint:   Chief Complaint  Patient presents with  . Diabetes     Filed Vitals:   01/03/14 1207  BP: 136/72  Pulse: 78   Filed Weights   01/03/14 1207  Weight: 181 lb (82.101 kg)    HPI:  Diabetes: patient has long standing diabetes.  Her most recent A1C was 6.1%.  Over the last 2-3 months we have been adjusting her diabetes medications to try to get less fluctuation in her HBG readings.  On 04/05/2013 she started novolog with meals and metformin XR. Patient stopped metformin XR about 2 weeks ago because she had low energy.  Once she stopped metformin her energy level improved fir awhile but today she states that she is feeling fatigued.  Last  Current Diabetes Medications:   Lantus to 32 units daily  Inject prior to each meal per sliding scale.  BG less than 80 - no insulin  80 to 120 - 5 units  121 to 150 - 6 units  151 to 200 - 7 units  201 to 250 - 9 units  250 or above - 10 units and call office.     Polyuria:  negative  Polydipsia:  negative Polyphagia:  negatvie  BMI:  Body mass index is 32.07 kg/(m^2).   Weight changes:  Has lost a total of 19 lbs since 03/23/2013. General Appearance:  alert, oriented, no acute distress and obese Mood/Affect:  normal  Diet - Continues to try to limit CHO intake.  Exercise:  Walking 1-2 times per week  Home BG Monitoring:  Checking 3 to 5 times a day. Average:  143   High: 260 Low:  45 (patient experienced 2 episodes of hypoglycemia - on in am after taking 42 units of lantus the previous night and the other after dental appt and change in regular schedule)    Lab Results  Component Value Date   HGBA1C 7.3% 01/03/2014      Lab Results  Component Value Date   CHOL 107 08/09/2013   HDL 48.20 04/04/2009   LDLCALC 50 03/23/2013   TRIG 73 03/23/2013   CHOLHDL 3 04/04/2009    In House Urine Microalbumin was negative (08/09/2013)  Assessment: 1.  Diabetes.  Fluctuations in BG continue to  improve though A1c elevated today 2.  Blood Pressure.  controlled 3.  Lipids.  Controlled 4.  Obesity - patient has lost 19# since 03/23/2013 5.  Depression - improving    Recommendations: 1.  Medication recommendations at this time are as follows:    Start Jardiance 10mg  1 tablet qam  Continue Lantus to 32 units daily  Continue Novolog sliding scale   BG less than 80 - no insulin    80 to 120 - 5 units    121 to 150 - 6 units    151 to 200 - 7 units    201 to 250 - 8 units    250 or above - 9 units and call office     2.  Reviewed HBG goals:  Fasting 80-130 and 1-2 hour post prandial <180.  Patient is instructed to check BG 3 to 5 times per day.    3.  BP goal < 140/80. 4.  LDL goal of < 100, HDL > 40 and TG < 150. 5.  Eye Exam yearly and Dental Exam every 6 months. 6.   Orders Placed This Encounter  Procedures  . POCT glycosylated hemoglobin (Hb A1C)    7.  RTC in 1 month   Cherre Robins, PharmD, CPP

## 2014-01-03 NOTE — Patient Instructions (Signed)
Start Jardiance 10mg  1 tablet each morning for blood glucose.  Lantus 25 to 32 units at bedtime  Call me is you experience blood glucose less than 80 - may need to adjust insulin with new medication      Hypoglycemia (Low Blood Sugar) Hypoglycemia is when the glucose (sugar) in your blood is too low. Hypoglycemia can happen for many reasons. It can happen to people with or without diabetes. Hypoglycemia can develop quickly and can be a medical emergency.  CAUSES  Having hypoglycemia does not mean that you will develop diabetes. Different causes include:  Missed or delayed meals or not enough carbohydrates eaten.  Medication overdose. This could be by accident or deliberate. If by accident, your medication may need to be adjusted or changed.  Exercise or increased activity without adjustments in carbohydrates or medications.  A nerve disorder that affects body functions like your heart rate, blood pressure and digestion (autonomic neuropathy).  A condition where the stomach muscles do not function properly (gastroparesis). Therefore, medications may not absorb properly.  The inability to recognize the signs of hypoglycemia (hypoglycemic unawareness).  Absorption of insulin  may be altered.  Alcohol consumption.  Pregnancy/menstrual cycles/postpartum. This may be due to hormones.  Certain kinds of tumors. This is very rare. SYMPTOMS   Sweating.  Hunger.  Dizziness.  Blurred vision.  Drowsiness.  Weakness.  Headache.  Rapid heart beat.  Shakiness.  Nervousness. DIAGNOSIS  Diagnosis is made by monitoring blood glucose in one or all of the following ways:  Fingerstick blood glucose monitoring.  Laboratory results. TREATMENT  If you think your blood glucose is low:  Check your blood glucose, if possible. If it is less than 70 mg/dl, take one of the following:  3-4 glucose tablets.   cup juice (prefer clear like apple).   cup "regular" soda pop.  1 cup  milk.  -1 tube of glucose gel.  5-6 hard candies.  Do not over treat because your blood glucose (sugar) will only go too high.  Wait 15 minutes and recheck your blood glucose. If it is still less than 70 mg/dl (or below your target range), repeat treatment.  Eat a snack if it is more than one hour until your next meal. Sometimes, your blood glucose may go so low that you are unable to treat yourself. You may need someone to help you. You may even pass out or be unable to swallow. This may require you to get an injection of glucagon, which raises the blood glucose. HOME CARE INSTRUCTIONS  Check blood glucose as recommended by your caregiver.  Take medication as prescribed by your caregiver.  Follow your meal plan. Do not skip meals. Eat on time.  If you are going to drink alcohol, drink it only with meals.  Check your blood glucose before driving.  Check your blood glucose before and after exercise. If you exercise longer or different than usual, be sure to check blood glucose more frequently.  Always carry treatment with you. Glucose tablets are the easiest to carry.  Always wear medical alert jewelry or carry some form of identification that states that you have diabetes. This will alert people that you have diabetes. If you have hypoglycemia, they will have a better idea on what to do. SEEK MEDICAL CARE IF:   You are having problems keeping your blood sugar at target range.  You are having frequent episodes of hypoglycemia.  You feel you might be having side effects from your medicines.  You have symptoms of an illness that is not improving after 3-4 days.  You notice a change in vision or a new problem with your vision. SEEK IMMEDIATE MEDICAL CARE IF:   You are a family member or friend of a person whose blood glucose goes below 70 mg/dl and is accompanied by:  Confusion.  A change in mental status.  The inability to swallow.  Passing out. Document Released:  10/11/2005 Document Revised: 01/03/2012 Document Reviewed: 02/07/2012 [facility] Patient Information 2014 [place], [place].

## 2014-01-03 NOTE — Addendum Note (Signed)
Addended by: Earlene Plater on: 01/03/2014 12:26 PM   Modules accepted: Orders

## 2014-01-04 ENCOUNTER — Encounter: Payer: Self-pay | Admitting: Surgery

## 2014-01-04 LAB — CMP14+EGFR
ALT: 17 IU/L (ref 0–32)
AST: 19 IU/L (ref 0–40)
Albumin/Globulin Ratio: 2.2 (ref 1.1–2.5)
Albumin: 4.1 g/dL (ref 3.5–4.8)
Alkaline Phosphatase: 64 IU/L (ref 39–117)
BUN/Creatinine Ratio: 20 (ref 11–26)
BUN: 20 mg/dL (ref 8–27)
CO2: 27 mmol/L (ref 18–29)
Calcium: 9.3 mg/dL (ref 8.7–10.3)
Chloride: 91 mmol/L — ABNORMAL LOW (ref 97–108)
Creatinine, Ser: 0.99 mg/dL (ref 0.57–1.00)
GFR calc Af Amer: 65 mL/min/{1.73_m2} (ref >59)
GFR calc non Af Amer: 57 mL/min/{1.73_m2} — ABNORMAL LOW (ref >59)
Globulin, Total: 1.9 g/dL (ref 1.5–4.5)
Glucose: 192 mg/dL — ABNORMAL HIGH (ref 65–99)
Potassium: 4.4 mmol/L (ref 3.5–5.2)
Sodium: 133 mmol/L — ABNORMAL LOW (ref 134–144)
Total Bilirubin: 0.4 mg/dL (ref 0.0–1.2)
Total Protein: 6 g/dL (ref 6.0–8.5)

## 2014-01-07 ENCOUNTER — Ambulatory Visit (INDEPENDENT_AMBULATORY_CARE_PROVIDER_SITE_OTHER): Payer: Medicare Other | Admitting: Surgery

## 2014-01-07 ENCOUNTER — Encounter: Payer: Self-pay | Admitting: Surgery

## 2014-01-07 ENCOUNTER — Encounter: Payer: Self-pay | Admitting: Pharmacist

## 2014-01-07 ENCOUNTER — Other Ambulatory Visit: Payer: Self-pay | Admitting: *Deleted

## 2014-01-07 VITALS — BP 135/57 | HR 66 | Ht 63.0 in | Wt 184.1 lb

## 2014-01-07 DIAGNOSIS — I723 Aneurysm of iliac artery: Secondary | ICD-10-CM | POA: Diagnosis not present

## 2014-01-07 NOTE — Progress Notes (Signed)
Patient name: [name] MRN: [id number] DOB: Dec 18, 1940 Sex: female   Referred by: Dr. Tonita Cong  Reason for referral:  Chief Complaint  Patient presents with  . New Evaluation    bilateral iliac artery aneurysms     HISTORY OF PRESENT ILLNESS: This is a very pleasant 73 year old female who was referred for evaluation of bilateral iliac artery aneurysms.  These were detected incidentally on a MRI of the lumbar spine, for which the patient had full workup of her degenerative disc disease.  By the report the iliac arteries measure over 3 cm in largest transverse diameter.  The patient suffers from diabetes.  Her most recent hemoglobin A1c was 7.3.  This had increased from 6.1, secondary to steroid treatment.  The patient's medically managed for hypertension.  She is on a statin for hyperlipidemia.  She also suffers from irritable bowel she has never smoked.  Past Medical History  Diagnosis Date  . Fibromyalgia   . IBS (irritable bowel syndrome)   . Diabetic retinopathy   . Osteoporosis   . Acute cystitis   . Depression   . Diabetes mellitus type II   . Hyperlipidemia   . Hypertension   . GERD (gastroesophageal reflux disease)   . Insomnia   . Urge incontinence of urine   . IBS (irritable bowel syndrome)   . Postmenopausal   . Lichen planus     vulvar  . DM type 2 causing CKD stage 3     Past Surgical History  Procedure Laterality Date  . Cataract extraction w/ intraocular lens implant  09/2003, 12/2003  . Cholecystectomy  2004  . Tubal ligation  1970  . Ankle surgery  02/2004  . Total knee arthroplasty      Left  . Eye surgery      laser treatments  . Trigger finger release Left 01/30/2002  . Joint replacement      History   Social History  . Marital Status: Widowed    Spouse Name: N/A    Number of Children: N/A  . Years of Education: N/A   Occupational History  . retired    Social History Main Topics  . Smoking status: Never Smoker   . Smokeless  tobacco: Never Used  . Alcohol Use: No  . Drug Use: No  . Sexual Activity: No   Other Topics Concern  . Not on file   Social History Narrative   Widowed in 2010    Family History  Problem Relation Age of Onset  . Diabetes Mother   . Hypertension Mother   . Kidney disease Mother   . Heart disease Mother   . Peripheral vascular disease Mother     amputation  . Cancer Father     prostate  . Diabetes Father   . Heart disease Father     CABG  . Hypertension Father   . Heart attack Father   . Depression Sister   . Hypertension Sister     Allergies as of 01/07/2014 - Review Complete 01/07/2014  Allergen Reaction Noted  . Latex Anaphylaxis and Rash 06/12/2012  . Amoxicillin  05/20/2008  . Celebrex [celecoxib]  12/24/2012  . Fluticasone  12/24/2012  . Hydrocodone    . Hydrocodone-acetaminophen Other (See Comments) 05/09/2009  . Hyoscyamine sulfate    . Morphine    . Tramadol  05/26/2012  . Vicoprofen [hydrocodone-ibuprofen]  12/24/2012  . Zolpidem tartrate  01/13/2009  . Prednisone Rash and Other (See Comments) 05/26/2012  .  Sulfa antibiotics Rash 05/26/2012    Current Outpatient Prescriptions on File Prior to Visit  Medication Sig Dispense Refill  . aspirin 81 MG tablet Take 81 mg by mouth daily.        Marland Kitchen atorvastatin (LIPITOR) 20 MG tablet Take 10 mg by mouth at bedtime.       . Calcium Carb-Cholecalciferol (CALCIUM 600+D3) 600-200 MG-UNIT TABS Take 1 tablet by mouth 2 (two) times daily.        Marland Kitchen desonide (DESOWEN) 0.05 % ointment Apply 1 application topically 2 (two) times daily.       . diphenoxylate-atropine (LOMOTIL) 2.5-0.025 MG per tablet Take 1 tablet by mouth 4 (four) times daily as needed for diarrhea or loose stools.      . DULoxetine (CYMBALTA) 60 MG capsule Take 60 mg by mouth daily.       Marland Kitchen escitalopram (LEXAPRO) 20 MG tablet Take 0.5 tablets (10 mg total) by mouth daily.  90 tablet  3  . esomeprazole (NEXIUM) 40 MG capsule Take 1 capsule (40 mg total)  by mouth daily.  90 capsule  3  . estradiol (ESTRACE) 0.1 MG/GM vaginal cream Place 2 g vaginally every Monday, Wednesday, and Friday.       . fish oil-omega-3 fatty acids 1000 MG capsule Take 1 capsule by mouth 2 (two) times daily.       Marland Kitchen glucose blood (FREESTYLE LITE) test strip Use as instructed to check BG up to qid. Dx: type 2 DM uncontrolled with insulin use  400 each  3  . insulin aspart (NOVOLOG) 100 UNIT/ML injection Inject prior to each meal per sliding scale.BG less than 80 - no insulin; 80 to 120 - 4 units; 121 to 150 - 6 units; 151 to 200 - 7 units; 201 to 250 - 8 units; 250 or above - 9 units and call office.  15 mL  3  . Insulin Glargine (LANTUS SOLOSTAR) 100 UNIT/ML SOPN Inject 37 Units into the skin every evening.  5 pen  1  . meloxicam (MOBIC) 15 MG tablet Take 15 mg by mouth daily.      . Multiple Vitamin (MULTIVITAMIN) tablet Take 0.5 tablets by mouth 2 (two) times daily.       . naproxen sodium (ANAPROX) 220 MG tablet Take 220 mg by mouth 2 (two) times daily with a meal.      . nitrofurantoin (MACRODANTIN) 100 MG capsule Take 100 mg by mouth daily.        . traZODone (DESYREL) 150 MG tablet Take 1 tablet (150 mg total) by mouth at bedtime.  90 tablet  3  . Acetaminophen (TYLENOL PO) Take by mouth as needed.      . Empagliflozin 10 MG TABS Take 10 mg by mouth every morning.  28 tablet  0  . hyoscyamine (LEVBID) 0.375 MG 12 hr tablet Take 0.375 mg by mouth every 12 (twelve) hours as needed for cramping.      . Insulin Syringe-Needle U-100 (INSULIN SYRINGE 1CC/31GX5/16") 31G X 5/16" 1 ML MISC 1 each by Does not apply route as directed.  200 each  3  . oxyCODONE-acetaminophen (PERCOCET/ROXICET) 5-325 MG per tablet Take 1 tablet by mouth every 6 (six) hours as needed for severe pain.      Marland Kitchen triamterene-hydrochlorothiazide (MAXZIDE-25) 37.5-25 MG per tablet Take 0.5 tablets by mouth daily.  90 tablet  3   No current facility-administered medications on file prior to visit.      REVIEW OF SYSTEMS: Cardiovascular:  No chest pain, chest pressure, palpitations, orthopnea, or dyspnea on exertion. No claudication or rest pain,  No history of DVT or phlebitis. Pulmonary: No productive cough, asthma or wheezing. Neurologic: No weakness, paresthesias, aphasia, or amaurosis. No dizziness. Hematologic: No bleeding problems or clotting disorders. Musculoskeletal: Positive for constant right leg pain, weakness and numbness Gastrointestinal: No blood in stool or hematemesis Genitourinary: No dysuria or hematuria. Psychiatric:: Positive for history of major depression. Integumentary: No rashes or ulcers. Constitutional: No fever.  Positive for chills  PHYSICAL EXAMINATION: General: The patient appears their stated age.  Vital signs are BP 135/57  Pulse 66  Ht 5\' 3"  (1.6 m)  Wt 184 lb 1.6 oz (83.507 kg)  BMI 32.62 kg/m2  SpO2 100% HEENT:  No gross abnormalities Pulmonary: Respirations are non-labored Abdomen: Soft and non-tender.  Aneurysm not appreciated  Musculoskeletal: There are no major deformities.   Neurologic: No focal weakness or paresthesias are detected, Skin: There are no ulcer or rashes noted. Psychiatric: The patient has normal affect. Cardiovascular: There is a regular rate and rhythm without significant murmur appreciated.  No carotid bruits.  Palpable pedal pulses.  Diagnostic Studies: I have reviewed her MRI images..  By the report, iliac arteries measure 3 cm    Assessment:  Bilateral iliac artery aneurysms Plan: I discussed the MRI findings with the patient.  I feel that these need to be better evaluated with a CT angiogram.  Based on the results of this study I would determine if treatment is needed, and whether or not she would be a candidate for minimally invasive repair.  I have scheduled this CT scan within the next week.  The patient will follow up in my office in one week to discuss the results.     Eldridge Abrahams,  M.D. [facility] Office: [phone] Pager:  [phone]

## 2014-01-09 ENCOUNTER — Encounter: Payer: Self-pay | Admitting: Specialist

## 2014-01-11 ENCOUNTER — Encounter: Payer: Self-pay | Admitting: Surgery

## 2014-01-14 ENCOUNTER — Ambulatory Visit (INDEPENDENT_AMBULATORY_CARE_PROVIDER_SITE_OTHER): Payer: Self-pay | Admitting: Surgery

## 2014-01-14 ENCOUNTER — Ambulatory Visit
Admission: RE | Admit: 2014-01-14 | Discharge: 2014-01-14 | Disposition: A | Discharge status: Home / Self Care | Payer: Medicare Other | Source: Ambulatory Visit | Attending: Surgery | Admitting: Surgery

## 2014-01-14 DIAGNOSIS — I708 Atherosclerosis of other arteries: Secondary | ICD-10-CM | POA: Diagnosis not present

## 2014-01-14 DIAGNOSIS — I723 Aneurysm of iliac artery: Secondary | ICD-10-CM

## 2014-01-14 MED ORDER — IOHEXOL 350 MG/ML SOLN
100.0000 mL | Freq: Once | INTRAVENOUS | Status: AC | PRN
  Administered 2014-01-14: 100 mL via INTRAVENOUS

## 2014-01-14 NOTE — Progress Notes (Signed)
The patient initially had iliac aneurysm detected on MRI.  When I reviewed the MRI I felt it needed to be better defined by CT scan.  The patient had a CT angiogram today.  There was no evidence of iliac artery aneurysm only mild ectasia of the left internal iliac artery with a maximum diameter of 7 mm.  The patient did not want to stay for her visit and therefore I told her the results in the waiting area.  She does not need a followup

## 2014-01-17 DIAGNOSIS — N3941 Urge incontinence: Secondary | ICD-10-CM | POA: Diagnosis not present

## 2014-01-17 DIAGNOSIS — N390 Urinary tract infection, site not specified: Secondary | ICD-10-CM | POA: Diagnosis not present

## 2014-01-17 DIAGNOSIS — N3020 Other chronic cystitis without hematuria: Secondary | ICD-10-CM | POA: Diagnosis not present

## 2014-01-18 ENCOUNTER — Telehealth: Payer: Self-pay | Admitting: Family Medicine

## 2014-01-20 ENCOUNTER — Other Ambulatory Visit: Payer: Self-pay | Admitting: Family Medicine

## 2014-01-20 DIAGNOSIS — B3731 Acute candidiasis of vulva and vagina: Secondary | ICD-10-CM

## 2014-01-20 DIAGNOSIS — B373 Candidiasis of vulva and vagina: Secondary | ICD-10-CM

## 2014-01-20 MED ORDER — FLUCONAZOLE 150 MG PO TABS: 150.0000 mg | ORAL_TABLET | Freq: Once | ORAL | Status: DC

## 2014-01-20 NOTE — Telephone Encounter (Signed)
Will Rx diflucan. Ordered in [facility]

## 2014-01-20 NOTE — Telephone Encounter (Signed)
Hold her statin for a few days when she takes the diflucan.

## 2014-01-21 NOTE — Telephone Encounter (Signed)
Pt.notified

## 2014-01-22 ENCOUNTER — Telehealth: Payer: Self-pay | Admitting: Family Medicine

## 2014-01-23 DIAGNOSIS — M545 Low back pain, unspecified: Secondary | ICD-10-CM | POA: Diagnosis not present

## 2014-01-23 DIAGNOSIS — M5137 Other intervertebral disc degeneration, lumbosacral region: Secondary | ICD-10-CM | POA: Diagnosis not present

## 2014-01-23 DIAGNOSIS — M47817 Spondylosis without myelopathy or radiculopathy, lumbosacral region: Secondary | ICD-10-CM | POA: Diagnosis not present

## 2014-01-24 ENCOUNTER — Other Ambulatory Visit: Payer: Self-pay | Admitting: Family Medicine

## 2014-01-24 MED ORDER — DULOXETINE HCL 60 MG PO CPEP
60.0000 mg | ORAL_CAPSULE | Freq: Every day | ORAL | Status: DC
Start: 2014-01-24 — End: 2014-02-04

## 2014-01-24 MED ORDER — DULOXETINE HCL 60 MG PO CPEP: 60.0000 mg | ORAL_CAPSULE | Freq: Every day | ORAL | Status: DC

## 2014-01-31 ENCOUNTER — Ambulatory Visit (INDEPENDENT_AMBULATORY_CARE_PROVIDER_SITE_OTHER): Payer: Medicare Other | Admitting: Pharmacist

## 2014-01-31 ENCOUNTER — Encounter: Payer: Self-pay | Admitting: Pharmacist

## 2014-01-31 ENCOUNTER — Encounter: Payer: Medicare Other | Admitting: Vascular Surgery

## 2014-01-31 ENCOUNTER — Other Ambulatory Visit (HOSPITAL_COMMUNITY): Payer: Medicare Other

## 2014-01-31 VITALS — BP 134/68 | HR 70 | Ht 63.0 in | Wt 180.5 lb

## 2014-01-31 DIAGNOSIS — E119 Type 2 diabetes mellitus without complications: Secondary | ICD-10-CM | POA: Diagnosis not present

## 2014-01-31 DIAGNOSIS — I10 Essential (primary) hypertension: Secondary | ICD-10-CM

## 2014-01-31 NOTE — Progress Notes (Signed)
Diabetes Follow-Up Visit Chief Complaint:   Chief Complaint  Patient presents with  . Diabetes     Filed Vitals:   01/31/14 1030  BP: 134/68  Pulse: 70   Filed Weights   01/31/14 1030  Weight: 180 lb 8 oz (81.874 kg)    HPI:  Diabetes: patient has long standing diabetes.  Her most recent A1C was 6.1%.  Over the last 2-3 months we have been adjusting her diabetes medications to try to get less fluctuation in her HBG readings.  Current medications for diabetes include - Jardiance 39m 1 tablet daily (started 1 month ago), Lantus 25-28 units daily (patient uses 28 units if evening BG is greater than 225) and Novolog sliding scale (BG less than 80 - no insulin, 80 to 120 - 5 units, 121 to 150 - 6 units, 151 to 200 - 7 units, 201 to 250 - 8 units, 250 or above - 9 units and call office  Patient has tried metformin in past but stopped because she had low energy.  Once she stopped metformin her energy level improved but she is unsure if it really was metformin or not.   Polyuria:  negative  Polydipsia:  negative Polyphagia:  negatvie  BMI:  Body mass index is 31.98 kg/(m^2).   Weight changes:  Has lost a total of 21 lbs since 03/23/2013. General Appearance:  alert, oriented, no acute distress and obese Mood/Affect:  normal  Diet - Continues to try to limit CHO intake.  Exercise:  Walking 1-2 times per week  Home BG Monitoring:  Checking 3 to 5 times a day. Average:  138 in am, 132 at lunch, 167 at dinner and 180 at bedtime  High: 234 Low:  89    Lab Results  Component Value Date   HGBA1C 7.3% 01/03/2014      Lab Results  Component Value Date   CHOL 107 08/09/2013   HDL 48.20 04/04/2009   LDLCALC 50 03/23/2013   TRIG 73 03/23/2013   CHOLHDL 3 04/04/2009    In House Urine Microalbumin was negative (08/09/2013)   Assessment: 1.  Diabetes.  Fluctuations in BG continue to improve though A1c elevated. 2.  Blood Pressure.  controlled 3.  Lipids.  Controlled 4.  Obesity - patient  has lost 21# since 03/23/2013    Recommendations: 1.  Medication recommendations at this time are as follows:    Continue Jardiance 164m1 tablet qam - if BMET is WNL will increase to 2570m tablet daily in am.  Continue Lantus to 32 units daily  Continue Novolog sliding scale   BG less than 80 - no insulin    80 to 120 - 5 units    121 to 150 - 6 units    151 to 200 - 7 units    201 to 250 - 8 units    250 or above - 9 units and call office     2.  Reviewed HBG goals:  Fasting 80-130 and 1-2 hour post prandial <180.  Patient is instructed to check BG 3 to 5 times per day.    3.  BP goal < 140/80. 4.  LDL goal of < 100, HDL > 40 and TG < 150. 5.  Eye Exam yearly and Dental Exam every 6 months. 6.   Orders Placed This Encounter  Procedures  . BMP8+EGFR  patient has appt for mammogram and DEXA for May 2015.  7.  RTC in 2 month   TamGlennallen  PharmD, CPP

## 2014-02-01 LAB — BMP8+EGFR
BUN/Creatinine Ratio: 21 (ref 11–26)
BUN: 31 mg/dL — ABNORMAL HIGH (ref 8–27)
CO2: 28 mmol/L (ref 18–29)
CREATININE: 1.5 mg/dL — AB (ref 0.57–1.00)
Calcium: 9.3 mg/dL (ref 8.7–10.3)
Chloride: 95 mmol/L — ABNORMAL LOW (ref 97–108)
GFR, EST AFRICAN AMERICAN: 40 mL/min/{1.73_m2} — AB (ref >59)
GFR, EST NON AFRICAN AMERICAN: 34 mL/min/{1.73_m2} — AB (ref >59)
Glucose: 174 mg/dL — ABNORMAL HIGH (ref 65–99)
POTASSIUM: 4.1 mmol/L (ref 3.5–5.2)
SODIUM: 137 mmol/L (ref 134–144)

## 2014-02-04 NOTE — Telephone Encounter (Signed)
She picked up 30 day supply at [facility] but needs 90 day supply written to mail to [place].

## 2014-02-05 ENCOUNTER — Ambulatory Visit: Payer: Medicare Other | Admitting: Physical Therapy

## 2014-02-05 ENCOUNTER — Other Ambulatory Visit: Payer: Self-pay | Admitting: Family Medicine

## 2014-02-05 NOTE — Telephone Encounter (Signed)
Left message to return call 

## 2014-02-05 NOTE — Telephone Encounter (Signed)
She is no longer on cymbalta?? She is on lexapro Refill denied. Bring all medications at next office visit.

## 2014-02-06 ENCOUNTER — Telehealth: Payer: Self-pay | Admitting: Pharmacist

## 2014-02-06 NOTE — Telephone Encounter (Signed)
GFR has increase since starting Jardiance.   Patient to hold Jardiance for next 2 weeks and have BMET rechecked.  Will decide if OK to restart after BMET results.

## 2014-02-07 ENCOUNTER — Other Ambulatory Visit: Payer: Self-pay | Admitting: Family Medicine

## 2014-02-07 MED ORDER — DULOXETINE HCL 60 MG PO CPEP: 60.0000 mg | ORAL_CAPSULE | Freq: Every day | ORAL | Status: DC

## 2014-02-07 NOTE — Telephone Encounter (Signed)
Duplicate message pt notified see previous message

## 2014-02-07 NOTE — Telephone Encounter (Signed)
Rx ready for pick up. 

## 2014-02-07 NOTE — Telephone Encounter (Signed)
Pt aware to pick up rx 

## 2014-02-07 NOTE — Telephone Encounter (Signed)
Pt aware rx ready for pick up. 

## 2014-02-07 NOTE — Telephone Encounter (Signed)
I thought she was switched to lexapro????

## 2014-02-07 NOTE — Telephone Encounter (Signed)
Spoke with pt and she is on cymbalta and lexapro 10mg   She wants written rx for cymbalta #90 with 3 refills and will pick up tomorrrow

## 2014-02-11 ENCOUNTER — Ambulatory Visit: Payer: Medicare Other | Admitting: Physical Therapy

## 2014-02-20 ENCOUNTER — Other Ambulatory Visit (INDEPENDENT_AMBULATORY_CARE_PROVIDER_SITE_OTHER): Payer: Medicare Other

## 2014-02-20 DIAGNOSIS — R7989 Other specified abnormal findings of blood chemistry: Secondary | ICD-10-CM | POA: Diagnosis not present

## 2014-02-20 NOTE — Progress Notes (Signed)
Patient came in for labs only.

## 2014-02-21 LAB — BMP8+EGFR
BUN/Creatinine Ratio: 26 (ref 11–26)
BUN: 32 mg/dL — ABNORMAL HIGH (ref 8–27)
CO2: 28 mmol/L (ref 18–29)
CREATININE: 1.25 mg/dL — AB (ref 0.57–1.00)
Calcium: 9.2 mg/dL (ref 8.7–10.3)
Chloride: 93 mmol/L — ABNORMAL LOW (ref 97–108)
GFR calc non Af Amer: 43 mL/min/{1.73_m2} — ABNORMAL LOW (ref >59)
GFR, EST AFRICAN AMERICAN: 49 mL/min/{1.73_m2} — AB (ref >59)
GLUCOSE: 209 mg/dL — AB (ref 65–99)
POTASSIUM: 4.7 mmol/L (ref 3.5–5.2)
Sodium: 134 mmol/L (ref 134–144)

## 2014-02-27 ENCOUNTER — Telehealth: Payer: Self-pay | Admitting: Pharmacist

## 2014-02-27 NOTE — Telephone Encounter (Signed)
Called about lab results - no answer, left message.

## 2014-02-28 NOTE — Telephone Encounter (Signed)
Serum creatinine has improved since off Jardiance but patient c/o increase weight and BG.   Patient instructed can restart Jardiance at 5mg  (1/2 tablet) once a day.  RTC in 2-3 weeks for follow up and recheck BMET

## 2014-03-06 ENCOUNTER — Telehealth: Payer: Self-pay | Admitting: Family Medicine

## 2014-03-06 MED ORDER — EMPAGLIFLOZIN 10 MG PO TABS: 5.0000 mg | ORAL_TABLET | Freq: Every morning | ORAL | Status: DC

## 2014-03-06 NOTE — Telephone Encounter (Signed)
No samples available - rx sent to pharmacy.

## 2014-03-12 DIAGNOSIS — H26499 Other secondary cataract, unspecified eye: Secondary | ICD-10-CM | POA: Diagnosis not present

## 2014-03-12 DIAGNOSIS — E11339 Type 2 diabetes mellitus with moderate nonproliferative diabetic retinopathy without macular edema: Secondary | ICD-10-CM | POA: Diagnosis not present

## 2014-03-12 LAB — HM DIABETES EYE EXAM

## 2014-03-19 DIAGNOSIS — Z780 Asymptomatic menopausal state: Secondary | ICD-10-CM | POA: Diagnosis not present

## 2014-03-19 DIAGNOSIS — Z1231 Encounter for screening mammogram for malignant neoplasm of breast: Secondary | ICD-10-CM | POA: Diagnosis not present

## 2014-03-21 ENCOUNTER — Ambulatory Visit (INDEPENDENT_AMBULATORY_CARE_PROVIDER_SITE_OTHER): Payer: Medicare Other | Admitting: Pharmacist

## 2014-03-21 ENCOUNTER — Encounter: Payer: Self-pay | Admitting: Pharmacist

## 2014-03-21 VITALS — BP 108/60 | HR 72 | Ht 63.0 in | Wt 180.5 lb

## 2014-03-21 DIAGNOSIS — B3731 Acute candidiasis of vulva and vagina: Secondary | ICD-10-CM

## 2014-03-21 DIAGNOSIS — Z79899 Other long term (current) drug therapy: Secondary | ICD-10-CM

## 2014-03-21 DIAGNOSIS — E119 Type 2 diabetes mellitus without complications: Secondary | ICD-10-CM | POA: Diagnosis not present

## 2014-03-21 DIAGNOSIS — B373 Candidiasis of vulva and vagina: Secondary | ICD-10-CM

## 2014-03-21 LAB — POCT URINALYSIS DIPSTICK
BILIRUBIN UA: NEGATIVE
Blood, UA: NEGATIVE
Glucose, UA: 250
KETONES UA: NEGATIVE
Leukocytes, UA: NEGATIVE
NITRITE UA: NEGATIVE
PH UA: 6
Protein, UA: NEGATIVE
Spec Grav, UA: 1.01
Urobilinogen, UA: NEGATIVE

## 2014-03-21 MED ORDER — FLUCONAZOLE 150 MG PO TABS: 150.0000 mg | ORAL_TABLET | Freq: Once | ORAL | Status: DC

## 2014-03-21 NOTE — Progress Notes (Signed)
Diabetes Follow-Up Visit Chief Complaint:   Chief Complaint  Patient presents with  . Diabetes     Filed Vitals:   03/21/14 1055  BP: 108/60  Pulse: 72   Filed Weights   03/21/14 1055  Weight: 180 lb 8 oz (81.874 kg)    HPI:  Diabetes: patient has long standing diabetes.  Her most recent A1C was 6.1%.  Over the last 2-3 months we have been adjusting her diabetes medications to try to get less fluctuation in her HBG readings.  Current medications for diabetes include - Jardiance 87m 1/2 tablet daily (started 1 month ago), Lantus 25-28 units daily (patient uses 28 units if evening BG is greater than 225) and Novolog sliding scale (BG less than 80 - no insulin, 80 to 120 - 5 units, 121 to 150 - 6 units, 151 to 200 - 7 units, 201 to 250 - 8 units, 250 or above - 9 units and call office  Patient has tried metformin in past but stopped because she had low energy.  Once she stopped metformin her energy level improved but she is unsure if it really was metformin or not.   Polyuria:  negative  Polydipsia:  negative Polyphagia:  negatvie  BMI:  Body mass index is 31.98 kg/(m^2).   Weight changes:  Stable currently though over the last year she has lost about 21# General Appearance:  alert, oriented, no acute distress and obese Mood/Affect:  normal  Diet - Continues to try to limit calorie or CHO intake.  Exercise:  Walking 1-2 times per week  Home BG Monitoring:  Checking 3 to 5 times a day. Average:  150 in am, 142 at lunch, 170 at dinner and 178 at bedtime  High: 260 Low:  97    Lab Results  Component Value Date   HGBA1C 7.3% 01/03/2014      Lab Results  Component Value Date   CHOL 107 08/09/2013   HDL 44 08/09/2013   LDLCALC 45 08/09/2013   TRIG 90 08/09/2013   CHOLHDL 3 04/04/2009    In House Urine Microalbumin was negative (08/09/2013)   Assessment: 1.  Diabetes.  Fluctuations in BG continue and  A1c slightly above goal  2.  Blood Pressure.  controlled 3.  Lipids.   Controlled 4.  Obesity - patient has lost 21# since 03/23/2013    Recommendations: 1.  Medication recommendations at this time are as follows:    Continue Jardiance 160m1/2 tablet qam - if BMET is WNL will consider increase back to 1067m tablet daily in am.  Continue Lantus to 32 units daily  Continue Novolog sliding scale   BG less than 80 - no insulin    80 to 120 - 5 units    121 to 150 - 6 units    151 to 200 - 7 units    201 to 250 - 8 units    250 or above - 9 units and call office     2.  Reviewed HBG goals:  Fasting 80-130 and 1-2 hour post prandial <180.  Patient is instructed to check BG 3 to 5 times per day.    3.  BP goal < 140/80. 4.  LDL goal of < 100, HDL > 40 and TG < 150. 5.  Eye Exam yearly and Dental Exam every 6 months. 6.   Orders Placed This Encounter  Procedures  . BMP8+EGFR  . POCT urinalysis dipstick   7.  RTC in 2 month  Cherre Robins, PharmD, CPP

## 2014-03-22 LAB — BMP8+EGFR
BUN/Creatinine Ratio: 24 (ref 11–26)
BUN: 34 mg/dL — ABNORMAL HIGH (ref 8–27)
CALCIUM: 8.9 mg/dL (ref 8.7–10.3)
CO2: 28 mmol/L (ref 18–29)
CREATININE: 1.42 mg/dL — AB (ref 0.57–1.00)
Chloride: 94 mmol/L — ABNORMAL LOW (ref 97–108)
GFR calc Af Amer: 42 mL/min/{1.73_m2} — ABNORMAL LOW (ref >59)
GFR, EST NON AFRICAN AMERICAN: 37 mL/min/{1.73_m2} — AB (ref >59)
GLUCOSE: 180 mg/dL — AB (ref 65–99)
Potassium: 4.8 mmol/L (ref 3.5–5.2)
Sodium: 135 mmol/L (ref 134–144)

## 2014-03-25 ENCOUNTER — Telehealth: Payer: Self-pay | Admitting: Pharmacist

## 2014-03-25 ENCOUNTER — Encounter: Payer: Self-pay | Admitting: *Deleted

## 2014-03-25 ENCOUNTER — Encounter: Payer: Self-pay | Admitting: Pharmacist

## 2014-03-25 NOTE — Telephone Encounter (Signed)
GFR below 45.  Recommend hold Jardiance.  Labs due recheck around 04/15/14. Patient called

## 2014-04-05 ENCOUNTER — Other Ambulatory Visit: Payer: Self-pay | Admitting: Family

## 2014-04-05 ENCOUNTER — Telehealth: Payer: Self-pay | Admitting: *Deleted

## 2014-04-05 NOTE — Telephone Encounter (Signed)
Received fax from pharmacy requesting amoxicilin 500mg . Take 4 capsules one hour prior to dental appt. Not on current med list. Please advise. Patient of Dr Jacelyn Grip

## 2014-04-05 NOTE — Telephone Encounter (Signed)
Amoxicillin is on pt's allergy list

## 2014-04-15 ENCOUNTER — Other Ambulatory Visit (INDEPENDENT_AMBULATORY_CARE_PROVIDER_SITE_OTHER): Payer: Medicare Other

## 2014-04-15 ENCOUNTER — Other Ambulatory Visit: Payer: Self-pay | Admitting: *Deleted

## 2014-04-15 DIAGNOSIS — E785 Hyperlipidemia, unspecified: Secondary | ICD-10-CM | POA: Diagnosis not present

## 2014-04-15 DIAGNOSIS — E119 Type 2 diabetes mellitus without complications: Secondary | ICD-10-CM

## 2014-04-15 LAB — POCT GLYCOSYLATED HEMOGLOBIN (HGB A1C): Hemoglobin A1C: 7.1

## 2014-04-16 LAB — CMP14+EGFR
A/G RATIO: 2 (ref 1.1–2.5)
ALK PHOS: 75 IU/L (ref 39–117)
ALT: 16 IU/L (ref 0–32)
AST: 22 IU/L (ref 0–40)
Albumin: 4 g/dL (ref 3.5–4.8)
BILIRUBIN TOTAL: 0.4 mg/dL (ref 0.0–1.2)
BUN/Creatinine Ratio: 26 (ref 11–26)
BUN: 29 mg/dL — ABNORMAL HIGH (ref 8–27)
CO2: 27 mmol/L (ref 18–29)
Calcium: 9.4 mg/dL (ref 8.7–10.3)
Chloride: 89 mmol/L — ABNORMAL LOW (ref 97–108)
Creatinine, Ser: 1.13 mg/dL — ABNORMAL HIGH (ref 0.57–1.00)
GFR, EST AFRICAN AMERICAN: 56 mL/min/{1.73_m2} — AB (ref >59)
GFR, EST NON AFRICAN AMERICAN: 48 mL/min/{1.73_m2} — AB (ref >59)
GLUCOSE: 97 mg/dL (ref 65–99)
Globulin, Total: 2 g/dL (ref 1.5–4.5)
Potassium: 4.5 mmol/L (ref 3.5–5.2)
SODIUM: 135 mmol/L (ref 134–144)
TOTAL PROTEIN: 6 g/dL (ref 6.0–8.5)

## 2014-04-16 LAB — LIPID PANEL
CHOLESTEROL TOTAL: 121 mg/dL (ref 100–199)
Chol/HDL Ratio: 1.8 ratio units (ref 0.0–4.4)
HDL: 66 mg/dL (ref >39)
LDL Calculated: 46 mg/dL (ref 0–99)
TRIGLYCERIDES: 47 mg/dL (ref 0–149)
VLDL CHOLESTEROL CAL: 9 mg/dL (ref 5–40)

## 2014-04-22 ENCOUNTER — Ambulatory Visit (INDEPENDENT_AMBULATORY_CARE_PROVIDER_SITE_OTHER): Payer: Medicare Other | Admitting: Family

## 2014-04-22 ENCOUNTER — Encounter: Payer: Self-pay | Admitting: Family

## 2014-04-22 VITALS — BP 121/63 | HR 73 | Temp 99.5°F | Ht 63.0 in | Wt 183.0 lb

## 2014-04-22 DIAGNOSIS — F3289 Other specified depressive episodes: Secondary | ICD-10-CM | POA: Diagnosis not present

## 2014-04-22 DIAGNOSIS — F329 Major depressive disorder, single episode, unspecified: Secondary | ICD-10-CM | POA: Diagnosis not present

## 2014-04-22 DIAGNOSIS — K219 Gastro-esophageal reflux disease without esophagitis: Secondary | ICD-10-CM | POA: Diagnosis not present

## 2014-04-22 DIAGNOSIS — E785 Hyperlipidemia, unspecified: Secondary | ICD-10-CM

## 2014-04-22 DIAGNOSIS — E118 Type 2 diabetes mellitus with unspecified complications: Secondary | ICD-10-CM

## 2014-04-22 DIAGNOSIS — M810 Age-related osteoporosis without current pathological fracture: Secondary | ICD-10-CM

## 2014-04-22 DIAGNOSIS — I10 Essential (primary) hypertension: Secondary | ICD-10-CM | POA: Diagnosis not present

## 2014-04-22 DIAGNOSIS — E138 Other specified diabetes mellitus with unspecified complications: Secondary | ICD-10-CM

## 2014-04-22 NOTE — Progress Notes (Signed)
Subjective:    Patient ID: [name], female    DOB: October 10, 1941, 73 y.o.   MRN: [id number]  Hyperlipidemia This is a chronic problem. The current episode started more than 1 year ago. The problem is controlled. Recent lipid tests were reviewed and are normal. Exacerbating diseases include diabetes and obesity. She has no history of hypothyroidism. Factors aggravating her hyperlipidemia include fatty foods. Pertinent negatives include no leg pain, myalgias or shortness of breath. Current antihyperlipidemic treatment includes statins. The current treatment provides moderate improvement of lipids. Risk factors for coronary artery disease include diabetes mellitus, dyslipidemia, family history, hypertension, obesity and post-menopausal.  Hypertension This is a chronic problem. The current episode started more than 1 year ago. The problem has been resolved since onset. The problem is controlled. Associated symptoms include anxiety. Pertinent negatives include no blurred vision, headaches, palpitations, peripheral edema or shortness of breath. Risk factors for coronary artery disease include diabetes mellitus, dyslipidemia, family history, obesity and post-menopausal state. Past treatments include diuretics and beta blockers. The current treatment provides moderate improvement. There is no history of kidney disease, CAD/MI, CVA or a thyroid problem. There is no history of sleep apnea.  Diabetes She presents for her follow-up diabetic visit. She has type 2 diabetes mellitus. Her disease course has been stable. Pertinent negatives for hypoglycemia include no headaches or nervousness/anxiousness. Pertinent negatives for diabetes include no blurred vision, no fatigue, no foot paresthesias, no foot ulcerations and no visual change. There are no hypoglycemic complications. Symptoms are stable. Pertinent negatives for diabetic complications include no CVA, heart disease or peripheral neuropathy. Risk factors for  coronary artery disease include diabetes mellitus, dyslipidemia, family history, hypertension and post-menopausal. Current diabetic treatment includes insulin injections. Her weight is stable. She is following a diabetic diet. Her breakfast blood glucose range is generally 140-180 mg/dl. Eye exam is current.  Gastrophageal Reflux She reports no belching, no choking, no coughing, no heartburn or no sore throat. This is a chronic problem. The current episode started more than 1 year ago. The problem has been resolved. The symptoms are aggravated by certain foods. Pertinent negatives include no fatigue or muscle weakness. She has tried a PPI for the symptoms. The treatment provided significant relief.  Anxiety Presents for follow-up visit. Symptoms include depressed mood. Patient reports no excessive worry, insomnia, irritability, nervous/anxious behavior, palpitations, shortness of breath or suicidal ideas. Symptoms occur rarely. The quality of sleep is good.   Her past medical history is significant for anxiety/panic attacks and depression. Past treatments include SSRIs. The treatment provided moderate relief. Compliance with prior treatments has been good.      Review of Systems  Constitutional: Negative.  Negative for irritability and fatigue.  HENT: Negative.  Negative for sore throat.   Eyes: Negative.  Negative for blurred vision.  Respiratory: Negative.  Negative for cough, choking and shortness of breath.   Cardiovascular: Negative for palpitations.  Gastrointestinal: Negative.  Negative for heartburn.  Genitourinary: Negative.   Musculoskeletal: Negative.  Negative for muscle weakness and myalgias.  Neurological: Negative for headaches.  Hematological: Negative.   Psychiatric/Behavioral: Negative.  Negative for suicidal ideas. The patient is not nervous/anxious and does not have insomnia.   All other systems reviewed and are negative.      Objective:   Physical Exam  Vitals  reviewed. Constitutional: She is oriented to person, place, and time. She appears well-developed and well-nourished. No distress.  HENT:  Head: Normocephalic and atraumatic.  Right Ear: External ear normal.  Mouth/Throat: Oropharynx is clear and moist.  Eyes: Pupils are equal, round, and reactive to light.  Neck: Normal range of motion. Neck supple. No thyromegaly present.  Cardiovascular: Normal rate, regular rhythm, normal heart sounds and intact distal pulses.   No murmur heard. Pulmonary/Chest: Effort normal and breath sounds normal. No respiratory distress. She has no wheezes.  Abdominal: Soft. Bowel sounds are normal. She exhibits no distension. There is no tenderness.  Musculoskeletal: Normal range of motion. She exhibits no edema and no tenderness.  Neurological: She is alert and oriented to person, place, and time. She has normal reflexes. No cranial nerve deficit.  Skin: Skin is warm and dry.  Psychiatric: She has a normal mood and affect. Her behavior is normal. Judgment and thought content normal.      BP 121/63  Pulse 73  Temp(Src) 99.5 F (37.5 C) (Oral)  Ht _0  (1.6 m)  Wt 183 lb (83.008 kg)  BMI 32.43 kg/m2     Assessment & Plan:  1. HYPERTENSION - CMP14+EGFR  2. Gastroesophageal reflux disease without esophagitis  3. HYPERLIPIDEMIA - Lipid panel  4. DEPRESSION  5. Other specified diabetes mellitus with unspecified complications - Hgb G3O slightly elevated- Pt to be on low carb diet and exercise -Keep appointment with Tammy  6. Osteoporosis, unspecified -Osteoporosis information given - Vit D  25 hydroxy (rtn osteoporosis monitoring)   Continue all meds Labs discussed and Vit D level drawn today Health Maintenance reviewed-Bone Density Scan discussed Diet and exercise encouraged RTO 3 months  Evelina Dun, FNP

## 2014-04-22 NOTE — Patient Instructions (Signed)
Health Maintenance, Female A healthy lifestyle and preventative care can promote health and wellness.  Maintain regular health, dental, and eye exams.  Eat a healthy diet. Foods like vegetables, fruits, whole grains, low-fat dairy products, and lean protein foods contain the nutrients you need without too many calories. Decrease your intake of foods high in solid fats, added sugars, and salt. Get information about a proper diet from your caregiver, if necessary.  Regular physical exercise is one of the most important things you can do for your health. Most adults should get at least 150 minutes of moderate-intensity exercise (any activity that increases your heart rate and causes you to sweat) each week. In addition, most adults need muscle-strengthening exercises on 2 or more days a week.   Maintain a healthy weight. The body mass index (BMI) is a screening tool to identify possible weight problems. It provides an estimate of body fat based on height and weight. Your caregiver can help determine your BMI, and can help you achieve or maintain a healthy weight. For adults 20 years and older:  A BMI below 18.5 is considered underweight.  A BMI of 18.5 to 24.9 is normal.  A BMI of 25 to 29.9 is considered overweight.  A BMI of 30 and above is considered obese.  Maintain normal blood lipids and cholesterol by exercising and minimizing your intake of saturated fat. Eat a balanced diet with plenty of fruits and vegetables. Blood tests for lipids and cholesterol should begin at age 41 and be repeated every 5 years. If your lipid or cholesterol levels are high, you are over 50, or you are a high risk for heart disease, you may need your cholesterol levels checked more frequently.Ongoing high lipid and cholesterol levels should be treated with medicines if diet and exercise are not effective.  If you smoke, find out from your caregiver how to quit. If you do not use tobacco, do not start.  Lung  cancer screening is recommended for adults aged 66-80 years who are at high risk for developing lung cancer because of a history of smoking. Yearly low-dose computed tomography (CT) is recommended for people who have at least a 30-pack-year history of smoking and are a current smoker or have quit within the past 15 years. A pack year of smoking is smoking an average of 1 pack of cigarettes a day for 1 year (for example: 1 pack a day for 30 years or 2 packs a day for 15 years). Yearly screening should continue until the smoker has stopped smoking for at least 15 years. Yearly screening should also be stopped for people who develop a health problem that would prevent them from having lung cancer treatment.  If you are pregnant, do not drink alcohol. If you are breastfeeding, be very cautious about drinking alcohol. If you are not pregnant and choose to drink alcohol, do not exceed 1 drink per day. One drink is considered to be 12 ounces (355 mL) of beer, 5 ounces (148 mL) of wine, or 1.5 ounces (44 mL) of liquor.  Avoid use of street drugs. Do not share needles with anyone. Ask for help if you need support or instructions about stopping the use of drugs.  High blood pressure causes heart disease and increases the risk of stroke. Blood pressure should be checked at least every 1 to 2 years. Ongoing high blood pressure should be treated with medicines, if weight loss and exercise are not effective.  If you are 55 to 74  years old, ask your caregiver if you should take aspirin to prevent strokes.  Diabetes screening involves taking a blood sample to check your fasting blood sugar level. This should be done once every 3 years, after age 45, if you are within normal weight and without risk factors for diabetes. Testing should be considered at a younger age or be carried out more frequently if you are overweight and have at least 1 risk factor for diabetes.  Breast cancer screening is essential preventative care  for women. You should practice "breast self-awareness." This means understanding the normal appearance and feel of your breasts and may include breast self-examination. Any changes detected, no matter how small, should be reported to a caregiver. Women in their 20s and 30s should have a clinical breast exam (CBE) by a caregiver as part of a regular health exam every 1 to 3 years. After age 40, women should have a CBE every year. Starting at age 40, women should consider having a mammogram (breast X-ray) every year. Women who have a family history of breast cancer should talk to their caregiver about genetic screening. Women at a high risk of breast cancer should talk to their caregiver about having an MRI and a mammogram every year.  Breast cancer gene (BRCA)-related cancer risk assessment is recommended for women who have family members with BRCA-related cancers. BRCA-related cancers include breast, ovarian, tubal, and peritoneal cancers. Having family members with these cancers may be associated with an increased risk for harmful changes (mutations) in the breast cancer genes BRCA1 and BRCA2. Results of the assessment will determine the need for genetic counseling and BRCA1 and BRCA2 testing.  The Pap test is a screening test for cervical cancer. Women should have a Pap test starting at age 21. Between ages 21 and 29, Pap tests should be repeated every 2 years. Beginning at age 30, you should have a Pap test every 3 years as long as the past 3 Pap tests have been normal. If you had a hysterectomy for a problem that was not cancer or a condition that could lead to cancer, then you no longer need Pap tests. If you are between ages 65 and 70, and you have had normal Pap tests going back 10 years, you no longer need Pap tests. If you have had past treatment for cervical cancer or a condition that could lead to cancer, you need Pap tests and screening for cancer for at least 20 years after your treatment. If Pap  tests have been discontinued, risk factors (such as a new sexual partner) need to be reassessed to determine if screening should be resumed. Some women have medical problems that increase the chance of getting cervical cancer. In these cases, your caregiver may recommend more frequent screening and Pap tests.  The human papillomavirus (HPV) test is an additional test that may be used for cervical cancer screening. The HPV test looks for the virus that can cause the cell changes on the cervix. The cells collected during the Pap test can be tested for HPV. The HPV test could be used to screen women aged 30 years and older, and should be used in women of any age who have unclear Pap test results. After the age of 30, women should have HPV testing at the same frequency as a Pap test.  Colorectal cancer can be detected and often prevented. Most routine colorectal cancer screening begins at the age of 50 and continues through age 75. However, your caregiver may   recommend screening at an earlier age if you have risk factors for colon cancer. On a yearly basis, your caregiver may provide home test kits to check for hidden blood in the stool. Use of a small camera at the end of a tube, to directly examine the colon (sigmoidoscopy or colonoscopy), can detect the earliest forms of colorectal cancer. Talk to your caregiver about this at age 12, when routine screening begins. Direct examination of the colon should be repeated every 5 to 10 years through age 60, unless early forms of pre-cancerous polyps or small growths are found.  Hepatitis C blood testing is recommended for all people born from [birth year] through 1966 and any individual with known risks for hepatitis C.  Practice safe sex. Use condoms and avoid high-risk sexual practices to reduce the spread of sexually transmitted infections (STIs). Sexually active women aged 64 and younger should be checked for Chlamydia, which is a common sexually transmitted infection.  Older women with new or multiple partners should also be tested for Chlamydia. Testing for other STIs is recommended if you are sexually active and at increased risk.  Osteoporosis is a disease in which the bones lose minerals and strength with aging. This can result in serious bone fractures. The risk of osteoporosis can be identified using a bone density scan. Women ages 68 and over and women at risk for fractures or osteoporosis should discuss screening with their caregivers. Ask your caregiver whether you should be taking a calcium supplement or vitamin D to reduce the rate of osteoporosis.  Menopause can be associated with physical symptoms and risks. Hormone replacement therapy is available to decrease symptoms and risks. You should talk to your caregiver about whether hormone replacement therapy is right for you.  Use sunscreen. Apply sunscreen liberally and repeatedly throughout the day. You should seek shade when your shadow is shorter than you. Protect yourself by wearing long sleeves, pants, a wide-brimmed hat, and sunglasses year round, whenever you are outdoors.  Notify your caregiver of new moles or changes in moles, especially if there is a change in shape or color. Also notify your caregiver if a mole is larger than the size of a pencil eraser.  Stay current with your immunizations. Document Released: 04/26/2011 Document Revised: 02/05/2013 Document Reviewed: 09/12/2013 [facility] Patient Information 2015 [place], [place]. This information is not intended to replace advice given to you by your health care provider. Make sure you discuss any questions you have with your health care provider. Osteoporosis Throughout your life, your body breaks down old bone and replaces it with new bone. As you get older, your body does not replace bone as quickly as it breaks it down. By the age of 30 years, most people begin to gradually lose bone because of the imbalance between bone loss and replacement.  Some people lose more bone than others. Bone loss beyond a specified normal degree is considered osteoporosis.  Osteoporosis affects the strength and durability of your bones. The inside of the ends of your bones and your flat bones, like the bones of your pelvis, look like honeycomb, filled with tiny open spaces. As bone loss occurs, your bones become less dense. This means that the open spaces inside your bones become bigger and the walls between these spaces become thinner. This makes your bones weaker. Bones of a person with osteoporosis can become so weak that they can break (fracture) during minor accidents, such as a simple fall. CAUSES  The following factors have been associated  with the development of osteoporosis:  Smoking.  Drinking more than 2 alcoholic drinks several days per week.  Long-term use of certain medicines:  Corticosteroids.  Chemotherapy medicines.  Thyroid medicines.  Antiepileptic medicines.  Gonadal hormone suppression medicine.  Immunosuppression medicine.  Being underweight.  Lack of physical activity.  Lack of exposure to the sun. This can lead to vitamin D deficiency.  Certain medical conditions:  Certain inflammatory bowel diseases, such as Crohn disease and ulcerative colitis.  Diabetes.  Hyperthyroidism.  Hyperparathyroidism. RISK FACTORS Anyone can develop osteoporosis. However, the following factors can increase your risk of developing osteoporosis:  Gender--Women are at higher risk than men.  Age--Being older than 50 years increases your risk.  Ethnicity--White and Asian people have an increased risk.  Weight --Being extremely underweight can increase your risk of osteoporosis.  Family history of osteoporosis--Having a family member who has developed osteoporosis can increase your risk. SYMPTOMS  Usually, people with osteoporosis have no symptoms.  DIAGNOSIS  Signs during a physical exam that may prompt your caregiver to  suspect osteoporosis include:  Decreased height. This is usually caused by the compression of the bones that form your spine (vertebrae) because they have weakened and become fractured.  A curving or rounding of the upper back (kyphosis). To confirm signs of osteoporosis, your caregiver may request a procedure that uses 2 low-dose X-ray beams with different levels of energy to measure your bone mineral density (dual-energy X-ray absorptiometry [DXA]). Also, your caregiver may check your level of vitamin D. TREATMENT  The goal of osteoporosis treatment is to strengthen bones in order to decrease the risk of bone fractures. There are different types of medicines available to help achieve this goal. Some of these medicines work by slowing the processes of bone loss. Some medicines work by increasing bone density. Treatment also involves making sure that your levels of calcium and vitamin D are adequate. PREVENTION  There are things you can do to help prevent osteoporosis. Adequate intake of calcium and vitamin D can help you achieve optimal bone mineral density. Regular exercise can also help, especially resistance and weight-bearing activities. If you smoke, quitting smoking is an important part of osteoporosis prevention. MAKE SURE YOU:  Understand these instructions.  Will watch your condition.  Will get help right away if you are not doing well or get worse. FOR MORE INFORMATION www.osteo.org and [employer].ee Document Released: 07/21/2005 Document Revised: 02/05/2013 Document Reviewed: 09/25/2011 [facility] Patient Information 2015 [place], [place]. This information is not intended to replace advice given to you by your health care provider. Make sure you discuss any questions you have with your health care provider.

## 2014-04-23 LAB — VITAMIN D 25 HYDROXY (VIT D DEFICIENCY, FRACTURES): Vit D, 25-Hydroxy: 60 ng/mL (ref 30.0–100.0)

## 2014-05-07 DIAGNOSIS — E1149 Type 2 diabetes mellitus with other diabetic neurological complication: Secondary | ICD-10-CM | POA: Diagnosis not present

## 2014-05-07 DIAGNOSIS — L851 Acquired keratosis [keratoderma] palmaris et plantaris: Secondary | ICD-10-CM | POA: Diagnosis not present

## 2014-05-07 DIAGNOSIS — B351 Tinea unguium: Secondary | ICD-10-CM | POA: Diagnosis not present

## 2014-05-16 ENCOUNTER — Telehealth: Payer: Self-pay | Admitting: Family

## 2014-05-17 MED ORDER — INSULIN ASPART 100 UNIT/ML FLEXPEN: PEN_INJECTOR | SUBCUTANEOUS | Status: DC

## 2014-05-17 NOTE — Telephone Encounter (Signed)
rx refilled, printed and left at front desk for patietn.  Patient notified.

## 2014-06-13 ENCOUNTER — Ambulatory Visit: Payer: Self-pay

## 2014-06-18 DIAGNOSIS — R197 Diarrhea, unspecified: Secondary | ICD-10-CM | POA: Diagnosis not present

## 2014-06-18 DIAGNOSIS — K219 Gastro-esophageal reflux disease without esophagitis: Secondary | ICD-10-CM | POA: Diagnosis not present

## 2014-06-20 ENCOUNTER — Other Ambulatory Visit: Payer: Self-pay | Admitting: Pharmacist

## 2014-06-20 DIAGNOSIS — F329 Major depressive disorder, single episode, unspecified: Secondary | ICD-10-CM

## 2014-06-20 DIAGNOSIS — E1140 Type 2 diabetes mellitus with diabetic neuropathy, unspecified: Secondary | ICD-10-CM

## 2014-06-20 DIAGNOSIS — E1143 Type 2 diabetes mellitus with diabetic autonomic (poly)neuropathy: Secondary | ICD-10-CM

## 2014-06-20 DIAGNOSIS — F32A Depression, unspecified: Secondary | ICD-10-CM

## 2014-06-20 DIAGNOSIS — G4700 Insomnia, unspecified: Secondary | ICD-10-CM

## 2014-06-20 MED ORDER — INSULIN PEN NEEDLE 30G X 8 MM MISC: Status: DC

## 2014-06-20 MED ORDER — ATORVASTATIN CALCIUM 20 MG PO TABS: 10.0000 mg | ORAL_TABLET | Freq: Every day | ORAL | Status: DC

## 2014-06-20 MED ORDER — INSULIN ASPART 100 UNIT/ML FLEXPEN: PEN_INJECTOR | SUBCUTANEOUS | Status: DC

## 2014-06-20 MED ORDER — INSULIN GLARGINE 100 UNIT/ML SOLOSTAR PEN: 37.0000 [IU] | PEN_INJECTOR | Freq: Every evening | SUBCUTANEOUS | Status: DC

## 2014-06-20 MED ORDER — TRAZODONE HCL 150 MG PO TABS: 150.0000 mg | ORAL_TABLET | Freq: Every day | ORAL | Status: DC

## 2014-06-20 MED ORDER — METOPROLOL SUCCINATE ER 25 MG PO TB24: 12.5000 mg | ORAL_TABLET | Freq: Every day | ORAL | Status: DC

## 2014-06-20 NOTE — Telephone Encounter (Signed)
Received letter from patient that was dropped off at front desk requesting refills for several medications.  She requested 3 month supply and for them to be mailed to her so that she can send to her mail order pharmacy.

## 2014-06-20 NOTE — Telephone Encounter (Signed)
Patient notified Rx's at front desk.

## 2014-07-04 ENCOUNTER — Encounter: Payer: Self-pay | Admitting: Pharmacist

## 2014-07-04 ENCOUNTER — Ambulatory Visit (INDEPENDENT_AMBULATORY_CARE_PROVIDER_SITE_OTHER): Payer: Medicare Other | Admitting: Pharmacist

## 2014-07-04 VITALS — BP 118/68 | HR 76 | Ht 63.0 in | Wt 183.5 lb

## 2014-07-04 DIAGNOSIS — E119 Type 2 diabetes mellitus without complications: Secondary | ICD-10-CM | POA: Diagnosis not present

## 2014-07-04 DIAGNOSIS — Z794 Long term (current) use of insulin: Secondary | ICD-10-CM

## 2014-07-04 DIAGNOSIS — E663 Overweight: Secondary | ICD-10-CM | POA: Insufficient documentation

## 2014-07-04 DIAGNOSIS — E669 Obesity, unspecified: Secondary | ICD-10-CM

## 2014-07-04 DIAGNOSIS — E785 Hyperlipidemia, unspecified: Secondary | ICD-10-CM

## 2014-07-04 NOTE — Patient Instructions (Addendum)
Increase Lantus to 35 units once a day  Continue current Novolog sliding scale   Blood glucose less than 80 - no insulin      80 to 120 - 5 units      121 to 150 - 6 units      151 to 200 - 7 units      201 to 250 - 8 units      250 or above - 9 units and call office  Diabetes and Standards of Medical Care  Diabetes is complicated. You may find that your diabetes team includes a dietitian, nurse, diabetes educator, eye doctor, and more. To help everyone know what is going on and to help you get the care you deserve, the following schedule of care was developed to help keep you on track. Below are the tests, exams, vaccines, medicines, education, and plans you will need.  Blood Glucose Goals Prior to meals = 80 - 130 Within 2 hours of the start of a meal = less than 180  HbA1c test (goal is less than 7.0% - your last value was 7.1%) This test shows how well you have controlled your glucose over the past 2 3 months. It is used to see if your diabetes management plan needs to be adjusted.   It is performed at least 2 times a year if you are meeting treatment goals.  It is performed 4 times a year if therapy has changed or if you are not meeting treatment goals.   Blood pressure test  This test is performed at every routine medical visit. The goal is less than 140/90 mmHg for most people, but 130/80 mmHg in some cases. Ask your health care provider about your goal. Dental exam  Follow up with the dentist regularly. Eye exam  If you are diagnosed with type 1 diabetes as a child, get an exam upon reaching the age of 23 years or older and have had diabetes for 3 5 years. Yearly eye exams are recommended after that initial eye exam.  If you are diagnosed with type 1 diabetes as an adult, get an exam within 5 years of diagnosis and then yearly.  If you are diagnosed with type 2 diabetes, get an exam as soon as possible after the diagnosis and then yearly. Foot care exam  Visual foot  exams are performed at every routine medical visit. The exams check for cuts, injuries, or other problems with the feet.  A comprehensive foot exam should be done yearly. This includes visual inspection as well as assessing foot pulses and testing for loss of sensation.  Check your feet nightly for cuts, injuries, or other problems with your feet. Tell your health care provider if anything is not healing. Kidney function test (urine microalbumin)  This test is performed once a year.  Type 1 diabetes: The first test is performed 5 years after diagnosis.  Type 2 diabetes: The first test is performed at the time of diagnosis.  A serum creatinine and estimated glomerular filtration rate (eGFR) test is done once a year to assess the level of chronic kidney disease (CKD), if present. Lipid profile (cholesterol, HDL, LDL, triglycerides)  Performed every 5 years for most people.  The goal for LDL is less than 100 mg/dL. If you are at high risk, the goal is less than 70 mg/dL.  The goal for HDL is 40 mg/dL 50 mg/dL for men and 50 mg/dL 60 mg/dL for women. An HDL cholesterol of 60 mg/dL  or higher gives some protection against heart disease.  The goal for triglycerides is less than 150 mg/dL. Influenza vaccine, pneumococcal vaccine, and hepatitis B vaccine  The influenza vaccine is recommended yearly.  The pneumococcal vaccine is generally given once in a lifetime. However, there are some instances when another vaccination is recommended. Check with your health care provider.  The hepatitis B vaccine is also recommended for adults with diabetes. Diabetes self-management education  Education is recommended at diagnosis and ongoing as needed. Treatment plan  Your treatment plan is reviewed at every medical visit. Document Released: 08/08/2009 Document Revised: 06/13/2013 Document Reviewed: 03/13/2013 [facility] Patient Information 2014 [place].

## 2014-07-04 NOTE — Progress Notes (Addendum)
Diabetes Follow-Up Visit Chief Complaint:   Chief Complaint  Patient presents with  . Diabetes     Filed Vitals:   07/04/14 1028  BP: 118/68  Pulse: 76   Filed Weights   07/04/14 1028  Weight: 183 lb 8 oz (83.235 kg)    HPI:  Diabetes: patient has long standing diabetes.  Her most recent A1C was 7.1%.  Over the last 2-3 months we have been adjusting her diabetes medications to try to get less fluctuation in her HBG readings.  Current medications for diabetes include -  Lantus 32 units daily and Novolog sliding scale (BG less than 80 - no insulin, 80 to 120 - 5 units, 121 to 150 - 6 units, 151 to 200 - 7 units, 201 to 250 - 8 units, 250 or above - 9 units and call office) Patient has tried metformin in past but stopped because she had low energy.  Once she stopped metformin her energy level improved but she is unsure if it really was metformin or not. She also was taking Jardiance but we stopped due to decrease in GFR to around 30.  Polyuria:  negative  Polydipsia:  negative Polyphagia:  negatvie  BMI:  Body mass index is 32.51 kg/(m^2).   Weight changes:  Stable currently though over the last year she has lost about 11# and 21# over the last 2 years. General Appearance:  alert, oriented, no acute distress and obese Mood/Affect:  normal  Diet - Continues to try to limit calorie or CHO intake.  Exercise:  Walking 1-2 times per week  Home BG Monitoring:  Checking 3 to 5 times a day. Average:  139 in am, 171 at lunch, 166 at dinner and 150 at bedtime  High: 244 Low:  89    Lab Results  Component Value Date   HGBA1C 7.1 04/15/2014      Lab Results  Component Value Date   CHOL 107 08/09/2013   HDL 66 04/15/2014   LDLCALC 46 04/15/2014   TRIG 47 04/15/2014   CHOLHDL 1.8 04/15/2014    In House Urine Microalbumin was negative (08/09/2013)   Assessment: 1.  Diabetes.  Fluctuations in BG improved / less and  A1c improved though slightly above goal  2.  Blood Pressure.   controlled 3.  Lipids.  Controlled 4.  Obesity - patient has lost 21# since 03/23/2013    Recommendations: 1.  Medication recommendations at this time are as follows:    Increase Lantus to 35 units daily  Continue Novolog sliding scale   BG less than 80 - no insulin    80 to 120 - 5 units    121 to 150 - 6 units    151 to 200 - 7 units    201 to 250 - 8 units    250 or above - 9 units and call office     2.  Reviewed HBG goals:  Fasting 80-130 and 1-2 hour post prandial <180.  Patient is instructed to check BG 3 to 5 times per day.    3.  BP goal < 140/80. 4.  LDL goal of < 100, HDL > 40 and TG < 150. 5.  Eye Exam yearly and Dental Exam every 6 months. 6.  RTC in 2 months to see [facility] and 2 weeks for labs with PCP  Cherre Robins, PharmD, CPP       Addendum - patient mentions increased dry mouth which I think might be related to recent  increase in lomotil from prn to qd by Dr Amedeo Plenty / GI.  I instructed patient to try taking lomotil every other day and see if dry mouth improves.  She is to call office if it does not improve or if increased loose BM's.

## 2014-07-24 ENCOUNTER — Encounter: Payer: Self-pay | Admitting: Family

## 2014-07-24 ENCOUNTER — Ambulatory Visit (INDEPENDENT_AMBULATORY_CARE_PROVIDER_SITE_OTHER): Payer: Medicare Other | Admitting: Family

## 2014-07-24 VITALS — BP 127/58 | HR 77 | Temp 97.6°F | Ht 63.0 in | Wt 184.0 lb

## 2014-07-24 DIAGNOSIS — I10 Essential (primary) hypertension: Secondary | ICD-10-CM

## 2014-07-24 DIAGNOSIS — Z794 Long term (current) use of insulin: Secondary | ICD-10-CM | POA: Diagnosis not present

## 2014-07-24 DIAGNOSIS — F3289 Other specified depressive episodes: Secondary | ICD-10-CM

## 2014-07-24 DIAGNOSIS — M810 Age-related osteoporosis without current pathological fracture: Secondary | ICD-10-CM | POA: Diagnosis not present

## 2014-07-24 DIAGNOSIS — E119 Type 2 diabetes mellitus without complications: Secondary | ICD-10-CM | POA: Diagnosis not present

## 2014-07-24 DIAGNOSIS — E785 Hyperlipidemia, unspecified: Secondary | ICD-10-CM | POA: Diagnosis not present

## 2014-07-24 DIAGNOSIS — R5381 Other malaise: Secondary | ICD-10-CM

## 2014-07-24 DIAGNOSIS — K219 Gastro-esophageal reflux disease without esophagitis: Secondary | ICD-10-CM

## 2014-07-24 DIAGNOSIS — Z23 Encounter for immunization: Secondary | ICD-10-CM | POA: Diagnosis not present

## 2014-07-24 DIAGNOSIS — R32 Unspecified urinary incontinence: Secondary | ICD-10-CM | POA: Diagnosis not present

## 2014-07-24 DIAGNOSIS — G4700 Insomnia, unspecified: Secondary | ICD-10-CM

## 2014-07-24 DIAGNOSIS — F329 Major depressive disorder, single episode, unspecified: Secondary | ICD-10-CM

## 2014-07-24 DIAGNOSIS — R5383 Other fatigue: Secondary | ICD-10-CM

## 2014-07-24 DIAGNOSIS — Z1321 Encounter for screening for nutritional disorder: Secondary | ICD-10-CM

## 2014-07-24 LAB — POCT GLYCOSYLATED HEMOGLOBIN (HGB A1C): Hemoglobin A1C: 6.9

## 2014-07-24 NOTE — Progress Notes (Signed)
Subjective:    Patient ID: [name], female    DOB: Nov 20, 1940, 73 y.o.   MRN: [id number]  Diabetes She presents for her follow-up diabetic visit. She has type 2 diabetes mellitus. Her disease course has been stable. Pertinent negatives for hypoglycemia include no headaches or nervousness/anxiousness. Pertinent negatives for diabetes include no blurred vision, no fatigue, no foot paresthesias, no foot ulcerations and no visual change. There are no hypoglycemic complications. Symptoms are stable. Diabetic complications include peripheral neuropathy. Pertinent negatives for diabetic complications include no CVA or heart disease. Risk factors for coronary artery disease include diabetes mellitus, dyslipidemia, family history, hypertension and post-menopausal. Current diabetic treatment includes insulin injections. Her weight is stable. She is following a diabetic diet. Her breakfast blood glucose range is generally 140-180 mg/dl. An ACE inhibitor/angiotensin II receptor blocker is not being taken. Eye exam is current.  Hyperlipidemia This is a chronic problem. The current episode started more than 1 year ago. The problem is controlled. Recent lipid tests were reviewed and are normal. Exacerbating diseases include diabetes and obesity. She has no history of hypothyroidism. Factors aggravating her hyperlipidemia include fatty foods. Pertinent negatives include no leg pain, myalgias or shortness of breath. Current antihyperlipidemic treatment includes statins. The current treatment provides moderate improvement of lipids. Risk factors for coronary artery disease include diabetes mellitus, dyslipidemia, family history, hypertension, obesity and post-menopausal.  Hypertension This is a chronic problem. The current episode started more than 1 year ago. The problem has been resolved since onset. The problem is controlled. Associated symptoms include anxiety. Pertinent negatives include no blurred vision,  headaches, palpitations, peripheral edema or shortness of breath. Risk factors for coronary artery disease include diabetes mellitus, dyslipidemia, family history, obesity and post-menopausal state. Past treatments include diuretics and beta blockers. The current treatment provides moderate improvement. There is no history of kidney disease, CAD/MI, CVA or a thyroid problem. There is no history of sleep apnea.  Gastrophageal Reflux She reports no belching, no choking, no coughing, no heartburn or no sore throat. This is a chronic problem. The current episode started more than 1 year ago. The problem has been resolved. The symptoms are aggravated by certain foods. Pertinent negatives include no fatigue or muscle weakness. She has tried a PPI for the symptoms. The treatment provided significant relief.  Anxiety Presents for follow-up visit. Onset was more than 5 years ago. The problem has been waxing and waning. Symptoms include depressed mood. Patient reports no excessive worry, insomnia, irritability, nervous/anxious behavior, palpitations, shortness of breath or suicidal ideas. Symptoms occur rarely. The quality of sleep is good.   Her past medical history is significant for anxiety/panic attacks and depression. Past treatments include SSRIs. The treatment provided moderate relief. Compliance with prior treatments has been good.      Review of Systems  Constitutional: Negative.  Negative for irritability and fatigue.  HENT: Negative.  Negative for sore throat.   Eyes: Negative.  Negative for blurred vision.  Respiratory: Negative.  Negative for cough, choking and shortness of breath.   Cardiovascular: Negative.  Negative for palpitations.  Gastrointestinal: Negative.  Negative for heartburn.  Endocrine: Negative.   Genitourinary: Negative.   Musculoskeletal: Negative.  Negative for muscle weakness and myalgias.  Neurological: Negative.  Negative for headaches.  Hematological: Negative.     Psychiatric/Behavioral: Negative.  Negative for suicidal ideas. The patient is not nervous/anxious and does not have insomnia.   All other systems reviewed and are negative.      Objective:  Physical Exam  Vitals reviewed. Constitutional: She is oriented to person, place, and time. She appears well-developed and well-nourished. No distress.  HENT:  Head: Normocephalic and atraumatic.  Right Ear: External ear normal.  Left Ear: External ear normal.  Nose: Nose normal.  Mouth/Throat: Oropharynx is clear and moist.  Eyes: Pupils are equal, round, and reactive to light.  Neck: Normal range of motion. Neck supple. No thyromegaly present.  Cardiovascular: Normal rate, regular rhythm, normal heart sounds and intact distal pulses.   No murmur heard. Pulmonary/Chest: Effort normal and breath sounds normal. No respiratory distress. She has no wheezes.  Abdominal: Soft. Bowel sounds are normal. She exhibits no distension. There is no tenderness.  Musculoskeletal: Normal range of motion. She exhibits no edema and no tenderness.  Neurological: She is alert and oriented to person, place, and time. She has normal reflexes. No cranial nerve deficit.  Skin: Skin is warm and dry.  Psychiatric: She has a normal mood and affect. Her behavior is normal. Judgment and thought content normal.    BP 127/58  Pulse 77  Temp(Src) 97.6 F (36.4 C) (Oral)  Ht 5' 3"  (1.6 m)  Wt 184 lb (83.462 kg)  BMI 32.60 kg/m2       Assessment & Plan:  1. HYPERTENSION - CMP14+EGFR  2. Gastroesophageal reflux disease without esophagitis - CMP14+EGFR  3. UNSPECIFIED OSTEOPOROSIS - CMP14+EGFR  4. Type 2 diabetes mellitus with insulin therapy - POCT glycosylated hemoglobin (Hb A1C) - CMP14+EGFR  5. HYPERLIPIDEMIA - CMP14+EGFR  6. DEPRESSION - CMP14+EGFR  7. URINARY INCONTINENCE  - CMP14+EGFR  8. Insomnia - CMP14+EGFR  9. Other malaise and fatigue - Vit D  25 hydroxy (rtn osteoporosis  monitoring) - Thyroid Panel With TSH  10. Encounter for vitamin deficiency screening - Vit D  25 hydroxy (rtn osteoporosis monitoring)    Continue all meds Labs pending Health Maintenance reviewed- Flu shot given today Diet and exercise encouraged RTO 3 months and pt has appt with Tammy in Nov  Evelina Dun, FNP

## 2014-07-24 NOTE — Patient Instructions (Signed)

## 2014-07-25 LAB — CMP14+EGFR
ALBUMIN: 3.8 g/dL (ref 3.5–4.8)
ALK PHOS: 65 IU/L (ref 39–117)
ALT: 15 IU/L (ref 0–32)
AST: 21 IU/L (ref 0–40)
Albumin/Globulin Ratio: 1.9 (ref 1.1–2.5)
BILIRUBIN TOTAL: 0.5 mg/dL (ref 0.0–1.2)
BUN / CREAT RATIO: 19 (ref 11–26)
BUN: 22 mg/dL (ref 8–27)
CO2: 25 mmol/L (ref 18–29)
Calcium: 8.9 mg/dL (ref 8.7–10.3)
Chloride: 91 mmol/L — ABNORMAL LOW (ref 97–108)
Creatinine, Ser: 1.17 mg/dL — ABNORMAL HIGH (ref 0.57–1.00)
GFR calc non Af Amer: 46 mL/min/{1.73_m2} — ABNORMAL LOW (ref >59)
GFR, EST AFRICAN AMERICAN: 53 mL/min/{1.73_m2} — AB (ref >59)
Globulin, Total: 2 g/dL (ref 1.5–4.5)
Glucose: 216 mg/dL — ABNORMAL HIGH (ref 65–99)
POTASSIUM: 4.3 mmol/L (ref 3.5–5.2)
Sodium: 134 mmol/L (ref 134–144)
TOTAL PROTEIN: 5.8 g/dL — AB (ref 6.0–8.5)

## 2014-07-25 LAB — THYROID PANEL WITH TSH
FREE THYROXINE INDEX: 1.7 (ref 1.2–4.9)
T3 UPTAKE RATIO: 34 % (ref 24–39)
T4, Total: 5.1 ug/dL (ref 4.5–12.0)
TSH: 2.57 u[IU]/mL (ref 0.450–4.500)

## 2014-07-25 LAB — VITAMIN D 25 HYDROXY (VIT D DEFICIENCY, FRACTURES): Vit D, 25-Hydroxy: 56 ng/mL (ref 30.0–100.0)

## 2014-07-26 ENCOUNTER — Telehealth: Payer: Self-pay | Admitting: Family Medicine

## 2014-07-26 DIAGNOSIS — R300 Dysuria: Secondary | ICD-10-CM

## 2014-07-26 NOTE — Telephone Encounter (Signed)
Pt is aware of lab results but says her urine is dark and wants to bring urine specimen in to test.  Call Izora Gala at [phone] to let her know if she can.

## 2014-07-26 NOTE — Telephone Encounter (Signed)
Message copied by Waverly Ferrari on Fri Jul 26, 2014  1:50 PM ------      Message from: [place], [place] A      Created: Fri Jul 26, 2014 10:06 AM       HgbA1C WNL      Kidney and liver function stable      Vit D levels WNL      Thyroid levels WNL       ------

## 2014-07-26 NOTE — Telephone Encounter (Signed)
Patient aware.

## 2014-07-26 NOTE — Telephone Encounter (Signed)
Orders in EPIC

## 2014-07-30 ENCOUNTER — Telehealth: Payer: Self-pay | Admitting: *Deleted

## 2014-07-30 ENCOUNTER — Other Ambulatory Visit (INDEPENDENT_AMBULATORY_CARE_PROVIDER_SITE_OTHER): Payer: Medicare Other

## 2014-07-30 DIAGNOSIS — N390 Urinary tract infection, site not specified: Secondary | ICD-10-CM

## 2014-07-30 LAB — POCT UA - MICROSCOPIC ONLY
Bacteria, U Microscopic: NEGATIVE
Casts, Ur, LPF, POC: NEGATIVE
Crystals, Ur, HPF, POC: NEGATIVE
Mucus, UA: NEGATIVE
Yeast, UA: NEGATIVE

## 2014-07-30 LAB — POCT URINALYSIS DIPSTICK
Bilirubin, UA: NEGATIVE
Glucose, UA: NEGATIVE
Ketones, UA: NEGATIVE
NITRITE UA: NEGATIVE
Protein, UA: NEGATIVE
Spec Grav, UA: <=1.005
UROBILINOGEN UA: NEGATIVE
pH, UA: 6.5

## 2014-07-30 MED ORDER — CIPROFLOXACIN HCL 250 MG PO TABS: 250.0000 mg | ORAL_TABLET | Freq: Two times a day (BID) | ORAL | Status: DC

## 2014-07-30 NOTE — Telephone Encounter (Signed)
Message copied by Marin Olp on Tue Jul 30, 2014  6:43 PM ------      Message from: Chipper Herb      Created: Tue Jul 30, 2014  5:22 PM       The urinalysis had 5-10 WBC and 1-5 RBC. Is the patient symptomatic?      Start Cipro 250 twice daily for 7 days and if culture and sensitivity indicates a different antibiotic we will change this when that result comes ------

## 2014-07-30 NOTE — Telephone Encounter (Signed)
Pt notified of results from UA Rx for Cipro sent into pharmacy per Dr Laurance Flatten Pt will repeat UA after completion of antibiotics Verbalizes understanding

## 2014-08-01 LAB — URINE CULTURE

## 2014-08-07 ENCOUNTER — Telehealth: Payer: Self-pay | Admitting: Family Medicine

## 2014-08-07 DIAGNOSIS — R197 Diarrhea, unspecified: Secondary | ICD-10-CM | POA: Diagnosis not present

## 2014-08-07 NOTE — Telephone Encounter (Signed)
Message copied by Waverly Ferrari on Wed Aug 07, 2014 11:16 AM ------      Message from: Chipper Herb      Created: Thu Aug 01, 2014  2:02 PM       The culture report indicates that the bacteria is sensitive to Cipro which she has been prescribed. Please continue this antibiotic until completed and recheck a urinalysis upon completion ------

## 2014-08-09 ENCOUNTER — Other Ambulatory Visit (INDEPENDENT_AMBULATORY_CARE_PROVIDER_SITE_OTHER): Payer: Medicare Other

## 2014-08-09 DIAGNOSIS — R300 Dysuria: Secondary | ICD-10-CM | POA: Diagnosis not present

## 2014-08-09 LAB — POCT UA - MICROSCOPIC ONLY
Bacteria, U Microscopic: NEGATIVE
Casts, Ur, LPF, POC: NEGATIVE
Crystals, Ur, HPF, POC: NEGATIVE
Mucus, UA: NEGATIVE
RBC, URINE, MICROSCOPIC: NEGATIVE
YEAST UA: NEGATIVE

## 2014-08-09 LAB — POCT URINALYSIS DIPSTICK
Bilirubin, UA: NEGATIVE
Blood, UA: NEGATIVE
Glucose, UA: NEGATIVE
Ketones, UA: NEGATIVE
Nitrite, UA: NEGATIVE
Protein, UA: NEGATIVE
Spec Grav, UA: <=1.005
Urobilinogen, UA: NEGATIVE
pH, UA: 7.5

## 2014-08-09 NOTE — Progress Notes (Signed)
Lab only 

## 2014-08-12 ENCOUNTER — Other Ambulatory Visit: Payer: Self-pay | Admitting: Nurse Practitioner

## 2014-08-12 ENCOUNTER — Telehealth: Payer: Self-pay | Admitting: Family

## 2014-08-12 MED ORDER — DOXYCYCLINE HYCLATE 100 MG PO TABS: 100.0000 mg | ORAL_TABLET | Freq: Two times a day (BID) | ORAL | Status: DC

## 2014-08-12 NOTE — Telephone Encounter (Signed)
Pt wants urine test results. Can you review labs please.

## 2014-08-12 NOTE — Telephone Encounter (Signed)
uti- rx sent to pharmacy

## 2014-08-13 DIAGNOSIS — B351 Tinea unguium: Secondary | ICD-10-CM | POA: Diagnosis not present

## 2014-08-13 DIAGNOSIS — L84 Corns and callosities: Secondary | ICD-10-CM | POA: Diagnosis not present

## 2014-08-13 DIAGNOSIS — E1142 Type 2 diabetes mellitus with diabetic polyneuropathy: Secondary | ICD-10-CM | POA: Diagnosis not present

## 2014-08-13 NOTE — Telephone Encounter (Signed)
Left message

## 2014-08-13 NOTE — Telephone Encounter (Signed)
Patient aware.

## 2014-08-26 ENCOUNTER — Ambulatory Visit (INDEPENDENT_AMBULATORY_CARE_PROVIDER_SITE_OTHER): Payer: Medicare Other | Admitting: Pharmacist

## 2014-08-26 ENCOUNTER — Encounter: Payer: Self-pay | Admitting: Pharmacist

## 2014-08-26 VITALS — BP 136/80 | HR 68 | Ht 64.0 in | Wt 179.0 lb

## 2014-08-26 DIAGNOSIS — E1143 Type 2 diabetes mellitus with diabetic autonomic (poly)neuropathy: Secondary | ICD-10-CM

## 2014-08-26 DIAGNOSIS — Z794 Long term (current) use of insulin: Secondary | ICD-10-CM

## 2014-08-26 DIAGNOSIS — E1169 Type 2 diabetes mellitus with other specified complication: Secondary | ICD-10-CM

## 2014-08-26 DIAGNOSIS — I10 Essential (primary) hypertension: Secondary | ICD-10-CM | POA: Diagnosis not present

## 2014-08-26 DIAGNOSIS — E785 Hyperlipidemia, unspecified: Secondary | ICD-10-CM | POA: Diagnosis not present

## 2014-08-26 DIAGNOSIS — E119 Type 2 diabetes mellitus without complications: Secondary | ICD-10-CM

## 2014-08-26 DIAGNOSIS — N390 Urinary tract infection, site not specified: Secondary | ICD-10-CM

## 2014-08-26 DIAGNOSIS — E669 Obesity, unspecified: Secondary | ICD-10-CM

## 2014-08-26 LAB — POCT URINALYSIS DIPSTICK
BILIRUBIN UA: NEGATIVE
GLUCOSE UA: NEGATIVE
Ketones, UA: NEGATIVE
Leukocytes, UA: NEGATIVE
Nitrite, UA: NEGATIVE
Protein, UA: NEGATIVE
Urobilinogen, UA: NEGATIVE
pH, UA: 7

## 2014-08-26 LAB — POCT UA - MICROSCOPIC ONLY
CASTS, UR, LPF, POC: NEGATIVE
Crystals, Ur, HPF, POC: NEGATIVE
Mucus, UA: NEGATIVE
Yeast, UA: NEGATIVE

## 2014-08-26 MED ORDER — TRAZODONE HCL 50 MG PO TABS: ORAL_TABLET | ORAL | Status: DC

## 2014-08-26 NOTE — Progress Notes (Addendum)
Diabetes Follow-Up Visit Chief Complaint:   Chief Complaint  Patient presents with  . Diabetes     Filed Vitals:   08/26/14 2230  BP: 136/80  Pulse: 68   Filed Weights   08/26/14 2230  Weight: 179 lb (81.194 kg)    HPI:  Diabetes: patient has long standing diabetes.  Her most recent A1C was 6.9%.  Over the last 2-3 months we have been adjusting her diabetes medications to try to get less fluctuation in her HBG readings.  Current medications for diabetes include -  Lantus 32 units daily and Novolog sliding scale (BG less than 80 - no insulin, 80 to 120 - 5 units, 121 to 150 - 6 units, 151 to 200 - 7 units, 201 to 250 - 8 units, 250 or above - 9 units and call office) Patient has tried metformin in past but stopped because she had low energy.  Once she stopped metformin her energy level improved but she is unsure if it really was metformin or not. She also was taking Jardiance but we stopped due to decrease in GFR to around 30.  UTI:  She has taken 2 rounds of ABX for UTI over the last 30 days and also take nitrofurantoin 100mg  QD for suppressive therapy.  Patient asks to have urine rechecked today.  She denies dysuria or changes in urine color.   Medication Management:  Patient expresses want to decrease the number of medications she is taking.  She also states that she has stopped driving to [place] or long distances because she often feels sleepy during the day.  Polyuria:  negative  Polydipsia:  negative Polyphagia:  negatvie  BMI:  Body mass index is 30.71 kg/(m^2).   Weight Decreased by 6# over last 3 weeks.   She has lost about 27# over the last 2 years. General Appearance:  alert, oriented, no acute distress and obese Mood/Affect:  normal  Diet - Continues to try to limit calorie or CHO intake.  Exercise:  Walking 1-2 times per week  Home BG Monitoring:  Checking 3 to 5 times a day. Average:  175 in am, 167 at lunch, 181 at dinner and 167 at bedtime  High: 294 Low:   86    Lab Results  Component Value Date   HGBA1C 6.9 07/24/2014      Lab Results  Component Value Date   CHOL 107 08/09/2013   HDL 66 04/15/2014   LDLCALC 46 04/15/2014   TRIG 47 04/15/2014   CHOLHDL 1.8 04/15/2014    In House Urine Microalbumin was negative (08/09/2013)  Estimated serum creatinine 36.43 (07/25/2014)  Assessment: 1.  Diabetes.  Fluctuations in BG improved / less and  A1c improved now below goal 2.  Blood Pressure.  controlled 3.  Lipids.  Controlled 4.  Obesity - patient has lost 27# since 03/23/2013 5.  UTI - UA shows few bacteria and trace blood 6.  Medication Management    Recommendations: 1.  Medication recommendations at this time are as follows:    Increase Lantus to 40 units daily  Continue Novolog sliding scale   BG less than 80 - no insulin    80 to 120 - 5 units    121 to 150 - 6 units    151 to 200 - 7 units    201 to 250 - 8 units    250 or above - 9 units and call office  Change trazodone to 50mg  take 1 tablet qhs for 1  week, then 1/2 tablet qhs for 1 week, then discontinue     2.  Sent UA for culture and sensitivity.  Plan to contact patient's urologist Dr McDimarid regarding treatment of UTI.  Consider alternative to nitrofurantoin as effectiveness decreases at GFR less than 43mL/min and increased pulmonary side effects. 3.  Reviewed HBG goals:  Fasting 80-130 and 1-2 hour post prandial <180.  Patient is instructed to check BG 3 to 5 times per day.    4.  BP goal < 140/80. 5.  LDL goal of < 100, HDL > 40 and TG < 150. 6.  Eye Exam yearly and Dental Exam every 6 months. 7.  RTC in 1 month to see clinical pharmacist  Cherre Robins, PharmD, CPP

## 2014-08-26 NOTE — Patient Instructions (Signed)
Increase Lantus to 40 units once a day   Diabetes and Standards of Medical Care   Diabetes is complicated. You may find that your diabetes team includes a dietitian, nurse, diabetes educator, eye doctor, and more. To help everyone know what is going on and to help you get the care you deserve, the following schedule of care was developed to help keep you on track. Below are the tests, exams, vaccines, medicines, education, and plans you will need.  Blood Glucose Goals Prior to meals = 80 - 130 Within 2 hours of the start of a meal = less than 180  HbA1c test (goal is less than 7.0% - your last value was 6.9%) This test shows how well you have controlled your glucose over the past 2 to 3 months. It is used to see if your diabetes management plan needs to be adjusted.   It is performed at least 2 times a year if you are meeting treatment goals.  It is performed 4 times a year if therapy has changed or if you are not meeting treatment goals.  Blood pressure test  This test is performed at every routine medical visit. The goal is less than 140/90 mmHg for most people, but 130/80 mmHg in some cases. Ask your health care provider about your goal.  Dental exam  Follow up with the dentist regularly.  Eye exam  If you are diagnosed with type 1 diabetes as a child, get an exam upon reaching the age of 10 years or older and have had diabetes for 3 to 5 years. Yearly eye exams are recommended after that initial eye exam.  If you are diagnosed with type 1 diabetes as an adult, get an exam within 5 years of diagnosis and then yearly.  If you are diagnosed with type 2 diabetes, get an exam as soon as possible after the diagnosis and then yearly.  Foot care exam  Visual foot exams are performed at every routine medical visit. The exams check for cuts, injuries, or other problems with the feet.  A comprehensive foot exam should be done yearly. This includes visual inspection as well as assessing  foot pulses and testing for loss of sensation.  Check your feet nightly for cuts, injuries, or other problems with your feet. Tell your health care provider if anything is not healing.  Kidney function test (urine microalbumin)  This test is performed once a year.  Type 1 diabetes: The first test is performed 5 years after diagnosis.  Type 2 diabetes: The first test is performed at the time of diagnosis.  A serum creatinine and estimated glomerular filtration rate (eGFR) test is done once a year to assess the level of chronic kidney disease (CKD), if present.  Lipid profile (cholesterol, HDL, LDL, triglycerides)  Performed every 5 years for most people.  The goal for LDL is less than 100 mg/dL. If you are at high risk, the goal is less than 70 mg/dL.  The goal for HDL is 40 mg/dL to 50 mg/dL for men and 50 mg/dL to 60 mg/dL for women. An HDL cholesterol of 60 mg/dL or higher gives some protection against heart disease.  The goal for triglycerides is less than 150 mg/dL.  Influenza vaccine, pneumococcal vaccine, and hepatitis B vaccine  The influenza vaccine is recommended yearly.  The pneumococcal vaccine is generally given once in a lifetime. However, there are some instances when another vaccination is recommended. Check with your health care provider.  The hepatitis  B vaccine is also recommended for adults with diabetes.  Diabetes self-management education  Education is recommended at diagnosis and ongoing as needed.  Treatment plan  Your treatment plan is reviewed at every medical visit.  Document Released: 08/08/2009 Document Revised: 06/13/2013 Document Reviewed: 03/13/2013 [facility] Patient Information 2014 [place].

## 2014-08-27 LAB — URINE CULTURE: ORGANISM ID, BACTERIA: NO GROWTH

## 2014-08-30 ENCOUNTER — Telehealth: Payer: Self-pay | Admitting: Pharmacist

## 2014-09-02 NOTE — Telephone Encounter (Signed)
Tried to return call - left message on Cara's VM.

## 2014-09-03 NOTE — Telephone Encounter (Signed)
Received call from [name] at [facility].  After reviewing information Dr Ila Mcgill discontinued nitrofurantoin and started cephalexin.  Patient notified.

## 2014-10-03 ENCOUNTER — Ambulatory Visit (INDEPENDENT_AMBULATORY_CARE_PROVIDER_SITE_OTHER): Payer: Medicare Other | Admitting: Pharmacist

## 2014-10-03 ENCOUNTER — Encounter: Payer: Self-pay | Admitting: Pharmacist

## 2014-10-03 VITALS — BP 122/68 | HR 70 | Ht 64.0 in | Wt 182.0 lb

## 2014-10-03 DIAGNOSIS — I10 Essential (primary) hypertension: Secondary | ICD-10-CM | POA: Diagnosis not present

## 2014-10-03 DIAGNOSIS — E119 Type 2 diabetes mellitus without complications: Secondary | ICD-10-CM

## 2014-10-03 DIAGNOSIS — Z794 Long term (current) use of insulin: Principal | ICD-10-CM

## 2014-10-03 MED ORDER — TRAZODONE HCL 50 MG PO TABS: 50.0000 mg | ORAL_TABLET | Freq: Every evening | ORAL | Status: DC | PRN

## 2014-10-03 NOTE — Patient Instructions (Signed)
Increase lantus to 42 units once a day for 1 week.  As long as you have not had any low blood glucose readings then increase to 44 units once a day thereafter  Call office if you continue to have blood glucose readings over 200 or less than 70  Please call [phone]) or send a message through My Chart if you have further questions.

## 2014-10-03 NOTE — Progress Notes (Signed)
Diabetes Follow-Up Visit Chief Complaint:   Chief Complaint  Patient presents with  . Diabetes     Filed Vitals:   10/03/14 1036  BP: 122/68  Pulse: 70   Filed Weights   10/03/14 1036  Weight: 182 lb (82.555 kg)    HPI:  Diabetes: patient has long standing diabetes.  Her most recent A1C was 6.9%.  Over the last 2-3 months we have been adjusting her diabetes medications to try to get less fluctuation in her HBG readings.  Current medications for diabetes include -  Lantus 32 units daily and Novolog sliding scale (BG less than 80 - no insulin, 80 to 120 - 5 units, 121 to 150 - 6 units, 151 to 200 - 7 units, 201 to 250 - 8 units, 250 or above - 9 units and call office) Patient has tried metformin in past but stopped because she had low energy.  Once she stopped metformin her energy level improved but she is unsure if it really was metformin or not. She also was taking Jardiance but we stopped due to decrease in GFR to around 30.  Polyuria:  negative  Polydipsia:  negative Polyphagia:  negatvie  BMI:  Body mass index is 31.22 kg/(m^2).   Weight Decreased by 6# over last 3 weeks.   She has lost about 27# over the last 2 years. General Appearance:  alert, oriented, no acute distress and obese Mood/Affect:  normal  Diet - Continues to try to limit calorie or CHO intake.  Exercise:  Walking 1-2 times per week  Home BG Monitoring:  Checking 3 to 5 times a day. Average:  175 in am, 167 at lunch, 181 at dinner and 167 at bedtime  High: 294 Low:  86    Lab Results  Component Value Date   HGBA1C 6.9 07/24/2014      Lab Results  Component Value Date   CHOL 107 08/09/2013   HDL 66 04/15/2014   LDLCALC 46 04/15/2014   TRIG 47 04/15/2014   CHOLHDL 1.8 04/15/2014    In House Urine Microalbumin was negative (08/09/2013)  Estimated serum creatinine 36.43 (07/25/2014)  Assessment: 1.  Diabetes.  Fluctuations in BG improved / less and  A1c improved now below goal 2.  Blood Pressure.   controlled 3.  Lipids.  Controlled 4.  Obesity - patient has lost 27# since 03/23/2013 but slight increase over last 3 months   Recommendations: 1.  Medication recommendations at this time are as follows:    Increase Lantus to 44 units daily (42 units for 7 days then 44 units daily thereafter)  Continue Novolog sliding scale   BG less than 80 - no insulin    80 to 120 - 5 units    121 to 150 - 6 units    151 to 200 - 7 units    201 to 250 - 8 units    250 or above - 9 units and call office 2.  Continue trazodone to 50mg  1 QHS 3.  Reviewed HBG goals:  Fasting 80-130 and 1-2 hour post prandial <180.  Patient is instructed to check BG 3 to 5 times per day.    4.  BP goal < 140/80. 5.  LDL goal of < 100, HDL > 40 and TG < 150. 6.  Eye Exam yearly and Dental Exam every 6 months. 7.  RTC in 1 month to follow up with PCP  Cherre Robins, PharmD, CPP

## 2014-10-09 ENCOUNTER — Telehealth: Payer: Self-pay | Admitting: Pharmacist

## 2014-10-09 NOTE — Telephone Encounter (Signed)
Patient reports having some lows in the mornings / about 4 or 5 am - 10/03/14 was 85, 10/05/14 was 54 and 10/06/14 was 76 and 10/08/14 was 93 but she felt weak.   Patient is taking Lantus qhs.  I recommended that she change Lantus to am to see if that helped with hypoglycemia over night.

## 2014-10-22 DIAGNOSIS — Z01419 Encounter for gynecological examination (general) (routine) without abnormal findings: Secondary | ICD-10-CM | POA: Diagnosis not present

## 2014-10-22 DIAGNOSIS — Z124 Encounter for screening for malignant neoplasm of cervix: Secondary | ICD-10-CM | POA: Diagnosis not present

## 2014-10-22 DIAGNOSIS — Z0000 Encounter for general adult medical examination without abnormal findings: Secondary | ICD-10-CM | POA: Diagnosis not present

## 2014-10-24 DIAGNOSIS — H6690 Otitis media, unspecified, unspecified ear: Secondary | ICD-10-CM | POA: Diagnosis not present

## 2014-10-24 DIAGNOSIS — H60393 Other infective otitis externa, bilateral: Secondary | ICD-10-CM | POA: Diagnosis not present

## 2014-10-28 ENCOUNTER — Telehealth: Payer: Self-pay | Admitting: Family Medicine

## 2014-10-28 ENCOUNTER — Ambulatory Visit (INDEPENDENT_AMBULATORY_CARE_PROVIDER_SITE_OTHER): Payer: Medicare Other | Admitting: Nurse Practitioner

## 2014-10-28 ENCOUNTER — Encounter: Payer: Self-pay | Admitting: Nurse Practitioner

## 2014-10-28 VITALS — BP 121/58 | HR 81 | Temp 97.0°F | Ht 64.0 in | Wt 186.0 lb

## 2014-10-28 DIAGNOSIS — H60393 Other infective otitis externa, bilateral: Secondary | ICD-10-CM | POA: Diagnosis not present

## 2014-10-28 MED ORDER — CIPROFLOXACIN-HYDROCORTISONE 0.2-1 % OT SUSP: 3.0000 [drp] | Freq: Two times a day (BID) | OTIC | Status: DC

## 2014-10-28 MED ORDER — AZITHROMYCIN 250 MG PO TABS: ORAL_TABLET | ORAL | Status: DC

## 2014-10-28 NOTE — Patient Instructions (Signed)

## 2014-10-28 NOTE — Progress Notes (Signed)
   Subjective:    Patient ID: [name], female    DOB: [birth year], 74 y.o.   MRN: [id number]  HPI Patient was seen in urgent care last week and was dx with ear infection- Was given ear drops and oral antibiotic- could not take oral antibiotic because it caused diarrhea so she did not take but 1 dose- says that ears are still hurting.    Review of Systems  Constitutional: Negative for fever and chills.  HENT: Positive for ear pain.   Respiratory: Negative.   Cardiovascular: Negative.   Genitourinary: Negative.   Neurological: Negative.   Psychiatric/Behavioral: Negative.   All other systems reviewed and are negative.      Objective:   Physical Exam  Constitutional: She is oriented to person, place, and time. She appears well-developed and well-nourished. No distress.  HENT:  Right Ear: There is drainage (greenish yellow). Tympanic membrane is bulging. A middle ear effusion is present.  Left Ear: Left ear drainage: greenish yellow. Tympanic membrane is bulging. A middle ear effusion is present.  Nose: Mucosal edema and rhinorrhea present.  Mouth/Throat: Uvula is midline, oropharynx is clear and moist and mucous membranes are normal.  Cardiovascular: Normal rate, regular rhythm and normal heart sounds.   Pulmonary/Chest: Effort normal and breath sounds normal.  Neurological: She is alert and oriented to person, place, and time.  Skin: Skin is warm and dry.  Psychiatric: She has a normal mood and affect. Her behavior is normal. Judgment and thought content normal.   BP 121/58 mmHg  Pulse 81  Temp(Src) 97 F (36.1 C) (Oral)  Ht 5\' 4"  (1.626 m)  Wt 186 lb (84.369 kg)  BMI 31.91 kg/m2        Assessment & Plan:   1. Otitis, externa, infective, bilateral    Meds ordered this encounter  Medications  . azithromycin (ZITHROMAX Z-PAK) 250 MG tablet    Sig: As directed    Dispense:  6 each    Refill:  0    Order Specific Question:  Supervising Provider    Answer:   Chipper Herb [[id number]]  . ciprofloxacin-hydrocortisone (CIPRO HC) otic suspension    Sig: Place 3 drops into both ears 2 (two) times daily.    Dispense:  10 mL    Refill:  0    Order Specific Question:  Supervising Provider    Answer:  Chipper Herb [[id number]]   Force fluids Motrin or tylenol oTC RTO if not improving  Mary-Margaret Hassell Done, FNP

## 2014-10-28 NOTE — Telephone Encounter (Signed)
Pt given appt today with MMM at 4:30.

## 2014-10-31 ENCOUNTER — Telehealth: Payer: Self-pay | Admitting: Nurse Practitioner

## 2014-10-31 ENCOUNTER — Ambulatory Visit: Payer: Self-pay

## 2014-11-01 NOTE — Telephone Encounter (Signed)
Pt is having continued drainage and congestion Finished Z pac this AM and is still using ear drops Informed that she could try taking a decongestant to help relieve sxs Verbalizes understanding Will call for appt if sxs worsen or fail to resolve

## 2014-11-02 NOTE — Telephone Encounter (Signed)
Antibiotic will be in her system several more days- just needs to treat symptoms

## 2014-11-04 NOTE — Telephone Encounter (Signed)
Pt aware and voiced understanding 

## 2014-11-06 ENCOUNTER — Ambulatory Visit (INDEPENDENT_AMBULATORY_CARE_PROVIDER_SITE_OTHER): Payer: Medicare Other | Admitting: Nurse Practitioner

## 2014-11-06 ENCOUNTER — Encounter: Payer: Self-pay | Admitting: Nurse Practitioner

## 2014-11-06 ENCOUNTER — Ambulatory Visit: Payer: Self-pay

## 2014-11-06 VITALS — BP 137/73 | HR 75 | Temp 97.7°F | Ht 64.0 in | Wt 187.0 lb

## 2014-11-06 DIAGNOSIS — H60393 Other infective otitis externa, bilateral: Secondary | ICD-10-CM | POA: Diagnosis not present

## 2014-11-06 DIAGNOSIS — J0101 Acute recurrent maxillary sinusitis: Secondary | ICD-10-CM | POA: Diagnosis not present

## 2014-11-06 MED ORDER — DOXYCYCLINE HYCLATE 100 MG PO TABS: 100.0000 mg | ORAL_TABLET | Freq: Two times a day (BID) | ORAL | Status: DC

## 2014-11-06 MED ORDER — NEOMYCIN-POLYMYXIN-HC 1 % OT SOLN: 3.0000 [drp] | Freq: Four times a day (QID) | OTIC | Status: DC

## 2014-11-06 NOTE — Progress Notes (Signed)
   Subjective:    Patient ID: [name], female    DOB: Dec 11, 1940, 74 y.o.   MRN: [id number]  HPI Patient was seen January 4,2016 with bil ear pain and congestion- was given z pak in which she finished several days ago- She says taht she is no better- her ears are still stopped up and having trouble hearing- chills, but no fever. Feels like her entir head is congested.    Review of Systems  Constitutional: Positive for chills. Negative for fever and appetite change.  HENT: Positive for congestion, ear pain and sinus pressure.   Respiratory: Positive for cough.   Cardiovascular: Negative.   Gastrointestinal: Negative.   Genitourinary: Negative.   Neurological: Negative.   Psychiatric/Behavioral: Negative.   All other systems reviewed and are negative.      Objective:   Physical Exam  Constitutional: She is oriented to person, place, and time. She appears well-developed and well-nourished. No distress.  HENT:  Right Ear: No drainage. Tympanic membrane is not erythematous. A middle ear effusion is present.  Left Ear: There is drainage. Tympanic membrane is not erythematous. A middle ear effusion is present.  Nose: Mucosal edema and rhinorrhea present. Right sinus exhibits maxillary sinus tenderness. Right sinus exhibits no frontal sinus tenderness. Left sinus exhibits maxillary sinus tenderness. Left sinus exhibits no frontal sinus tenderness.  Mouth/Throat: Uvula is midline, oropharynx is clear and moist and mucous membranes are normal.  Cardiovascular: Normal rate and normal heart sounds.   Pulmonary/Chest: Effort normal and breath sounds normal.  Neurological: She is alert and oriented to person, place, and time.  Psychiatric: She has a normal mood and affect. Her behavior is normal. Judgment and thought content normal.   BP 137/73 mmHg  Pulse 75  Temp(Src) 97.7 F (36.5 C) (Oral)  Ht 5\' 4"  (1.626 m)  Wt 187 lb (84.823 kg)  BMI 32.08 kg/m2        Assessment & Plan:    1. Otitis, externa, infective, bilateral Avoid getting water in ears flonase nasal spray OTC daily - NEOMYCIN-POLYMYXIN-HYDROCORTISONE (CORTISPORIN) 1 % SOLN otic solution; Place 3 drops into both ears 4 (four) times daily.  Dispense: 10 mL; Refill: 0  2. Acute recurrent maxillary sinusitis 1. Take meds as prescribed 2. Use a cool mist humidifier especially during the winter months and when heat has been humid. 3. Use saline nose sprays frequently 4. Saline irrigations of the nose can be very helpful if done frequently.  * 4X daily for 1 week*  * Use of a nettie pot can be helpful with this. Follow directions with this* 5. Drink plenty of fluids 6. Keep thermostat turn down low 7.For any cough or congestion  Use plain Mucinex- regular strength or max strength is fine   * Children- consult with Pharmacist for dosing 8. For fever or aces or pains- take tylenol or ibuprofen appropriate for age and weight.  * for fevers greater than 101 orally you may alternate ibuprofen and tylenol every  3 hours.   Mary-Margaret Hassell Done, FNP  - doxycycline (VIBRA-TABS) 100 MG tablet; Take 1 tablet (100 mg total) by mouth 2 (two) times daily.  Dispense: 20 tablet; Refill: 0

## 2014-11-06 NOTE — Patient Instructions (Signed)

## 2014-11-07 ENCOUNTER — Other Ambulatory Visit: Payer: Self-pay | Admitting: Nurse Practitioner

## 2014-11-07 NOTE — Telephone Encounter (Signed)
Please check on cortisporin drops- computer says they received it if not just resend

## 2014-11-07 NOTE — Telephone Encounter (Signed)
Pt has already picked up RX

## 2014-11-13 ENCOUNTER — Ambulatory Visit: Payer: Medicare Other | Admitting: Family

## 2014-11-14 DIAGNOSIS — L84 Corns and callosities: Secondary | ICD-10-CM | POA: Diagnosis not present

## 2014-11-14 DIAGNOSIS — B351 Tinea unguium: Secondary | ICD-10-CM | POA: Diagnosis not present

## 2014-11-14 DIAGNOSIS — E1142 Type 2 diabetes mellitus with diabetic polyneuropathy: Secondary | ICD-10-CM | POA: Diagnosis not present

## 2014-11-27 ENCOUNTER — Ambulatory Visit (INDEPENDENT_AMBULATORY_CARE_PROVIDER_SITE_OTHER): Payer: Medicare Other | Admitting: Family Medicine

## 2014-11-27 ENCOUNTER — Encounter: Payer: Self-pay | Admitting: Family Medicine

## 2014-11-27 VITALS — BP 123/61 | HR 71 | Temp 98.1°F | Ht 64.0 in | Wt 186.0 lb

## 2014-11-27 DIAGNOSIS — E1169 Type 2 diabetes mellitus with other specified complication: Secondary | ICD-10-CM

## 2014-11-27 DIAGNOSIS — E0821 Diabetes mellitus due to underlying condition with diabetic nephropathy: Secondary | ICD-10-CM

## 2014-11-27 DIAGNOSIS — E785 Hyperlipidemia, unspecified: Secondary | ICD-10-CM

## 2014-11-27 DIAGNOSIS — I10 Essential (primary) hypertension: Secondary | ICD-10-CM

## 2014-11-27 DIAGNOSIS — Z23 Encounter for immunization: Secondary | ICD-10-CM

## 2014-11-27 LAB — POCT GLYCOSYLATED HEMOGLOBIN (HGB A1C): HEMOGLOBIN A1C: 7.3

## 2014-11-27 LAB — POCT UA - MICROALBUMIN: Microalbumin Ur, POC: 20 mg/L

## 2014-11-27 NOTE — Addendum Note (Signed)
Addended by: Selmer Dominion on: 11/27/2014 11:47 AM   Modules accepted: Orders

## 2014-11-27 NOTE — Progress Notes (Signed)
Subjective:    Patient ID: [name], female    DOB: 1941-09-17, 74 y.o.   MRN: [id number]  HPI  74 year old female comes in today for follow up on chronic medical conditions which include hypertension and diabetes. Regarding the diabetes she is on long-acting Lantus and short acting NovoLog uses NovoLog sliding scale depending on what her sugar is pre-meal. Her last A1c was at goal at 6.9 but she is due to have it repeated today. We reviewed her medicines today she is on a fair number but I do not see any that we can discontinue she takes Toprol and triamterene for blood pressure NovoLog and Lantus for diabetes several medicines for Hurthle bowel syndrome, Keflex for bladder as well as 3 different antidepressants, trazodone being more for sleep and depression. In that regard she uses both Cymbalta for depression as well as fibromyalgia  Patient Active Problem List   Diagnosis Date Noted  . Obesity (BMI 30-39.9) 07/04/2014  . Aneurysm of iliac artery 01/07/2014  . GERD (gastroesophageal reflux disease) 03/23/2013  . Type 2 diabetes mellitus with insulin therapy 03/23/2013  . Insomnia 03/23/2013  . Other malaise and fatigue 03/23/2013  . MITRAL VALVE PROLAPSE 07/17/2009  . Lichen planus 73/53/2992  . FIBROMYALGIA 07/17/2009  . VAGINITIS 04/04/2009  . URINARY INCONTINENCE 03/14/2009  . CHRONIC ULCER OF OTHER SPECIFIED SITE 12/24/2008  . Irritable bowel syndrome 08/29/2008  . BACK PAIN, LUMBAR 07/17/2008  . ACUTE CYSTITIS 01/24/2008  . UNSPECIFIED OSTEOPOROSIS 01/10/2008  . Dyslipidemia associated with type 2 diabetes mellitus 05/22/2007  . DEPRESSION 05/22/2007  . Essential hypertension 05/22/2007   Outpatient Encounter Prescriptions as of 11/27/2014  Medication Sig  . antiseptic oral rinse (BIOTENE) LIQD 15 mLs by Mouth Rinse route as needed for dry mouth.  Marland Kitchen aspirin 81 MG tablet Take 81 mg by mouth daily.    Marland Kitchen atorvastatin (LIPITOR) 20 MG tablet Take 0.5 tablets (10 mg total)  by mouth at bedtime.  . Calcium Carb-Cholecalciferol (CALCIUM 600+D3) 600-200 MG-UNIT TABS Take 1 tablet by mouth 2 (two) times daily.    . cephALEXin (KEFLEX) 250 MG capsule Take 250 mg by mouth daily.  Marland Kitchen desonide (DESOWEN) 0.05 % ointment Apply 1 application topically 2 (two) times daily.   . diphenoxylate-atropine (LOMOTIL) 2.5-0.025 MG per tablet Take 1 tablet by mouth 4 (four) times daily as needed for diarrhea or loose stools.  . DULoxetine (CYMBALTA) 60 MG capsule Take 1 capsule (60 mg total) by mouth daily.  Marland Kitchen escitalopram (LEXAPRO) 20 MG tablet Take 0.5 tablets (10 mg total) by mouth daily.  Marland Kitchen esomeprazole (NEXIUM) 40 MG capsule Take 1 capsule (40 mg total) by mouth daily.  Marland Kitchen estradiol (ESTRACE) 0.1 MG/GM vaginal cream Place 2 g vaginally every Monday, Wednesday, and Friday.   . fish oil-omega-3 fatty acids 1000 MG capsule Take 1 capsule by mouth 2 (two) times daily.   . fluticasone (FLONASE) 50 MCG/ACT nasal spray Place 1 spray into both nostrils daily.  Marland Kitchen glucose blood (FREESTYLE LITE) test strip Use as instructed to check BG up to qid. Dx: type 2 DM uncontrolled with insulin use  . insulin aspart (NOVOLOG FLEXPEN) 100 UNIT/ML FlexPen Inject prior to each meal per sliding scale.BG less than 80 - no insulin; 80 to 120 - 4 units; 121 to 150 - 6 units; 151 to 200 - 7 units; 201 to 250 - 8 units; 250 or above - 9 units and call office.  . Insulin Glargine (LANTUS SOLOSTAR) 100  UNIT/ML Solostar Pen Inject 44 Units into the skin every morning.  . Insulin Pen Needle (NOVOFINE) 30G X 8 MM MISC Use to inject insulin up to qid.  . metoprolol succinate (TOPROL-XL) 25 MG 24 hr tablet Take 0.5 tablets (12.5 mg total) by mouth daily.  . mirabegron ER (MYRBETRIQ) 50 MG TB24 tablet Take 50 mg by mouth daily.  . Multiple Vitamin (MULTIVITAMIN) tablet Take 0.5 tablets by mouth 2 (two) times daily.   . naproxen sodium (ANAPROX) 220 MG tablet Take 220 mg by mouth 2 (two) times daily with a meal.  .  traZODone (DESYREL) 50 MG tablet Take 1 tablet (50 mg total) by mouth at bedtime as needed for sleep.  Marland Kitchen triamterene-hydrochlorothiazide (MAXZIDE-25) 37.5-25 MG per tablet Take 0.5 tablets by mouth daily.  . clidinium-chlordiazePOXIDE (LIBRAX) 5-2.5 MG per capsule Take 1 capsule by mouth 2 (two) times daily before a meal.  . [DISCONTINUED] doxycycline (VIBRA-TABS) 100 MG tablet Take 1 tablet (100 mg total) by mouth 2 (two) times daily.  . [DISCONTINUED] Insulin Syringe-Needle U-100 (INSULIN SYRINGE 1CC/31GX5/16") 31G X 5/16" 1 ML MISC 1 each by Does not apply route as directed.  . [DISCONTINUED] NEOMYCIN-POLYMYXIN-HYDROCORTISONE (CORTISPORIN) 1 % SOLN otic solution Place 3 drops into both ears 4 (four) times daily.  . [DISCONTINUED] polyethylene glycol (MIRALAX / GLYCOLAX) packet Take 17 g by mouth daily as needed.     Review of Systems  Constitutional: Negative.   HENT: Negative.   Eyes: Negative.   Respiratory: Negative.   Cardiovascular: Negative.   Gastrointestinal: Negative.   Endocrine: Negative.   Genitourinary: Negative.   Neurological: Negative.   Psychiatric/Behavioral: Negative.        Objective:   Physical Exam  Constitutional: She is oriented to person, place, and time. She appears well-developed and well-nourished.  Neck: Normal range of motion.  Cardiovascular: Normal rate, regular rhythm, normal heart sounds and intact distal pulses.   Pulmonary/Chest: Effort normal and breath sounds normal.  Abdominal: Soft. Bowel sounds are normal.  Musculoskeletal: Normal range of motion.  Status post left total knee replacement  Neurological: She is alert and oriented to person, place, and time. She has normal reflexes.  Skin: Skin is warm.  Psychiatric: She has a normal mood and affect. Her behavior is normal. Judgment and thought content normal.    BP 123/61 mmHg  Pulse 71  Temp(Src) 98.1 F (36.7 C) (Oral)  Ht 5' 4"  (1.626 m)  Wt 186 lb (84.369 kg)  BMI 31.91  kg/m2        Assessment & Plan:  1. Essential hypertension Blood pressure well controlled on beta blocker and diuretic. I suspect she is not on ace her arm because of her declining renal function and creatinine clearance - CMP14+EGFR  2. Dyslipidemia associated with type 2 diabetes mellitus Lipids were checked about 8 months ago we will repeat those today - POCT glycosylated hemoglobin (Hb A1C) - POCT UA - Microalbumin - Lipid panel  3. Diabetes mellitus due to underlying condition with diabetic nephropathy Her sugars are generally okay per her record on long and short acting insulin. Will monitor A1c's every 4-6 months - POCT glycosylated hemoglobin (Hb A1C)  Wardell Honour MD

## 2014-11-27 NOTE — Addendum Note (Signed)
Addended by: Selmer Dominion on: 11/27/2014 11:12 AM   Modules accepted: Orders

## 2014-11-27 NOTE — Patient Instructions (Signed)
Pneumococcal Vaccine, Polyvalent suspension for injection What is this medicine? PNEUMOCOCCAL VACCINE, POLYVALENT (NEU mo KOK al vak SEEN, pol ee VEY luhnt) is a vaccine to prevent pneumococcus bacteria infection. These bacteria are a major cause of ear infections, 'Strep throat' infections, and serious pneumonia, meningitis, or blood infections worldwide. These vaccines help the body to produce antibodies (protective substances) that help your body defend against these bacteria. This vaccine is recommended for infants and young children. This vaccine will not treat an infection. This medicine may be used for other purposes; ask your health care provider or pharmacist if you have questions. COMMON BRAND NAME(S): Prevnar 13 What should I tell my health care provider before I take this medicine? They need to know if you have any of these conditions: -bleeding problems -fever -immune system problems -low platelet count in the blood -seizures -an unusual or allergic reaction to pneumococcal vaccine, diphtheria toxoid, other vaccines, latex, other medicines, foods, dyes, or preservatives -pregnant or trying to get pregnant -breast-feeding How should I use this medicine? This vaccine is for injection into a muscle. It is given by a health care professional. A copy of Vaccine Information Statements will be given before each vaccination. Read this sheet carefully each time. The sheet may change frequently. Talk to your pediatrician regarding the use of this medicine in children. While this drug may be prescribed for children as young as 38 weeks old for selected conditions, precautions do apply. Overdosage: If you think you have taken too much of this medicine contact a poison control center or emergency room at once. NOTE: This medicine is only for you. Do not share this medicine with others. What if I miss a dose? It is important not to miss your dose. Call your doctor or health care professional if  you are unable to keep an appointment. What may interact with this medicine? -medicines for cancer chemotherapy -medicines that suppress your immune function -medicines that treat or prevent blood clots like warfarin, enoxaparin, and dalteparin -steroid medicines like prednisone or cortisone This list may not describe all possible interactions. Give your health care provider a list of all the medicines, herbs, non-prescription drugs, or dietary supplements you use. Also tell them if you smoke, drink alcohol, or use illegal drugs. Some items may interact with your medicine. What should I watch for while using this medicine? Mild fever and pain should go away in 3 days or less. Report any unusual symptoms to your doctor or health care professional. What side effects may I notice from receiving this medicine? Side effects that you should report to your doctor or health care professional as soon as possible: -allergic reactions like skin rash, itching or hives, swelling of the face, lips, or tongue -breathing problems -confused -fever over 102 degrees F -pain, tingling, numbness in the hands or feet -seizures -unusual bleeding or bruising -unusual muscle weakness Side effects that usually do not require medical attention (report to your doctor or health care professional if they continue or are bothersome): -aches and pains -diarrhea -fever of 102 degrees F or less -headache -irritable -loss of appetite -pain, tender at site where injected -trouble sleeping This list may not describe all possible side effects. Call your doctor for medical advice about side effects. You may report side effects to FDA at 1-800-FDA-1088. Where should I keep my medicine? This does not apply. This vaccine is given in a clinic, pharmacy, doctor's office, or other health care setting and will not be stored at home. NOTE:  This sheet is a summary. It may not cover all possible information. If you have questions  about this medicine, talk to your doctor, pharmacist, or health care provider.  2015, Elsevier/Gold Standard. (2008-12-24 10:17:22)   Td Vaccine (Tetanus and Diphtheria): What You Need to Know 1. Why get vaccinated? Tetanus  and diphtheria are very serious diseases. They are rare in the [place] today, but people who do become infected often have severe complications. Td vaccine is used to protect adolescents and adults from both of these diseases. Both tetanus and diphtheria are infections caused by bacteria. Diphtheria spreads from person to person through coughing or sneezing. Tetanus-causing bacteria enter the body through cuts, scratches, or wounds. TETANUS (Lockjaw) causes painful muscle tightening and stiffness, usually all over the body.  It can lead to tightening of muscles in the head and neck so you can't open your mouth, swallow, or sometimes even breathe. Tetanus kills about 1 out of every 5 people who are infected. DIPHTHERIA can cause a thick coating to form in the back of the throat.  It can lead to breathing problems, paralysis, heart failure, and death. Before vaccines, the [place] saw as many as 200,000 cases a year of diphtheria and hundreds of cases of tetanus. Since vaccination began, cases of both diseases have dropped by about 99%. 2. Td vaccine Td vaccine can protect adolescents and adults from tetanus and diphtheria. Td is usually given as a booster dose every 10 years but it can also be given earlier after a severe and dirty wound or burn. Your doctor can give you more information. Td may safely be given at the same time as other vaccines. 3. Some people should not get this vaccine  If you ever had a life-threatening allergic reaction after a dose of any tetanus or diphtheria containing vaccine, OR if you have a severe allergy to any part of this vaccine, you should not get Td. Tell your doctor if you have any severe allergies.  Talk to your doctor if  you:  have epilepsy or another nervous system problem,  had severe pain or swelling after any vaccine containing diphtheria or tetanus,  ever had Guillain Barr Syndrome (GBS),  aren't feeling well on the day the shot is scheduled. 4. Risks of a vaccine reaction With a vaccine, like any medicine, there is a chance of side effects. These are usually mild and go away on their own. Serious side effects are also possible, but are very rare. Most people who get Td vaccine do not have any problems with it. Mild Problems  following Td (Did not interfere with activities)  Pain where the shot was given (about 8 people in 10)  Redness or swelling where the shot was given (about 1 person in 3)  Mild fever (about 1 person in 15)  Headache or Tiredness (uncommon) Moderate Problems following Td (Interfered with activities, but did not require medical attention)  Fever over 102F (rare) Severe Problems  following Td (Unable to perform usual activities; required medical attention)  Swelling, severe pain, bleeding and/or redness in the arm where the shot was given (rare). Problems that could happen after any vaccine:  Brief fainting spells can happen after any medical procedure, including vaccination. Sitting or lying down for about 15 minutes can help prevent fainting, and injuries caused by a fall. Tell your doctor if you feel dizzy, or have vision changes or ringing in the ears.  Severe shoulder pain and reduced range of motion in the  arm where a shot was given can happen, very rarely, after a vaccination.  Severe allergic reactions from a vaccine are very rare, estimated at less than 1 in a million doses. If one were to occur, it would usually be within a few minutes to a few hours after the vaccination. 5. What if there is a serious reaction? What should I look for?  Look for anything that concerns you, such as signs of a severe allergic reaction, very high fever, or behavior  changes. Signs of a severe allergic reaction can include hives, swelling of the face and throat, difficulty breathing, a fast heartbeat, dizziness, and weakness. These would usually start a few minutes to a few hours after the vaccination. What should I do?  If you think it is a severe allergic reaction or other emergency that can't wait, call 9-1-1 or get the person to the nearest hospital. Otherwise, call your doctor.  Afterward, the reaction should be reported to the Vaccine Adverse Event Reporting System (VAERS). Your doctor might file this report, or you can do it yourself through the VAERS web site at www.vaers.SamedayNews.es, or by calling 564-824-7159. VAERS is only for reporting reactions. They do not give medical advice. 6. The National Vaccine Injury Compensation Program The [employer] Vaccine Injury Compensation Program (VICP) is a federal program that was created to compensate people who may have been injured by certain vaccines. Persons who believe they may have been injured by a vaccine can learn about the program and about filing a claim by calling 534-227-8201 or visiting the [place] website at GoldCloset.com.ee. 7. How can I learn more?  Ask your doctor.  Contact your local or state health department.  Contact the Centers for Disease Control and Prevention (CDC):  Call (267)361-1603 (1-800-CDC-INFO)  Visit CDC's website at http://hunter.com/ CDC Td Vaccine Interim VIS (11/28/12) Document Released: 08/08/2006 Document Revised: 02/25/2014 Document Reviewed: 01/23/2014 [facility] Patient Information 2015 [place], [place]. This information is not intended to replace advice given to you by your health care provider. Make sure you discuss any questions you have with your health care provider.

## 2014-11-28 LAB — CMP14+EGFR
ALBUMIN: 3.9 g/dL (ref 3.5–4.8)
ALK PHOS: 76 IU/L (ref 39–117)
ALT: 28 IU/L (ref 0–32)
AST: 30 IU/L (ref 0–40)
Albumin/Globulin Ratio: 1.8 (ref 1.1–2.5)
BUN/Creatinine Ratio: 21 (ref 11–26)
BUN: 23 mg/dL (ref 8–27)
CALCIUM: 9 mg/dL (ref 8.7–10.3)
CO2: 28 mmol/L (ref 18–29)
Chloride: 94 mmol/L — ABNORMAL LOW (ref 97–108)
Creatinine, Ser: 1.11 mg/dL — ABNORMAL HIGH (ref 0.57–1.00)
GFR calc non Af Amer: 49 mL/min/{1.73_m2} — ABNORMAL LOW (ref >59)
GFR, EST AFRICAN AMERICAN: 57 mL/min/{1.73_m2} — AB (ref >59)
Globulin, Total: 2.2 g/dL (ref 1.5–4.5)
Glucose: 109 mg/dL — ABNORMAL HIGH (ref 65–99)
POTASSIUM: 4.4 mmol/L (ref 3.5–5.2)
Sodium: 137 mmol/L (ref 134–144)
Total Bilirubin: 0.4 mg/dL (ref 0.0–1.2)
Total Protein: 6.1 g/dL (ref 6.0–8.5)

## 2014-11-28 LAB — MICROALBUMIN, URINE: Microalbumin, Urine: <3 ug/mL (ref 0.0–17.0)

## 2014-11-28 LAB — LIPID PANEL
Chol/HDL Ratio: 2 ratio units (ref 0.0–4.4)
Cholesterol, Total: 132 mg/dL (ref 100–199)
HDL: 66 mg/dL (ref >39)
LDL CALC: 43 mg/dL (ref 0–99)
TRIGLYCERIDES: 115 mg/dL (ref 0–149)
VLDL Cholesterol Cal: 23 mg/dL (ref 5–40)

## 2014-11-29 ENCOUNTER — Encounter: Payer: Self-pay | Admitting: Family Medicine

## 2014-12-05 ENCOUNTER — Other Ambulatory Visit: Payer: Self-pay | Admitting: Family Medicine

## 2014-12-05 MED ORDER — FLUCONAZOLE 150 MG PO TABS: 150.0000 mg | ORAL_TABLET | Freq: Once | ORAL | Status: DC

## 2014-12-05 NOTE — Telephone Encounter (Signed)
Diflucan sent to pharmacy.

## 2014-12-05 NOTE — Telephone Encounter (Signed)
Patient aware rx sent to pharmacy.  

## 2014-12-20 DIAGNOSIS — L304 Erythema intertrigo: Secondary | ICD-10-CM | POA: Diagnosis not present

## 2014-12-20 DIAGNOSIS — L239 Allergic contact dermatitis, unspecified cause: Secondary | ICD-10-CM | POA: Diagnosis not present

## 2014-12-26 ENCOUNTER — Other Ambulatory Visit: Payer: Self-pay | Admitting: Family Medicine

## 2014-12-30 ENCOUNTER — Ambulatory Visit (INDEPENDENT_AMBULATORY_CARE_PROVIDER_SITE_OTHER): Payer: Medicare Other | Admitting: Pharmacist

## 2014-12-30 ENCOUNTER — Encounter: Payer: Self-pay | Admitting: Pharmacist

## 2014-12-30 VITALS — BP 148/78 | HR 76 | Ht 63.25 in | Wt 188.0 lb

## 2014-12-30 DIAGNOSIS — Z0000 Encounter for general adult medical examination without abnormal findings: Secondary | ICD-10-CM | POA: Diagnosis not present

## 2014-12-30 MED ORDER — GLUCOSE BLOOD VI STRP: ORAL_STRIP | Status: DC

## 2014-12-30 MED ORDER — ESOMEPRAZOLE MAGNESIUM 20 MG PO CPDR: 20.0000 mg | DELAYED_RELEASE_CAPSULE | Freq: Every day | ORAL | Status: DC

## 2014-12-30 MED ORDER — TRIAMTERENE-HCTZ 37.5-25 MG PO TABS: 0.5000 | ORAL_TABLET | Freq: Every day | ORAL | Status: DC

## 2014-12-30 NOTE — Progress Notes (Signed)
Subjective:   [name] is a 74 y.o. female who presents for a Subsequent  Medicare Annual Wellness Visit and recheck diabetes  Last A1c = 7.3% (11/27/2014) Urine Microalbumin was negative    Current Medications (verified) Outpatient Encounter Prescriptions as of 12/30/2014  Medication Sig  . antiseptic oral rinse (BIOTENE) LIQD 15 mLs by Mouth Rinse route as needed for dry mouth.  Marland Kitchen aspirin 81 MG tablet Take 81 mg by mouth daily.    Marland Kitchen atorvastatin (LIPITOR) 20 MG tablet Take 0.5 tablets (10 mg total) by mouth at bedtime.  . Calcium Carb-Cholecalciferol (CALCIUM 600+D3) 600-200 MG-UNIT TABS Take 1 tablet by mouth 2 (two) times daily.    . cephALEXin (KEFLEX) 250 MG capsule Take 250 mg by mouth daily.  . clidinium-chlordiazePOXIDE (LIBRAX) 5-2.5 MG per capsule Take 1 capsule by mouth 2 (two) times daily as needed.   . desonide (DESOWEN) 0.05 % ointment Apply 1 application topically 2 (two) times daily.   . diphenoxylate-atropine (LOMOTIL) 2.5-0.025 MG per tablet Take 1 tablet by mouth 4 (four) times daily as needed for diarrhea or loose stools.  . DULoxetine (CYMBALTA) 60 MG capsule Take 1 capsule (60 mg total) by mouth daily.  Marland Kitchen escitalopram (LEXAPRO) 20 MG tablet Take 0.5 tablets (10 mg total) by mouth daily.  Marland Kitchen estradiol (ESTRACE) 0.1 MG/GM vaginal cream Place 2 g vaginally every Monday, Wednesday, and Friday.   . fluticasone (FLONASE) 50 MCG/ACT nasal spray Place 1 spray into both nostrils daily.  Marland Kitchen glucose blood (FREESTYLE LITE) test strip Use qid to check blood sugar. Dx E11.9  . insulin aspart (NOVOLOG FLEXPEN) 100 UNIT/ML FlexPen Inject prior to each meal per sliding scale.BG less than 80 - no insulin; 80 to 120 - 4 units; 121 to 150 - 6 units; 151 to 200 - 7 units; 201 to 250 - 8 units; 250 or above - 9 units and call office.  . Insulin Glargine (LANTUS SOLOSTAR) 100 UNIT/ML Solostar Pen Inject 44 Units into the skin every morning.  . Insulin Pen Needle (NOVOFINE) 30G X 8 MM  MISC Use to inject insulin up to qid.  . metoprolol succinate (TOPROL-XL) 25 MG 24 hr tablet Take 0.5 tablets (12.5 mg total) by mouth daily.  . mirabegron ER (MYRBETRIQ) 50 MG TB24 tablet Take 50 mg by mouth daily.  . Multiple Vitamin (MULTIVITAMIN) tablet Take 0.5 tablets by mouth 2 (two) times daily.   . naproxen sodium (ANAPROX) 220 MG tablet Take 220 mg by mouth 2 (two) times daily with a meal.  . traZODone (DESYREL) 50 MG tablet Take 1 tablet (50 mg total) by mouth at bedtime as needed for sleep.  Marland Kitchen triamterene-hydrochlorothiazide (MAXZIDE-25) 37.5-25 MG per tablet Take 0.5 tablets by mouth daily.  . [DISCONTINUED] esomeprazole (NEXIUM) 40 MG capsule Take 1 capsule (40 mg total) by mouth daily.  . [DISCONTINUED] fish oil-omega-3 fatty acids 1000 MG capsule Take 1 capsule by mouth 2 (two) times daily.   . [DISCONTINUED] triamterene-hydrochlorothiazide (MAXZIDE-25) 37.5-25 MG per tablet Take 0.5 tablets by mouth daily.  Marland Kitchen esomeprazole (NEXIUM) 20 MG capsule Take 1 capsule (20 mg total) by mouth daily at 12 noon.  . [DISCONTINUED] fluconazole (DIFLUCAN) 150 MG tablet Take 1 tablet (150 mg total) by mouth once. (Patient not taking: Reported on 12/30/2014)  . [DISCONTINUED] glucose blood (FREESTYLE LITE) test strip Use as instructed to check BG up to qid. Dx: type 2 DM uncontrolled with insulin use  . [DISCONTINUED] triamterene-hydrochlorothiazide (MAXZIDE-25) 37.5-25 MG per tablet  Take 0.5 tablets by mouth daily.    Allergies (verified) Latex; Amoxicillin; Celebrex; Fluticasone; Hydrocodone; Hydrocodone-acetaminophen; Hyoscyamine sulfate; Morphine; Tramadol; Vicoprofen; Zolpidem tartrate; Prednisone; and Sulfa antibiotics   History: Past Medical History  Diagnosis Date  . Fibromyalgia   . IBS (irritable bowel syndrome)   . Diabetic retinopathy   . Osteoporosis   . Acute cystitis   . Depression   . Diabetes mellitus type II   . Hyperlipidemia   . Hypertension   . GERD (gastroesophageal  reflux disease)   . Insomnia   . Urge incontinence of urine   . IBS (irritable bowel syndrome)   . Postmenopausal   . Lichen planus     vulvar  . DM type 2 causing CKD stage 3    Past Surgical History  Procedure Laterality Date  . Cataract extraction w/ intraocular lens implant  09/2003, 12/2003  . Cholecystectomy  2004  . Tubal ligation  1970  . Ankle surgery  02/2004  . Total knee arthroplasty      Left  . Eye surgery      laser treatments  . Trigger finger release Left 01/30/2002  . Joint replacement     Family History  Problem Relation Age of Onset  . Diabetes Mother   . Hypertension Mother   . Kidney disease Mother   . Heart disease Mother   . Peripheral vascular disease Mother     amputation  . Cancer Father     prostate  . Diabetes Father   . Heart disease Father     CABG  . Hypertension Father   . Heart attack Father   . Depression Sister   . Hypertension Sister    Social History   Occupational History  . retired    Social History Main Topics  . Smoking status: Never Smoker   . Smokeless tobacco: Never Used  . Alcohol Use: No  . Drug Use: No  . Sexual Activity: No   Current Exercise Habits:: The patient does not participate in regular exercise at present Diet - tries to limit high CHO foods due to diabetes.  Patient is actively limiting portion sizes of all foods to decrease calorie intake Do you feel safe at home?  Yes    Objective:    Today's Vitals   12/30/14 1215  BP: 148/78  Pulse: 76  Height: 5' 3.25" (1.607 m)  Weight: 188 lb (85.276 kg)   Performed Diabetic Foot Exam (but was unable to open usual place to document food exam) No foot ulcers noted No calluses noted Skin intact No signs or evidence of rubbing or improper shoe fit Positive posterior tibial pulses both feet Positive doral pedis pulses both feet  monifilament test - 9/10 areas of normal sensation on right foot - decreased sensation at right great toe 9/10 areas of  normal sensation on left foot - decrased sensation at left great toe.    Activities of Daily Living In your present state of health, do you have any difficulty performing the following activities: 12/30/2014  Is the patient deaf or have difficulty hearing? Y  Hearing N  Vision N  Difficulty concentrating or making decisions Y  Walking or climbing stairs? N  Doing errands, shopping? N  Preparing Food and eating ? N  Using the Toilet? N  In the past six months, have you accidently leaked urine? Y  Do you have problems with loss of bowel control? Y  Managing your Medications? N  Managing your Finances? N  Housekeeping or managing your Housekeeping? N    Are there smokers in your home (other than you)? No   Depression Screen PHQ 2/9 Scores 12/30/2014 03/21/2014 11/01/2013  PHQ - 2 Score 2 1 0  PHQ- 9 Score 2 - -    Fall Risk Fall Risk  12/30/2014 03/21/2014 11/01/2013 11/01/2013  Falls in the past year? No No No No  Risk for fall due to : - - - Other (Comment)  Risk for fall due to (comments): - - - bulging disc with increased pain recently    Cognitive Function: MMSE - Mini Mental State Exam 12/30/2014  Orientation to time 5  Orientation to Place 5  Registration 3  Attention/ Calculation 5  Recall 3  Language- name 2 objects 2  Language- repeat 1  Language- follow 3 step command 3  Language- read & follow direction 1  Write a sentence 1  Copy design 1  Total score 30    Immunizations and Health Maintenance Immunization History  Administered Date(s) Administered  . Influenza,inj,Quad PF,36+ Mos 07/30/2013, 07/24/2014  . Pneumococcal Conjugate-13 11/27/2014  . Pneumococcal Polysaccharide-23 10/22/2009  . Td 10/25/2004  . Tdap 11/27/2014  . Zoster 04/06/2012   There are no preventive care reminders to display for this patient.  Patient Care Team: Wardell Honour, MD as PCP - General (Family Medicine) Kendell Bane, MD (Surgery) Magnus Sinning, MD as Consulting Physician  (Orthopedic Surgery) Teena Irani, MD as Consulting Physician (Gastroenterology) Paula Compton, MD as Consulting Physician (Obstetrics and Gynecology) Rolm Bookbinder, MD as Consulting Physician (Dermatology) Bjorn Loser, MD as Consulting Physician (Urology) Minus Breeding, MD as Consulting Physician (Cardiology) Steffanie Rainwater, DPM as Consulting Physician (Podiatry) Raeanne Gathers, AUD as Consulting Physician (Audiology) Susa Day, MD as Consulting Physician (Orthopedic Surgery)  Indicate any recent Medical Services you may have received from other than [facility] providers in the past year (date may be approximate).    Assessment:    Annual Wellness Visit  Type 2 DM - controlled   Screening Tests Health Maintenance  Topic Date Due  . OPHTHALMOLOGY EXAM  03/12/2015  . INFLUENZA VACCINE  05/26/2015  . HEMOGLOBIN A1C  05/28/2015  . URINE MICROALBUMIN  11/28/2015  . FOOT EXAM  12/30/2015  . MAMMOGRAM  03/19/2016  . DEXA SCAN  03/19/2016  . COLONOSCOPY  12/24/2019  . TETANUS/TDAP  11/27/2024  . ZOSTAVAX  Completed  . PNA vac Low Risk Adult  Completed        Plan:   During the course of the visit [place] was educated and counseled about the following appropriate screening and preventive services:   Vaccines to include Pneumoccal, Influenza, Hepatitis B, Td, Zostavax - all vaccines UTD  Colorectal cancer screening - due to have FOBT checked - test given in office today  Cardiovascular disease screening - UTD  Bone Denisty / Osteoporosis Screening - UTD  Mammogram - UTD  PAP - UTD  Glaucoma screening- UTD, need to request last visit  Decrease nexium to 20mg  daily for 1 month , then try 20mg  every other day, then discontinue  Discontinue Fish oil   Patient Instructions (the written plan) were given to the patient.   Cherre Robins, [facility]   12/30/2014

## 2014-12-30 NOTE — Patient Instructions (Signed)
Health Maintenance Summary     OPHTHALMOLOGY EXAM Next Due 03/12/2015 Last 03/11/2014    INFLUENZA VACCINE Next Due 05/26/2015 Last fall 2015    HEMOGLOBIN A1C Next Due 03/28/2015     URINE MICROALBUMIN Next Due 11/28/2015 Last normal 11/27/2014    FOOT EXAM Next Due 12/30/2015     MAMMOGRAM Next Due 03/19/2016 Last 03/19/2014    DEXA SCAN Next Due 03/19/2016 Last 03/19/2014    COLONOSCOPY Next Due 12/24/2019 Last 12/23/2009   Pneumonia vaccine completed 11/27/2014    Zostervax / shingles completed 2013     TETANUS/TDAP Next Due 11/27/2024       Stop fish oil  Decrease nexium to 30m 1 capsule daily for next month - then try to take every other day for 1 month, then try to discontinue Increase physical activity - walking - goal is 30 minutes 5 days per week    Preventive Care for Adults A healthy lifestyle and preventive care can promote health and wellness. Preventive health guidelines for women include the following key practices.  A routine yearly physical is a good way to check with your health care provider about your health and preventive screening. It is a chance to share any concerns and updates on your health and to receive a thorough exam.  Visit your dentist for a routine exam and preventive care every 6 months. Brush your teeth twice a day and floss once a day. Good oral hygiene prevents tooth decay and gum disease.  The frequency of eye exams is based on your age, health, family medical history, use of contact lenses, and other factors. Follow your health care provider's recommendations for frequency of eye exams.  Eat a healthy diet. Foods like vegetables, fruits, whole grains, low-fat dairy products, and lean protein foods contain the nutrients you need without too many calories. Decrease your intake of foods high in solid fats, added sugars, and salt. Eat the right amount of calories for you.Get information about a proper diet from your health care provider, if necessary.  Regular  physical exercise is one of the most important things you can do for your health. Most adults should get at least 150 minutes of moderate-intensity exercise (any activity that increases your heart rate and causes you to sweat) each week. In addition, most adults need muscle-strengthening exercises on 2 or more days a week.  Maintain a healthy weight. The body mass index (BMI) is a screening tool to identify possible weight problems. It provides an estimate of body fat based on height and weight. Your health care provider can find your BMI and can help you achieve or maintain a healthy weight.For adults 20 years and older:  A BMI below 18.5 is considered underweight.  A BMI of 18.5 to 24.9 is normal.  A BMI of 25 to 29.9 is considered overweight.  A BMI of 30 and above is considered obese.  Maintain normal blood lipids and cholesterol levels by exercising and minimizing your intake of saturated fat. Eat a balanced diet with plenty of fruit and vegetables. Blood tests for lipids and cholesterol should begin at age 628and be repeated every 5 years. If your lipid or cholesterol levels are high, you are over 50, or you are at high risk for heart disease, you may need your cholesterol levels checked more frequently.Ongoing high lipid and cholesterol levels should be treated with medicines if diet and exercise are not working.  If you smoke, find out from your health care provider how  to quit. If you do not use tobacco, do not start.  Lung cancer screening is recommended for adults aged 57-80 years who are at high risk for developing lung cancer because of a history of smoking. A yearly low-dose CT scan of the lungs is recommended for people who have at least a 30-pack-year history of smoking and are a current smoker or have quit within the past 15 years. A pack year of smoking is smoking an average of 1 pack of cigarettes a day for 1 year (for example: 1 pack a day for 30 years or 2 packs a day for 15  years). Yearly screening should continue until the smoker has stopped smoking for at least 15 years. Yearly screening should be stopped for people who develop a health problem that would prevent them from having lung cancer treatment.  If you are pregnant, do not drink alcohol. If you are breastfeeding, be very cautious about drinking alcohol. If you are not pregnant and choose to drink alcohol, do not have more than 1 drink per day. One drink is considered to be 12 ounces (355 mL) of beer, 5 ounces (148 mL) of wine, or 1.5 ounces (44 mL) of liquor.  Avoid use of street drugs. Do not share needles with anyone. Ask for help if you need support or instructions about stopping the use of drugs.  High blood pressure causes heart disease and increases the risk of stroke. Your blood pressure should be checked at least every 1 to 2 years. Ongoing high blood pressure should be treated with medicines if weight loss and exercise do not work.  If you are 48-6 years old, ask your health care provider if you should take aspirin to prevent strokes.  Diabetes screening involves taking a blood sample to check your fasting blood sugar level. This should be done once every 3 years, after age 75, if you are within normal weight and without risk factors for diabetes. Testing should be considered at a younger age or be carried out more frequently if you are overweight and have at least 1 risk factor for diabetes.  Breast cancer screening is essential preventive care for women. You should practice "breast self-awareness." This means understanding the normal appearance and feel of your breasts and may include breast self-examination. Any changes detected, no matter how small, should be reported to a health care provider. Women in their 18s and 30s should have a clinical breast exam (CBE) by a health care provider as part of a regular health exam every 1 to 3 years. After age 26, women should have a CBE every year. Starting at  age 15, women should consider having a mammogram (breast X-ray test) every year. Women who have a family history of breast cancer should talk to their health care provider about genetic screening. Women at a high risk of breast cancer should talk to their health care providers about having an MRI and a mammogram every year.  Breast cancer gene (BRCA)-related cancer risk assessment is recommended for women who have family members with BRCA-related cancers. BRCA-related cancers include breast, ovarian, tubal, and peritoneal cancers. Having family members with these cancers may be associated with an increased risk for harmful changes (mutations) in the breast cancer genes BRCA1 and BRCA2. Results of the assessment will determine the need for genetic counseling and BRCA1 and BRCA2 testing.  Routine pelvic exams to screen for cancer are no longer recommended for nonpregnant women who are considered low risk for cancer of the  pelvic organs (ovaries, uterus, and vagina) and who do not have symptoms. Ask your health care provider if a screening pelvic exam is right for you.  If you have had past treatment for cervical cancer or a condition that could lead to cancer, you need Pap tests and screening for cancer for at least 20 years after your treatment. If Pap tests have been discontinued, your risk factors (such as having a new sexual partner) need to be reassessed to determine if screening should be resumed. Some women have medical problems that increase the chance of getting cervical cancer. In these cases, your health care provider may recommend more frequent screening and Pap tests.  The HPV test is an additional test that may be used for cervical cancer screening. The HPV test looks for the virus that can cause the cell changes on the cervix. The cells collected during the Pap test can be tested for HPV. The HPV test could be used to screen women aged 13 years and older, and should be used in women of any age  who have unclear Pap test results. After the age of 10, women should have HPV testing at the same frequency as a Pap test.  Colorectal cancer can be detected and often prevented. Most routine colorectal cancer screening begins at the age of 29 years and continues through age 41 years. However, your health care provider may recommend screening at an earlier age if you have risk factors for colon cancer. On a yearly basis, your health care provider may provide home test kits to check for hidden blood in the stool. Use of a small camera at the end of a tube, to directly examine the colon (sigmoidoscopy or colonoscopy), can detect the earliest forms of colorectal cancer. Talk to your health care provider about this at age 51, when routine screening begins. Direct exam of the colon should be repeated every 5-10 years through age 10 years, unless early forms of pre-cancerous polyps or small growths are found.  People who are at an increased risk for hepatitis B should be screened for this virus. You are considered at high risk for hepatitis B if:  You were born in a country where hepatitis B occurs often. Talk with your health care provider about which countries are considered high risk.  Your parents were born in a high-risk country and you have not received a shot to protect against hepatitis B (hepatitis B vaccine).  You have HIV or AIDS.  You use needles to inject street drugs.  You live with, or have sex with, someone who has hepatitis B.  You get hemodialysis treatment.  You take certain medicines for conditions like cancer, organ transplantation, and autoimmune conditions.  Hepatitis C blood testing is recommended for all people born from [birth year] through 1965 and any individual with known risks for hepatitis C.  Practice safe sex. Use condoms and avoid high-risk sexual practices to reduce the spread of sexually transmitted infections (STIs). STIs include gonorrhea, chlamydia, syphilis,  trichomonas, herpes, HPV, and human immunodeficiency virus (HIV). Herpes, HIV, and HPV are viral illnesses that have no cure. They can result in disability, cancer, and death.  You should be screened for sexually transmitted illnesses (STIs) including gonorrhea and chlamydia if:  You are sexually active and are younger than 24 years.  You are older than 24 years and your health care provider tells you that you are at risk for this type of infection.  Your sexual activity has changed since you  were last screened and you are at an increased risk for chlamydia or gonorrhea. Ask your health care provider if you are at risk.  If you are at risk of being infected with HIV, it is recommended that you take a prescription medicine daily to prevent HIV infection. This is called preexposure prophylaxis (PrEP). You are considered at risk if:  You are a heterosexual woman, are sexually active, and are at increased risk for HIV infection.  You take drugs by injection.  You are sexually active with a partner who has HIV.  Talk with your health care provider about whether you are at high risk of being infected with HIV. If you choose to begin PrEP, you should first be tested for HIV. You should then be tested every 3 months for as long as you are taking PrEP.  Osteoporosis is a disease in which the bones lose minerals and strength with aging. This can result in serious bone fractures or breaks. The risk of osteoporosis can be identified using a bone density scan. Women ages 46 years and over and women at risk for fractures or osteoporosis should discuss screening with their health care providers. Ask your health care provider whether you should take a calcium supplement or vitamin D to reduce the rate of osteoporosis.  Menopause can be associated with physical symptoms and risks. Hormone replacement therapy is available to decrease symptoms and risks. You should talk to your health care provider about whether  hormone replacement therapy is right for you.  Use sunscreen. Apply sunscreen liberally and repeatedly throughout the day. You should seek shade when your shadow is shorter than you. Protect yourself by wearing long sleeves, pants, a wide-brimmed hat, and sunglasses year round, whenever you are outdoors.  Once a month, do a whole body skin exam, using a mirror to look at the skin on your back. Tell your health care provider of new moles, moles that have irregular borders, moles that are larger than a pencil eraser, or moles that have changed in shape or color.  Stay current with required vaccines (immunizations).  Influenza vaccine. All adults should be immunized every year.  Tetanus, diphtheria, and acellular pertussis (Td, Tdap) vaccine. Pregnant women should receive 1 dose of Tdap vaccine during each pregnancy. The dose should be obtained regardless of the length of time since the last dose. Immunization is preferred during the 27th-36th week of gestation. An adult who has not previously received Tdap or who does not know her vaccine status should receive 1 dose of Tdap. This initial dose should be followed by tetanus and diphtheria toxoids (Td) booster doses every 10 years. Adults with an unknown or incomplete history of completing a 3-dose immunization series with Td-containing vaccines should begin or complete a primary immunization series including a Tdap dose. Adults should receive a Td booster every 10 years.  Varicella vaccine. An adult without evidence of immunity to varicella should receive 2 doses or a second dose if she has previously received 1 dose. Pregnant females who do not have evidence of immunity should receive the first dose after pregnancy. This first dose should be obtained before leaving the health care facility. The second dose should be obtained 4-8 weeks after the first dose.  Human papillomavirus (HPV) vaccine. Females aged 13-26 years who have not received the vaccine  previously should obtain the 3-dose series. The vaccine is not recommended for use in pregnant females. However, pregnancy testing is not needed before receiving a dose. If a female  is found to be pregnant after receiving a dose, no treatment is needed. In that case, the remaining doses should be delayed until after the pregnancy. Immunization is recommended for any person with an immunocompromised condition through the age of 74 years if she did not get any or all doses earlier. During the 3-dose series, the second dose should be obtained 4-8 weeks after the first dose. The third dose should be obtained 24 weeks after the first dose and 16 weeks after the second dose.  Zoster vaccine. One dose is recommended for adults aged 45 years or older unless certain conditions are present.  Measles, mumps, and rubella (MMR) vaccine. Adults born before [birth year] generally are considered immune to measles and mumps. Adults born in [birth year] or later should have 1 or more doses of MMR vaccine unless there is a contraindication to the vaccine or there is laboratory evidence of immunity to each of the three diseases. A routine second dose of MMR vaccine should be obtained at least 28 days after the first dose for students attending postsecondary schools, health care workers, or international travelers. People who received inactivated measles vaccine or an unknown type of measles vaccine during 1963-1967 should receive 2 doses of MMR vaccine. People who received inactivated mumps vaccine or an unknown type of mumps vaccine before 1979 and are at high risk for mumps infection should consider immunization with 2 doses of MMR vaccine. For females of childbearing age, rubella immunity should be determined. If there is no evidence of immunity, females who are not pregnant should be vaccinated. If there is no evidence of immunity, females who are pregnant should delay immunization until after pregnancy. Unvaccinated health care workers born  before [birth year] who lack laboratory evidence of measles, mumps, or rubella immunity or laboratory confirmation of disease should consider measles and mumps immunization with 2 doses of MMR vaccine or rubella immunization with 1 dose of MMR vaccine.  Pneumococcal 13-valent conjugate (PCV13) vaccine. When indicated, a person who is uncertain of her immunization history and has no record of immunization should receive the PCV13 vaccine. An adult aged 58 years or older who has certain medical conditions and has not been previously immunized should receive 1 dose of PCV13 vaccine. This PCV13 should be followed with a dose of pneumococcal polysaccharide (PPSV23) vaccine. The PPSV23 vaccine dose should be obtained at least 8 weeks after the dose of PCV13 vaccine. An adult aged 57 years or older who has certain medical conditions and previously received 1 or more doses of PPSV23 vaccine should receive 1 dose of PCV13. The PCV13 vaccine dose should be obtained 1 or more years after the last PPSV23 vaccine dose.  Pneumococcal polysaccharide (PPSV23) vaccine. When PCV13 is also indicated, PCV13 should be obtained first. All adults aged 27 years and older should be immunized. An adult younger than age 41 years who has certain medical conditions should be immunized. Any person who resides in a nursing home or long-term care facility should be immunized. An adult smoker should be immunized. People with an immunocompromised condition and certain other conditions should receive both PCV13 and PPSV23 vaccines. People with human immunodeficiency virus (HIV) infection should be immunized as soon as possible after diagnosis. Immunization during chemotherapy or radiation therapy should be avoided. Routine use of PPSV23 vaccine is not recommended for American Indians, [place] Natives, or people younger than 65 years unless there are medical conditions that require PPSV23 vaccine. When indicated, people who have unknown immunization and  have no record  of immunization should receive PPSV23 vaccine. One-time revaccination 5 years after the first dose of PPSV23 is recommended for people aged 19-64 years who have chronic kidney failure, nephrotic syndrome, asplenia, or immunocompromised conditions. People who received 1-2 doses of PPSV23 before age 60 years should receive another dose of PPSV23 vaccine at age 52 years or later if at least 5 years have passed since the previous dose. Doses of PPSV23 are not needed for people immunized with PPSV23 at or after age 101 years.  Meningococcal vaccine. Adults with asplenia or persistent complement component deficiencies should receive 2 doses of quadrivalent meningococcal conjugate (MenACWY-D) vaccine. The doses should be obtained at least 2 months apart. Microbiologists working with certain meningococcal bacteria, [employer] recruits, people at risk during an outbreak, and people who travel to or live in countries with a high rate of meningitis should be immunized. A first-year college student up through age 37 years who is living in a residence hall should receive a dose if she did not receive a dose on or after her 16th birthday. Adults who have certain high-risk conditions should receive one or more doses of vaccine.  Hepatitis A vaccine. Adults who wish to be protected from this disease, have certain high-risk conditions, work with hepatitis A-infected animals, work in hepatitis A research labs, or travel to or work in countries with a high rate of hepatitis A should be immunized. Adults who were previously unvaccinated and who anticipate close contact with an international adoptee during the first 60 days after arrival in the [place] from a country with a high rate of hepatitis A should be immunized.  Hepatitis B vaccine. Adults who wish to be protected from this disease, have certain high-risk conditions, may be exposed to blood or other infectious body fluids, are household contacts or sex  partners of hepatitis B positive people, are clients or workers in certain care facilities, or travel to or work in countries with a high rate of hepatitis B should be immunized.  Haemophilus influenzae type b (Hib) vaccine. A previously unvaccinated person with asplenia or sickle cell disease or having a scheduled splenectomy should receive 1 dose of Hib vaccine. Regardless of previous immunization, a recipient of a hematopoietic stem cell transplant should receive a 3-dose series 6-12 months after her successful transplant. Hib vaccine is not recommended for adults with HIV infection. Preventive Services / Frequency Ages 20 to 38 years  Blood pressure check.** / Every 1 to 2 years.  Lipid and cholesterol check.** / Every 5 years beginning at age 4.  Clinical breast exam.** / Every 3 years for women in their 7s and 8s.  BRCA-related cancer risk assessment.** / For women who have family members with a BRCA-related cancer (breast, ovarian, tubal, or peritoneal cancers).  Pap test.** / Every 2 years from ages 35 through 77. Every 3 years starting at age 36 through age 7 or 37 with a history of 3 consecutive normal Pap tests.  HPV screening.** / Every 3 years from ages 34 through ages 69 to 51 with a history of 3 consecutive normal Pap tests.  Hepatitis C blood test.** / For any individual with known risks for hepatitis C.  Skin self-exam. / Monthly.  Influenza vaccine. / Every year.  Tetanus, diphtheria, and acellular pertussis (Tdap, Td) vaccine.** / Consult your health care provider. Pregnant women should receive 1 dose of Tdap vaccine during each pregnancy. 1 dose of Td every 10 years.  Varicella vaccine.** / Consult your health care provider.  Pregnant females who do not have evidence of immunity should receive the first dose after pregnancy.  HPV vaccine. / 3 doses over 6 months, if 56 and younger. The vaccine is not recommended for use in pregnant females. However, pregnancy  testing is not needed before receiving a dose.  Measles, mumps, rubella (MMR) vaccine.** / You need at least 1 dose of MMR if you were born in 1957 or later. You may also need a 2nd dose. For females of childbearing age, rubella immunity should be determined. If there is no evidence of immunity, females who are not pregnant should be vaccinated. If there is no evidence of immunity, females who are pregnant should delay immunization until after pregnancy.  Pneumococcal 13-valent conjugate (PCV13) vaccine.** / Consult your health care provider.  Pneumococcal polysaccharide (PPSV23) vaccine.** / 1 to 2 doses if you smoke cigarettes or if you have certain conditions.  Meningococcal vaccine.** / 1 dose if you are age 32 to 13 years and a Market researcher living in a residence hall, or have one of several medical conditions, you need to get vaccinated against meningococcal disease. You may also need additional booster doses.  Hepatitis A vaccine.** / Consult your health care provider.  Hepatitis B vaccine.** / Consult your health care provider.  Haemophilus influenzae type b (Hib) vaccine.** / Consult your health care provider. Ages 39 to 5 years  Blood pressure check.** / Every 1 to 2 years.  Lipid and cholesterol check.** / Every 5 years beginning at age 53 years.  Lung cancer screening. / Every year if you are aged 36-80 years and have a 30-pack-year history of smoking and currently smoke or have quit within the past 15 years. Yearly screening is stopped once you have quit smoking for at least 15 years or develop a health problem that would prevent you from having lung cancer treatment.  Clinical breast exam.** / Every year after age 29 years.  BRCA-related cancer risk assessment.** / For women who have family members with a BRCA-related cancer (breast, ovarian, tubal, or peritoneal cancers).  Mammogram.** / Every year beginning at age 36 years and continuing for as long as you  are in good health. Consult with your health care provider.  Pap test.** / Every 3 years starting at age 80 years through age 51 or 34 years with a history of 3 consecutive normal Pap tests.  HPV screening.** / Every 3 years from ages 41 years through ages 51 to 53 years with a history of 3 consecutive normal Pap tests.  Fecal occult blood test (FOBT) of stool. / Every year beginning at age 60 years and continuing until age 83 years. You may not need to do this test if you get a colonoscopy every 10 years.  Flexible sigmoidoscopy or colonoscopy.** / Every 5 years for a flexible sigmoidoscopy or every 10 years for a colonoscopy beginning at age 72 years and continuing until age 3 years.  Hepatitis C blood test.** / For all people born from [birth year] through 1966 and any individual with known risks for hepatitis C.  Skin self-exam. / Monthly.  Influenza vaccine. / Every year.  Tetanus, diphtheria, and acellular pertussis (Tdap/Td) vaccine.** / Consult your health care provider. Pregnant women should receive 1 dose of Tdap vaccine during each pregnancy. 1 dose of Td every 10 years.  Varicella vaccine.** / Consult your health care provider. Pregnant females who do not have evidence of immunity should receive the first dose after pregnancy.  Zoster vaccine.** /  1 dose for adults aged 101 years or older.  Measles, mumps, rubella (MMR) vaccine.** / You need at least 1 dose of MMR if you were born in 1957 or later. You may also need a 2nd dose. For females of childbearing age, rubella immunity should be determined. If there is no evidence of immunity, females who are not pregnant should be vaccinated. If there is no evidence of immunity, females who are pregnant should delay immunization until after pregnancy.  Pneumococcal 13-valent conjugate (PCV13) vaccine.** / Consult your health care provider.  Pneumococcal polysaccharide (PPSV23) vaccine.** / 1 to 2 doses if you smoke cigarettes or if you have  certain conditions.  Meningococcal vaccine.** / Consult your health care provider.  Hepatitis A vaccine.** / Consult your health care provider.  Hepatitis B vaccine.** / Consult your health care provider.  Haemophilus influenzae type b (Hib) vaccine.** / Consult your health care provider. Ages 25 years and over  Blood pressure check.** / Every 1 to 2 years.  Lipid and cholesterol check.** / Every 5 years beginning at age 43 years.  Lung cancer screening. / Every year if you are aged 6-80 years and have a 30-pack-year history of smoking and currently smoke or have quit within the past 15 years. Yearly screening is stopped once you have quit smoking for at least 15 years or develop a health problem that would prevent you from having lung cancer treatment.  Clinical breast exam.** / Every year after age 61 years.  BRCA-related cancer risk assessment.** / For women who have family members with a BRCA-related cancer (breast, ovarian, tubal, or peritoneal cancers).  Mammogram.** / Every year beginning at age 59 years and continuing for as long as you are in good health. Consult with your health care provider.  Pap test.** / Every 3 years starting at age 81 years through age 14 or 13 years with 3 consecutive normal Pap tests. Testing can be stopped between 65 and 70 years with 3 consecutive normal Pap tests and no abnormal Pap or HPV tests in the past 10 years.  HPV screening.** / Every 3 years from ages 51 years through ages 70 or 76 years with a history of 3 consecutive normal Pap tests. Testing can be stopped between 65 and 70 years with 3 consecutive normal Pap tests and no abnormal Pap or HPV tests in the past 10 years.  Fecal occult blood test (FOBT) of stool. / Every year beginning at age 75 years and continuing until age 53 years. You may not need to do this test if you get a colonoscopy every 10 years.  Flexible sigmoidoscopy or colonoscopy.** / Every 5 years for a flexible  sigmoidoscopy or every 10 years for a colonoscopy beginning at age 74 years and continuing until age 51 years.  Hepatitis C blood test.** / For all people born from [birth year] through 1966 and any individual with known risks for hepatitis C.  Osteoporosis screening.** / A one-time screening for women ages 47 years and over and women at risk for fractures or osteoporosis.  Skin self-exam. / Monthly.  Influenza vaccine. / Every year.  Tetanus, diphtheria, and acellular pertussis (Tdap/Td) vaccine.** / 1 dose of Td every 10 years.  Varicella vaccine.** / Consult your health care provider.  Zoster vaccine.** / 1 dose for adults aged 49 years or older.  Pneumococcal 13-valent conjugate (PCV13) vaccine.** / Consult your health care provider.  Pneumococcal polysaccharide (PPSV23) vaccine.** / 1 dose for all adults aged 60 years and older.  Meningococcal vaccine.** / Consult your health care provider.  Hepatitis A vaccine.** / Consult your health care provider.  Hepatitis B vaccine.** / Consult your health care provider.  Haemophilus influenzae type b (Hib) vaccine.** / Consult your health care provider. ** Family history and personal history of risk and conditions may change your health care provider's recommendations. Document Released: 12/07/2001 Document Revised: 02/25/2014 Document Reviewed: 03/08/2011 [facility] Patient Information 2015 [place], [place]. This information is not intended to replace advice given to you by your health care provider. Make sure you discuss any questions you have with your health care provider.

## 2014-12-30 NOTE — Telephone Encounter (Signed)
done

## 2015-01-13 ENCOUNTER — Other Ambulatory Visit: Payer: Self-pay | Admitting: Nurse Practitioner

## 2015-01-13 ENCOUNTER — Other Ambulatory Visit: Payer: Medicare Other

## 2015-01-13 ENCOUNTER — Telehealth: Payer: Self-pay | Admitting: Family Medicine

## 2015-01-13 DIAGNOSIS — Z1212 Encounter for screening for malignant neoplasm of rectum: Secondary | ICD-10-CM | POA: Diagnosis not present

## 2015-01-13 MED ORDER — GLUCOSE BLOOD VI STRP: ORAL_STRIP | Status: DC

## 2015-01-13 NOTE — Telephone Encounter (Signed)
Patient aware rx sent to pharmacy.  

## 2015-01-13 NOTE — Progress Notes (Signed)
Lab only 

## 2015-01-16 LAB — FECAL OCCULT BLOOD, IMMUNOCHEMICAL: Fecal Occult Bld: NEGATIVE

## 2015-01-18 ENCOUNTER — Other Ambulatory Visit: Payer: Self-pay | Admitting: *Deleted

## 2015-01-18 MED ORDER — FLUCONAZOLE 150 MG PO TABS: 150.0000 mg | ORAL_TABLET | Freq: Once | ORAL | Status: DC

## 2015-01-22 ENCOUNTER — Other Ambulatory Visit: Payer: Medicare Other

## 2015-01-22 DIAGNOSIS — R197 Diarrhea, unspecified: Secondary | ICD-10-CM | POA: Diagnosis not present

## 2015-01-22 NOTE — Progress Notes (Signed)
Lab only 

## 2015-01-23 DIAGNOSIS — B351 Tinea unguium: Secondary | ICD-10-CM | POA: Diagnosis not present

## 2015-01-23 DIAGNOSIS — E1142 Type 2 diabetes mellitus with diabetic polyneuropathy: Secondary | ICD-10-CM | POA: Diagnosis not present

## 2015-01-23 DIAGNOSIS — L84 Corns and callosities: Secondary | ICD-10-CM | POA: Diagnosis not present

## 2015-01-24 LAB — CLOSTRIDIUM DIFFICILE BY PCR: Toxigenic C. Difficile by PCR: NEGATIVE

## 2015-01-27 LAB — STOOL CULTURE: E coli, Shiga toxin Assay: NEGATIVE

## 2015-01-28 DIAGNOSIS — N3941 Urge incontinence: Secondary | ICD-10-CM | POA: Diagnosis not present

## 2015-01-28 DIAGNOSIS — R350 Frequency of micturition: Secondary | ICD-10-CM | POA: Diagnosis not present

## 2015-01-28 DIAGNOSIS — N390 Urinary tract infection, site not specified: Secondary | ICD-10-CM | POA: Diagnosis not present

## 2015-02-13 ENCOUNTER — Other Ambulatory Visit: Payer: Self-pay

## 2015-02-13 ENCOUNTER — Telehealth: Payer: Self-pay | Admitting: Family Medicine

## 2015-02-13 DIAGNOSIS — F32A Depression, unspecified: Secondary | ICD-10-CM

## 2015-02-13 DIAGNOSIS — F329 Major depressive disorder, single episode, unspecified: Secondary | ICD-10-CM

## 2015-02-13 NOTE — Telephone Encounter (Signed)
Last seen 11/27/14 Dr Sabra Heck   This needs to be printed for patient for mail order

## 2015-02-14 MED ORDER — ESOMEPRAZOLE MAGNESIUM 20 MG PO CPDR: 20.0000 mg | DELAYED_RELEASE_CAPSULE | Freq: Every day | ORAL | Status: DC

## 2015-02-14 MED ORDER — ESCITALOPRAM OXALATE 20 MG PO TABS: 10.0000 mg | ORAL_TABLET | Freq: Every day | ORAL | Status: DC

## 2015-02-14 MED ORDER — DULOXETINE HCL 60 MG PO CPEP: 60.0000 mg | ORAL_CAPSULE | Freq: Every day | ORAL | Status: DC

## 2015-02-14 MED ORDER — DIPHENOXYLATE-ATROPINE 2.5-0.025 MG PO TABS: 1.0000 | ORAL_TABLET | Freq: Four times a day (QID) | ORAL | Status: DC | PRN

## 2015-02-14 NOTE — Telephone Encounter (Signed)
Patient aware that prescriptions are ready.

## 2015-02-14 NOTE — Addendum Note (Signed)
Addended by: Ilean China on: 02/14/2015 10:36 AM   Modules accepted: Orders

## 2015-02-14 NOTE — Addendum Note (Signed)
Addended by: Ilean China on: 02/14/2015 10:06 AM   Modules accepted: Orders

## 2015-03-07 ENCOUNTER — Other Ambulatory Visit: Payer: Self-pay | Admitting: Family Medicine

## 2015-03-07 MED ORDER — FLUCONAZOLE 150 MG PO TABS: 150.0000 mg | ORAL_TABLET | Freq: Once | ORAL | Status: DC

## 2015-03-07 NOTE — Telephone Encounter (Signed)
Diflucan rx sent to pharmacy.

## 2015-03-10 NOTE — Telephone Encounter (Signed)
Patient aware.

## 2015-03-19 ENCOUNTER — Other Ambulatory Visit: Payer: Self-pay | Admitting: Nurse Practitioner

## 2015-03-26 ENCOUNTER — Ambulatory Visit (INDEPENDENT_AMBULATORY_CARE_PROVIDER_SITE_OTHER): Payer: Medicare Other | Admitting: Ophthalmology

## 2015-03-26 DIAGNOSIS — H43813 Vitreous degeneration, bilateral: Secondary | ICD-10-CM | POA: Diagnosis not present

## 2015-03-26 DIAGNOSIS — E11319 Type 2 diabetes mellitus with unspecified diabetic retinopathy without macular edema: Secondary | ICD-10-CM | POA: Diagnosis not present

## 2015-03-26 DIAGNOSIS — H35033 Hypertensive retinopathy, bilateral: Secondary | ICD-10-CM | POA: Diagnosis not present

## 2015-03-26 DIAGNOSIS — E11329 Type 2 diabetes mellitus with mild nonproliferative diabetic retinopathy without macular edema: Secondary | ICD-10-CM | POA: Diagnosis not present

## 2015-03-26 DIAGNOSIS — E11339 Type 2 diabetes mellitus with moderate nonproliferative diabetic retinopathy without macular edema: Secondary | ICD-10-CM

## 2015-03-26 DIAGNOSIS — I10 Essential (primary) hypertension: Secondary | ICD-10-CM | POA: Diagnosis not present

## 2015-04-03 ENCOUNTER — Other Ambulatory Visit (INDEPENDENT_AMBULATORY_CARE_PROVIDER_SITE_OTHER): Payer: Medicare Other | Admitting: Ophthalmology

## 2015-04-07 ENCOUNTER — Other Ambulatory Visit (INDEPENDENT_AMBULATORY_CARE_PROVIDER_SITE_OTHER): Payer: Medicare Other | Admitting: Ophthalmology

## 2015-04-07 DIAGNOSIS — E11331 Type 2 diabetes mellitus with moderate nonproliferative diabetic retinopathy with macular edema: Secondary | ICD-10-CM | POA: Diagnosis not present

## 2015-04-07 DIAGNOSIS — E11311 Type 2 diabetes mellitus with unspecified diabetic retinopathy with macular edema: Secondary | ICD-10-CM | POA: Diagnosis not present

## 2015-04-17 DIAGNOSIS — E1142 Type 2 diabetes mellitus with diabetic polyneuropathy: Secondary | ICD-10-CM | POA: Diagnosis not present

## 2015-04-17 DIAGNOSIS — L84 Corns and callosities: Secondary | ICD-10-CM | POA: Diagnosis not present

## 2015-04-17 DIAGNOSIS — B351 Tinea unguium: Secondary | ICD-10-CM | POA: Diagnosis not present

## 2015-05-02 ENCOUNTER — Other Ambulatory Visit: Payer: Self-pay | Admitting: Family Medicine

## 2015-05-02 MED ORDER — FLUCONAZOLE 150 MG PO TABS: 150.0000 mg | ORAL_TABLET | Freq: Once | ORAL | Status: DC

## 2015-05-02 NOTE — Telephone Encounter (Signed)
Aware, script sent in. 

## 2015-05-02 NOTE — Telephone Encounter (Signed)
Diflucan rx sent to pharmacy.

## 2015-05-02 NOTE — Telephone Encounter (Signed)
Please advise 

## 2015-05-09 ENCOUNTER — Telehealth: Payer: Self-pay | Admitting: Nurse Practitioner

## 2015-05-09 NOTE — Telephone Encounter (Signed)
Pt is having vaginal burning with itching Would like Diflucan She is requesting 2 tablets instead of 1 Please advise

## 2015-05-12 MED ORDER — FLUCONAZOLE 150 MG PO TABS: 150.0000 mg | ORAL_TABLET | Freq: Once | ORAL | Status: DC

## 2015-05-13 ENCOUNTER — Encounter: Payer: Self-pay | Admitting: Family Medicine

## 2015-05-13 ENCOUNTER — Ambulatory Visit (INDEPENDENT_AMBULATORY_CARE_PROVIDER_SITE_OTHER): Payer: Medicare Other | Admitting: Family Medicine

## 2015-05-13 VITALS — BP 110/55 | HR 72 | Temp 96.9°F | Ht 63.25 in | Wt 194.0 lb

## 2015-05-13 DIAGNOSIS — E785 Hyperlipidemia, unspecified: Secondary | ICD-10-CM

## 2015-05-13 DIAGNOSIS — E0821 Diabetes mellitus due to underlying condition with diabetic nephropathy: Secondary | ICD-10-CM | POA: Diagnosis not present

## 2015-05-13 DIAGNOSIS — I10 Essential (primary) hypertension: Secondary | ICD-10-CM | POA: Diagnosis not present

## 2015-05-13 DIAGNOSIS — E1169 Type 2 diabetes mellitus with other specified complication: Secondary | ICD-10-CM | POA: Diagnosis not present

## 2015-05-13 MED ORDER — FLUCONAZOLE 150 MG PO TABS: ORAL_TABLET | ORAL | Status: DC

## 2015-05-13 MED ORDER — LOSARTAN POTASSIUM 50 MG PO TABS: 50.0000 mg | ORAL_TABLET | Freq: Every day | ORAL | Status: DC

## 2015-05-13 NOTE — Patient Instructions (Signed)
Medicare Annual Wellness Visit  [facility] and the medical providers at [facility] strive to bring you the best medical care.  In doing so we not only want to address your current medical conditions and concerns but also to detect new conditions early and prevent illness, disease and health-related problems.    Medicare offers a yearly Wellness Visit which allows our clinical staff to assess your need for preventative services including immunizations, lifestyle education, counseling to decrease risk of preventable diseases and screening for fall risk and other medical concerns.    This visit is provided free of charge (no copay) for all Medicare recipients. The clinical pharmacists at [facility] have begun to conduct these Wellness Visits which will also include a thorough review of all your medications.    As you primary medical provider recommend that you make an appointment for your Annual Wellness Visit if you have not done so already this year.  You may set up this appointment before you leave today or you may call back ([phone]) and schedule an appointment.  Please make sure when you call that you mention that you are scheduling your Annual Wellness Visit with the clinical pharmacist so that the appointment may be made for the proper length of time.     Continue current medications. Continue good therapeutic lifestyle changes which include good diet and exercise. Fall precautions discussed with patient. If an FOBT was given today- please return it to our front desk. If you are over 50 years old - you may need Prevnar 13 or the adult Pneumonia vaccine.  Flu Shots are still available at our office. If you still haven't had one please call to set up a nurse visit to get one.   After your visit with us today you will receive a survey in the mail or online from [employer] regarding your care with us. Please take a moment to  fill this out. Your feedback is very important to us as you can help us better understand your patient needs as well as improve your experience and satisfaction. WE CARE ABOUT YOU!!!   

## 2015-05-13 NOTE — Progress Notes (Signed)
Subjective:    Patient ID: [name], female    DOB: 1941/08/25, 74 y.o.   MRN: [id number]  HPI Pt here for follow up and management of chronic medical problems which includes hypertension and diabetes. She is taking medications regularly.  74 year old lady here to recheck her diabetes, lipids, and blood pressure. She brings in a record of her sugars. Generally these have been high in the morning and we talked about how she might titrate the Lantus up by 4 units a week based on 7 day average. She also complains of a yeast infection in the skin folds which itches and burns she had given was given 1 pill of Diflucan but that really did not take care of the problem. Looking over her medicines do not understand why she is not on ace orarbm for renal protection.     Patient Active Problem List   Diagnosis Date Noted  . Obesity (BMI 30-39.9) 07/04/2014  . Aneurysm of iliac artery 01/07/2014  . GERD (gastroesophageal reflux disease) 03/23/2013  . Type 2 diabetes mellitus with insulin therapy 03/23/2013  . Insomnia 03/23/2013  . Other malaise and fatigue 03/23/2013  . MITRAL VALVE PROLAPSE 07/17/2009  . Lichen planus 31/51/7616  . FIBROMYALGIA 07/17/2009  . VAGINITIS 04/04/2009  . URINARY INCONTINENCE 03/14/2009  . CHRONIC ULCER OF OTHER SPECIFIED SITE 12/24/2008  . Irritable bowel syndrome 08/29/2008  . BACK PAIN, LUMBAR 07/17/2008  . ACUTE CYSTITIS 01/24/2008  . UNSPECIFIED OSTEOPOROSIS 01/10/2008  . Dyslipidemia associated with type 2 diabetes mellitus 05/22/2007  . DEPRESSION 05/22/2007  . Essential hypertension 05/22/2007   Outpatient Encounter Prescriptions as of 05/13/2015  Medication Sig  . antiseptic oral rinse (BIOTENE) LIQD 15 mLs by Mouth Rinse route as needed for dry mouth.  Marland Kitchen aspirin 81 MG tablet Take 81 mg by mouth daily.    Marland Kitchen atorvastatin (LIPITOR) 20 MG tablet Take 0.5 tablets (10 mg total) by mouth at bedtime.  . Calcium Carb-Cholecalciferol (CALCIUM 600+D3)  600-200 MG-UNIT TABS Take 1 tablet by mouth 2 (two) times daily.    . clidinium-chlordiazePOXIDE (LIBRAX) 5-2.5 MG per capsule Take 1 capsule by mouth 2 (two) times daily as needed.   . desonide (DESOWEN) 0.05 % ointment Apply 1 application topically 2 (two) times daily.   . diphenoxylate-atropine (LOMOTIL) 2.5-0.025 MG per tablet Take 1 tablet by mouth 4 (four) times daily as needed for diarrhea or loose stools.  . DULoxetine (CYMBALTA) 60 MG capsule Take 1 capsule (60 mg total) by mouth daily.  Marland Kitchen escitalopram (LEXAPRO) 20 MG tablet Take 0.5 tablets (10 mg total) by mouth daily.  Marland Kitchen esomeprazole (NEXIUM) 20 MG capsule Take 1 capsule (20 mg total) by mouth daily at 12 noon.  Marland Kitchen estradiol (ESTRACE) 0.1 MG/GM vaginal cream Place 2 g vaginally every Monday, Wednesday, and Friday.   Marland Kitchen glucose blood (FREESTYLE LITE) test strip Use qid to check blood sugar. Dx E11.9  . insulin aspart (NOVOLOG FLEXPEN) 100 UNIT/ML FlexPen Inject prior to each meal per sliding scale.BG less than 80 - no insulin; 80 to 120 - 4 units; 121 to 150 - 6 units; 151 to 200 - 7 units; 201 to 250 - 8 units; 250 or above - 9 units and call office.  . Insulin Glargine (LANTUS SOLOSTAR) 100 UNIT/ML Solostar Pen Inject 44 Units into the skin every morning.  . Insulin Pen Needle (NOVOFINE) 30G X 8 MM MISC Use to inject insulin up to qid.  . metoprolol succinate (TOPROL-XL) 25 MG 24 hr  tablet Take 0.5 tablets (12.5 mg total) by mouth daily.  . mirabegron ER (MYRBETRIQ) 50 MG TB24 tablet Take 50 mg by mouth daily.  . Multiple Vitamin (MULTIVITAMIN) tablet Take 0.5 tablets by mouth 2 (two) times daily.   . traZODone (DESYREL) 50 MG tablet Take 1 tablet (50 mg total) by mouth at bedtime as needed for sleep.  . triamterene-hydrochlorothiazide (MAXZIDE-25) 37.5-25 MG per tablet Take 0.5 tablets by mouth daily.  . [DISCONTINUED] cephALEXin (KEFLEX) 250 MG capsule Take 250 mg by mouth daily.  . [DISCONTINUED] fluconazole (DIFLUCAN) 150 MG tablet  TAKE 1 TABLET (150 MG TOTAL) BY MOUTH ONCE. REPEAT IN 1 WEEK IF NEEDED  . [DISCONTINUED] fluconazole (DIFLUCAN) 150 MG tablet Take 1 tablet (150 mg total) by mouth once.  . [DISCONTINUED] fluticasone (FLONASE) 50 MCG/ACT nasal spray Place 1 spray into both nostrils daily.  . [DISCONTINUED] naproxen sodium (ANAPROX) 220 MG tablet Take 220 mg by mouth 2 (two) times daily with a meal.   No facility-administered encounter medications on file as of 05/13/2015.      Review of Systems  Constitutional: Negative.   HENT: Negative.   Eyes: Negative.   Respiratory: Negative.   Cardiovascular: Negative.   Gastrointestinal: Negative.   Endocrine: Negative.   Genitourinary: Negative.   Musculoskeletal: Negative.   Skin: Negative.        "folds itch and burn" - yeast ?  Allergic/Immunologic: Negative.   Neurological: Negative.   Hematological: Negative.   Psychiatric/Behavioral: Negative.        Objective:   Physical Exam  Constitutional: She is oriented to person, place, and time. She appears well-developed and well-nourished.  Eyes: Pupils are equal, round, and reactive to light.  Cardiovascular: Normal rate and regular rhythm.   Pulmonary/Chest: Effort normal and breath sounds normal.  Neurological: She is alert and oriented to person, place, and time.  Skin:  Summary red rash in skin folds axilla under breasts: Consistent with intertrigo    BP 110/55 mmHg  Pulse 72  Temp(Src) 96.9 F (36.1 C) (Oral)  Ht 5' 3.25" (1.607 m)  Wt 194 lb (87.998 kg)  BMI 34.08 kg/m2       Assessment & Plan:  1. Essential hypertension Blood pressure controlled on current regimen of Toprol and triamterene notably that would benefit her kidneys if we give her low-dose losartan - BMP8+EGFR  I2. Diabetes mellitus due to underlying condition with diabetic nephropathy A1c is likely to be elevated today. I have suggested already some titrations of her long and short acting insulin  3. Dyslipidemia  associated with type 2 diabetes mellitus Continue with Lipitor and recheck lipids at next visit  Stephen M Miller MD  

## 2015-05-14 ENCOUNTER — Telehealth: Payer: Self-pay | Admitting: Nurse Practitioner

## 2015-05-14 LAB — BMP8+EGFR
BUN / CREAT RATIO: 18 (ref 11–26)
BUN: 22 mg/dL (ref 8–27)
CALCIUM: 8.8 mg/dL (ref 8.7–10.3)
CO2: 29 mmol/L (ref 18–29)
Chloride: 95 mmol/L — ABNORMAL LOW (ref 97–108)
Creatinine, Ser: 1.21 mg/dL — ABNORMAL HIGH (ref 0.57–1.00)
GFR calc non Af Amer: 44 mL/min/{1.73_m2} — ABNORMAL LOW (ref >59)
GFR, EST AFRICAN AMERICAN: 51 mL/min/{1.73_m2} — AB (ref >59)
GLUCOSE: 98 mg/dL (ref 65–99)
Potassium: 4.8 mmol/L (ref 3.5–5.2)
Sodium: 137 mmol/L (ref 134–144)

## 2015-05-14 MED ORDER — FLUCONAZOLE 150 MG PO TABS: ORAL_TABLET | ORAL | Status: DC

## 2015-05-14 NOTE — Telephone Encounter (Signed)
Pt aware Rx sent to pharmacy 

## 2015-05-14 NOTE — Telephone Encounter (Signed)
Diflucan rx sent to pharamacy

## 2015-05-15 LAB — POCT GLYCOSYLATED HEMOGLOBIN (HGB A1C): Hemoglobin A1C: 7.2

## 2015-05-17 ENCOUNTER — Encounter: Payer: Self-pay | Admitting: Family

## 2015-05-17 ENCOUNTER — Ambulatory Visit (INDEPENDENT_AMBULATORY_CARE_PROVIDER_SITE_OTHER): Payer: Medicare Other | Admitting: Family

## 2015-05-17 VITALS — BP 119/57 | HR 79 | Temp 97.1°F | Ht 63.25 in | Wt 192.2 lb

## 2015-05-17 DIAGNOSIS — B372 Candidiasis of skin and nail: Secondary | ICD-10-CM | POA: Diagnosis not present

## 2015-05-17 DIAGNOSIS — N760 Acute vaginitis: Secondary | ICD-10-CM

## 2015-05-17 MED ORDER — NYSTATIN 100000 UNIT/GM EX POWD: CUTANEOUS | Status: DC

## 2015-05-17 MED ORDER — FLUCONAZOLE 150 MG PO TABS: 150.0000 mg | ORAL_TABLET | ORAL | Status: DC

## 2015-05-17 MED ORDER — NYSTATIN-TRIAMCINOLONE 100000-0.1 UNIT/GM-% EX OINT: 1.0000 "application " | TOPICAL_OINTMENT | Freq: Two times a day (BID) | CUTANEOUS | Status: DC

## 2015-05-17 NOTE — Patient Instructions (Signed)
Candida Infection A Candida infection (also called yeast, fungus, and Monilia infection) is an overgrowth of yeast that can occur anywhere on the body. A yeast infection commonly occurs in warm, moist body areas. Usually, the infection remains localized but can spread to become a systemic infection. A yeast infection may be a sign of a more severe disease such as diabetes, leukemia, or AIDS. A yeast infection can occur in both men and women. In women, Candida vaginitis is a vaginal infection. It is one of the most common causes of vaginitis. Men usually do not have symptoms or know they have an infection until other problems develop. Men may find out they have a yeast infection because their sex partner has a yeast infection. Uncircumcised men are more likely to get a yeast infection than circumcised men. This is because the uncircumcised glans is not exposed to air and does not remain as dry as that of a circumcised glans. Older adults may develop yeast infections around dentures. CAUSES  Women  Antibiotics.  Steroid medication taken for a long time.  Being overweight (obese).  Diabetes.  Poor immune condition.  Certain serious medical conditions.  Immune suppressive medications for organ transplant patients.  Chemotherapy.  Pregnancy.  Menstruation.  Stress and fatigue.  Intravenous drug use.  Oral contraceptives.  Wearing tight-fitting clothes in the crotch area.  Catching it from a sex partner who has a yeast infection.  Spermicide.  Intravenous, urinary, or other catheters. Men  Catching it from a sex partner who has a yeast infection.  Having oral or anal sex with a person who has the infection.  Spermicide.  Diabetes.  Antibiotics.  Poor immune system.  Medications that suppress the immune system.  Intravenous drug use.  Intravenous, urinary, or other catheters. SYMPTOMS  Women  Thick, white vaginal discharge.  Vaginal itching.  Redness and  swelling in and around the vagina.  Irritation of the lips of the vagina and perineum.  Blisters on the vaginal lips and perineum.  Painful sexual intercourse.  Low blood sugar (hypoglycemia).  Painful urination.  Bladder infections.  Intestinal problems such as constipation, indigestion, bad breath, bloating, increase in gas, diarrhea, or loose stools. Men  Men may develop intestinal problems such as constipation, indigestion, bad breath, bloating, increase in gas, diarrhea, or loose stools.  Dry, cracked skin on the penis with itching or discomfort.  Jock itch.  Dry, flaky skin.  Athlete's foot.  Hypoglycemia. DIAGNOSIS  Women  A history and an exam are performed.  The discharge may be examined under a microscope.  A culture may be taken of the discharge. Men  A history and an exam are performed.  Any discharge from the penis or areas of cracked skin will be looked at under the microscope and cultured.  Stool samples may be cultured. TREATMENT  Women  Vaginal antifungal suppositories and creams.  Medicated creams to decrease irritation and itching on the outside of the vagina.  Warm compresses to the perineal area to decrease swelling and discomfort.  Oral antifungal medications.  Medicated vaginal suppositories or cream for repeated or recurrent infections.  Wash and dry the irritation areas before applying the cream.  Eating yogurt with Lactobacillus may help with prevention and treatment.  Sometimes painting the vagina with gentian violet solution may help if creams and suppositories do not work. Men  Antifungal creams and oral antifungal medications.  Sometimes treatment must continue for 30 days after the symptoms go away to prevent recurrence. HOME CARE INSTRUCTIONS    Women  Use cotton underwear and avoid tight-fitting clothing.  Avoid colored, scented toilet paper and deodorant tampons or pads.  Do not douche.  Keep your diabetes  under control.  Finish all the prescribed medications.  Keep your skin clean and dry.  Consume milk or yogurt with Lactobacillus-active culture regularly. If you get frequent yeast infections and think that is what the infection is, there are over-the-counter medications that you can get. If the infection does not show healing in 3 days, talk to your caregiver.  Tell your sex partner you have a yeast infection. Your partner may need treatment also, especially if your infection does not clear up or recurs. Men  Keep your skin clean and dry.  Keep your diabetes under control.  Finish all prescribed medications.  Tell your sex partner that you have a yeast infection so he or she can be treated if necessary. SEEK MEDICAL CARE IF:   Your symptoms do not clear up or worsen in one week after treatment.  You have an oral temperature above 102 F (38.9 C).  You have trouble swallowing or eating for a prolonged time.  You develop blisters on and around your vagina.  You develop vaginal bleeding and it is not your menstrual period.  You develop abdominal pain.  You develop intestinal problems as mentioned above.  You get weak or light-headed.  You have painful or increased urination.  You have pain during sexual intercourse. MAKE SURE YOU:   Understand these instructions.  Will watch your condition.  Will get help right away if you are not doing well or get worse. Document Released: 11/18/2004 Document Revised: 02/25/2014 Document Reviewed: 03/02/2010 [facility] Patient Information 2015 [place], [place]. This information is not intended to replace advice given to you by your health care provider. Make sure you discuss any questions you have with your health care provider. Yeast Infection of the Skin Some yeast on the skin is normal, but sometimes it causes an infection. If you have a yeast infection, it shows up as white or light brown patches on brown skin. You can see it better  in the summer on tan skin. It causes light-colored holes in your suntan. It can happen on any area of the body. This cannot be passed from person to person. HOME CARE  Scrub your skin daily with a dandruff shampoo. Your rash may take a couple weeks to get well.  Do not scratch or itch the rash. GET HELP RIGHT AWAY IF:   You get another infection from scratching. The skin may get warm, red, and may ooze fluid.  The infection does not seem to be getting better. MAKE SURE YOU:  Understand these instructions.  Will watch your condition.  Will get help right away if you are not doing well or get worse. Document Released: 09/23/2008 Document Revised: 01/03/2012 Document Reviewed: 09/23/2008 [facility] Patient Information 2015 [place], [place]. This information is not intended to replace advice given to you by your health care provider. Make sure you discuss any questions you have with your health care provider.

## 2015-05-17 NOTE — Progress Notes (Signed)
Subjective:    Patient ID: [name], female    DOB: 01-25-1941, 74 y.o.   MRN: [id number]  Pt presents to the office today for recurrent yeast infection. Pt has been treated for vaginal yeast infection with 3 tabs of diflucan. Pt now reports she has a erythemas itching rash in her groin area that has spread to her rectum. Pt states she has tried antifungal powder and ant-itch OTC products with no relief.  Rash This is a new problem. The current episode started 1 to 4 weeks ago. The problem has been waxing and waning since onset. The affected locations include the groin (breasts). The rash is characterized by itchiness and redness. Pertinent negatives include no diarrhea, joint pain or shortness of breath. Past treatments include anti-itch cream. The treatment provided mild relief.      Review of Systems  Constitutional: Negative.   HENT: Negative.   Eyes: Negative.   Respiratory: Negative.  Negative for shortness of breath.   Cardiovascular: Negative.  Negative for palpitations.  Gastrointestinal: Negative.  Negative for diarrhea.  Endocrine: Negative.   Genitourinary: Negative.   Musculoskeletal: Negative.  Negative for joint pain.  Skin: Positive for rash.  Neurological: Negative.  Negative for headaches.  Hematological: Negative.   Psychiatric/Behavioral: Negative.   All other systems reviewed and are negative.      Objective:   Physical Exam  Constitutional: She is oriented to person, place, and time. She appears well-developed and well-nourished. No distress.  HENT:  Head: Normocephalic and atraumatic.  Right Ear: External ear normal.  Mouth/Throat: Oropharynx is clear and moist.  Eyes: Pupils are equal, round, and reactive to light.  Neck: Normal range of motion. Neck supple. No thyromegaly present.  Cardiovascular: Normal rate, regular rhythm, normal heart sounds and intact distal pulses.   No murmur heard. Pulmonary/Chest: Effort normal and breath sounds normal.  No respiratory distress. She has no wheezes.  Abdominal: Soft. Bowel sounds are normal. She exhibits no distension. There is no tenderness.  Musculoskeletal: Normal range of motion. She exhibits no edema or tenderness.  Neurological: She is alert and oriented to person, place, and time. She has normal reflexes. No cranial nerve deficit.  Skin: Skin is warm and dry. Rash noted. There is erythema.  Generalized erythemas rash in groin, vagina, rectum and gluteal fold- breakdown of skin in gluteal fold   Psychiatric: She has a normal mood and affect. Her behavior is normal. Judgment and thought content normal.  Vitals reviewed.  BP 119/57 mmHg  Pulse 79  Temp(Src) 97.1 F (36.2 C) (Oral)  Ht 5' 3.25" (1.607 m)  Wt 192 lb 3.2 oz (87.181 kg)  BMI 33.76 kg/m2        Assessment & Plan:  1. Vaginitis - fluconazole (DIFLUCAN) 150 MG tablet; Take 1 tablet (150 mg total) by mouth every 3 (three) days. Take one tablet every other day  Dispense: 3 tablet; Refill: 0 - nystatin-triamcinolone ointment (MYCOLOG); Apply 1 application topically 2 (two) times daily.  Dispense: 30 g; Refill: 0 - nystatin (MYCOSTATIN/NYSTOP) 100000 UNIT/GM POWD; Apply under bilateral breasts and groin BID  Dispense: 60 g; Refill: 3  2. Skin yeast infection - fluconazole (DIFLUCAN) 150 MG tablet; Take 1 tablet (150 mg total) by mouth every 3 (three) days. Take one tablet every other day  Dispense: 3 tablet; Refill: 0 - nystatin-triamcinolone ointment (MYCOLOG); Apply 1 application topically 2 (two) times daily.  Dispense: 30 g; Refill: 0 - nystatin (MYCOSTATIN/NYSTOP) 100000 UNIT/GM POWD; Apply under  bilateral breasts and groin BID  Dispense: 60 g; Refill: 3  Keep area clean and dry Do not scratch or pick at RTO in 1 week    Evelina Dun, FNP

## 2015-05-20 ENCOUNTER — Other Ambulatory Visit: Payer: Self-pay | Admitting: Family

## 2015-05-28 ENCOUNTER — Other Ambulatory Visit: Payer: Self-pay | Admitting: *Deleted

## 2015-05-28 ENCOUNTER — Telehealth: Payer: Self-pay | Admitting: Family

## 2015-05-28 DIAGNOSIS — B372 Candidiasis of skin and nail: Secondary | ICD-10-CM

## 2015-05-28 DIAGNOSIS — N760 Acute vaginitis: Secondary | ICD-10-CM

## 2015-05-28 MED ORDER — NYSTATIN-TRIAMCINOLONE 100000-0.1 UNIT/GM-% EX OINT: 1.0000 "application " | TOPICAL_OINTMENT | Freq: Two times a day (BID) | CUTANEOUS | Status: DC

## 2015-05-28 NOTE — Telephone Encounter (Signed)
Aware, script sent in for ointment.

## 2015-05-28 NOTE — Telephone Encounter (Signed)
Rash coming back. Please refill ointment.

## 2015-06-16 ENCOUNTER — Ambulatory Visit: Payer: Medicare Other | Admitting: Pediatrics

## 2015-06-19 ENCOUNTER — Encounter: Payer: Medicare Other | Admitting: Family

## 2015-06-23 ENCOUNTER — Ambulatory Visit: Payer: Medicare Other | Admitting: Pediatrics

## 2015-06-24 ENCOUNTER — Encounter: Payer: Self-pay | Admitting: Pediatrics

## 2015-06-24 ENCOUNTER — Ambulatory Visit (INDEPENDENT_AMBULATORY_CARE_PROVIDER_SITE_OTHER): Payer: Medicare Other | Admitting: Pediatrics

## 2015-06-24 VITALS — BP 130/72 | HR 78 | Temp 97.6°F | Ht 63.25 in | Wt 196.2 lb

## 2015-06-24 DIAGNOSIS — N3000 Acute cystitis without hematuria: Secondary | ICD-10-CM

## 2015-06-24 DIAGNOSIS — L439 Lichen planus, unspecified: Secondary | ICD-10-CM | POA: Diagnosis not present

## 2015-06-24 DIAGNOSIS — N3946 Mixed incontinence: Secondary | ICD-10-CM

## 2015-06-24 DIAGNOSIS — E119 Type 2 diabetes mellitus without complications: Secondary | ICD-10-CM

## 2015-06-24 DIAGNOSIS — I10 Essential (primary) hypertension: Secondary | ICD-10-CM

## 2015-06-24 DIAGNOSIS — Z794 Long term (current) use of insulin: Secondary | ICD-10-CM

## 2015-06-24 DIAGNOSIS — R399 Unspecified symptoms and signs involving the genitourinary system: Secondary | ICD-10-CM | POA: Diagnosis not present

## 2015-06-24 LAB — POCT URINALYSIS DIPSTICK
Bilirubin, UA: NEGATIVE
Blood, UA: NEGATIVE
Glucose, UA: NEGATIVE
Ketones, UA: NEGATIVE
Nitrite, UA: NEGATIVE
PH UA: 6
Protein, UA: NEGATIVE
Spec Grav, UA: 1.01
UROBILINOGEN UA: NEGATIVE

## 2015-06-24 LAB — POCT UA - MICROSCOPIC ONLY
Bacteria, U Microscopic: NEGATIVE
Casts, Ur, LPF, POC: NEGATIVE
Crystals, Ur, HPF, POC: NEGATIVE
MUCUS UA: NEGATIVE
RBC, urine, microscopic: NEGATIVE
YEAST UA: NEGATIVE

## 2015-06-24 NOTE — Progress Notes (Addendum)
Subjective:    Patient ID: [name], female    DOB: 05-09-1941, 74 y.o.   MRN: [id number]  HPI: [name] is a 74 y.o. female presenting on 06/24/2015 for Follow-up and Urinary Frequency  She has had increasing urinary frequency and urgency over the last few months and takes Myrbetriq which has helped some. Symptoms possibly slightly worse over the last 1-1.5 weeks. She doesn't always make it to the bathroom when she gets up to go. She also has h/o skin candidal infections and vulvar lichen planus that she uses topical estrace and desonide for. Itching has not been problem recently, but incontinence makes symptoms worse.   Diabetes: on SSI before meals that includes some meal time insulin dosing in addition to lantus that has recently increased from 44 to 48. Blood glucose levels have been similar to priors, a few numbers up in the 200s prior to meals, but fasting morning levels range low to mid 100s.    Relevant past medical, surgical, family and social history reviewed and updated as indicated. Interim medical history since our last visit reviewed. Allergies and medications reviewed and updated.   ROS: Per HPI unless specifically indicated above   Current Outpatient Prescriptions  Medication Sig Dispense Refill  . aspirin 81 MG tablet Take 81 mg by mouth daily.      Marland Kitchen atorvastatin (LIPITOR) 20 MG tablet Take 0.5 tablets (10 mg total) by mouth at bedtime. 45 tablet 3  . Calcium Carb-Cholecalciferol (CALCIUM 600+D3) 600-200 MG-UNIT TABS Take 1 tablet by mouth 2 (two) times daily.      . clidinium-chlordiazePOXIDE (LIBRAX) 5-2.5 MG per capsule Take 1 capsule by mouth 2 (two) times daily as needed.   1  . desonide (DESOWEN) 0.05 % ointment Apply 1 application topically 2 (two) times daily.     . diphenoxylate-atropine (LOMOTIL) 2.5-0.025 MG per tablet Take 1 tablet by mouth 4 (four) times daily as needed for diarrhea or loose stools. 120 tablet 1  . DULoxetine (CYMBALTA) 60 MG  capsule Take 1 capsule (60 mg total) by mouth daily. 90 capsule 1  . escitalopram (LEXAPRO) 20 MG tablet Take 0.5 tablets (10 mg total) by mouth daily. 90 tablet 1  . esomeprazole (NEXIUM) 20 MG capsule Take 1 capsule (20 mg total) by mouth daily at 12 noon. 90 capsule 1  . estradiol (ESTRACE) 0.1 MG/GM vaginal cream Place 2 g vaginally every Monday, Wednesday, and Friday.     Marland Kitchen glucose blood (FREESTYLE LITE) test strip Use qid to check blood sugar. Dx E11.9 200 each 3  . insulin aspart (NOVOLOG FLEXPEN) 100 UNIT/ML FlexPen Inject prior to each meal per sliding scale.BG less than 80 - no insulin; 80 to 120 - 4 units; 121 to 150 - 6 units; 151 to 200 - 7 units; 201 to 250 - 8 units; 250 or above - 9 units and call office. 30 mL 3  . Insulin Glargine (LANTUS SOLOSTAR) 100 UNIT/ML Solostar Pen Inject 44 Units into the skin every morning. 45 mL 3  . Insulin Pen Needle (NOVOFINE) 30G X 8 MM MISC Use to inject insulin up to qid. 400 each 3  . losartan (COZAAR) 50 MG tablet Take 1 tablet (50 mg total) by mouth daily. 90 tablet 3  . metoprolol succinate (TOPROL-XL) 25 MG 24 hr tablet Take 0.5 tablets (12.5 mg total) by mouth daily. 45 tablet 3  . mirabegron ER (MYRBETRIQ) 50 MG TB24 tablet Take 50 mg by mouth daily.    Marland Kitchen  Multiple Vitamin (MULTIVITAMIN) tablet Take 0.5 tablets by mouth 2 (two) times daily.     Marland Kitchen nystatin (MYCOSTATIN/NYSTOP) 100000 UNIT/GM POWD Apply under bilateral breasts and groin BID 60 g 3  . nystatin-triamcinolone ointment (MYCOLOG) Apply 1 application topically 2 (two) times daily. 30 g 3  . Polyethyl Glycol-Propyl Glycol 0.4-0.3 % SOLN Apply to eye.    . Probiotic Product (PROBIOTIC ADVANCED PO) Take by mouth.    . traZODone (DESYREL) 50 MG tablet Take 1 tablet (50 mg total) by mouth at bedtime as needed for sleep. 90 tablet 2  . triamterene-hydrochlorothiazide (MAXZIDE-25) 37.5-25 MG per tablet Take 0.5 tablets by mouth daily. 30 tablet 0   No current facility-administered  medications for this visit.       Objective:    BP 130/72 mmHg  Pulse 78  Temp(Src) 97.6 F (36.4 C) (Oral)  Ht 5' 3.25" (1.607 m)  Wt 196 lb 3.2 oz (88.996 kg)  BMI 34.46 kg/m2  Wt Readings from Last 3 Encounters:  06/24/15 196 lb 3.2 oz (88.996 kg)  05/17/15 192 lb 3.2 oz (87.181 kg)  05/13/15 194 lb (87.998 kg)      Gen: NAD, alert, cooperative with exam, NCAT EYES: EOMI, no scleral injection or icterus ENT:  OP without erythema LYMPH: no cervical LAD CV: NRRR, normal S1/S2, no murmur, DP pulses 2+ b/l Resp: CTABL, no wheezes, normal WOB Abd: +BS, soft, NTND. no guarding or organomegaly Ext: No edema, warm Neuro: Alert and oriented, strength equal b/l UE and LE, coordination grossly normal MSK: normal muscle bulk     Assessment & Plan:   No problem-specific assessment & plan notes found for this encounter.   [name] was seen today for follow-up and urinary frequency.  Diagnoses and all orders for this visit:  Lower urinary tract symptoms -     POCT urinalysis dipstick -     POCT UA - Microscopic Only -     Urine culture  Essential hypertension Well controlled today. At future visit will talk with pt about consolidating medicines as she is on partial doses of several antihypertensives.  Lichen planus No worsening of symptoms now. Continue desonide and estrace.  Mixed incontinence UA as above.  Type 2 diabetes mellitus with insulin therapy Continue insulin regimen, 48u lantus qmorning, SSI to include meal time insulin before each meal, no changes. Pt aware of hypoglycemia symptoms.   Follow up plan: In 2-3 weeks to go over medicines  Assunta Found, MD [facility] 06/24/2015, 6:11 PM  ADDENDUM:  E coli UTI. Sent in cipro 250mg  twice a day for three days.

## 2015-06-26 LAB — URINE CULTURE

## 2015-06-26 MED ORDER — CIPROFLOXACIN HCL 250 MG PO TABS: 250.0000 mg | ORAL_TABLET | Freq: Two times a day (BID) | ORAL | Status: DC

## 2015-06-26 NOTE — Addendum Note (Signed)
Addended by: Eustaquio Maize on: 06/26/2015 01:52 PM   Modules accepted: Orders, SmartSet

## 2015-07-01 ENCOUNTER — Encounter (INDEPENDENT_AMBULATORY_CARE_PROVIDER_SITE_OTHER): Payer: Self-pay

## 2015-07-01 ENCOUNTER — Encounter: Payer: Self-pay | Admitting: Physician Assistant

## 2015-07-01 ENCOUNTER — Ambulatory Visit (INDEPENDENT_AMBULATORY_CARE_PROVIDER_SITE_OTHER): Payer: Medicare Other | Admitting: Physician Assistant

## 2015-07-01 VITALS — BP 142/71 | HR 70 | Temp 97.3°F | Ht 63.25 in | Wt 197.0 lb

## 2015-07-01 DIAGNOSIS — R52 Pain, unspecified: Secondary | ICD-10-CM

## 2015-07-01 DIAGNOSIS — J069 Acute upper respiratory infection, unspecified: Secondary | ICD-10-CM | POA: Diagnosis not present

## 2015-07-01 DIAGNOSIS — J029 Acute pharyngitis, unspecified: Secondary | ICD-10-CM

## 2015-07-01 LAB — POCT INFLUENZA A/B
INFLUENZA B, POC: NEGATIVE
Influenza A, POC: NEGATIVE

## 2015-07-01 LAB — POCT RAPID STREP A (OFFICE): Rapid Strep A Screen: NEGATIVE

## 2015-07-01 MED ORDER — AZITHROMYCIN 250 MG PO TABS: ORAL_TABLET | ORAL | Status: DC

## 2015-07-01 NOTE — Progress Notes (Signed)
   Subjective:    Patient ID: [name], female    DOB: 22-Jun-1941, 74 y.o.   MRN: [id number]  HPI 74 y/o female with numerous comorbidities including DM. presents with c/o sore throat and upper respiratory symptoms x 1 weeks. She has tried tylenol, musinex and flonase with no relief.     Review of Systems  Constitutional: Positive for chills, diaphoresis and fatigue.  HENT: Positive for congestion (nasal anc chest ), postnasal drip, sneezing and sore throat. Negative for ear pain.   Eyes: Negative.   Respiratory: Positive for cough (nonproductive ). Negative for wheezing.   Cardiovascular: Negative.   Gastrointestinal: Negative.   Endocrine: Negative.   Genitourinary: Negative.   Musculoskeletal: Negative.   Skin: Negative.   Neurological: Negative.   Psychiatric/Behavioral: Negative.        Objective:   Physical Exam  Constitutional: She is oriented to person, place, and time. She appears well-developed and well-nourished. No distress.  HENT:  Head: Normocephalic.  Right Ear: External ear normal.  Left Ear: External ear normal.  Mouth/Throat: No oropharyngeal exudate.  Posterior pharynx erythema and injection bilaterally   Cardiovascular: Exam reveals no gallop and no friction rub.   No murmur heard. Pulmonary/Chest: Effort normal and breath sounds normal. No respiratory distress. She has no wheezes. She has no rales. She exhibits no tenderness.  Musculoskeletal: She exhibits no edema.  Neurological: She is alert and oriented to person, place, and time.  Skin: She is not diaphoretic.  Psychiatric: She has a normal mood and affect. Her behavior is normal. Judgment and thought content normal.  Nursing note and vitals reviewed.         Assessment & Plan:  1. Sore throat  - POCT rapid strep A - Upper Respiratory Culture, Routine  2. Body aches - Tylenol for fever, body aches  - POCT Influenza A/B  3. Acute upper respiratory infection - Musinex plain otc as  directed - Flonase as directed - azithromycin (ZITHROMAX) 250 MG tablet; 2 tablets on day 1 , 1 tablet on day 2-5  Dispense: 6 tablet; Refill: 0  RTO if s/s worsen or do not improve  Jasier Calabretta A. Benjamin Stain PA-C

## 2015-07-01 NOTE — Patient Instructions (Signed)
Flonase - 2 sprays each nostril once daily Zyrtec or Claritin as directed  Plain musinex ( blue/white box)   Plenty of fluids

## 2015-07-03 LAB — UPPER RESPIRATORY CULTURE, ROUTINE

## 2015-07-04 ENCOUNTER — Telehealth: Payer: Self-pay | Admitting: Pediatrics

## 2015-07-04 DIAGNOSIS — J069 Acute upper respiratory infection, unspecified: Secondary | ICD-10-CM

## 2015-07-04 MED ORDER — BENZONATATE 200 MG PO CAPS: 200.0000 mg | ORAL_CAPSULE | Freq: Three times a day (TID) | ORAL | Status: DC | PRN

## 2015-07-04 NOTE — Telephone Encounter (Signed)
Sent in [place]. If she is not feeling better by early next week, she should come back in to be seen. Thanks, Arbie Cookey

## 2015-07-04 NOTE — Telephone Encounter (Signed)
Please review and advise.

## 2015-07-04 NOTE — Telephone Encounter (Signed)
Patient notified

## 2015-07-08 ENCOUNTER — Ambulatory Visit (INDEPENDENT_AMBULATORY_CARE_PROVIDER_SITE_OTHER): Payer: Medicare Other | Admitting: Family

## 2015-07-08 ENCOUNTER — Encounter: Payer: Self-pay | Admitting: Family

## 2015-07-08 ENCOUNTER — Ambulatory Visit: Payer: Medicare Other | Admitting: Nurse Practitioner

## 2015-07-08 VITALS — BP 113/70 | HR 64 | Temp 97.4°F | Ht 63.25 in | Wt 198.4 lb

## 2015-07-08 DIAGNOSIS — J069 Acute upper respiratory infection, unspecified: Secondary | ICD-10-CM | POA: Diagnosis not present

## 2015-07-08 MED ORDER — METHYLPREDNISOLONE 4 MG PO TBPK: ORAL_TABLET | ORAL | Status: DC

## 2015-07-08 NOTE — Progress Notes (Signed)
Subjective:    Patient ID: [name], female    DOB: 03-09-1941, 74 y.o.   MRN: [id number]  Pt presents to the office today for recurrent URI symptoms. Pt was seen one week ago and give Zpak, tessalon pearls, claritin, and mucinex. PT states she has completed the Zpak, but continues taking the other medications with mild relief.  URI  This is a recurrent problem. The current episode started 1 to 4 weeks ago. The problem has been waxing and waning. Associated symptoms include congestion, coughing, headaches, sneezing, a sore throat and swollen glands. Pertinent negatives include no ear pain, rhinorrhea, sinus pain, vomiting or wheezing. She has tried antihistamine and decongestant (tessalon and zpak) for the symptoms. The treatment provided mild relief.  Cough Associated symptoms include headaches and a sore throat. Pertinent negatives include no ear pain, rhinorrhea, shortness of breath or wheezing.  Sore Throat  Associated symptoms include congestion, coughing, headaches and swollen glands. Pertinent negatives include no ear pain, shortness of breath or vomiting.      Review of Systems  Constitutional: Negative.   HENT: Positive for congestion, sneezing and sore throat. Negative for ear pain and rhinorrhea.   Eyes: Negative.   Respiratory: Positive for cough. Negative for shortness of breath and wheezing.   Cardiovascular: Negative.  Negative for palpitations.  Gastrointestinal: Negative.  Negative for vomiting.  Endocrine: Negative.   Genitourinary: Negative.   Musculoskeletal: Negative.   Neurological: Positive for headaches.  Hematological: Negative.   Psychiatric/Behavioral: Negative.   All other systems reviewed and are negative.      Objective:   Physical Exam  Constitutional: She is oriented to person, place, and time. She appears well-developed and well-nourished. No distress.  HENT:  Head: Normocephalic and atraumatic.  Right Ear: External ear normal.  Left Ear:  External ear normal.  Nasal passage erythemas with mild swelling  Oropharynx mildly erythemas   Eyes: Pupils are equal, round, and reactive to light.  Neck: Normal range of motion. Neck supple. No thyromegaly present.  Cardiovascular: Normal rate, regular rhythm, normal heart sounds and intact distal pulses.   No murmur heard. Pulmonary/Chest: Effort normal and breath sounds normal. No respiratory distress. She has no wheezes.  Dry cough   Abdominal: Soft. Bowel sounds are normal. She exhibits no distension. There is no tenderness.  Musculoskeletal: Normal range of motion. She exhibits no edema or tenderness.  Neurological: She is alert and oriented to person, place, and time. She has normal reflexes. No cranial nerve deficit.  Skin: Skin is warm and dry.  Psychiatric: She has a normal mood and affect. Her behavior is normal. Judgment and thought content normal.  Vitals reviewed.     BP 113/70 mmHg  Pulse 64  Temp(Src) 97.4 F (36.3 C) (Oral)  Ht 5' 3.25" (1.607 m)  Wt 198 lb 6.4 oz (89.994 kg)  BMI 34.85 kg/m2     Assessment & Plan:  1. Acute upper respiratory infection -Low carb diet and cover blood sugars accordingly -Continue with Flonase, mucinex, Claritin, and tessalon pears - Take meds as prescribed - Use a cool mist humidifier  -Use saline nose sprays frequently -Saline irrigations of the nose can be very helpful if done frequently.  * 4X daily for 1 week*  * Use of a nettie pot can be helpful with this. Follow directions with this* -Force fluids -For any cough or congestion  Use plain Mucinex- regular strength or max strength is fine   * Children- consult with Pharmacist for  dosing -For fever or aces or pains- take tylenol or ibuprofen appropriate for age and weight.  * for fevers greater than 101 orally you may alternate ibuprofen and tylenol every  3 hours. -Throat lozenges if help - methylPREDNISolone (MEDROL DOSEPAK) 4 MG TBPK tablet; Use as directed   Dispense: 21 tablet; Refill: 0  Evelina Dun, FNP

## 2015-07-08 NOTE — Patient Instructions (Signed)
Upper Respiratory Infection, Adult An upper respiratory infection (URI) is also sometimes known as the common cold. The upper respiratory tract includes the nose, sinuses, throat, trachea, and bronchi. Bronchi are the airways leading to the lungs. Most people improve within 1 week, but symptoms can last up to 2 weeks. A residual cough may last even longer.  CAUSES Many different viruses can infect the tissues lining the upper respiratory tract. The tissues become irritated and inflamed and often become very moist. Mucus production is also common. A cold is contagious. You can easily spread the virus to others by oral contact. This includes kissing, sharing a glass, coughing, or sneezing. Touching your mouth or nose and then touching a surface, which is then touched by another person, can also spread the virus. SYMPTOMS  Symptoms typically develop 1 to 3 days after you come in contact with a cold virus. Symptoms vary from person to person. They may include:  Runny nose.  Sneezing.  Nasal congestion.  Sinus irritation.  Sore throat.  Loss of voice (laryngitis).  Cough.  Fatigue.  Muscle aches.  Loss of appetite.  Headache.  Low-grade fever. DIAGNOSIS  You might diagnose your own cold based on familiar symptoms, since most people get a cold 2 to 3 times a year. Your caregiver can confirm this based on your exam. Most importantly, your caregiver can check that your symptoms are not due to another disease such as strep throat, sinusitis, pneumonia, asthma, or epiglottitis. Blood tests, throat tests, and X-rays are not necessary to diagnose a common cold, but they may sometimes be helpful in excluding other more serious diseases. Your caregiver will decide if any further tests are required. RISKS AND COMPLICATIONS  You may be at risk for a more severe case of the common cold if you smoke cigarettes, have chronic heart disease (such as heart failure) or lung disease (such as asthma), or if  you have a weakened immune system. The very young and very old are also at risk for more serious infections. Bacterial sinusitis, middle ear infections, and bacterial pneumonia can complicate the common cold. The common cold can worsen asthma and chronic obstructive pulmonary disease (COPD). Sometimes, these complications can require emergency medical care and may be life-threatening. PREVENTION  The best way to protect against getting a cold is to practice good hygiene. Avoid oral or hand contact with people with cold symptoms. Wash your hands often if contact occurs. There is no clear evidence that vitamin C, vitamin E, echinacea, or exercise reduces the chance of developing a cold. However, it is always recommended to get plenty of rest and practice good nutrition. TREATMENT  Treatment is directed at relieving symptoms. There is no cure. Antibiotics are not effective, because the infection is caused by a virus, not by bacteria. Treatment may include:  Increased fluid intake. Sports drinks offer valuable electrolytes, sugars, and fluids.  Breathing heated mist or steam (vaporizer or shower).  Eating chicken soup or other clear broths, and maintaining good nutrition.  Getting plenty of rest.  Using gargles or lozenges for comfort.  Controlling fevers with ibuprofen or acetaminophen as directed by your caregiver.  Increasing usage of your inhaler if you have asthma. Zinc gel and zinc lozenges, taken in the first 24 hours of the common cold, can shorten the duration and lessen the severity of symptoms. Pain medicines may help with fever, muscle aches, and throat pain. A variety of non-prescription medicines are available to treat congestion and runny nose. Your caregiver   can make recommendations and may suggest nasal or lung inhalers for other symptoms.  HOME CARE INSTRUCTIONS   Only take over-the-counter or prescription medicines for pain, discomfort, or fever as directed by your  caregiver.  Use a warm mist humidifier or inhale steam from a shower to increase air moisture. This may keep secretions moist and make it easier to breathe.  Drink enough water and fluids to keep your urine clear or pale yellow.  Rest as needed.  Return to work when your temperature has returned to normal or as your caregiver advises. You may need to stay home longer to avoid infecting others. You can also use a face mask and careful hand washing to prevent spread of the virus. SEEK MEDICAL CARE IF:   After the first few days, you feel you are getting worse rather than better.  You need your caregiver's advice about medicines to control symptoms.  You develop chills, worsening shortness of breath, or brown or red sputum. These may be signs of pneumonia.  You develop yellow or brown nasal discharge or pain in the face, especially when you bend forward. These may be signs of sinusitis.  You develop a fever, swollen neck glands, pain with swallowing, or white areas in the back of your throat. These may be signs of strep throat. SEEK IMMEDIATE MEDICAL CARE IF:   You have a fever.  You develop severe or persistent headache, ear pain, sinus pain, or chest pain.  You develop wheezing, a prolonged cough, cough up blood, or have a change in your usual mucus (if you have chronic lung disease).  You develop sore muscles or a stiff neck. Document Released: 04/06/2001 Document Revised: 01/03/2012 Document Reviewed: 01/16/2014 [employer] Patient Information 2015 [employer]. This information is not intended to replace advice given to you by your health care provider. Make sure you discuss any questions you have with your health care provider.  - Take meds as prescribed - Use a cool mist humidifier  -Use saline nose sprays frequently -Saline irrigations of the nose can be very helpful if done frequently.  * 4X daily for 1 week*  * Use of a nettie pot can be helpful with this. Follow  directions with this* -Force fluids -For any cough or congestion  Use plain Mucinex- regular strength or max strength is fine   * Children- consult with Pharmacist for dosing -For fever or aces or pains- take tylenol or ibuprofen appropriate for age and weight.  * for fevers greater than 101 orally you may alternate ibuprofen and tylenol every  3 hours. -Throat lozenges if help   Donica Derouin, FNP   

## 2015-07-15 ENCOUNTER — Telehealth: Payer: Self-pay | Admitting: Pediatrics

## 2015-07-16 ENCOUNTER — Encounter: Payer: Self-pay | Admitting: Pediatrics

## 2015-07-16 ENCOUNTER — Ambulatory Visit (INDEPENDENT_AMBULATORY_CARE_PROVIDER_SITE_OTHER): Payer: Medicare Other | Admitting: Pediatrics

## 2015-07-16 ENCOUNTER — Encounter (INDEPENDENT_AMBULATORY_CARE_PROVIDER_SITE_OTHER): Payer: Self-pay

## 2015-07-16 VITALS — BP 117/54 | HR 73 | Temp 98.7°F | Ht 63.25 in | Wt 196.4 lb

## 2015-07-16 DIAGNOSIS — K589 Irritable bowel syndrome without diarrhea: Secondary | ICD-10-CM | POA: Diagnosis not present

## 2015-07-16 DIAGNOSIS — F411 Generalized anxiety disorder: Secondary | ICD-10-CM

## 2015-07-16 DIAGNOSIS — I10 Essential (primary) hypertension: Secondary | ICD-10-CM

## 2015-07-16 DIAGNOSIS — E119 Type 2 diabetes mellitus without complications: Secondary | ICD-10-CM | POA: Diagnosis not present

## 2015-07-16 DIAGNOSIS — R059 Cough, unspecified: Secondary | ICD-10-CM

## 2015-07-16 DIAGNOSIS — R350 Frequency of micturition: Secondary | ICD-10-CM | POA: Diagnosis not present

## 2015-07-16 DIAGNOSIS — N3946 Mixed incontinence: Secondary | ICD-10-CM

## 2015-07-16 DIAGNOSIS — R05 Cough: Secondary | ICD-10-CM

## 2015-07-16 DIAGNOSIS — Z794 Long term (current) use of insulin: Secondary | ICD-10-CM

## 2015-07-16 LAB — POCT URINALYSIS DIPSTICK
BILIRUBIN UA: NEGATIVE
Blood, UA: NEGATIVE
KETONES UA: NEGATIVE
LEUKOCYTES UA: NEGATIVE
Nitrite, UA: NEGATIVE
Protein, UA: NEGATIVE
SPEC GRAV UA: 1.01
Urobilinogen, UA: NEGATIVE
pH, UA: 6

## 2015-07-16 MED ORDER — DULOXETINE HCL 20 MG PO CPEP: ORAL_CAPSULE | ORAL | Status: DC

## 2015-07-16 NOTE — Assessment & Plan Note (Addendum)
Trying to set up an appointment with urology. Continue mirabegron and darifenacin for now. Will check UA today with increase in symptoms. Continues to take keflex daily per urology as ppx.

## 2015-07-16 NOTE — Assessment & Plan Note (Signed)
BP on lower end of her usual BPs when she comes in. On multiple agents at small doses. She has been feeling lightheaded at times with recent URI illness has not been drinking as much as she usually does. Also on multiple anticholinergic agents to help with incontinence. Will stop triamterene/HCTZ for now. Continue losartan and metoprolol. She is going to check BPs at home. Will let me know if regularly elevated.

## 2015-07-16 NOTE — Assessment & Plan Note (Signed)
Will wean off of cymbalta. Can increase lexapro if needed after cymbalta is discontinued. Prescribed 20mg  capsules to help with cymbalta wean, schedule given to pt. Continue lexapro.

## 2015-07-16 NOTE — Assessment & Plan Note (Addendum)
Well controlled. Takes librax occasionally for spasms. Uses lomotil most days for loose stools.

## 2015-07-16 NOTE — Assessment & Plan Note (Signed)
Continue current insulin plan, now off of steroids. RTC in 2 months for diabetes recheck.

## 2015-07-16 NOTE — Progress Notes (Signed)
Subjective:    Patient ID: [name], female    DOB: [birth year], 74 y.o.   MRN: [id number]  HPI: [name] is a 74 y.o. female presenting on 07/16/2015 for Follow-up  Came down with URI recently, was on prednisone and azithromycin. Still has some cough. Still feels very weak. Mouth and throat very dry. Feels lightheaded at times, no falls. Appetite has been normal.   Blood sugars have been high, up to 300s recently because has been on prednisone.   Urinary incontinence: Urgency, leaking a lot, getting up 3-4 times in the night. Has been ongoing problem. She wonders if it is a bit worse than usual and if she has a UTI. Continues to take keflex daily as ppx per her urologist. She might have a botox procedure done soon to help with incontinence.  Cymbalta was started for fibromyalgia pain. She doesn't think it helps. WAs then started on lexapro for anxiety. Anxeity has been better since starting lexapro.   Relevant past medical, surgical, family and social history reviewed and updated as indicated. Interim medical history since our last visit reviewed. Allergies and medications reviewed and updated.   ROS: All systems negative other than what is in the HPI.  Past Medical History Patient Active Problem List   Diagnosis Date Noted  . Obesity (BMI 30-39.9) 07/04/2014  . Aneurysm of iliac artery 01/07/2014  . GERD (gastroesophageal reflux disease) 03/23/2013  . Type 2 diabetes mellitus with insulin therapy 03/23/2013  . Insomnia 03/23/2013  . Other malaise and fatigue 03/23/2013  . MITRAL VALVE PROLAPSE 07/17/2009  . Lichen planus 41/28/7867  . FIBROMYALGIA 07/17/2009  . VAGINITIS 04/04/2009  . Urinary incontinence 03/14/2009  . CHRONIC ULCER OF OTHER SPECIFIED SITE 12/24/2008  . Irritable bowel syndrome 08/29/2008  . BACK PAIN, LUMBAR 07/17/2008  . ACUTE CYSTITIS 01/24/2008  . UNSPECIFIED OSTEOPOROSIS 01/10/2008  . Dyslipidemia associated with type 2 diabetes mellitus  05/22/2007  . Generalized anxiety disorder 05/22/2007  . Essential hypertension 05/22/2007    Current Outpatient Prescriptions  Medication Sig Dispense Refill  . aspirin 81 MG tablet Take 81 mg by mouth daily.      Marland Kitchen atorvastatin (LIPITOR) 20 MG tablet Take 0.5 tablets (10 mg total) by mouth at bedtime. 45 tablet 3  . Calcium Carb-Cholecalciferol (CALCIUM 600+D3) 600-200 MG-UNIT TABS Take 1 tablet by mouth 2 (two) times daily.      . cephALEXin (KEFLEX) 250 MG capsule Take 250 mg by mouth daily.    Marland Kitchen darifenacin (ENABLEX) 15 MG 24 hr tablet Take 15 mg by mouth daily.    Marland Kitchen desonide (DESOWEN) 0.05 % ointment Apply 1 application topically 2 (two) times daily.     . diphenoxylate-atropine (LOMOTIL) 2.5-0.025 MG per tablet Take 1 tablet by mouth 4 (four) times daily as needed for diarrhea or loose stools. 120 tablet 1  . escitalopram (LEXAPRO) 20 MG tablet Take 0.5 tablets (10 mg total) by mouth daily. 90 tablet 1  . esomeprazole (NEXIUM) 20 MG capsule Take 1 capsule (20 mg total) by mouth daily at 12 noon. 90 capsule 1  . estradiol (ESTRACE) 0.1 MG/GM vaginal cream Place 2 g vaginally every Monday, Wednesday, and Friday.     Marland Kitchen glucose blood (FREESTYLE LITE) test strip Use qid to check blood sugar. Dx E11.9 200 each 3  . insulin aspart (NOVOLOG FLEXPEN) 100 UNIT/ML FlexPen Inject prior to each meal per sliding scale.BG less than 80 - no insulin; 80 to 120 - 4 units; 121  to 150 - 6 units; 151 to 200 - 7 units; 201 to 250 - 8 units; 250 or above - 9 units and call office. 30 mL 3  . Insulin Glargine (LANTUS SOLOSTAR) 100 UNIT/ML Solostar Pen Inject 44 Units into the skin every morning. 45 mL 3  . Insulin Pen Needle (NOVOFINE) 30G X 8 MM MISC Use to inject insulin up to qid. 400 each 3  . losartan (COZAAR) 50 MG tablet Take 1 tablet (50 mg total) by mouth daily. 90 tablet 3  . metoprolol succinate (TOPROL-XL) 25 MG 24 hr tablet Take 0.5 tablets (12.5 mg total) by mouth daily. 45 tablet 3  . mirabegron  ER (MYRBETRIQ) 50 MG TB24 tablet Take 50 mg by mouth daily.    . Multiple Vitamin (MULTIVITAMIN) tablet Take 0.5 tablets by mouth 2 (two) times daily.     Marland Kitchen nystatin-triamcinolone ointment (MYCOLOG) Apply 1 application topically 2 (two) times daily. 30 g 3  . Polyethyl Glycol-Propyl Glycol 0.4-0.3 % SOLN Apply to eye.    . Probiotic Product (PROBIOTIC ADVANCED PO) Take by mouth.    . traZODone (DESYREL) 50 MG tablet Take 1 tablet (50 mg total) by mouth at bedtime as needed for sleep. 90 tablet 2  . clidinium-chlordiazePOXIDE (LIBRAX) 5-2.5 MG per capsule Take 1 capsule by mouth 2 (two) times daily as needed.   1  . DULoxetine (CYMBALTA) 20 MG capsule Take 2 tabs daily for 1 week. Then 1 tab daily for 1 week. 21 capsule 0  . nystatin (MYCOSTATIN/NYSTOP) 100000 UNIT/GM POWD Apply under bilateral breasts and groin BID (Patient not taking: Reported on 07/16/2015) 60 g 3   No current facility-administered medications for this visit.       Objective:    BP 117/54 mmHg  Pulse 73  Temp(Src) 98.7 F (37.1 C) (Oral)  Ht 5' 3.25" (1.607 m)  Wt 196 lb 6.4 oz (89.086 kg)  BMI 34.50 kg/m2  SpO2 98%  Wt Readings from Last 3 Encounters:  07/16/15 196 lb 6.4 oz (89.086 kg)  07/08/15 198 lb 6.4 oz (89.994 kg)  07/01/15 197 lb (89.359 kg)     Gen: NAD, alert, cooperative with exam, NCAT EYES: EOMI, no scleral injection or icterus ENT:  TMs pearly gray b/l, OP with mild erythema LYMPH: no cervical LAD CV: NRRR, normal S1/S2, no murmur, DP pulses 2+ b/l Resp: CTABL, no wheezes, normal WOB Abd: +BS, soft, mildly tender with deep palpation R side. ND. No epigastric pain.  Ext: No edema, warm Neuro: Alert and oriented, strength equal b/l UE and LE, coordination grossly normal MSK: normal muscle bulk     Assessment & Plan:   74yoF here for f/u of multiple medical problems.  Urinary incontinence Trying to set up an appointment with urology. Continue mirabegron and darifenacin for now. UA negative  in clinic for infection. Continues to take keflex daily per urology as ppx.  Essential hypertension BP on lower end of her usual BPs when she comes in. On multiple agents at small doses. She has been feeling lightheaded at times with recent URI illness has not been drinking as much as she usually does. Also on multiple anticholinergic agents to help with incontinence. Will stop triamterene/HCTZ for now. Continue losartan and metoprolol. She is going to check BPs at home. Will let me know if regularly elevated.  Irritable bowel syndrome Well controlled. Takes librax occasionally for spasms. Uses lomotil most days for loose stools.   Type 2 diabetes mellitus with insulin therapy  Continue current insulin plan, now off of steroids. RTC in 2 months for diabetes recheck.  Generalized anxiety disorder Will wean off of cymbalta. Can increase lexapro if needed after cymbalta is discontinued. Prescribed 20mg  capsules to help with cymbalta wean, schedule given to pt. Continue lexapro.   Cough Likely post-viral cough. Lung exam normal, feeling better overall. May continue for another couple of weeks.   Follow up plan: Return in about 2 months (around 09/15/2015) for diabetes check.  Assunta Found, MD [facility] 07/16/2015, 1:55 PM

## 2015-07-16 NOTE — Patient Instructions (Addendum)
Let me know if your blood pressure is regularly over 150 on top or over 90 on the bottom. Check 3 times a week.   Drink plenty of fluids.  Take 2 tabs (40mg ) of cymbalta for 1 week.  Then take 1 tab (20mg ) of cymbalta for 1 week.  Let me know if anxiety or depression symptoms get worse as we are changing the medicines you are on.

## 2015-07-22 DIAGNOSIS — B351 Tinea unguium: Secondary | ICD-10-CM | POA: Diagnosis not present

## 2015-07-22 DIAGNOSIS — L84 Corns and callosities: Secondary | ICD-10-CM | POA: Diagnosis not present

## 2015-07-22 DIAGNOSIS — E1142 Type 2 diabetes mellitus with diabetic polyneuropathy: Secondary | ICD-10-CM | POA: Diagnosis not present

## 2015-07-24 NOTE — Telephone Encounter (Signed)
Rx for Diflucan sent to the pharmacy 05-14-15

## 2015-07-28 ENCOUNTER — Telehealth: Payer: Self-pay | Admitting: Pediatrics

## 2015-07-28 DIAGNOSIS — Z794 Long term (current) use of insulin: Principal | ICD-10-CM

## 2015-07-28 DIAGNOSIS — I10 Essential (primary) hypertension: Secondary | ICD-10-CM

## 2015-07-28 DIAGNOSIS — E785 Hyperlipidemia, unspecified: Secondary | ICD-10-CM

## 2015-07-28 DIAGNOSIS — K219 Gastro-esophageal reflux disease without esophagitis: Secondary | ICD-10-CM

## 2015-07-28 DIAGNOSIS — E1143 Type 2 diabetes mellitus with diabetic autonomic (poly)neuropathy: Secondary | ICD-10-CM

## 2015-07-28 MED ORDER — INSULIN ASPART 100 UNIT/ML FLEXPEN: PEN_INJECTOR | SUBCUTANEOUS | Status: DC

## 2015-07-28 MED ORDER — METOPROLOL SUCCINATE ER 25 MG PO TB24: 12.5000 mg | ORAL_TABLET | Freq: Every day | ORAL | Status: DC

## 2015-07-28 MED ORDER — INSULIN GLARGINE 100 UNIT/ML SOLOSTAR PEN: 44.0000 [IU] | PEN_INJECTOR | SUBCUTANEOUS | Status: DC

## 2015-07-28 MED ORDER — ATORVASTATIN CALCIUM 20 MG PO TABS: 10.0000 mg | ORAL_TABLET | Freq: Every day | ORAL | Status: DC

## 2015-07-28 MED ORDER — INSULIN PEN NEEDLE 30G X 8 MM MISC: Status: DC

## 2015-07-28 MED ORDER — ESOMEPRAZOLE MAGNESIUM 20 MG PO CPDR: 20.0000 mg | DELAYED_RELEASE_CAPSULE | Freq: Every day | ORAL | Status: DC

## 2015-07-28 NOTE — Telephone Encounter (Signed)
Refilled pt meds, has f/u in 1 month with me.

## 2015-07-29 DIAGNOSIS — R351 Nocturia: Secondary | ICD-10-CM | POA: Diagnosis not present

## 2015-07-29 DIAGNOSIS — N3941 Urge incontinence: Secondary | ICD-10-CM | POA: Diagnosis not present

## 2015-07-29 DIAGNOSIS — R350 Frequency of micturition: Secondary | ICD-10-CM | POA: Diagnosis not present

## 2015-07-30 ENCOUNTER — Encounter: Payer: Self-pay | Admitting: Pharmacist

## 2015-07-30 ENCOUNTER — Ambulatory Visit (INDEPENDENT_AMBULATORY_CARE_PROVIDER_SITE_OTHER): Payer: Medicare Other | Admitting: Pharmacist

## 2015-07-30 VITALS — BP 142/78 | HR 62 | Ht 63.0 in | Wt 197.8 lb

## 2015-07-30 DIAGNOSIS — E1122 Type 2 diabetes mellitus with diabetic chronic kidney disease: Secondary | ICD-10-CM

## 2015-07-30 DIAGNOSIS — Z794 Long term (current) use of insulin: Secondary | ICD-10-CM

## 2015-07-30 DIAGNOSIS — N183 Chronic kidney disease, stage 3 unspecified: Secondary | ICD-10-CM

## 2015-07-30 DIAGNOSIS — E785 Hyperlipidemia, unspecified: Secondary | ICD-10-CM

## 2015-07-30 DIAGNOSIS — K219 Gastro-esophageal reflux disease without esophagitis: Secondary | ICD-10-CM

## 2015-07-30 MED ORDER — INSULIN ASPART 100 UNIT/ML FLEXPEN: PEN_INJECTOR | SUBCUTANEOUS | Status: DC

## 2015-07-30 MED ORDER — INSULIN GLARGINE 100 UNIT/ML SOLOSTAR PEN: 46.0000 [IU] | PEN_INJECTOR | SUBCUTANEOUS | Status: DC

## 2015-07-30 NOTE — Patient Instructions (Addendum)
Novolog Insulin Inject prior to each meal per sliding scale: BG less than 80 - no insulin;   80 to 120 - 3 units;   121 to 150 - 5 units;   151 to 200 - 7 units;   201 to 250 - 8 units;   250 or above - 9 units and call office.  Increase Lantus to 46 units once daily   Diabetes and Standards of Medical Care   Diabetes is complicated. You may find that your diabetes team includes a dietitian, nurse, diabetes educator, eye doctor, and more. To help everyone know what is going on and to help you get the care you deserve, the following schedule of care was developed to help keep you on track. Below are the tests, exams, vaccines, medicines, education, and plans you will need.  Blood Glucose Goals Prior to meals = 80 - 130 Within 2 hours of the start of a meal = less than 180  HbA1c test (goal is less than 7.0% - your last value was 7.2%) This test shows how well you have controlled your glucose over the past 2 to 3 months. It is used to see if your diabetes management plan needs to be adjusted.   It is performed at least 2 times a year if you are meeting treatment goals.  It is performed 4 times a year if therapy has changed or if you are not meeting treatment goals.  Blood pressure test  This test is performed at every routine medical visit. The goal is less than 140/90 mmHg for most people, but 130/80 mmHg in some cases. Ask your health care provider about your goal.  Dental exam  Follow up with the dentist regularly.  Eye exam  If you are diagnosed with type 1 diabetes as a child, get an exam upon reaching the age of 77 years or older and have had diabetes for 3 to 5 years. Yearly eye exams are recommended after that initial eye exam.  If you are diagnosed with type 1 diabetes as an adult, get an exam within 5 years of diagnosis and then yearly.  If you are diagnosed with type 2 diabetes, get an exam as soon as possible after the diagnosis and then yearly.  Foot care  exam  Visual foot exams are performed at every routine medical visit. The exams check for cuts, injuries, or other problems with the feet.  A comprehensive foot exam should be done yearly. This includes visual inspection as well as assessing foot pulses and testing for loss of sensation.  Check your feet nightly for cuts, injuries, or other problems with your feet. Tell your health care provider if anything is not healing.  Kidney function test (urine microalbumin)  This test is performed once a year.  Type 1 diabetes: The first test is performed 5 years after diagnosis.  Type 2 diabetes: The first test is performed at the time of diagnosis.  A serum creatinine and estimated glomerular filtration rate (eGFR) test is done once a year to assess the level of chronic kidney disease (CKD), if present.  Lipid profile (cholesterol, HDL, LDL, triglycerides)  Performed every 5 years for most people.  The goal for LDL is less than 100 mg/dL. If you are at high risk, the goal is less than 70 mg/dL.  The goal for HDL is 40 mg/dL to 50 mg/dL for men and 50 mg/dL to 60 mg/dL for women. An HDL cholesterol of 60 mg/dL or higher gives some  protection against heart disease.  The goal for triglycerides is less than 150 mg/dL.  Influenza vaccine, pneumococcal vaccine, and hepatitis B vaccine  The influenza vaccine is recommended yearly.  The pneumococcal vaccine is generally given once in a lifetime. However, there are some instances when another vaccination is recommended. Check with your health care provider.  The hepatitis B vaccine is also recommended for adults with diabetes.  Diabetes self-management education  Education is recommended at diagnosis and ongoing as needed.  Treatment plan  Your treatment plan is reviewed at every medical visit.  Document Released: 08/08/2009 Document Revised: 06/13/2013 Document Reviewed: 03/13/2013 [facility] Patient Information 2014 [place].

## 2015-07-30 NOTE — Progress Notes (Signed)
Patient ID: [name], female   DOB: July 02, 1941, 74 y.o.   MRN: [id number] Diabetes Follow-Up Visit Chief Complaint:   Chief Complaint  Patient presents with  . Diabetes     Filed Vitals:   07/30/15 1144  BP: 142/78  Pulse: 62   Filed Weights   07/30/15 1144  Weight: 197 lb 12 oz (89.699 kg)    HPI:  Diabetes: patient has long standing diabetes.  Her most recent A1C was 7.2%.  Over the last 4-8 weeks she has had several rounds of ABX for infection and took methylprednisolone for 6 days in September.  She experienced elevated BG readings in the high 180's and mid 200's.  BG is improving though.   Current medications for diabetes include -  Lantus 44 units daily and Novolog sliding scale (BG less than 80 - no insulin, 80 to 120 - 5 units, 121 to 150 - 6 units, 151 to 200 - 7 units, 201 to 250 - 8 units, 250 or above - 9 units and call office) Patient has tried metformin in past but stopped because she had low energy.  Once she stopped metformin her energy level improved but she is unsure if it really was metformin or not. She also was taking Jardiance but we stopped due to decrease in GFR to around 30. Current GFR = 44  Polyuria:  negative  Polydipsia:  negative Polyphagia:  negatvie  BMI:  Body mass index is 35.04 kg/(m^2).   Weight has increased over the last 12 months.   General Appearance:  alert, oriented, no acute distress and obese Mood/Affect:  normal  Diet - Continues to try to limit calorie or CHO intake but admits to more times of dietary indiscretions that in past when she lost about 20# Exercise:  Stationary bike 1-2 times per week  Home BG Monitoring:  Checking 3 to 5 times a day. Average fro first 5 days in October = 152 Range 92 to 196 Patient has had about 6 lows in the last 2 months - occurs mostly prior to lunch or supper after good BG in the am.  Highs mostly occurring after she has a low BG readings.     Lab Results  Component Value Date   HGBA1C 7.2  05/15/2015      Lab Results  Component Value Date   CHOL 132 11/27/2014   HDL 66 11/27/2014   LDLCALC 43 11/27/2014   TRIG 115 11/27/2014   CHOLHDL 2.0 11/27/2014     Assessment: 1.  Diabetes.  A1c if good but BG continues to be variable with more over last month due to steroid use 2.  Blood Pressure.  A little above goal today but was low at last visit and Maxzide was discontinued.  3.  Lipids.  Controlled 4.  Obesity - weight has increased by about 15# over the last 6 months  Recommendations: 1.  Medication recommendations at this time are as follows:    Increase Lantus to 46 units daily   Continue Novolog sliding scale   BG less than 80 - no insulin    80 to 120 - 3 units    121 to 150 - 5 units    151 to 200 - 7 units    201 to 250 - 8 units    250 or above - 9 units and call office  Discussed GLP1 agents and insulin pump (patient asked about pump - she is possibly interested).  Patient will RTO in  1 weeks to have C-Peptide checked to help guide future therapy. 2.  Reviewed proper treatment of hypoglycemia to keep from over correcting.  3.  Reviewed HBG goals:  Fasting 80-130 and 1-2 hour post prandial <180.  Patient is instructed to check BG 3 to 5 times per day.    4.  BP goal < 140/80. Recheck in 1 week when she comes in for influenza vaccine.  If still elevated then will consider increase dose of current BP meds. 5.  LDL goal of < 100, HDL > 40 and TG < 150. 6.  Eye Exam yearly and Dental Exam every 6 months. 7.  RTC in 1 week for influenza vaccine and recheck NP  Cherre Robins, PharmD, CPP, CDE

## 2015-08-04 ENCOUNTER — Ambulatory Visit (INDEPENDENT_AMBULATORY_CARE_PROVIDER_SITE_OTHER): Payer: Medicare Other

## 2015-08-04 VITALS — BP 125/60 | HR 71

## 2015-08-04 DIAGNOSIS — Z23 Encounter for immunization: Secondary | ICD-10-CM | POA: Diagnosis not present

## 2015-08-04 DIAGNOSIS — Z794 Long term (current) use of insulin: Secondary | ICD-10-CM

## 2015-08-04 DIAGNOSIS — N183 Chronic kidney disease, stage 3 (moderate): Secondary | ICD-10-CM | POA: Diagnosis not present

## 2015-08-04 DIAGNOSIS — E1122 Type 2 diabetes mellitus with diabetic chronic kidney disease: Secondary | ICD-10-CM

## 2015-08-04 NOTE — Addendum Note (Signed)
Addended by: Rolena Infante on: 08/04/2015 09:40 AM   Modules accepted: Level of Service

## 2015-08-04 NOTE — Progress Notes (Signed)
Patient ID: [name], female   DOB: 1941-07-30, 74 y.o.   MRN: [id number]   Patient came to office today to get her flu shot and also a BP recheck per Tammy. BP today is 125/60 with a pulse of 71. Advised I would send to provider.

## 2015-08-04 NOTE — Addendum Note (Signed)
Addended by: Earlene Plater on: 08/04/2015 09:30 AM   Modules accepted: Orders

## 2015-08-05 LAB — BMP8+EGFR
BUN / CREAT RATIO: 14 (ref 11–26)
BUN: 16 mg/dL (ref 8–27)
CO2: 28 mmol/L (ref 18–29)
CREATININE: 1.13 mg/dL — AB (ref 0.57–1.00)
Calcium: 9 mg/dL (ref 8.7–10.3)
Chloride: 93 mmol/L — ABNORMAL LOW (ref 97–108)
GFR, EST AFRICAN AMERICAN: 55 mL/min/{1.73_m2} — AB (ref >59)
GFR, EST NON AFRICAN AMERICAN: 48 mL/min/{1.73_m2} — AB (ref >59)
Glucose: 288 mg/dL — ABNORMAL HIGH (ref 65–99)
Potassium: 4.5 mmol/L (ref 3.5–5.2)
Sodium: 134 mmol/L (ref 134–144)

## 2015-08-05 LAB — C-PEPTIDE: C PEPTIDE: 1.9 ng/mL (ref 1.1–4.4)

## 2015-08-07 DIAGNOSIS — R350 Frequency of micturition: Secondary | ICD-10-CM | POA: Diagnosis not present

## 2015-08-07 DIAGNOSIS — N3946 Mixed incontinence: Secondary | ICD-10-CM | POA: Diagnosis not present

## 2015-08-11 ENCOUNTER — Ambulatory Visit (INDEPENDENT_AMBULATORY_CARE_PROVIDER_SITE_OTHER): Payer: Medicare Other | Admitting: Ophthalmology

## 2015-08-11 DIAGNOSIS — I10 Essential (primary) hypertension: Secondary | ICD-10-CM | POA: Diagnosis not present

## 2015-08-11 DIAGNOSIS — E113293 Type 2 diabetes mellitus with mild nonproliferative diabetic retinopathy without macular edema, bilateral: Secondary | ICD-10-CM | POA: Diagnosis not present

## 2015-08-11 DIAGNOSIS — H43813 Vitreous degeneration, bilateral: Secondary | ICD-10-CM

## 2015-08-11 DIAGNOSIS — H35033 Hypertensive retinopathy, bilateral: Secondary | ICD-10-CM

## 2015-08-11 DIAGNOSIS — E11319 Type 2 diabetes mellitus with unspecified diabetic retinopathy without macular edema: Secondary | ICD-10-CM

## 2015-08-14 ENCOUNTER — Encounter: Payer: Self-pay | Admitting: Pharmacist

## 2015-08-14 ENCOUNTER — Ambulatory Visit (INDEPENDENT_AMBULATORY_CARE_PROVIDER_SITE_OTHER): Payer: Medicare Other | Admitting: Pharmacist

## 2015-08-14 VITALS — BP 138/68 | HR 70

## 2015-08-14 DIAGNOSIS — E119 Type 2 diabetes mellitus without complications: Secondary | ICD-10-CM | POA: Diagnosis not present

## 2015-08-14 DIAGNOSIS — H40033 Anatomical narrow angle, bilateral: Secondary | ICD-10-CM | POA: Diagnosis not present

## 2015-08-14 DIAGNOSIS — N183 Chronic kidney disease, stage 3 (moderate): Secondary | ICD-10-CM | POA: Diagnosis not present

## 2015-08-14 DIAGNOSIS — E1122 Type 2 diabetes mellitus with diabetic chronic kidney disease: Secondary | ICD-10-CM

## 2015-08-14 DIAGNOSIS — I10 Essential (primary) hypertension: Secondary | ICD-10-CM

## 2015-08-14 DIAGNOSIS — Z794 Long term (current) use of insulin: Principal | ICD-10-CM

## 2015-08-14 LAB — HM DIABETES EYE EXAM

## 2015-08-14 MED ORDER — DULAGLUTIDE 0.75 MG/0.5ML ~~LOC~~ SOAJ: 0.7500 mg | SUBCUTANEOUS | Status: DC

## 2015-08-14 NOTE — Progress Notes (Signed)
Patient ID: [name], female   DOB: 11-02-1940, 74 y.o.   MRN: [id number]  Diabetes Follow-Up Visit Chief Complaint:   Chief Complaint  Patient presents with  . Diabetes     Filed Vitals:   08/14/15 1658  BP: 138/68  Pulse: 70   HPI:  Diabetes: patient has long standing diabetes.  Her most recent A1C was 7.2%.  Over the last 4-8 weeks she has had several rounds of ABX for infection and took methylprednisolone for 6 days in September.   I saw her last week and checked  CPeptide which was WNL She is here today to get instruction for GLP1 administration  Current medications for diabetes include -  Lantus 46 units daily and Novolog sliding scale (BG less than 80 - no insulin, 80 to 120 - 5 units, 121 to 150 - 6 units, 151 to 200 - 7 units, 201 to 250 - 8 units, 250 or above - 9 units and call office)  Patient has tried metformin in past but stopped because she had low energy.  Once she stopped metformin her energy level improved but she is unsure if it really was metformin or not. She also was taking Jardiance but we stopped due to decrease in GFR to around 30. Current GFR = 48  Diet - Continues to try to limit calorie or CHO intake but admits to more times of dietary indiscretions that in past when she lost about 20# Exercise:  Stationary bike 1-2 times per week   Lab Results  Component Value Date   HGBA1C 7.2 05/15/2015      Lab Results  Component Value Date   CHOL 132 11/27/2014   HDL 66 11/27/2014   LDLCALC 43 11/27/2014   TRIG 115 11/27/2014   CHOLHDL 2.0 11/27/2014     Assessment: 1.  Diabetes.  A1c if good but BG continues to be variable with more over last month due to steroid use 2.  Blood Pressure.  At goal today 3.  Obesity - weight has increased by about 15# over the last 6 months  Recommendations: 1.  Medication recommendations at this time are as follows:    Change back to Lantus 44 units daily   Start Trulicity 0.75mg  SQ q week - gave #2 samples and  first adminstration in office today  Continue Novolog sliding scale   BG less than 80 - no insulin    80 to 120 - 3 units    121 to 150 - 5 units    151 to 200 - 7 units    201 to 250 - 8 units    250 or above - 9 units and call office 2.  Reviewed proper treatment of hypoglycemia to keep from over correcting.  3.  Reviewed HBG goals:  Fasting 80-130 and 1-2 hour post prandial <180.  Patient is instructed to check BG 3 to 5 times per day.    4.  BP goal < 140/80. Continue current BP meds and cheking Bp at home once dailyto every other day   Cherre Robins, PharmD, CPP, CDE

## 2015-08-20 DIAGNOSIS — N3946 Mixed incontinence: Secondary | ICD-10-CM | POA: Diagnosis not present

## 2015-08-20 DIAGNOSIS — R350 Frequency of micturition: Secondary | ICD-10-CM | POA: Diagnosis not present

## 2015-08-21 ENCOUNTER — Telehealth: Payer: Self-pay | Admitting: Pediatrics

## 2015-08-21 DIAGNOSIS — E1122 Type 2 diabetes mellitus with diabetic chronic kidney disease: Secondary | ICD-10-CM

## 2015-08-21 DIAGNOSIS — N183 Chronic kidney disease, stage 3 (moderate): Principal | ICD-10-CM

## 2015-08-21 DIAGNOSIS — Z794 Long term (current) use of insulin: Principal | ICD-10-CM

## 2015-08-21 NOTE — Telephone Encounter (Signed)
Since starting trulicity - prior to supper has had 81, 72, 75, 88. Morning readings - 107, 168, 130, 115, 100, 128, 134  Taking lantus 44 units qam; Novolog per sliding scale.   Decrease Lantus to 42 units daily.  Continue Novolog per sliding scale.

## 2015-09-08 ENCOUNTER — Telehealth: Payer: Self-pay | Admitting: Pediatrics

## 2015-09-08 MED ORDER — DULAGLUTIDE 0.75 MG/0.5ML ~~LOC~~ SOAJ: 0.7500 mg | SUBCUTANEOUS | Status: DC

## 2015-09-08 NOTE — Telephone Encounter (Signed)
Patient calls to get another sample of Trulicity 0.75mg .  She states that BG has greatly improved - ranges from 80 to 134.  She has noticed some nausea and possible constipation related to Trulicity but she is not sure.  Discussed that she can have nausea with it, especially if she continues to eat after having a feeling of fullness.  Patient is to continue Trulicity but if nausea worsens or is intolerable she is to call me.  Has follow up with PCP next week.

## 2015-09-11 DIAGNOSIS — M722 Plantar fascial fibromatosis: Secondary | ICD-10-CM | POA: Diagnosis not present

## 2015-09-11 DIAGNOSIS — M79671 Pain in right foot: Secondary | ICD-10-CM | POA: Diagnosis not present

## 2015-09-16 ENCOUNTER — Ambulatory Visit (INDEPENDENT_AMBULATORY_CARE_PROVIDER_SITE_OTHER): Payer: Medicare Other | Admitting: Pediatrics

## 2015-09-16 ENCOUNTER — Encounter: Payer: Self-pay | Admitting: Pediatrics

## 2015-09-16 VITALS — BP 107/60 | HR 69 | Temp 97.3°F | Ht 63.0 in | Wt 195.2 lb

## 2015-09-16 DIAGNOSIS — E1169 Type 2 diabetes mellitus with other specified complication: Secondary | ICD-10-CM

## 2015-09-16 DIAGNOSIS — K5900 Constipation, unspecified: Secondary | ICD-10-CM | POA: Diagnosis not present

## 2015-09-16 DIAGNOSIS — E11319 Type 2 diabetes mellitus with unspecified diabetic retinopathy without macular edema: Secondary | ICD-10-CM

## 2015-09-16 DIAGNOSIS — I10 Essential (primary) hypertension: Secondary | ICD-10-CM

## 2015-09-16 DIAGNOSIS — E785 Hyperlipidemia, unspecified: Secondary | ICD-10-CM | POA: Diagnosis not present

## 2015-09-16 DIAGNOSIS — Z794 Long term (current) use of insulin: Secondary | ICD-10-CM

## 2015-09-16 DIAGNOSIS — E119 Type 2 diabetes mellitus without complications: Secondary | ICD-10-CM | POA: Diagnosis not present

## 2015-09-16 LAB — POCT GLYCOSYLATED HEMOGLOBIN (HGB A1C): Hemoglobin A1C: 6.4

## 2015-09-16 MED ORDER — LOSARTAN POTASSIUM 50 MG PO TABS: 50.0000 mg | ORAL_TABLET | Freq: Every day | ORAL | Status: DC

## 2015-09-16 NOTE — Patient Instructions (Addendum)
Metamucil fiber supplement  Probiotic daily  Miralax as needed for stools   Walk 30 minutes every day

## 2015-09-16 NOTE — Progress Notes (Signed)
Subjective:    Patient ID: [name], female    DOB: 06-Nov-1940, 74 y.o.   MRN: [id number]  CC: f/u multiple med problems  HPI: [name] is a 74 y.o. female presenting on 09/16/2015 for Follow-up  BPs 120s-130s/70s at home Off of triamterene recently No lightheadedness or falls  DM2: BGLs much better controlle don the trulicity Having some nauesa nad headache Taking miralax for the constipation  HTN: no headaches, no vision changes, no lightheadness or pre-syncopal symptoms  No fevers, no URI symptoms, no CP or SOB, otherwise feeling well.   Relevant past medical, surgical, family and social history reviewed and updated as indicated. Interim medical history since our last visit reviewed. Allergies and medications reviewed and updated.   ROS: Per HPI unless specifically indicated above  Past Medical History Patient Active Problem List   Diagnosis Date Noted  . Diabetic retinopathy ([place]) 09/16/2015  . Obesity (BMI 30-39.9) 07/04/2014  . Aneurysm of iliac artery ([place]) 01/07/2014  . GERD (gastroesophageal reflux disease) 03/23/2013  . Type 2 diabetes mellitus with insulin therapy ([place]) 03/23/2013  . Insomnia 03/23/2013  . Other malaise and fatigue 03/23/2013  . MITRAL VALVE PROLAPSE 07/17/2009  . Lichen planus 99991111  . FIBROMYALGIA 07/17/2009  . VAGINITIS 04/04/2009  . Urinary incontinence 03/14/2009  . CHRONIC ULCER OF OTHER SPECIFIED SITE 12/24/2008  . Irritable bowel syndrome 08/29/2008  . BACK PAIN, LUMBAR 07/17/2008  . ACUTE CYSTITIS 01/24/2008  . UNSPECIFIED OSTEOPOROSIS 01/10/2008  . Dyslipidemia associated with type 2 diabetes mellitus ([place]) 05/22/2007  . Generalized anxiety disorder 05/22/2007  . Essential hypertension 05/22/2007    Current Outpatient Prescriptions  Medication Sig Dispense Refill  . aspirin 81 MG tablet Take 81 mg by mouth daily.      Marland Kitchen atorvastatin (LIPITOR) 20 MG tablet Take 0.5 tablets (10 mg total) by mouth at bedtime.  45 tablet 3  . Calcium Carb-Cholecalciferol (CALCIUM 600+D3) 600-200 MG-UNIT TABS Take 1 tablet by mouth 2 (two) times daily.      . cephALEXin (KEFLEX) 250 MG capsule Take 250 mg by mouth daily.    . clidinium-chlordiazePOXIDE (LIBRAX) 5-2.5 MG per capsule Take 1 capsule by mouth 2 (two) times daily as needed.   1  . desonide (DESOWEN) 0.05 % ointment Apply 1 application topically 2 (two) times daily.     . diphenoxylate-atropine (LOMOTIL) 2.5-0.025 MG per tablet Take 1 tablet by mouth 4 (four) times daily as needed for diarrhea or loose stools. 120 tablet 1  . Dulaglutide (TRULICITY) A999333 0000000 SOPN Inject 0.75 mg into the skin once a week. 1 mL 0  . escitalopram (LEXAPRO) 20 MG tablet Take 0.5 tablets (10 mg total) by mouth daily. 90 tablet 1  . esomeprazole (NEXIUM) 20 MG capsule Take 1 capsule (20 mg total) by mouth daily at 12 noon. 90 capsule 3  . estradiol (ESTRACE) 0.1 MG/GM vaginal cream Place 2 g vaginally every Monday, Wednesday, and Friday.     Marland Kitchen glucose blood (FREESTYLE LITE) test strip Use qid to check blood sugar. Dx E11.9 200 each 3  . insulin aspart (NOVOLOG FLEXPEN) 100 UNIT/ML FlexPen Inject prior to each meal per sliding scale.BG less than 80 - no insulin; 80 to 120 - 3 units; 121 to 150 - 5 units; 151 to 200 - 7 units; 201 to 250 - 8 units; 250 or above - 9 units and call office. 30 mL 3  . Insulin Glargine (LANTUS SOLOSTAR) 100 UNIT/ML Solostar Pen Inject 44  Units into the skin every morning. Please note increase in dose 45 mL 0  . Insulin Pen Needle (NOVOFINE) 30G X 8 MM MISC Use to inject insulin up to qid. 400 each 3  . losartan (COZAAR) 50 MG tablet Take 1 tablet (50 mg total) by mouth daily. 90 tablet 3  . metoprolol succinate (TOPROL-XL) 25 MG 24 hr tablet Take 0.5 tablets (12.5 mg total) by mouth daily. 45 tablet 3  . mirabegron ER (MYRBETRIQ) 50 MG TB24 tablet Take 50 mg by mouth daily.    . Multiple Vitamin (MULTIVITAMIN) tablet Take 0.5 tablets by mouth 2 (two)  times daily.     Marland Kitchen nystatin (MYCOSTATIN/NYSTOP) 100000 UNIT/GM POWD Apply under bilateral breasts and groin BID 60 g 3  . nystatin-triamcinolone ointment (MYCOLOG) Apply 1 application topically 2 (two) times daily. 30 g 3  . Polyethyl Glycol-Propyl Glycol 0.4-0.3 % SOLN Apply to eye.    . Probiotic Product (PROBIOTIC ADVANCED PO) Take by mouth.    . traZODone (DESYREL) 50 MG tablet Take 1 tablet (50 mg total) by mouth at bedtime as needed for sleep. 90 tablet 2   No current facility-administered medications for this visit.       Objective:    BP 107/60 mmHg  Pulse 69  Temp(Src) 97.3 F (36.3 C) (Oral)  Ht 5\' 3"  (1.6 m)  Wt 195 lb 3.2 oz (88.542 kg)  BMI 34.59 kg/m2  Wt Readings from Last 3 Encounters:  09/16/15 195 lb 3.2 oz (88.542 kg)  07/30/15 197 lb 12 oz (89.699 kg)  07/16/15 196 lb 6.4 oz (89.086 kg)     Gen: NAD, alert, cooperative with exam, NCAT EYES: EOMI, no scleral injection or icterus ENT:  TMs pearly gray b/l, OP without erythema LYMPH: no cervical LAD CV: NRRR, normal S1/S2, no murmur, distal pulses 2+ b/l Resp: CTABL, no wheezes, normal WOB Abd: +BS, soft, NTND. no guarding or organomegaly Ext: No edema, warm Neuro: Alert and oriented, strength equal b/l UE and LE, coordination grossly normal MSK: normal muscle bulk     Assessment & Plan:    Kinnidi was seen today for follow-up of mulitple med problems.  Diagnoses and all orders for this visit:  Dyslipidemia associated with type 2 diabetes mellitus ([place]) continue atorvastatin  Type 2 diabetes mellitus with insulin therapy ([place]) Recheck HgA1c today. Has been started on trulicity over past few weeks. Doing well other than the constipation. -     POCT glycosylated hemoglobin (Hb A1C) -     Microalbumin/Creatinine Ratio, Urine  Diabetic retinopathy without macular edema associated with type 2 diabetes mellitus, unspecified retinopathy severity ([place]) Follows with eye doctor regularly.  Essential  hypertension Labs normal last month. Low today, decrease to losartan 50mg  daily. -     losartan (COZAAR) 50 MG tablet; Take 1 tablet (50 mg total) by mouth daily.  Constipation, unspecified constipation type Discussed increasing fiber, miralax and stool softener for bowel regimen.    Follow up plan:  already has f/u with Tammy for DM scheduled  Return in about 4 months (around 01/14/2016). to see me  Assunta Found, MD [facility] 09/16/2015, 10:19 AM

## 2015-09-17 LAB — MICROALBUMIN / CREATININE URINE RATIO
CREATININE, UR: 72.2 mg/dL
MICROALB/CREAT RATIO: <4.2 mg/g creat (ref 0.0–30.0)

## 2015-10-01 ENCOUNTER — Telehealth: Payer: Self-pay | Admitting: Pediatrics

## 2015-10-01 ENCOUNTER — Encounter: Payer: Self-pay | Admitting: Cardiovascular Disease

## 2015-10-02 ENCOUNTER — Other Ambulatory Visit: Payer: Self-pay | Admitting: Pediatrics

## 2015-10-02 MED ORDER — DULAGLUTIDE 0.75 MG/0.5ML ~~LOC~~ SOAJ: 0.7500 mg | SUBCUTANEOUS | Status: DC

## 2015-10-02 NOTE — Telephone Encounter (Signed)
Pt aware samples in fridge 

## 2015-10-07 DIAGNOSIS — B351 Tinea unguium: Secondary | ICD-10-CM | POA: Diagnosis not present

## 2015-10-07 DIAGNOSIS — L84 Corns and callosities: Secondary | ICD-10-CM | POA: Diagnosis not present

## 2015-10-07 DIAGNOSIS — E1142 Type 2 diabetes mellitus with diabetic polyneuropathy: Secondary | ICD-10-CM | POA: Diagnosis not present

## 2015-10-08 NOTE — Telephone Encounter (Signed)
Aware, no trulicity samples available.

## 2015-10-13 ENCOUNTER — Ambulatory Visit: Payer: Self-pay | Admitting: Pharmacist

## 2015-10-15 ENCOUNTER — Encounter: Payer: Self-pay | Admitting: Pediatrics

## 2015-10-15 ENCOUNTER — Ambulatory Visit: Payer: Medicare Other | Admitting: Pharmacist

## 2015-10-15 ENCOUNTER — Ambulatory Visit (INDEPENDENT_AMBULATORY_CARE_PROVIDER_SITE_OTHER): Payer: Medicare Other | Admitting: Pediatrics

## 2015-10-15 VITALS — BP 109/60 | HR 79 | Temp 98.1°F | Ht 63.0 in | Wt 197.0 lb

## 2015-10-15 DIAGNOSIS — J019 Acute sinusitis, unspecified: Secondary | ICD-10-CM

## 2015-10-15 MED ORDER — AZITHROMYCIN 250 MG PO TABS: ORAL_TABLET | ORAL | Status: DC

## 2015-10-15 NOTE — Patient Instructions (Signed)
Use neti pot with distilled water 2-3 times a day to rinse sinuses Also can use ocean spray nasal saline or similar OK to use flonase 2 sprays once a day in each nare Start antibiotic if you start to get fevers or start to feel worse.

## 2015-10-15 NOTE — Progress Notes (Signed)
Subjective:    Patient ID: [name], female    DOB: 1941-07-08, 74 y.o.   MRN: [id number]  CC: Cough; Congestion; and Sore Throat   HPI: [name] is a 74 y.o. female presenting for Cough; Congestion; and Sore Throat  Started 4-5 days ago Sneezing, coughing, aching, sore throat, chills, runny nose. Chilled last night. Temperatures at home have been normal Appetite has been ok. Has been takin gtylenol and mucinex, claritin, cough medicine Cough keeps her awake at night    Depression screen [facility] 2/9 10/15/2015 09/16/2015 09/16/2015 07/16/2015 06/24/2015  Decreased Interest 0 1 0 1 1  Down, Depressed, Hopeless 0 1 0 1 1  PHQ - 2 Score 0 2 0 2 2  Altered sleeping - 1 - 1 1  Tired, decreased energy - 1 - 1 1  Change in appetite - 1 - 1 1  Feeling bad or failure about yourself  - 1 - 1 0  Trouble concentrating - 1 - 1 0  Moving slowly or fidgety/restless - 0 - 1 0  Suicidal thoughts - 0 - 1 0  PHQ-9 Score - 7 - 9 5     Relevant past medical, surgical, family and social history reviewed and updated as indicated. Interim medical history since our last visit reviewed. Allergies and medications reviewed and updated.    ROS: Per HPI unless specifically indicated above  History  Smoking status  . Never Smoker   Smokeless tobacco  . Never Used    Past Medical History Patient Active Problem List   Diagnosis Date Noted  . Diabetic retinopathy ([place]) 09/16/2015  . Obesity (BMI 30-39.9) 07/04/2014  . Aneurysm of iliac artery ([place]) 01/07/2014  . GERD (gastroesophageal reflux disease) 03/23/2013  . Type 2 diabetes mellitus with insulin therapy ([place]) 03/23/2013  . Insomnia 03/23/2013  . Other malaise and fatigue 03/23/2013  . MITRAL VALVE PROLAPSE 07/17/2009  . Lichen planus 99991111  . FIBROMYALGIA 07/17/2009  . VAGINITIS 04/04/2009  . Urinary incontinence 03/14/2009  . CHRONIC ULCER OF OTHER SPECIFIED SITE 12/24/2008  . Irritable bowel syndrome 08/29/2008  .  BACK PAIN, LUMBAR 07/17/2008  . ACUTE CYSTITIS 01/24/2008  . UNSPECIFIED OSTEOPOROSIS 01/10/2008  . Dyslipidemia associated with type 2 diabetes mellitus ([place]) 05/22/2007  . Generalized anxiety disorder 05/22/2007  . Essential hypertension 05/22/2007    Current Outpatient Prescriptions  Medication Sig Dispense Refill  . aspirin 81 MG tablet Take 81 mg by mouth daily.      Marland Kitchen atorvastatin (LIPITOR) 20 MG tablet Take 0.5 tablets (10 mg total) by mouth at bedtime. 45 tablet 3  . Calcium Carb-Cholecalciferol (CALCIUM 600+D3) 600-200 MG-UNIT TABS Take 1 tablet by mouth 2 (two) times daily.      . clidinium-chlordiazePOXIDE (LIBRAX) 5-2.5 MG per capsule Take 1 capsule by mouth 2 (two) times daily as needed.   1  . desonide (DESOWEN) 0.05 % ointment Apply 1 application topically 2 (two) times daily.     . diphenoxylate-atropine (LOMOTIL) 2.5-0.025 MG per tablet Take 1 tablet by mouth 4 (four) times daily as needed for diarrhea or loose stools. 120 tablet 1  . Dulaglutide (TRULICITY) A999333 0000000 SOPN Inject 0.75 mg into the skin once a week. 1 pen 0  . escitalopram (LEXAPRO) 20 MG tablet Take 0.5 tablets (10 mg total) by mouth daily. 90 tablet 1  . esomeprazole (NEXIUM) 20 MG capsule Take 1 capsule (20 mg total) by mouth daily at 12 noon. 90 capsule 3  . estradiol (  ESTRACE) 0.1 MG/GM vaginal cream Place 2 g vaginally every Monday, Wednesday, and Friday.     Marland Kitchen glucose blood (FREESTYLE LITE) test strip Use qid to check blood sugar. Dx E11.9 200 each 3  . insulin aspart (NOVOLOG FLEXPEN) 100 UNIT/ML FlexPen Inject prior to each meal per sliding scale.BG less than 80 - no insulin; 80 to 120 - 3 units; 121 to 150 - 5 units; 151 to 200 - 7 units; 201 to 250 - 8 units; 250 or above - 9 units and call office. 30 mL 3  . Insulin Glargine (LANTUS SOLOSTAR) 100 UNIT/ML Solostar Pen Inject 44 Units into the skin every morning. Please note increase in dose 45 mL 0  . Insulin Pen Needle (NOVOFINE) 30G X 8 MM MISC  Use to inject insulin up to qid. 400 each 3  . losartan (COZAAR) 50 MG tablet Take 1 tablet (50 mg total) by mouth daily. 90 tablet 3  . metoprolol succinate (TOPROL-XL) 25 MG 24 hr tablet Take 0.5 tablets (12.5 mg total) by mouth daily. 45 tablet 3  . mirabegron ER (MYRBETRIQ) 50 MG TB24 tablet Take 50 mg by mouth daily.    . Multiple Vitamin (MULTIVITAMIN) tablet Take 0.5 tablets by mouth 2 (two) times daily.     Marland Kitchen nystatin (MYCOSTATIN/NYSTOP) 100000 UNIT/GM POWD Apply under bilateral breasts and groin BID 60 g 3  . nystatin-triamcinolone ointment (MYCOLOG) Apply 1 application topically 2 (two) times daily. 30 g 3  . Polyethyl Glycol-Propyl Glycol 0.4-0.3 % SOLN Apply to eye.    . Probiotic Product (PROBIOTIC ADVANCED PO) Take by mouth.    . traZODone (DESYREL) 50 MG tablet Take 1 tablet (50 mg total) by mouth at bedtime as needed for sleep. 90 tablet 2  . azithromycin (ZITHROMAX) 250 MG tablet Take 2 the first day and then one each day after. 6 tablet 0   No current facility-administered medications for this visit.       Objective:    BP 109/60 mmHg  Pulse 79  Temp(Src) 98.1 F (36.7 C) (Oral)  Ht 5\' 3"  (1.6 m)  Wt 197 lb (89.359 kg)  BMI 34.91 kg/m2  Wt Readings from Last 3 Encounters:  10/15/15 197 lb (89.359 kg)  09/16/15 195 lb 3.2 oz (88.542 kg)  07/30/15 197 lb 12 oz (89.699 kg)    Gen: NAD, alert, cooperative with exam, NCAT, congested EYES: EOMI, no scleral injection or icterus ENT:  TMs pearly gray b/l, OP with mild erythema LYMPH: no cervical LAD CV: NRRR, normal S1/S2, no murmur, distal pulses 2+ b/l Resp: CTABL, no wheezes, normal WOB Abd: +BS, soft, NTND. no guarding or organomegaly Ext: No edema, warm Neuro: Alert and oriented     Assessment & Plan:    [name] was seen today for cough, congestion and sore throat, likely due to acute URI. Discussed symptomatic care. If not improving will start abx as below.  Diagnoses and all orders for this visit:  Acute  sinusitis, recurrence not specified, unspecified location -     azithromycin (ZITHROMAX) 250 MG tablet; Take 2 the first day and then one each day after.  Patient Instructions Use neti pot with distilled water 2-3 times a day to rinse sinuses Also can use ocean spray nasal saline or similar OK to use flonase 2 sprays once a day in each nare Start antibiotic if you start to get fevers or start to feel worse.    Follow up plan: Return if symptoms worsen or fail to  improve.  Assunta Found, MD [facility] 10/15/2015, 12:33 PM

## 2015-10-28 ENCOUNTER — Encounter: Payer: Self-pay | Admitting: Pharmacist

## 2015-10-28 ENCOUNTER — Ambulatory Visit (INDEPENDENT_AMBULATORY_CARE_PROVIDER_SITE_OTHER): Payer: Medicare Other | Admitting: Pharmacist

## 2015-10-28 VITALS — BP 126/60 | HR 71 | Ht 63.0 in | Wt 196.0 lb

## 2015-10-28 DIAGNOSIS — F32A Depression, unspecified: Secondary | ICD-10-CM

## 2015-10-28 DIAGNOSIS — Z794 Long term (current) use of insulin: Secondary | ICD-10-CM | POA: Diagnosis not present

## 2015-10-28 DIAGNOSIS — E669 Obesity, unspecified: Secondary | ICD-10-CM | POA: Diagnosis not present

## 2015-10-28 DIAGNOSIS — E119 Type 2 diabetes mellitus without complications: Secondary | ICD-10-CM

## 2015-10-28 DIAGNOSIS — F329 Major depressive disorder, single episode, unspecified: Secondary | ICD-10-CM

## 2015-10-28 DIAGNOSIS — I10 Essential (primary) hypertension: Secondary | ICD-10-CM | POA: Diagnosis not present

## 2015-10-28 MED ORDER — ESCITALOPRAM OXALATE 20 MG PO TABS: 20.0000 mg | ORAL_TABLET | Freq: Every day | ORAL | Status: DC

## 2015-10-28 NOTE — Patient Instructions (Addendum)
Check blood pressure daily for next 7 days.  If you get a reading of less than 100 on the top or less than 60 on the bottom call our office.  [phone]  Increase lexapro 20mg  to 1 tablet daily  Check on alternatives for Trulicity that might be covered by insurance Trulicity Tanzem Bydureon or Byetta Victoza

## 2015-10-28 NOTE — Progress Notes (Signed)
Patient ID: [name], female   DOB: 1941-09-22, 75 y.o.   MRN: [id number]  Diabetes Follow-Up Visit Chief Complaint:   No chief complaint on file.    There were no vitals filed for this visit. HPI:  Diabetes: patient has long standing diabetes.  Her most recent A1C was 6.4%.  Trulicity was started [id number].  She tolerated well but then experienced HA and constipation in November 2016 which she thought was related to Trulicity.  She held for 1 week but did not see much of a difference so she restarted Trulicity.    Current medications for diabetes include -  Trulicity 0.75mg  q week, Lantus 44 units daily and Novolog sliding scale (BG less than 80 - no insulin, 80 to 120 - 3 units, 121 to 150 - 5 units, 151 to 200 - 7 units, 201 to 250 - 8 units, 250 or above - 9 units and call office)  Patient has tried metformin in past but stopped because she had low energy.  Once she stopped metformin her energy level improved but she is unsure if it really was metformin or not. She also was taking Jardiance but we stopped due to decrease in GFR to around 30. Current GFR = 48  Diet - Continues to try to limit calorie or CHO intake.  She states that Trulicity is helping with appetite.  She has lost about 2# over the last 3 months Exercise:  Stationary bike 1-2 times per week  Depression / Anxiety:  Patient also asks about increasing lexapro to 20mg .  She is currently taking 1/2 tablet and was also taking duloxetine.  She was weaned off duloxetine in October but she states that she has been having difficulty sleeping and increased depressive thoughts.  She denies any thoughts of hurting self or others.  Dr Evette Doffing had instructed patient that she could increase dose to 20mg  if needed but patient wanted to make sure this was ok   Lab Results  Component Value Date   HGBA1C 6.4 09/16/2015      Lab Results  Component Value Date   CHOL 132 11/27/2014   HDL 66 11/27/2014   LDLCALC 43 11/27/2014   TRIG  115 11/27/2014   CHOLHDL 2.0 11/27/2014    Filed Vitals:   10/28/15 1108  BP: 126/60  Pulse: 71   Filed Weights   10/28/15 1108  Weight: 196 lb (88.905 kg)   \ Body mass index is 34.73 kg/(m^2).  Assessment: 1.  Diabetes.  controlled 2.  Blood Pressure.  At goal today 3.  Obesity - weight has decreased slightly over the last 3 months. 4.  Depression / Anxiety  Recommendations: 1.  Medication recommendations at this time are as follows:    Increase escitalopram / Lexapro to 20mg  daily  Continue Lantus 44 units daily   Continue Trulicity 0.75mg  SQ q week   Continue Novolog sliding scale   BG less than 80 - no insulin    80 to 120 - 3 units    121 to 150 - 5 units    151 to 200 - 7 units    201 to 250 - 8 units    250 or above - 9 units and call office  2.  Reviewed HBG goals:  Fasting 80-130 and 1-2 hour post prandial <180.  Patient is instructed to check BG 3 to 5 times per day.  3.  Continue with diet - limiting high CHO containing foods.  Also continue with daily  exercise.  4.  BP goal < 140/80. Continue current BP meds and cheking Bp at home once daily for next 7 days - call office if less than 100/60.    Cherre Robins, PharmD, CPP, CDE

## 2015-10-29 ENCOUNTER — Telehealth: Payer: Self-pay | Admitting: Pharmacist

## 2015-10-29 MED ORDER — DULAGLUTIDE 0.75 MG/0.5ML ~~LOC~~ SOAJ: 0.7500 mg | SUBCUTANEOUS | Status: DC

## 2015-10-29 NOTE — Telephone Encounter (Signed)
#  2 pen samples of Trulicity 0.75mg  given to patient and also Rx printed for her to sent to mail order pharmacy.  Patient notified.

## 2015-11-13 ENCOUNTER — Telehealth: Payer: Self-pay | Admitting: Pediatrics

## 2015-11-13 NOTE — Telephone Encounter (Signed)
Patient aware we are out of samples.  

## 2015-11-18 DIAGNOSIS — L900 Lichen sclerosus et atrophicus: Secondary | ICD-10-CM | POA: Diagnosis not present

## 2015-11-18 DIAGNOSIS — Z6834 Body mass index (BMI) 34.0-34.9, adult: Secondary | ICD-10-CM | POA: Diagnosis not present

## 2015-11-18 DIAGNOSIS — Z01419 Encounter for gynecological examination (general) (routine) without abnormal findings: Secondary | ICD-10-CM | POA: Diagnosis not present

## 2015-11-18 DIAGNOSIS — Z1231 Encounter for screening mammogram for malignant neoplasm of breast: Secondary | ICD-10-CM | POA: Diagnosis not present

## 2015-11-18 DIAGNOSIS — Z1389 Encounter for screening for other disorder: Secondary | ICD-10-CM | POA: Diagnosis not present

## 2015-11-27 ENCOUNTER — Other Ambulatory Visit: Payer: Self-pay | Admitting: Obstetrics and Gynecology

## 2015-11-27 DIAGNOSIS — R928 Other abnormal and inconclusive findings on diagnostic imaging of breast: Secondary | ICD-10-CM

## 2015-12-03 ENCOUNTER — Ambulatory Visit
Admission: RE | Admit: 2015-12-03 | Discharge: 2015-12-03 | Disposition: A | Discharge status: Home / Self Care | Payer: Medicare Other | Source: Ambulatory Visit | Attending: Obstetrics and Gynecology | Admitting: Obstetrics and Gynecology

## 2015-12-03 ENCOUNTER — Other Ambulatory Visit: Payer: Self-pay | Admitting: Obstetrics and Gynecology

## 2015-12-03 DIAGNOSIS — R928 Other abnormal and inconclusive findings on diagnostic imaging of breast: Secondary | ICD-10-CM

## 2015-12-03 DIAGNOSIS — N63 Unspecified lump in breast: Secondary | ICD-10-CM | POA: Diagnosis not present

## 2015-12-03 DIAGNOSIS — R922 Inconclusive mammogram: Secondary | ICD-10-CM | POA: Diagnosis not present

## 2015-12-04 ENCOUNTER — Other Ambulatory Visit: Payer: Medicare Other

## 2015-12-10 ENCOUNTER — Ambulatory Visit
Admission: RE | Admit: 2015-12-10 | Discharge: 2015-12-10 | Disposition: A | Discharge status: Home / Self Care | Payer: Medicare Other | Source: Ambulatory Visit | Attending: Obstetrics and Gynecology | Admitting: Obstetrics and Gynecology

## 2015-12-10 ENCOUNTER — Other Ambulatory Visit: Payer: Self-pay | Admitting: Obstetrics and Gynecology

## 2015-12-10 DIAGNOSIS — R928 Other abnormal and inconclusive findings on diagnostic imaging of breast: Secondary | ICD-10-CM

## 2015-12-10 DIAGNOSIS — N6489 Other specified disorders of breast: Secondary | ICD-10-CM | POA: Diagnosis not present

## 2015-12-10 DIAGNOSIS — N6012 Diffuse cystic mastopathy of left breast: Secondary | ICD-10-CM | POA: Diagnosis not present

## 2015-12-16 DIAGNOSIS — E1142 Type 2 diabetes mellitus with diabetic polyneuropathy: Secondary | ICD-10-CM | POA: Diagnosis not present

## 2015-12-16 DIAGNOSIS — L84 Corns and callosities: Secondary | ICD-10-CM | POA: Diagnosis not present

## 2015-12-16 DIAGNOSIS — B351 Tinea unguium: Secondary | ICD-10-CM | POA: Diagnosis not present

## 2015-12-18 ENCOUNTER — Ambulatory Visit (INDEPENDENT_AMBULATORY_CARE_PROVIDER_SITE_OTHER): Payer: Medicare Other | Admitting: Pediatrics

## 2015-12-18 ENCOUNTER — Encounter: Payer: Self-pay | Admitting: Pediatrics

## 2015-12-18 VITALS — BP 117/68 | HR 77 | Temp 98.0°F | Ht 63.0 in | Wt 193.2 lb

## 2015-12-18 DIAGNOSIS — I10 Essential (primary) hypertension: Secondary | ICD-10-CM

## 2015-12-18 DIAGNOSIS — E119 Type 2 diabetes mellitus without complications: Secondary | ICD-10-CM

## 2015-12-18 DIAGNOSIS — R5383 Other fatigue: Secondary | ICD-10-CM

## 2015-12-18 DIAGNOSIS — F329 Major depressive disorder, single episode, unspecified: Secondary | ICD-10-CM | POA: Diagnosis not present

## 2015-12-18 DIAGNOSIS — Z794 Long term (current) use of insulin: Secondary | ICD-10-CM | POA: Diagnosis not present

## 2015-12-18 DIAGNOSIS — F32A Depression, unspecified: Secondary | ICD-10-CM

## 2015-12-18 LAB — POCT GLYCOSYLATED HEMOGLOBIN (HGB A1C): HEMOGLOBIN A1C: 6.5

## 2015-12-18 MED ORDER — ESCITALOPRAM OXALATE 20 MG PO TABS: 20.0000 mg | ORAL_TABLET | Freq: Every day | ORAL | Status: DC

## 2015-12-18 NOTE — Progress Notes (Signed)
Subjective:    Patient ID: [name], female    DOB: 1941-10-08, 75 y.o.   MRN: [id number]  CC: Follow-up multiple med problems  HPI: [name] is a 75 y.o. female presenting for Follow-up  Has had a biopsy of the abnormal area found on mammogram, told she should have it removed though is not cancer.  Depression: lexapro has been helping some  DM2: taking insulin regularly, AM bgls have been appropriate  HTN: taking meds daily, no vision changes, HA, SOB or chest pain  She has bene having some fatigue  Depression screen [facility] 2/9 12/18/2015 10/15/2015 09/16/2015 09/16/2015 07/16/2015  Decreased Interest 0 0 1 0 1  Down, Depressed, Hopeless 0 0 1 0 1  PHQ - 2 Score 0 0 2 0 2  Altered sleeping - - 1 - 1  Tired, decreased energy - - 1 - 1  Change in appetite - - 1 - 1  Feeling bad or failure about yourself  - - 1 - 1  Trouble concentrating - - 1 - 1  Moving slowly or fidgety/restless - - 0 - 1  Suicidal thoughts - - 0 - 1  PHQ-9 Score - - 7 - 9     Relevant past medical, surgical, family and social history reviewed and updated as indicated. Interim medical history since our last visit reviewed. Allergies and medications reviewed and updated.    ROS: Per HPI unless specifically indicated above  History  Smoking status  . Never Smoker   Smokeless tobacco  . Never Used    Past Medical History Patient Active Problem List   Diagnosis Date Noted  . Diabetic retinopathy ([place]) 09/16/2015  . Obesity (BMI 30-39.9) 07/04/2014  . Aneurysm of iliac artery ([place]) 01/07/2014  . GERD (gastroesophageal reflux disease) 03/23/2013  . Type 2 diabetes mellitus with insulin therapy ([place]) 03/23/2013  . Insomnia 03/23/2013  . Other malaise and fatigue 03/23/2013  . MITRAL VALVE PROLAPSE 07/17/2009  . Lichen planus [id number]  . FIBROMYALGIA 07/17/2009  . VAGINITIS 04/04/2009  . Urinary incontinence 03/14/2009  . CHRONIC ULCER OF OTHER SPECIFIED SITE 12/24/2008  .  Irritable bowel syndrome 08/29/2008  . BACK PAIN, LUMBAR 07/17/2008  . ACUTE CYSTITIS 01/24/2008  . UNSPECIFIED OSTEOPOROSIS 01/10/2008  . Dyslipidemia associated with type 2 diabetes mellitus ([place]) 05/22/2007  . Generalized anxiety disorder 05/22/2007  . Essential hypertension 05/22/2007        Objective:    BP 117/68 mmHg  Pulse 77  Temp(Src) 98 F (36.7 C) (Oral)  Ht 5\' 3"  (1.6 m)  Wt 193 lb 3.2 oz (87.635 kg)  BMI 34.23 kg/m2  Wt Readings from Last 3 Encounters:  12/18/15 193 lb 3.2 oz (87.635 kg)  10/28/15 196 lb (88.905 kg)  10/15/15 197 lb (89.359 kg)    Gen: NAD, alert, cooperative with exam, NCAT EYES: EOMI, no scleral injection or icterus ENT:  TMs pearly gray b/l, OP without erythema LYMPH: no cervical LAD CV: NRRR, normal S1/S2, no murmur, distal pulses 2+ b/l Resp: CTABL, no wheezes, normal WOB Abd: +BS, soft, NTND. no guarding or organomegaly Ext: No edema, warm Neuro: Alert and oriented, strength equal b/l UE and LE, coordination grossly normal MSK: normal muscle bulk     Assessment & Plan:    [name] was seen today for follow-up mulitple med problems  Diagnoses and all orders for this visit:  Depression -     escitalopram (LEXAPRO) 20 MG tablet; Take 1 tablet (20 mg total)  by mouth daily.  Other fatigue Multiple normal vitamin D levels in past few years. -     CBC  Type 2 diabetes mellitus with insulin therapy ([facility]) -     POCT glycosylated hemoglobin (Hb A1C)  HTN well controlled, cont current meds  Follow up plan: Return in about 3 months (around 03/16/2016).  Assunta Found, MD [facility] 12/18/2015, 12:07 PM

## 2015-12-19 LAB — CBC
HEMATOCRIT: 40.1 % (ref 34.0–46.6)
Hemoglobin: 13.3 g/dL (ref 11.1–15.9)
MCH: 31.2 pg (ref 26.6–33.0)
MCHC: 33.2 g/dL (ref 31.5–35.7)
MCV: 94 fL (ref 79–97)
Platelets: 176 10*3/uL (ref 150–379)
RBC: 4.26 x10E6/uL (ref 3.77–5.28)
RDW: 13.7 % (ref 12.3–15.4)
WBC: 5.4 10*3/uL (ref 3.4–10.8)

## 2015-12-24 ENCOUNTER — Other Ambulatory Visit: Payer: Self-pay | Admitting: General Surgery

## 2015-12-24 DIAGNOSIS — N6022 Fibroadenosis of left breast: Secondary | ICD-10-CM

## 2016-01-01 ENCOUNTER — Other Ambulatory Visit: Payer: Self-pay | Admitting: General Surgery

## 2016-01-01 DIAGNOSIS — R928 Other abnormal and inconclusive findings on diagnostic imaging of breast: Secondary | ICD-10-CM

## 2016-01-15 ENCOUNTER — Encounter (HOSPITAL_BASED_OUTPATIENT_CLINIC_OR_DEPARTMENT_OTHER): Payer: Self-pay | Admitting: *Deleted

## 2016-01-16 ENCOUNTER — Ambulatory Visit (INDEPENDENT_AMBULATORY_CARE_PROVIDER_SITE_OTHER): Payer: Medicare Other | Admitting: Pharmacist

## 2016-01-16 ENCOUNTER — Encounter: Payer: Self-pay | Admitting: Pharmacist

## 2016-01-16 VITALS — BP 120/78 | HR 74 | Ht 64.25 in | Wt 192.0 lb

## 2016-01-16 DIAGNOSIS — Z0000 Encounter for general adult medical examination without abnormal findings: Secondary | ICD-10-CM | POA: Diagnosis not present

## 2016-01-16 DIAGNOSIS — E785 Hyperlipidemia, unspecified: Secondary | ICD-10-CM

## 2016-01-16 DIAGNOSIS — R5382 Chronic fatigue, unspecified: Secondary | ICD-10-CM

## 2016-01-16 DIAGNOSIS — M8580 Other specified disorders of bone density and structure, unspecified site: Secondary | ICD-10-CM

## 2016-01-16 NOTE — Progress Notes (Signed)
Patient ID: [name], female   DOB: 19-Mar-1941, 75 y.o.   MRN: [id number]   Subjective:   [name] is a 75 y.o. female who presents for an Initial Medicare Annual Wellness Visit.  She had an abnormal mammogram of left breast in February 2017.  She had follow up Ultrasound and biopsy.  Biopsy showed complex sclerosing lesion / radial scar.  She is scheduled for left breast lumpectomy 01/22/2016  HBG readings - range 58 to 170 Checking BG tid.  Review of Systems  Review of Systems  Constitutional: Positive for weight loss.  Neurological: Positive for weakness.  Psychiatric/Behavioral: Positive for depression.   Current Medications (verified) Outpatient Encounter Prescriptions as of 01/16/2016  Medication Sig  . aspirin 81 MG tablet Take 81 mg by mouth daily.    Marland Kitchen atorvastatin (LIPITOR) 20 MG tablet Take 0.5 tablets (10 mg total) by mouth at bedtime.  . Calcium Carb-Cholecalciferol (CALCIUM 600+D3) 600-200 MG-UNIT TABS Take 1 tablet by mouth 2 (two) times daily.    Marland Kitchen desonide (DESOWEN) 0.05 % ointment Apply 1 application topically 2 (two) times daily.   . Dulaglutide (TRULICITY) A999333 0000000 SOPN Inject 0.75 mg into the skin once a week.  . escitalopram (LEXAPRO) 20 MG tablet Take 1 tablet (20 mg total) by mouth daily.  Marland Kitchen esomeprazole (NEXIUM) 20 MG capsule Take 1 capsule (20 mg total) by mouth daily at 12 noon.  Marland Kitchen estradiol (ESTRACE) 0.1 MG/GM vaginal cream Place 2 g vaginally every Monday, Wednesday, and Friday.   Marland Kitchen glucose blood (FREESTYLE LITE) test strip Use qid to check blood sugar. Dx E11.9  . insulin aspart (NOVOLOG FLEXPEN) 100 UNIT/ML FlexPen Inject prior to each meal per sliding scale.BG less than 80 - no insulin; 80 to 120 - 3 units; 121 to 150 - 5 units; 151 to 200 - 7 units; 201 to 250 - 8 units; 250 or above - 9 units and call office.  . Insulin Glargine (LANTUS SOLOSTAR) 100 UNIT/ML Solostar Pen Inject 44 Units into the skin every morning. Please note increase in  dose  . Insulin Pen Needle (NOVOFINE) 30G X 8 MM MISC Use to inject insulin up to qid.  Marland Kitchen losartan (COZAAR) 50 MG tablet Take 1 tablet (50 mg total) by mouth daily.  . metoprolol succinate (TOPROL-XL) 25 MG 24 hr tablet Take 0.5 tablets (12.5 mg total) by mouth daily.  . mirabegron ER (MYRBETRIQ) 50 MG TB24 tablet Take 50 mg by mouth daily.  . Multiple Vitamin (MULTIVITAMIN) tablet Take 0.5 tablets by mouth 2 (two) times daily.   Marland Kitchen nystatin (MYCOSTATIN/NYSTOP) 100000 UNIT/GM POWD Apply under bilateral breasts and groin BID  . nystatin-triamcinolone ointment (MYCOLOG) Apply 1 application topically 2 (two) times daily.  Vladimir Faster Glycol-Propyl Glycol 0.4-0.3 % SOLN Apply to eye.  . Probiotic Product (PROBIOTIC ADVANCED PO) Take by mouth.  . traZODone (DESYREL) 50 MG tablet Take 1 tablet (50 mg total) by mouth at bedtime as needed for sleep.   No facility-administered encounter medications on file as of 01/16/2016.    Allergies (verified) Latex; Ambien; Amoxicillin; Celebrex; Fluticasone; Hydrocodone; Hydrocodone-acetaminophen; Hyoscyamine sulfate; Mobic; Morphine; Oxycodone; Tramadol; Vicoprofen; Zolpidem tartrate; and Sulfa antibiotics   History: Past Medical History  Diagnosis Date  . Fibromyalgia   . IBS (irritable bowel syndrome)   . Diabetic retinopathy   . Osteoporosis   . Acute cystitis   . Depression   . Diabetes mellitus type II   . Hyperlipidemia   . Hypertension   .  GERD (gastroesophageal reflux disease)   . Insomnia   . Urge incontinence of urine   . IBS (irritable bowel syndrome)   . Postmenopausal   . Lichen planus     vulvar  . DM type 2 causing CKD stage 3 ([place])   . Pancreatitis, gallstone    Past Surgical History  Procedure Laterality Date  . Cataract extraction w/ intraocular lens implant  09/2003, 12/2003  . Cholecystectomy  2004  . Tubal ligation  1970  . Ankle surgery  02/2004  . Total knee arthroplasty      Left  . Eye surgery      laser  treatments  . Trigger finger release Left 01/30/2002  . Joint replacement     Family History  Problem Relation Age of Onset  . Diabetes Mother   . Hypertension Mother   . Kidney disease Mother   . Heart disease Mother   . Peripheral vascular disease Mother     amputation  . Cancer Father     prostate  . Diabetes Father   . Heart disease Father     CABG  . Hypertension Father   . Heart attack Father   . Depression Sister   . Hypertension Sister    Social History   Occupational History  . retired    Social History Main Topics  . Smoking status: Never Smoker   . Smokeless tobacco: Never Used  . Alcohol Use: No  . Drug Use: No  . Sexual Activity: No    Do you feel safe at home?  Yes  Dietary issues and exercise activities: Current Exercise Habits: Home exercise routine, Type of exercise: Other - see comments (stationary bike), Time (Minutes): 15, Frequency (Times/Week): 3, Weekly Exercise (Minutes/Week): 45, Intensity: Moderate  Current Dietary habits:  Since starting Truclicity has been eating smaller meals. She follows a low fat, CHO counting diet.    Objective:    Today's Vitals   01/16/16 1416  BP: 120/78  Pulse: 74  Height: 5' 4.25" (1.632 m)  Weight: 192 lb (87.091 kg)  PainSc: 0-No pain   Body mass index is 32.7 kg/(m^2).   Last A1c = 6.5% (12/18/2015)  Activities of Daily Living In your present state of health, do you have any difficulty performing the following activities: 01/16/2016  Hearing? N  Vision? N  Difficulty concentrating or making decisions? N  Walking or climbing stairs? Y  Dressing or bathing? N  Doing errands, shopping? N  Preparing Food and eating ? N  Using the Toilet? N  In the past six months, have you accidently leaked urine? Y  Do you have problems with loss of bowel control? N  Managing your Medications? N  Managing your Finances? N  Housekeeping or managing your Housekeeping? N    Are there smokers in your home (other  than you)? No   Cardiac Risk Factors include: advanced age (>70men, >1 women);diabetes mellitus;dyslipidemia;hypertension;obesity (BMI >30kg/m2)  Depression Screen PHQ 2/9 Scores 01/16/2016 12/18/2015 10/15/2015 09/16/2015  PHQ - 2 Score 2 0 0 2  PHQ- 9 Score 4 - - 7    Fall Risk Fall Risk  01/16/2016 12/18/2015 10/15/2015 09/16/2015 09/16/2015  Falls in the past year? No No No No No  Risk for fall due to : - - - - -  Risk for fall due to (comments): - - - - -    Cognitive Function: MMSE - Mini Mental State Exam 01/16/2016 12/30/2014  Orientation to time 5 5  Orientation  to Place 5 5  Registration 3 3  Attention/ Calculation 5 5  Recall 3 3  Language- name 2 objects 2 2  Language- repeat 1 1  Language- follow 3 step command 3 3  Language- read & follow direction 1 1  Write a sentence 1 1  Copy design 1 1  Total score 30 30    Immunizations and Health Maintenance Immunization History  Administered Date(s) Administered  . Influenza,inj,Quad PF,36+ Mos 07/30/2013, 07/24/2014, 08/04/2015  . Pneumococcal Conjugate-13 11/27/2014  . Pneumococcal Polysaccharide-23 10/22/2009  . Td 10/25/2004  . Tdap 11/27/2014  . Zoster 04/06/2012   There are no preventive care reminders to display for this patient.  Patient Care Team: Eustaquio Maize, MD as PCP - General (Pediatrics) Kendell Bane, MD (Surgery) Magnus Sinning, MD as Consulting Physician (Orthopedic Surgery) Teena Irani, MD as Consulting Physician (Gastroenterology) Paula Compton, MD as Consulting Physician (Obstetrics and Gynecology) Rolm Bookbinder, MD as Consulting Physician (Dermatology) Bjorn Loser, MD as Consulting Physician (Urology) Minus Breeding, MD as Consulting Physician (Cardiology) Steffanie Rainwater, DPM as Consulting Physician (Podiatry) Raeanne Gathers, AUD as Consulting Physician (Audiology) Susa Day, MD as Consulting Physician (Orthopedic Surgery)  Indicate any recent Medical Services you may have received from  other than [facility] providers in the past year (date may be approximate).    Assessment:    Annual Wellness Visit  Type 2 DM - well controlled Fatigue  Obesity - has lost 2# over last 4 weeks.  Screening Tests Health Maintenance  Topic Date Due  . DEXA SCAN  03/19/2016  . OPHTHALMOLOGY EXAM  03/25/2016  . FOOT EXAM  05/12/2016  . INFLUENZA VACCINE  05/25/2016  . HEMOGLOBIN A1C  06/16/2016  . MAMMOGRAM  12/09/2016  . COLONOSCOPY  12/24/2019  . TETANUS/TDAP  11/27/2024  . ZOSTAVAX  Completed  . PNA vac Low Risk Adult  Completed        Plan:   During the course of the visit Khristin was educated and counseled about the following appropriate screening and preventive services:   Vaccines to include Pneumoccal, Influenza, Td, Zostavax - UTD on currently recommended vaccines  Colorectal cancer screening - FOBT given in office today  Cardiovascular disease screening -patient is to get EKG next week prior to procedure scheduled for 01/22/16.    BP is at goal; LDL is at goal and patient is taking statin  Diabetes - continue current meds and checked BG tid  Bone Denisty / Osteoporosis Screening - due to recheck in 1 year  Mammogram - UTD   Glaucoma screening / Diabetic Eye Exam - UTD - need to request last visit notes form either Dr Marin Comment or [place] [facility] - Continue to limit fat and CHO in diet.    Physical Activity - increase stationary bike sessions to 4- 5 times per week.  Advanced Directives - UTD  Orders Placed This Encounter  Procedures  . Lipid panel    Standing Status: Future     Number of Occurrences:      Standing Expiration Date: 01/15/2017  . VITAMIN D 25 Hydroxy (Vit-D Deficiency, Fractures)    Standing Status: Future     Number of Occurrences:      Standing Expiration Date: 01/15/2017  . Thyroid Panel With TSH    Standing Status: Future     Number of Occurrences:      Standing Expiration Date: 01/15/2017    Patient  Instructions (the written plan) were given to the  patient.   Cherre Robins, [facility]   01/16/2016

## 2016-01-16 NOTE — Patient Instructions (Addendum)
[name] , Thank you for taking time to come for your Medicare Wellness Visit. I appreciate your ongoing commitment to your health goals. Please review the following plan we discussed and let me know if I can assist you in the future.   These are the goals we discussed:  Blood glucose looks great - continue your current medications.  Your current BMI is 32.7 (goal is less than 30.0) - you have lost 2lbs since our last visit.   Continue to exercise as you can - goal is 150 minutes per week  Increase non-starchy vegetables - carrots, green bean, squash, zucchini, tomatoes, onions, peppers, spinach and other green leafy vegetables, cabbage, lettuce, cucumbers, asparagus, okra (not fried), eggplant limit sugar and processed foods (cakes, cookies, ice cream, crackers and chips) Increase fresh fruit but limit serving sizes 1/2 cup or about the size of tennis or baseball limit red meat to no more than 1-2 times per week (serving size about the size of your palm) Choose whole grains / lean proteins - whole wheat bread, quinoa, whole grain rice (1/2 cup), fish, chicken, [place]   This is a list of the screening recommended for you and due dates:  Health Maintenance  Topic Date Due  . Eye exam for diabetics  03/25/2016  . Complete foot exam   05/12/2016  . Flu Shot  05/25/2016  . Hemoglobin A1C  06/16/2016  . Mammogram  12/09/2016  . DEXA scan (bone density measurement)  03/19/2017  . Colon Cancer Screening  12/24/2019  . Tetanus Vaccine  11/27/2024  . Shingles Vaccine  Completed  . Pneumonia vaccines  Completed    Health Maintenance, Female Adopting a healthy lifestyle and getting preventive care can go a long way to promote health and wellness. Talk with your health care provider about what schedule of regular examinations is right for you. This is a good chance for you to check in with your provider about disease prevention and staying healthy. In between checkups, there are plenty of  things you can do on your own. Experts have done a lot of research about which lifestyle changes and preventive measures are most likely to keep you healthy. Ask your health care provider for more information. WEIGHT AND DIET  Eat a healthy diet  Be sure to include plenty of vegetables, fruits, low-fat dairy products, and lean protein.  Do not eat a lot of foods high in solid fats, added sugars, or salt.  Get regular exercise. This is one of the most important things you can do for your health.  Most adults should exercise for at least 150 minutes each week. The exercise should increase your heart rate and make you sweat (moderate-intensity exercise).  Most adults should also do strengthening exercises at least twice a week. This is in addition to the moderate-intensity exercise.  Maintain a healthy weight  Body mass index (BMI) is a measurement that can be used to identify possible weight problems. It estimates body fat based on height and weight. Your health care provider can help determine your BMI and help you achieve or maintain a healthy weight.  For females 76 years of age and older:   A BMI below 18.5 is considered underweight.  A BMI of 18.5 to 24.9 is normal.  A BMI of 25 to 29.9 is considered overweight.  A BMI of 30 and above is considered obese.  Watch levels of cholesterol and blood lipids  You should start having your blood tested for lipids and  cholesterol at 75 years of age, then have this test every 5 years.  You may need to have your cholesterol levels checked more often if:  Your lipid or cholesterol levels are high.  You are older than 76 years of age.  You are at high risk for heart disease.  CANCER SCREENING   Lung Cancer  Lung cancer screening is recommended for adults 69-58 years old who are at high risk for lung cancer because of a history of smoking.  A yearly low-dose CT scan of the lungs is recommended for people who:  Currently  smoke.  Have quit within the past 15 years.  Have at least a 30-pack-year history of smoking. A pack year is smoking an average of one pack of cigarettes a day for 1 year.  Yearly screening should continue until it has been 15 years since you quit.  Yearly screening should stop if you develop a health problem that would prevent you from having lung cancer treatment.  Breast Cancer  Practice breast self-awareness. This means understanding how your breasts normally appear and feel.  It also means doing regular breast self-exams. Let your health care provider know about any changes, no matter how small.  If you are in your 20s or 30s, you should have a clinical breast exam (CBE) by a health care provider every 1-3 years as part of a regular health exam.  If you are 41 or older, have a CBE every year. Also consider having a breast X-ray (mammogram) every year.  If you have a family history of breast cancer, talk to your health care provider about genetic screening.  If you are at high risk for breast cancer, talk to your health care provider about having an MRI and a mammogram every year.  Breast cancer gene (BRCA) assessment is recommended for women who have family members with BRCA-related cancers. BRCA-related cancers include:  Breast.  Ovarian.  Tubal.  Peritoneal cancers.  Results of the assessment will determine the need for genetic counseling and BRCA1 and BRCA2 testing. Cervical Cancer Your health care provider may recommend that you be screened regularly for cancer of the pelvic organs (ovaries, uterus, and vagina). This screening involves a pelvic examination, including checking for microscopic changes to the surface of your cervix (Pap test). You may be encouraged to have this screening done every 3 years, beginning at age 52.  For women ages 84-65, health care providers may recommend pelvic exams and Pap testing every 3 years, or they may recommend the Pap and pelvic  exam, combined with testing for human papilloma virus (HPV), every 5 years. Some types of HPV increase your risk of cervical cancer. Testing for HPV may also be done on women of any age with unclear Pap test results.  Other health care providers may not recommend any screening for nonpregnant women who are considered low risk for pelvic cancer and who do not have symptoms. Ask your health care provider if a screening pelvic exam is right for you.  If you have had past treatment for cervical cancer or a condition that could lead to cancer, you need Pap tests and screening for cancer for at least 20 years after your treatment. If Pap tests have been discontinued, your risk factors (such as having a new sexual partner) need to be reassessed to determine if screening should resume. Some women have medical problems that increase the chance of getting cervical cancer. In these cases, your health care provider may recommend more frequent  screening and Pap tests. Colorectal Cancer  This type of cancer can be detected and often prevented.  Routine colorectal cancer screening usually begins at 76 years of age and continues through 76 years of age.  Your health care provider may recommend screening at an earlier age if you have risk factors for colon cancer.  Your health care provider may also recommend using home test kits to check for hidden blood in the stool.  A small camera at the end of a tube can be used to examine your colon directly (sigmoidoscopy or colonoscopy). This is done to check for the earliest forms of colorectal cancer.  Routine screening usually begins at age 53.  Direct examination of the colon should be repeated every 5-10 years through 76 years of age. However, you may need to be screened more often if early forms of precancerous polyps or small growths are found. Skin Cancer  Check your skin from head to toe regularly.  Tell your health care provider about any new moles or  changes in moles, especially if there is a change in a mole's shape or color.  Also tell your health care provider if you have a mole that is larger than the size of a pencil eraser.  Always use sunscreen. Apply sunscreen liberally and repeatedly throughout the day.  Protect yourself by wearing long sleeves, pants, a wide-brimmed hat, and sunglasses whenever you are outside. HEART DISEASE, DIABETES, AND HIGH BLOOD PRESSURE   High blood pressure causes heart disease and increases the risk of stroke. High blood pressure is more likely to develop in:  People who have blood pressure in the high end of the normal range (130-139/85-89 mm Hg).  People who are overweight or obese.  People who are African American.  If you are 34-70 years of age, have your blood pressure checked every 3-5 years. If you are 51 years of age or older, have your blood pressure checked every year. You should have your blood pressure measured twice--once when you are at a hospital or clinic, and once when you are not at a hospital or clinic. Record the average of the two measurements. To check your blood pressure when you are not at a hospital or clinic, you can use:  An automated blood pressure machine at a pharmacy.  A home blood pressure monitor.  If you are between 66 years and 35 years old, ask your health care provider if you should take aspirin to prevent strokes.  Have regular diabetes screenings. This involves taking a blood sample to check your fasting blood sugar level.  If you are at a normal weight and have a low risk for diabetes, have this test once every three years after 76 years of age.  If you are overweight and have a high risk for diabetes, consider being tested at a younger age or more often. PREVENTING INFECTION  Hepatitis B  If you have a higher risk for hepatitis B, you should be screened for this virus. You are considered at high risk for hepatitis B if:  You were born in a country where  hepatitis B is common. Ask your health care provider which countries are considered high risk.  Your parents were born in a high-risk country, and you have not been immunized against hepatitis B (hepatitis B vaccine).  You have HIV or AIDS.  You use needles to inject street drugs.  You live with someone who has hepatitis B.  You have had sex with someone who  has hepatitis B.  You get hemodialysis treatment.  You take certain medicines for conditions, including cancer, organ transplantation, and autoimmune conditions. Hepatitis C  Blood testing is recommended for:  Everyone born from 1 through 1966.  Anyone with known risk factors for hepatitis C. Sexually transmitted infections (STIs)  You should be screened for sexually transmitted infections (STIs) including gonorrhea and chlamydia if:  You are sexually active and are younger than 76 years of age.  You are older than 76 years of age and your health care provider tells you that you are at risk for this type of infection.  Your sexual activity has changed since you were last screened and you are at an increased risk for chlamydia or gonorrhea. Ask your health care provider if you are at risk.  If you do not have HIV, but are at risk, it may be recommended that you take a prescription medicine daily to prevent HIV infection. This is called pre-exposure prophylaxis (PrEP). You are considered at risk if:  You are sexually active and do not regularly use condoms or know the HIV status of your partner(s).  You take drugs by injection.  You are sexually active with a partner who has HIV. Talk with your health care provider about whether you are at high risk of being infected with HIV. If you choose to begin PrEP, you should first be tested for HIV. You should then be tested every 3 months for as long as you are taking PrEP.  PREGNANCY   If you are premenopausal and you may become pregnant, ask your health care provider about  preconception counseling.  If you may become pregnant, take 400 to 800 micrograms (mcg) of folic acid every day.  If you want to prevent pregnancy, talk to your health care provider about birth control (contraception). OSTEOPOROSIS AND MENOPAUSE   Osteoporosis is a disease in which the bones lose minerals and strength with aging. This can result in serious bone fractures. Your risk for osteoporosis can be identified using a bone density scan.  If you are 87 years of age or older, or if you are at risk for osteoporosis and fractures, ask your health care provider if you should be screened.  Ask your health care provider whether you should take a calcium or vitamin D supplement to lower your risk for osteoporosis.  Menopause may have certain physical symptoms and risks.  Hormone replacement therapy may reduce some of these symptoms and risks. Talk to your health care provider about whether hormone replacement therapy is right for you.  HOME CARE INSTRUCTIONS   Schedule regular health, dental, and eye exams.  Stay current with your immunizations.   Do not use any tobacco products including cigarettes, chewing tobacco, or electronic cigarettes.  If you are pregnant, do not drink alcohol.  If you are breastfeeding, limit how much and how often you drink alcohol.  Limit alcohol intake to no more than 1 drink per day for nonpregnant women. One drink equals 12 ounces of beer, 5 ounces of wine, or 1 ounces of hard liquor.  Do not use street drugs.  Do not share needles.  Ask your health care provider for help if you need support or information about quitting drugs.  Tell your health care provider if you often feel depressed.  Tell your health care provider if you have ever been abused or do not feel safe at home.   This information is not intended to replace advice given to you by  your health care provider. Make sure you discuss any questions you have with your health care  provider.   Document Released: 04/26/2011 Document Revised: 11/01/2014 Document Reviewed: 09/12/2013 Elsevier Interactive Patient Education [employer].

## 2016-01-19 ENCOUNTER — Ambulatory Visit (INDEPENDENT_AMBULATORY_CARE_PROVIDER_SITE_OTHER): Payer: Medicare Other

## 2016-01-19 ENCOUNTER — Ambulatory Visit (INDEPENDENT_AMBULATORY_CARE_PROVIDER_SITE_OTHER): Payer: Medicare Other | Admitting: Family Medicine

## 2016-01-19 ENCOUNTER — Encounter: Payer: Self-pay | Admitting: Family Medicine

## 2016-01-19 VITALS — BP 105/61 | HR 70 | Temp 97.9°F | Ht 64.25 in | Wt 194.8 lb

## 2016-01-19 DIAGNOSIS — Z794 Long term (current) use of insulin: Secondary | ICD-10-CM | POA: Diagnosis not present

## 2016-01-19 DIAGNOSIS — Z0000 Encounter for general adult medical examination without abnormal findings: Secondary | ICD-10-CM | POA: Diagnosis not present

## 2016-01-19 DIAGNOSIS — E119 Type 2 diabetes mellitus without complications: Secondary | ICD-10-CM | POA: Diagnosis not present

## 2016-01-19 DIAGNOSIS — Z01818 Encounter for other preprocedural examination: Secondary | ICD-10-CM

## 2016-01-19 NOTE — Progress Notes (Signed)
   HPI  Patient presents today here for preoperative clearance.  Patient has a breast lump and is scheduled for a lumpectomy in 3 days. She denies any chest pain or abnormal exertional dyspnea.she states that she can easily walk up a flight of stairs without having severe dyspnea or chest pain. She also walk more than 2 blocks without difficulty.  She denies any chest pain. She feels well. She does not have any concerns otherwise.  PMH: Smoking status noted ROS: Per HPI  Objective: BP 105/61 mmHg  Pulse 70  Temp(Src) 97.9 F (36.6 C) (Oral)  Ht 5' 4.25" (1.632 m)  Wt 194 lb 12.8 oz (88.361 kg)  BMI 33.18 kg/m2 Gen: NAD, alert, cooperative with exam HEENT: NCAT CV: RRR, good S1/S2, quiet [id number] systolic murmur at the R sternal border Resp: CTABL, no wheezes, non-labored Abd: SNTND, BS present, no guarding or organomegaly Ext: No edema, warm Neuro: Alert and oriented, No gross deficits    2 view chest x-ray:No acute findings  Assessment and plan:  # Pre-op clearance EKG unchanged >4 mets of activity easily, minor breast surgery Clear for surgery, discussed increased risk withy DM2 (although well controlled) and age but she will proceed.  CXR appears normal as well.     Orders Placed This Encounter  Procedures  . DG Chest 2 View    Standing Status: Future     Number of Occurrences:      Standing Expiration Date: 03/20/2017    Order Specific Question:  Reason for Exam (SYMPTOM  OR DIAGNOSIS REQUIRED)    Answer:  pre-op eval    Order Specific Question:  Preferred imaging location?    Answer:  Internal  . EKG 12-Lead    Laroy Apple, MD [facility] 01/19/2016, 11:12 AM

## 2016-01-19 NOTE — Addendum Note (Signed)
Addended by: Earlene Plater on: 01/19/2016 04:32 PM   Modules accepted: Orders

## 2016-01-19 NOTE — Addendum Note (Signed)
Addended by: Timmothy Euler on: 01/19/2016 04:23 PM   Modules accepted: Orders

## 2016-01-19 NOTE — Patient Instructions (Signed)
Great to meet you! 

## 2016-01-20 ENCOUNTER — Telehealth: Payer: Self-pay | Admitting: Pediatrics

## 2016-01-20 DIAGNOSIS — Z794 Long term (current) use of insulin: Principal | ICD-10-CM

## 2016-01-20 DIAGNOSIS — E119 Type 2 diabetes mellitus without complications: Secondary | ICD-10-CM

## 2016-01-20 LAB — CBC WITH DIFFERENTIAL/PLATELET
BASOS ABS: 0 10*3/uL (ref 0.0–0.2)
Basos: 0 %
EOS (ABSOLUTE): 0.1 10*3/uL (ref 0.0–0.4)
EOS: 2 %
HEMATOCRIT: 41.3 % (ref 34.0–46.6)
Hemoglobin: 13.8 g/dL (ref 11.1–15.9)
Immature Grans (Abs): 0 10*3/uL (ref 0.0–0.1)
Immature Granulocytes: 0 %
LYMPHS ABS: 1.5 10*3/uL (ref 0.7–3.1)
Lymphs: 24 %
MCH: 31.8 pg (ref 26.6–33.0)
MCHC: 33.4 g/dL (ref 31.5–35.7)
MCV: 95 fL (ref 79–97)
MONOS ABS: 0.5 10*3/uL (ref 0.1–0.9)
Monocytes: 7 %
NEUTROS PCT: 67 %
Neutrophils Absolute: 4.1 10*3/uL (ref 1.4–7.0)
PLATELETS: 174 10*3/uL (ref 150–379)
RBC: 4.34 x10E6/uL (ref 3.77–5.28)
RDW: 13.6 % (ref 12.3–15.4)
WBC: 6.2 10*3/uL (ref 3.4–10.8)

## 2016-01-20 LAB — CMP14+EGFR
ALK PHOS: 62 IU/L (ref 39–117)
ALT: 19 IU/L (ref 0–32)
AST: 26 IU/L (ref 0–40)
Albumin/Globulin Ratio: 1.5 (ref 1.2–2.2)
Albumin: 3.8 g/dL (ref 3.5–4.8)
BUN / CREAT RATIO: 21 (ref 11–26)
BUN: 24 mg/dL (ref 8–27)
Bilirubin Total: 0.3 mg/dL (ref 0.0–1.2)
CALCIUM: 8.9 mg/dL (ref 8.7–10.3)
CO2: 28 mmol/L (ref 18–29)
CREATININE: 1.16 mg/dL — AB (ref 0.57–1.00)
Chloride: 96 mmol/L (ref 96–106)
GFR calc Af Amer: 53 mL/min/{1.73_m2} — ABNORMAL LOW (ref >59)
GFR calc non Af Amer: 46 mL/min/{1.73_m2} — ABNORMAL LOW (ref >59)
GLOBULIN, TOTAL: 2.5 g/dL (ref 1.5–4.5)
GLUCOSE: 171 mg/dL — AB (ref 65–99)
Potassium: 4.3 mmol/L (ref 3.5–5.2)
Sodium: 137 mmol/L (ref 134–144)
Total Protein: 6.3 g/dL (ref 6.0–8.5)

## 2016-01-20 NOTE — Telephone Encounter (Signed)
Please address and sign for pt to pick up Pt aware will be ready on Wednesday

## 2016-01-21 ENCOUNTER — Ambulatory Visit
Admission: RE | Admit: 2016-01-21 | Discharge: 2016-01-21 | Disposition: A | Discharge status: Home / Self Care | Payer: Medicare Other | Source: Ambulatory Visit | Attending: General Surgery | Admitting: General Surgery

## 2016-01-21 DIAGNOSIS — N6022 Fibroadenosis of left breast: Secondary | ICD-10-CM

## 2016-01-21 DIAGNOSIS — R928 Other abnormal and inconclusive findings on diagnostic imaging of breast: Secondary | ICD-10-CM | POA: Diagnosis not present

## 2016-01-21 MED ORDER — GLUCOSE BLOOD VI STRP: ORAL_STRIP | Status: DC

## 2016-01-21 NOTE — Telephone Encounter (Signed)
Printed RX with refills. Pt can come pick up.

## 2016-01-22 ENCOUNTER — Ambulatory Visit (HOSPITAL_BASED_OUTPATIENT_CLINIC_OR_DEPARTMENT_OTHER): Payer: Medicare Other | Admitting: Anesthesiology

## 2016-01-22 ENCOUNTER — Encounter (HOSPITAL_BASED_OUTPATIENT_CLINIC_OR_DEPARTMENT_OTHER)
Admission: RE | Disposition: A | Discharge status: Home / Self Care | Payer: Self-pay | Source: Ambulatory Visit | Attending: General Surgery

## 2016-01-22 ENCOUNTER — Encounter (HOSPITAL_BASED_OUTPATIENT_CLINIC_OR_DEPARTMENT_OTHER): Payer: Self-pay

## 2016-01-22 ENCOUNTER — Ambulatory Visit
Admission: RE | Admit: 2016-01-22 | Discharge: 2016-01-22 | Disposition: A | Discharge status: Home / Self Care | Payer: Medicare Other | Source: Ambulatory Visit | Attending: General Surgery | Admitting: General Surgery

## 2016-01-22 ENCOUNTER — Ambulatory Visit (HOSPITAL_BASED_OUTPATIENT_CLINIC_OR_DEPARTMENT_OTHER)
Admission: RE | Admit: 2016-01-22 | Discharge: 2016-01-22 | Disposition: A | Discharge status: Home / Self Care | Payer: Medicare Other | Source: Ambulatory Visit | Attending: General Surgery | Admitting: General Surgery

## 2016-01-22 DIAGNOSIS — Z7982 Long term (current) use of aspirin: Secondary | ICD-10-CM | POA: Diagnosis not present

## 2016-01-22 DIAGNOSIS — F419 Anxiety disorder, unspecified: Secondary | ICD-10-CM | POA: Insufficient documentation

## 2016-01-22 DIAGNOSIS — N6012 Diffuse cystic mastopathy of left breast: Secondary | ICD-10-CM | POA: Diagnosis not present

## 2016-01-22 DIAGNOSIS — E669 Obesity, unspecified: Secondary | ICD-10-CM | POA: Diagnosis not present

## 2016-01-22 DIAGNOSIS — R928 Other abnormal and inconclusive findings on diagnostic imaging of breast: Secondary | ICD-10-CM

## 2016-01-22 DIAGNOSIS — N6092 Unspecified benign mammary dysplasia of left breast: Secondary | ICD-10-CM | POA: Insufficient documentation

## 2016-01-22 DIAGNOSIS — K219 Gastro-esophageal reflux disease without esophagitis: Secondary | ICD-10-CM | POA: Diagnosis not present

## 2016-01-22 DIAGNOSIS — Z794 Long term (current) use of insulin: Secondary | ICD-10-CM | POA: Diagnosis not present

## 2016-01-22 DIAGNOSIS — Z6833 Body mass index (BMI) 33.0-33.9, adult: Secondary | ICD-10-CM | POA: Diagnosis not present

## 2016-01-22 DIAGNOSIS — Z79899 Other long term (current) drug therapy: Secondary | ICD-10-CM | POA: Diagnosis not present

## 2016-01-22 DIAGNOSIS — Z79890 Hormone replacement therapy (postmenopausal): Secondary | ICD-10-CM | POA: Insufficient documentation

## 2016-01-22 DIAGNOSIS — I10 Essential (primary) hypertension: Secondary | ICD-10-CM | POA: Diagnosis not present

## 2016-01-22 DIAGNOSIS — N6489 Other specified disorders of breast: Secondary | ICD-10-CM | POA: Insufficient documentation

## 2016-01-22 DIAGNOSIS — F329 Major depressive disorder, single episode, unspecified: Secondary | ICD-10-CM | POA: Insufficient documentation

## 2016-01-22 DIAGNOSIS — R921 Mammographic calcification found on diagnostic imaging of breast: Secondary | ICD-10-CM | POA: Diagnosis not present

## 2016-01-22 DIAGNOSIS — N6082 Other benign mammary dysplasias of left breast: Secondary | ICD-10-CM | POA: Diagnosis not present

## 2016-01-22 DIAGNOSIS — E7800 Pure hypercholesterolemia, unspecified: Secondary | ICD-10-CM | POA: Diagnosis not present

## 2016-01-22 DIAGNOSIS — E119 Type 2 diabetes mellitus without complications: Secondary | ICD-10-CM | POA: Insufficient documentation

## 2016-01-22 HISTORY — PX: BREAST LUMPECTOMY WITH RADIOACTIVE SEED LOCALIZATION: SHX6424

## 2016-01-22 LAB — GLUCOSE, CAPILLARY
GLUCOSE-CAPILLARY: 127 mg/dL — AB (ref 65–99)
GLUCOSE-CAPILLARY: 124 mg/dL — AB (ref 65–99)

## 2016-01-22 LAB — FECAL OCCULT BLOOD, IMMUNOCHEMICAL: FECAL OCCULT BLD: NEGATIVE

## 2016-01-22 SURGERY — BREAST LUMPECTOMY WITH RADIOACTIVE SEED LOCALIZATION
Anesthesia: General | Site: Breast | Laterality: Left

## 2016-01-22 MED ORDER — FENTANYL CITRATE (PF) 100 MCG/2ML IJ SOLN
50.0000 ug | INTRAMUSCULAR | Status: DC | PRN
  Administered 2016-01-22: 100 ug via INTRAVENOUS

## 2016-01-22 MED ORDER — EPHEDRINE SULFATE 50 MG/ML IJ SOLN
INTRAMUSCULAR | Status: DC | PRN
  Administered 2016-01-22: 10 mg via INTRAVENOUS

## 2016-01-22 MED ORDER — ONDANSETRON HCL 4 MG/2ML IJ SOLN
INTRAMUSCULAR | Status: DC | PRN
  Administered 2016-01-22: 4 mg via INTRAVENOUS

## 2016-01-22 MED ORDER — DEXAMETHASONE SODIUM PHOSPHATE 10 MG/ML IJ SOLN
INTRAMUSCULAR | Status: AC
  Filled 2016-01-22: qty 1

## 2016-01-22 MED ORDER — FENTANYL CITRATE (PF) 100 MCG/2ML IJ SOLN
25.0000 ug | INTRAMUSCULAR | Status: DC | PRN
  Administered 2016-01-22 (×4): 25 ug via INTRAVENOUS

## 2016-01-22 MED ORDER — CHLORHEXIDINE GLUCONATE 4 % EX LIQD: 1.0000 "application " | Freq: Once | CUTANEOUS | Status: DC

## 2016-01-22 MED ORDER — LACTATED RINGERS IV SOLN
INTRAVENOUS | Status: DC
  Administered 2016-01-22 (×2): via INTRAVENOUS

## 2016-01-22 MED ORDER — PROMETHAZINE HCL 25 MG/ML IJ SOLN: 6.2500 mg | INTRAMUSCULAR | Status: DC | PRN

## 2016-01-22 MED ORDER — MEPERIDINE HCL 25 MG/ML IJ SOLN: 6.2500 mg | INTRAMUSCULAR | Status: DC | PRN

## 2016-01-22 MED ORDER — VANCOMYCIN HCL IN DEXTROSE 1-5 GM/200ML-% IV SOLN
INTRAVENOUS | Status: AC
  Filled 2016-01-22: qty 200

## 2016-01-22 MED ORDER — HYDROMORPHONE HCL 4 MG PO TABS: 2.0000 mg | ORAL_TABLET | Freq: Four times a day (QID) | ORAL | Status: DC | PRN

## 2016-01-22 MED ORDER — FENTANYL CITRATE (PF) 100 MCG/2ML IJ SOLN
INTRAMUSCULAR | Status: AC
  Filled 2016-01-22: qty 2

## 2016-01-22 MED ORDER — GLYCOPYRROLATE 0.2 MG/ML IJ SOLN: 0.2000 mg | Freq: Once | INTRAMUSCULAR | Status: DC | PRN

## 2016-01-22 MED ORDER — PROPOFOL 10 MG/ML IV BOLUS
INTRAVENOUS | Status: DC | PRN
  Administered 2016-01-22: 200 mg via INTRAVENOUS

## 2016-01-22 MED ORDER — VANCOMYCIN HCL IN DEXTROSE 1-5 GM/200ML-% IV SOLN
1000.0000 mg | INTRAVENOUS | Status: AC
  Administered 2016-01-22: 1000 mg via INTRAVENOUS

## 2016-01-22 MED ORDER — MIDAZOLAM HCL 2 MG/2ML IJ SOLN: 1.0000 mg | INTRAMUSCULAR | Status: DC | PRN

## 2016-01-22 MED ORDER — BUPIVACAINE-EPINEPHRINE (PF) 0.25% -1:200000 IJ SOLN
INTRAMUSCULAR | Status: AC
  Filled 2016-01-22: qty 30

## 2016-01-22 MED ORDER — PROPOFOL 10 MG/ML IV BOLUS
INTRAVENOUS | Status: AC
  Filled 2016-01-22: qty 20

## 2016-01-22 MED ORDER — LIDOCAINE HCL (CARDIAC) 20 MG/ML IV SOLN
INTRAVENOUS | Status: DC | PRN
  Administered 2016-01-22: 100 mg via INTRAVENOUS

## 2016-01-22 MED ORDER — LIDOCAINE HCL (CARDIAC) 20 MG/ML IV SOLN
INTRAVENOUS | Status: AC
  Filled 2016-01-22: qty 5

## 2016-01-22 MED ORDER — BUPIVACAINE-EPINEPHRINE (PF) 0.25% -1:200000 IJ SOLN
INTRAMUSCULAR | Status: DC | PRN
  Administered 2016-01-22: 18 mL

## 2016-01-22 SURGICAL SUPPLY — 42 items
APPLIER CLIP 9.375 MED OPEN (MISCELLANEOUS) ×3
APR CLP MED 9.3 20 MLT OPN (MISCELLANEOUS) ×1
BLADE SURG 15 STRL LF DISP TIS (BLADE) ×1 IMPLANT
BLADE SURG 15 STRL SS (BLADE) ×3
CANISTER SUC SOCK COL 7IN (MISCELLANEOUS) ×1 IMPLANT
CANISTER SUCT 1200ML W/VALVE (MISCELLANEOUS) ×1 IMPLANT
CHLORAPREP W/TINT 26ML (MISCELLANEOUS) ×3 IMPLANT
CLIP APPLIE 9.375 MED OPEN (MISCELLANEOUS) IMPLANT
COVER BACK TABLE 60X90IN (DRAPES) ×3 IMPLANT
COVER MAYO STAND STRL (DRAPES) ×3 IMPLANT
COVER PROBE W GEL 5X96 (DRAPES) ×3 IMPLANT
DECANTER SPIKE VIAL GLASS SM (MISCELLANEOUS) IMPLANT
DEVICE DUBIN W/COMP PLATE 8390 (MISCELLANEOUS) ×3 IMPLANT
DRAPE LAPAROSCOPIC ABDOMINAL (DRAPES) ×2 IMPLANT
DRAPE UTILITY XL STRL (DRAPES) ×3 IMPLANT
ELECT COATED BLADE 2.86 ST (ELECTRODE) ×3 IMPLANT
ELECT REM PT RETURN 9FT ADLT (ELECTROSURGICAL) ×3
ELECTRODE REM PT RTRN 9FT ADLT (ELECTROSURGICAL) ×1 IMPLANT
GLOVE BIO SURGEON STRL SZ7.5 (GLOVE) ×2 IMPLANT
GLOVE EXAM NITRILE EXT CUFF MD (GLOVE) ×2 IMPLANT
GLOVE SURG SS PI 7.0 STRL IVOR (GLOVE) ×4 IMPLANT
GLOVE SURG SS PI 7.5 STRL IVOR (GLOVE) ×4 IMPLANT
GOWN STRL REUS W/ TWL LRG LVL3 (GOWN DISPOSABLE) ×2 IMPLANT
GOWN STRL REUS W/TWL LRG LVL3 (GOWN DISPOSABLE) ×6
KIT MARKER MARGIN INK (KITS) ×3 IMPLANT
LIQUID BAND (GAUZE/BANDAGES/DRESSINGS) ×3 IMPLANT
NDL HYPO 25X1 1.5 SAFETY (NEEDLE) IMPLANT
NEEDLE HYPO 25X1 1.5 SAFETY (NEEDLE) ×3 IMPLANT
NS IRRIG 1000ML POUR BTL (IV SOLUTION) ×2 IMPLANT
PACK BASIN DAY SURGERY FS (CUSTOM PROCEDURE TRAY) ×3 IMPLANT
PENCIL BUTTON HOLSTER BLD 10FT (ELECTRODE) ×3 IMPLANT
SLEEVE SCD COMPRESS KNEE MED (MISCELLANEOUS) ×3 IMPLANT
SPONGE LAP 18X18 X RAY DECT (DISPOSABLE) ×3 IMPLANT
SUT MON AB 4-0 PC3 18 (SUTURE) ×2 IMPLANT
SUT SILK 2 0 SH (SUTURE) IMPLANT
SUT VICRYL 3-0 CR8 SH (SUTURE) ×3 IMPLANT
SYR CONTROL 10ML LL (SYRINGE) ×2 IMPLANT
TOWEL OR 17X24 6PK STRL BLUE (TOWEL DISPOSABLE) ×3 IMPLANT
TOWEL OR NON WOVEN STRL DISP B (DISPOSABLE) ×1 IMPLANT
TUBE CONNECTING 20'X1/4 (TUBING)
TUBE CONNECTING 20X1/4 (TUBING) ×1 IMPLANT
YANKAUER SUCT BULB TIP NO VENT (SUCTIONS) IMPLANT

## 2016-01-22 NOTE — H&P (Signed)
[name]. [name]  Location: [facility] Patient #: [id number] DOB: 1941-01-19 Widowed / Language: [name] / Race: White Female   History of Present Illness Patient words: LEFT BREAST CSL.  The patient is a 75 year old female who presents with a breast mass. We are asked to see the patient in consultation by Dr. Pamelia Hoit to evaluate her for a left breast complex sclerosing lesion. The patient is a 75 year old white female who recently went for a routine screening mammogram. At that time she was found to have an architectural distortion in the upper outer left breast that measured about 1 cm. This area was biopsied and came back as a complex sclerosing lesion. She has no family history of breast cancer. She denies any breast pain or discharge from the nipple.   Other Problems  Anxiety Disorder Arthritis Back Pain Bladder Problems Cholelithiasis Depression Diabetes Mellitus Gastroesophageal Reflux Disease High blood pressure Hypercholesterolemia Lump In Breast Migraine Headache Pancreatitis  Past Surgical History  Breast Biopsy Left. Cataract Surgery Bilateral. Gallbladder Surgery - Laparoscopic Knee Surgery Left. Oral Surgery  Diagnostic Studies History  Colonoscopy 5-10 years ago Mammogram within last year Pap Smear 1-5 years ago  Allergies  Ambien *HYPNOTICS/SEDATIVES/SLEEP DISORDER AGENTS* Amoxicillin *PENICILLINS* CeleBREX *ANALGESICS - ANTI-INFLAMMATORY* Fluticasone Propionate *CHEMICALS* Hydrocodone-Acetaminophen *ANALGESICS - OPIOID* Hyosyne *ULCER DRUGS* Mobic *ANALGESICS - ANTI-INFLAMMATORY* OxyCODONE HCl (Abuse Deter) *ANALGESICS - OPIOID* TraMADol HCl *ANALGESICS - OPIOID* Vicoprofen *ANALGESICS - OPIOID* Zolpidem Tartrate *HYPNOTICS/SEDATIVES/SLEEP DISORDER AGENTS* Sulfa 10 *OPHTHALMIC AGENTS* Neurontin *ANTICONVULSANTS*  Medication History  Losartan Potassium (50MG  Tablet, Oral)  Active. Nystatin-Triamcinolone (100000-0.1UNIT/GM-% Ointment, External) Active. Nystop (100000 UNIT/GM Powder, External) Active. Lantus SoloStar (100UNIT/ML Soln Pen-inj, Subcutaneous) Active. Escitalopram Oxalate (20MG  Tablet, Oral) Active. DULoxetine HCl (20MG  Capsule DR Part, Oral) Active. Desonide (0.05% Cream, External) Active. Aspirin (81MG  Tablet, Oral) Active. Atorvastatin Calcium (20MG  Tablet, Oral) Active. Calcium Carbonate (600MG  Tablet, Oral) Active. Dulaglutide (0.75MG /0.5ML Soln Pen-inj, Subcutaneous) Active. Esomeprazole Magnesium (20MG  Capsule DR, Oral) Active. Estradiol (0.1MG /GM Cream, Vaginal) Active. Metoprolol Succinate ER (25MG  Tablet ER 24HR, Oral) Active. Myrbetriq (50MG  Tablet ER 24HR, Oral) Active. Multi Vitamin (Oral) Active. MiraLax (Oral) Active. Probiotic (Oral) Active. TraZODone HCl (50MG  Tablet, Oral) Active. Medications Reconciled  Social History  Caffeine use Tea. No alcohol use No drug use Tobacco use Never smoker.  Family History  Alcohol Abuse Son. Arthritis Father, Mother. Depression Daughter, Father, Mother, Sister, Son. Diabetes Mellitus Father, Mother. Heart Disease Father, Mother. Heart disease in female family member before age 66 Heart disease in female family member before age 22 Hypertension Father, Mother, Sister. Kidney Disease Mother. Prostate Cancer Father.  Pregnancy / Birth History  Age at menarche 69 years. Age of menopause 51-55 Contraceptive History Oral contraceptives. Gravida 4 Maternal age 48-25 Para 3    Review of Systems  General Present- Fatigue. Not Present- Appetite Loss, Chills, Fever, Night Sweats, Weight Gain and Weight Loss. Skin Present- Non-Healing Wounds. Not Present- Change in Wart/Mole, Dryness, Hives, Jaundice, New Lesions, Rash and Ulcer. HEENT Present- Seasonal Allergies, Sinus Pain and Wears glasses/contact lenses. Not Present- Earache, Hearing Loss,  Hoarseness, Nose Bleed, Oral Ulcers, Ringing in the Ears, Sore Throat, Visual Disturbances and Yellow Eyes. Breast Present- Breast Mass. Not Present- Breast Pain, Nipple Discharge and Skin Changes. Cardiovascular Present- Shortness of Breath. Not Present- Chest Pain, Difficulty Breathing Lying Down, Leg Cramps, Palpitations, Rapid Heart Rate and Swelling of Extremities. Gastrointestinal Present- Constipation. Not Present- Abdominal Pain, Bloating, Bloody Stool, Change in Bowel Habits, Chronic diarrhea, Difficulty Swallowing, Excessive gas, Gets full quickly  at meals, Hemorrhoids, Indigestion, Nausea, Rectal Pain and Vomiting. Female Genitourinary Present- Nocturia. Not Present- Frequency, Painful Urination, Pelvic Pain and Urgency. Musculoskeletal Present- Back Pain, Joint Pain, Muscle Pain and Muscle Weakness. Not Present- Joint Stiffness and Swelling of Extremities. Neurological Present- Trouble walking and Weakness. Not Present- Decreased Memory, Fainting, Headaches, Numbness, Seizures, Tingling and Tremor. Psychiatric Present- Anxiety and Depression. Not Present- Bipolar, Change in Sleep Pattern, Fearful and Frequent crying. Endocrine Present- Cold Intolerance. Not Present- Excessive Hunger, Hair Changes, Heat Intolerance, Hot flashes and New Diabetes. Hematology Present- Easy Bruising. Not Present- Excessive bleeding, Gland problems, HIV and Persistent Infections.  Vitals  Weight: 194.13 lb Height: 63.5in Body Surface Area: 1.92 m Body Mass Index: 33.85 kg/m  BP: 124/76 (Sitting, Left Arm, Standard)       Physical Exam  General Mental Status-Alert. General Appearance-Consistent with stated age. Hydration-Well hydrated. Voice-Normal.  Head and Neck Head-normocephalic, atraumatic with no lesions or palpable masses. Trachea-midline. Thyroid Gland Characteristics - normal size and consistency.  Eye Eyeball - Bilateral-Extraocular movements  intact. Sclera/Conjunctiva - Bilateral-No scleral icterus.  Chest and Lung Exam Chest and lung exam reveals -quiet, even and easy respiratory effort with no use of accessory muscles and on auscultation, normal breath sounds, no adventitious sounds and normal vocal resonance. Inspection Chest Wall - Normal. Back - normal.  Breast Note: There is a palpable bruising in the upper outer left breast. There is also a small round mobile palpable nodule measuring just under a centimeter in the lower outer quadrant of the left breast. There is no palpable mass in the right breast. There is no palpable axillary, supraclavicular, or cervical lymphadenopathy.   Cardiovascular Cardiovascular examination reveals -normal heart sounds, regular rate and rhythm with no murmurs and normal pedal pulses bilaterally.  Abdomen Inspection Inspection of the abdomen reveals - No Hernias. Skin - Scar - no surgical scars. Palpation/Percussion Palpation and Percussion of the abdomen reveal - Soft, Non Tender, No Rebound tenderness, No Rigidity (guarding) and No hepatosplenomegaly. Auscultation Auscultation of the abdomen reveals - Bowel sounds normal.  Neurologic Neurologic evaluation reveals -alert and oriented x 3 with no impairment of recent or remote memory. Mental Status-Normal.  Musculoskeletal Normal Exam - Left-Upper Extremity Strength Normal and Lower Extremity Strength Normal. Normal Exam - Right-Upper Extremity Strength Normal and Lower Extremity Strength Normal.  Lymphatic Head & Neck  General Head & Neck Lymphatics: Bilateral - Description - Normal. Axillary  General Axillary Region: Bilateral - Description - Normal. Tenderness - Non Tender. Femoral & Inguinal  Generalized Femoral & Inguinal Lymphatics: Bilateral - Description - Normal. Tenderness - Non Tender.    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 Because of its abnormal appearance and because it can be considered a high risk lesion I would recommend having this area removed. She would also like to have this done. I have discussed with her in detail the risks and benefits of the operation to remove this area as well as some of the technical aspects and she understands and wishes to proceed. I will plan for a left breast radioactive seed localized lumpectomy Current Plans Pt Education - Breast Diseases: discussed with patient and provided information.   Signed by Luella Cook, MD

## 2016-01-22 NOTE — Interval H&P Note (Signed)
History and Physical Interval Note:  01/22/2016 1:57 PM  [name]  has presented today for surgery, with the diagnosis of left breast complex sclerosing lesion  The various methods of treatment have been discussed with the patient and family. After consideration of risks, benefits and other options for treatment, the patient has consented to  Procedure(s): LEFT BREAST LUMPECTOMY WITH RADIOACTIVE SEED LOCALIZATION (Left) as a surgical intervention .  The patient's history has been reviewed, patient examined, no change in status, stable for surgery.  I have reviewed the patient's chart and labs.  Questions were answered to the patient's satisfaction.     TOTH III,Durene Dodge S

## 2016-01-22 NOTE — Discharge Instructions (Signed)

## 2016-01-22 NOTE — Op Note (Signed)
01/22/2016  2:59 PM  PATIENT:  [name]  75 y.o. female  PRE-OPERATIVE DIAGNOSIS:  left breast complex sclerosing lesion  POST-OPERATIVE DIAGNOSIS:  left breast complex sclerosing lesion  PROCEDURE:  Procedure(s): LEFT BREAST LUMPECTOMY WITH RADIOACTIVE SEED LOCALIZATION (Left)  SURGEON:  Surgeon(s) and Role:    * Jovita Kussmaul, MD - Primary  PHYSICIAN ASSISTANT:   ASSISTANTS: none   ANESTHESIA:   general  EBL:  Total I/O In: 400 [I.V.:400] Out: -   BLOOD ADMINISTERED:none  DRAINS: none   LOCAL MEDICATIONS USED:  MARCAINE     SPECIMEN:  Source of Specimen:  left breast tissue  DISPOSITION OF SPECIMEN:  PATHOLOGY  COUNTS:  YES  TOURNIQUET:  * No tourniquets in log *  DICTATION: .Dragon Dictation   After informed consent was obtained the patient was brought to the operating room and placed in the supine position on the operating room table. After adequate induction of general anesthesia the patient's left breast was prepped with ChloraPrep, allowed to dry, and draped in usual sterile manner. An appropriate timeout was performed. Previously an I-125 seed was placed in the upper portion of the left breast mark an area of a complex sclerosing lesion. The neoprobe was set to I-125 in the area of the radioactive seed was readily identified and the upper aspect of the left breast. An elliptical incision was made in the skin overlying the radioactive seed with a 15 blade knife. The incision was carried through the skin and subcutaneous anus tissue sharply with electrocautery. While checking the area of the radioactive seed frequently with the neoprobe a circular portion of breast tissue was excised sharply around the radioactive seed. Once the specimen was removed it was oriented with the appropriate paint colors. A specimen radiograph showed the clip and seed to be in the center of the specimen. The specimen was then sent to pathology for further evaluation. Hemostasis was  achieved using the Bovie electrocautery. The wound was then infiltrated with quarter percent Marcaine and irrigated with saline. The deep layer of the wound was closed in layers of interrupted 3-0 Vicryl stitches. The skin was then closed with interrupted 4-0 Monocryl subcuticular stitches. Dermabond dressings were applied. The patient tolerated the procedure well. At the end of the case all needle sponge and instrument counts were correct. The patient was then awakened and taken to recovery in stable condition.  PLAN OF CARE: Discharge to home after PACU  PATIENT DISPOSITION:  PACU - hemodynamically stable.   Delay start of Pharmacological VTE agent (>24hrs) due to surgical blood loss or risk of bleeding: not applicable

## 2016-01-22 NOTE — Anesthesia Preprocedure Evaluation (Signed)
Anesthesia Evaluation  Patient identified by MRN, date of birth, ID band Patient awake    Reviewed: Allergy & Precautions, H&P , NPO status , Patient's Chart, lab work & pertinent test results, reviewed documented beta blocker date and time   Airway Mallampati: II  TM Distance: >3 FB Neck ROM: full    Dental no notable dental hx. (+) Poor Dentition, Teeth Intact, Partial Lower   Pulmonary neg pulmonary ROS,    Pulmonary exam normal        Cardiovascular hypertension, Pt. on medications Normal cardiovascular exam     Neuro/Psych    GI/Hepatic Neg liver ROS, GERD  Medicated and Controlled,  Endo/Other  negative endocrine ROSdiabetes  Renal/GU      Musculoskeletal   Abdominal (+) + obese,   Peds  Hematology negative hematology ROS (+)   Anesthesia Other Findings   Reproductive/Obstetrics negative OB ROS                             Anesthesia Physical Anesthesia Plan  ASA: II  Anesthesia Plan: General   Post-op Pain Management:    Induction: Intravenous  Airway Management Planned:   Additional Equipment:   Intra-op Plan:   Post-operative Plan:   Informed Consent: I have reviewed the patients History and Physical, chart, labs and discussed the procedure including the risks, benefits and alternatives for the proposed anesthesia with the patient or authorized representative who has indicated his/her understanding and acceptance.     Plan Discussed with: CRNA  Anesthesia Plan Comments:         Anesthesia Quick Evaluation

## 2016-01-22 NOTE — Transfer of Care (Signed)
Immediate Anesthesia Transfer of Care Note  Patient: [name]  Procedure(s) Performed: Procedure(s): LEFT BREAST LUMPECTOMY WITH RADIOACTIVE SEED LOCALIZATION (Left)  Patient Location: PACU  Anesthesia Type:General  Level of Consciousness: awake, alert  and oriented  Airway & Oxygen Therapy: Patient Spontanous Breathing and Patient connected to face mask oxygen  Post-op Assessment: Report given to RN and Post -op Vital signs reviewed and stable  Post vital signs: Reviewed and stable  Last Vitals:  Filed Vitals:   01/22/16 1204  BP: 138/55  Pulse: 68  Temp: 36.6 C  Resp: 20    Complications: No apparent anesthesia complications

## 2016-01-22 NOTE — Anesthesia Procedure Notes (Signed)
Procedure Name: LMA Insertion Date/Time: 01/22/2016 2:13 PM Performed by: Maryella Shivers Pre-anesthesia Checklist: Patient identified, Emergency Drugs available, Suction available and Patient being monitored Patient Re-evaluated:Patient Re-evaluated prior to inductionOxygen Delivery Method: Circle System Utilized Preoxygenation: Pre-oxygenation with 100% oxygen Intubation Type: IV induction Ventilation: Mask ventilation without difficulty LMA: LMA inserted LMA Size: 4.0 Number of attempts: 1 Airway Equipment and Method: Bite block Placement Confirmation: positive ETCO2 Tube secured with: Tape Dental Injury: Teeth and Oropharynx as per pre-operative assessment

## 2016-01-23 ENCOUNTER — Encounter (HOSPITAL_BASED_OUTPATIENT_CLINIC_OR_DEPARTMENT_OTHER): Payer: Self-pay | Admitting: General Surgery

## 2016-01-23 ENCOUNTER — Other Ambulatory Visit: Payer: Self-pay | Admitting: Pediatrics

## 2016-01-23 NOTE — Telephone Encounter (Signed)
Patient aware that rx is ready to be picked up.  

## 2016-01-23 NOTE — Anesthesia Postprocedure Evaluation (Signed)
Anesthesia Post Note  Patient: [name]  Procedure(s) Performed: Procedure(s) (LRB): LEFT BREAST LUMPECTOMY WITH RADIOACTIVE SEED LOCALIZATION (Left)  Patient location during evaluation: PACU Anesthesia Type: General Level of consciousness: awake Pain management: pain level controlled Vital Signs Assessment: post-procedure vital signs reviewed and stable Respiratory status: spontaneous breathing Cardiovascular status: stable Postop Assessment: no signs of nausea or vomiting Anesthetic complications: no    Last Vitals:  Filed Vitals:   01/22/16 1615 01/22/16 1700  BP: 145/60 170/84  Pulse: 87 90  Temp:    Resp: 15 16    Last Pain:  Filed Vitals:   01/23/16 0926  PainSc: 0-No pain                 Nicola Heinemann JR,JOHN Mateo [facility]

## 2016-02-12 ENCOUNTER — Ambulatory Visit (INDEPENDENT_AMBULATORY_CARE_PROVIDER_SITE_OTHER): Payer: Medicare Other | Admitting: Ophthalmology

## 2016-02-18 ENCOUNTER — Ambulatory Visit (INDEPENDENT_AMBULATORY_CARE_PROVIDER_SITE_OTHER): Payer: Medicare Other | Admitting: Pharmacist

## 2016-02-18 ENCOUNTER — Encounter: Payer: Self-pay | Admitting: Pharmacist

## 2016-02-18 VITALS — BP 110/70 | HR 72 | Ht 64.0 in | Wt 193.0 lb

## 2016-02-18 DIAGNOSIS — R5382 Chronic fatigue, unspecified: Secondary | ICD-10-CM | POA: Diagnosis not present

## 2016-02-18 DIAGNOSIS — E119 Type 2 diabetes mellitus without complications: Secondary | ICD-10-CM | POA: Diagnosis not present

## 2016-02-18 DIAGNOSIS — N760 Acute vaginitis: Secondary | ICD-10-CM

## 2016-02-18 DIAGNOSIS — B372 Candidiasis of skin and nail: Secondary | ICD-10-CM

## 2016-02-18 DIAGNOSIS — Z794 Long term (current) use of insulin: Secondary | ICD-10-CM | POA: Diagnosis not present

## 2016-02-18 MED ORDER — SUVOREXANT 10 MG PO TABS: 10.0000 mg | ORAL_TABLET | Freq: Every day | ORAL | Status: DC

## 2016-02-18 MED ORDER — SUVOREXANT 15 MG PO TABS: 15.0000 mg | ORAL_TABLET | Freq: Every day | ORAL | Status: DC

## 2016-02-18 MED ORDER — SUVOREXANT 20 MG PO TABS: 20.0000 mg | ORAL_TABLET | Freq: Every evening | ORAL | Status: DC | PRN

## 2016-02-18 MED ORDER — NYSTATIN 100000 UNIT/GM EX POWD: CUTANEOUS | Status: DC

## 2016-02-18 NOTE — Patient Instructions (Signed)
Stop Trazadone  Start Belsomra 10mg  - take 1 tablet 30 minutes prior to bedtime.  If still not sleeping well after 5 to 7 days may increase to 15mg  tablets - take 1 tablet 30 minutes prior to sleep.  Again if after 5 to 7 days you are still not sleeping well then increase to 20 mg tablets - take 1 tablet 30 minutes prior to sleep.

## 2016-02-18 NOTE — Progress Notes (Signed)
Patient ID: [name], female   DOB: 03-13-1941, 75 y.o.   MRN: [id number] Diabetes Follow-Up Visit Chief Complaint:   Chief Complaint  Patient presents with  . Diabetes  . Fatigue     Filed Vitals:   02/18/16 1138  BP: 110/70  Pulse: 72   HPI:  Diabetes: patient has long standing diabetes.  Her most recent A1C was 6.5%.  Trulicity was started [id number].  She has tolerated well.    Current medications for diabetes include -  Trulicity 0.75mg  q week, Lantus 44 units daily and Novolog sliding scale (BG less than 80 - no insulin, 80 to 120 - 3 units, 121 to 150 - 5 units, 151 to 200 - 7 units, 201 to 250 - 8 units, 250 or above - 9 units and call office) Patient has tried metformin in past but stopped because she had low energy.  Once she stopped metformin her energy level improved but she is unsure if it really was metformin or not. She also was taking Jardiance but we stopped due to decrease in GFR to around 30. Current GFR = 46 Checks BG 3 to 4 times per day.  HBG ranges from 88 to 212 (only 1 reading over 200 over last 25 days) Diet - Continues to try to limit calorie or CHO intake.  She states that Trulicity is helping with appetite.   Fatigue / weakness / fibromyalgia -  Patient reports that she easily tires from housework, getting dressed or taking shower.  Has been present for about 3 to 6 months.  She also reports that she sometimes feels "jittery".  She has checked BP and BG and both are WNL.    Current outpatient prescriptions:  .  aspirin 81 MG tablet, Take 81 mg by mouth daily.  , Disp: , Rfl:  .  atorvastatin (LIPITOR) 20 MG tablet, Take 0.5 tablets (10 mg total) by mouth at bedtime., Disp: 45 tablet, Rfl: 3 .  Calcium Carb-Cholecalciferol (CALCIUM 600+D3) 600-200 MG-UNIT TABS, Take 1 tablet by mouth 2 (two) times daily.  , Disp: , Rfl:  .  desonide (DESOWEN) 0.05 % ointment, Apply 1 application topically 2 (two) times daily. , Disp: , Rfl:  .  Dulaglutide (TRULICITY)  [id number] SOPN, Inject 0.75 mg into the skin once a week., Disp: 12 pen, Rfl: 1 .  escitalopram (LEXAPRO) 20 MG tablet, Take 1 tablet (20 mg total) by mouth daily., Disp: 90 tablet, Rfl: 3 .  esomeprazole (NEXIUM) 20 MG capsule, Take 1 capsule (20 mg total) by mouth daily at 12 noon., Disp: 90 capsule, Rfl: 3 .  estradiol (ESTRACE) 0.1 MG/GM vaginal cream, Place 2 g vaginally every Monday, Wednesday, and Friday. , Disp: , Rfl:  .  glucose blood (FREESTYLE LITE) test strip, Use qid to check blood sugar. Dx E11.9, Disp: 200 each, Rfl: 11 .  insulin aspart (NOVOLOG FLEXPEN) 100 UNIT/ML FlexPen, Inject prior to each meal per sliding scale.BG less than 80 - no insulin; 80 to 120 - 3 units; 121 to 150 - 5 units; 151 to 200 - 7 units; 201 to 250 - 8 units; 250 or above - 9 units and call office., Disp: 30 mL, Rfl: 3 .  Insulin Glargine (LANTUS SOLOSTAR) 100 UNIT/ML Solostar Pen, Inject 44 Units into the skin every morning. Please note increase in dose, Disp: 45 mL, Rfl: 0 .  Insulin Pen Needle (NOVOFINE) 30G X 8 MM MISC, Use to inject insulin up to qid., Disp: 400 each,  Rfl: 3 .  losartan (COZAAR) 50 MG tablet, Take 1 tablet (50 mg total) by mouth daily., Disp: 90 tablet, Rfl: 3 .  metoprolol succinate (TOPROL-XL) 25 MG 24 hr tablet, Take 0.5 tablets (12.5 mg total) by mouth daily., Disp: 45 tablet, Rfl: 3 .  mirabegron ER (MYRBETRIQ) 50 MG TB24 tablet, Take 50 mg by mouth daily., Disp: , Rfl:  .  Multiple Vitamin (MULTIVITAMIN) tablet, Take 0.5 tablets by mouth 2 (two) times daily. , Disp: , Rfl:  .  nystatin (NYSTATIN) powder, Apply under bilateral breasts and groin BID, Disp: 60 g, Rfl: 1 .  nystatin-triamcinolone ointment (MYCOLOG), Apply 1 application topically 2 (two) times daily., Disp: 30 g, Rfl: 3 .  Polyethyl Glycol-Propyl Glycol 0.4-0.3 % SOLN, Apply to eye., Disp: , Rfl:  - trazodone 75mg  qhs .  Probiotic Product (PROBIOTIC ADVANCED PO), Take by mouth., Disp: , Rfl:       Lab Results   Component Value Date   HGBA1C 6.5 12/18/2015      Lab Results  Component Value Date   CHOL 132 11/27/2014   HDL 66 11/27/2014   LDLCALC 43 11/27/2014   TRIG 115 11/27/2014   CHOLHDL 2.0 11/27/2014    Filed Vitals:   02/18/16 1138  BP: 110/70  Pulse: 72   Filed Weights   02/18/16 1138  Weight: 193 lb (87.544 kg)   \ Body mass index is 33.11 kg/(m^2).  Assessment: 1.  Diabetes - well controlled with current regimen 2.  Blood Pressure.  At goal today 3.  Obesity - weight has decreased slightly over the last 3 months. 4.  Fatigue / weakness - possible side effect to trazodone  Recommendations: 1.  Medication recommendations at this time are as follows:    Trial of Belsomnra 10mg  qhs in place of trazodone - may increase as needed by 5mg  every 5 to 7 days.  If weakness doesn't improved with above change then other options include switch back to Cymbalta or trial of Savella for fibromyalgia.  Continue Lantus 44 units daily   Continue Trulicity 0.75mg  SQ q week   Continue Novolog sliding scale   BG less than 80 - no insulin    80 to 120 - 3 units    121 to 150 - 5 units    151 to 200 - 7 units    201 to 250 - 8 units    250 or above - 9 units and call office  2.  Reviewed HBG goals:  Fasting 80-130 and 1-2 hour post prandial <180.  Patient is instructed to check BG 3 to 4 times per day.  3.  Continue with diet - limiting high CHO containing foods.  Also restart daily exercise as able - goal is 150 minutes per week 4.  BP goal < 140/80. Continue current BP meds and cheking BP at home once daily to [place], PharmD, CPP, CDE

## 2016-02-20 ENCOUNTER — Encounter: Payer: Self-pay | Admitting: *Deleted

## 2016-02-20 ENCOUNTER — Ambulatory Visit (INDEPENDENT_AMBULATORY_CARE_PROVIDER_SITE_OTHER): Payer: Medicare Other | Admitting: Ophthalmology

## 2016-02-20 DIAGNOSIS — E113393 Type 2 diabetes mellitus with moderate nonproliferative diabetic retinopathy without macular edema, bilateral: Secondary | ICD-10-CM

## 2016-02-20 DIAGNOSIS — E11319 Type 2 diabetes mellitus with unspecified diabetic retinopathy without macular edema: Secondary | ICD-10-CM

## 2016-02-20 DIAGNOSIS — H43813 Vitreous degeneration, bilateral: Secondary | ICD-10-CM | POA: Diagnosis not present

## 2016-02-20 DIAGNOSIS — H35033 Hypertensive retinopathy, bilateral: Secondary | ICD-10-CM | POA: Diagnosis not present

## 2016-02-20 DIAGNOSIS — I10 Essential (primary) hypertension: Secondary | ICD-10-CM | POA: Diagnosis not present

## 2016-02-26 ENCOUNTER — Telehealth: Payer: Self-pay | Admitting: Pediatrics

## 2016-02-26 NOTE — Telephone Encounter (Signed)
Patient states she tried belsomnra 10mg  qhs and felt like she slept worse then when she was taking trazodone.  Recommended that she increase dose to 20mg  to night.  If not improvement then restart trazodone.

## 2016-03-04 ENCOUNTER — Telehealth: Payer: Self-pay | Admitting: Hematology & Oncology

## 2016-03-04 NOTE — Telephone Encounter (Signed)
Rerouted referral from [facility] for high risk appointment (breast) to [facility]. Per patient's request she would like to be seen closer to home and was agreeable to [place]. Spoke with Amy at [facility] today to see if [facility] would received referral - [facility] agreeable. Faxed referral, notes, pathology to [facility] attn: Amy. Left message for patient today informing her that I have spoken with [facility] and rerouted her referral to that office. [facility] will contact patient. Shavon at [facility] informed via [employer].

## 2016-03-17 ENCOUNTER — Ambulatory Visit (INDEPENDENT_AMBULATORY_CARE_PROVIDER_SITE_OTHER): Payer: Medicare Other | Admitting: Family Medicine

## 2016-03-17 ENCOUNTER — Encounter: Payer: Self-pay | Admitting: Family Medicine

## 2016-03-17 VITALS — BP 106/54 | HR 71 | Temp 97.6°F | Ht 64.0 in | Wt 193.0 lb

## 2016-03-17 DIAGNOSIS — R42 Dizziness and giddiness: Secondary | ICD-10-CM | POA: Diagnosis not present

## 2016-03-17 DIAGNOSIS — E785 Hyperlipidemia, unspecified: Secondary | ICD-10-CM

## 2016-03-17 DIAGNOSIS — E119 Type 2 diabetes mellitus without complications: Secondary | ICD-10-CM | POA: Diagnosis not present

## 2016-03-17 DIAGNOSIS — I10 Essential (primary) hypertension: Secondary | ICD-10-CM

## 2016-03-17 DIAGNOSIS — E1169 Type 2 diabetes mellitus with other specified complication: Secondary | ICD-10-CM

## 2016-03-17 DIAGNOSIS — Z794 Long term (current) use of insulin: Secondary | ICD-10-CM

## 2016-03-17 LAB — BAYER DCA HB A1C WAIVED: HB A1C: 6.8 % (ref <7.0)

## 2016-03-17 MED ORDER — LOSARTAN POTASSIUM 25 MG PO TABS: 25.0000 mg | ORAL_TABLET | Freq: Every day | ORAL | Status: DC

## 2016-03-17 NOTE — Patient Instructions (Signed)
Great to see you!  For dizziness: Decrease your losartan to 1/2 pill (25 mg, I have sent a 25 mg pill) Try melatonin instead of trazodone (5 mg)  We will call within 1 week with labs  Lets have you follow up in 3 months for diabetes

## 2016-03-17 NOTE — Progress Notes (Signed)
   HPI  Patient presents today here for follow-up.  Diabetes Feels that she relisted he is doing a very good job for her. Has had a few lows in the 50s and 60s, but overall is not struggling with hyperglycemia Mild diabetic neuropathy Average fasting blood sugar is 120-140, average preprandial is 1:30 to 150 She's taking Lantus 38 units daily, NovoLog per sliding scale, usually 3-5 units per meal.  Dizziness Describes off-and-on symptoms of dizziness, they seem to last all morning whenever they come on, she also feels jittery at that same time. She denies any sweating or associated hypoglycemia or low blood pressures during that time. However she has not been careful to look at her blood pressure or blood sugar during these events. They are different than hypoglycemic events  Hypertension Good medication compliance Average when measured at home is 120s over 80s  Sleeping difficulty She tried belsomra, she states that it actually kept her awake, she did not sleep for 3 straight nights, she restarted trazodone 50 mg.  PMH: Smoking status noted ROS: Per HPI  Objective: BP 106/54 mmHg  Pulse 71  Temp(Src) 97.6 F (36.4 C) (Oral)  Ht '5\' 4"'$  (1.626 m)  Wt 193 lb (87.544 kg)  BMI 33.11 kg/m2 Gen: NAD, alert, cooperative with exam HEENT: NCAT CV: RRR, good S1/S2, no murmur Resp: CTABL, no wheezes, non-labored Ext: No edema, warm Neuro: Alert and oriented, No gross deficits  Vitals - 1 value per visit 03/17/2016 02/18/2016 01/22/2016 01/05/9701  SYSTOLIC [phone]  DIASTOLIC 54 [phone] 61    Assessment and plan:  # Type 2 diabetes Good control, A1c 6.8 Continue Lantus, NovoLog, and trulicity Mild diabetic neuropathy, foot exam normal today The lowest blood sugar I observed on her log was 54, there is one other one in the 60s, overall I think she is doing well.  # Hypertension Slightly low today, on the last 4 checks it's been low 3 times Sounds like it's well-controlled  at home Considering dizziness and persistent low over the last month I will go ahead and decrease losartan slightly, decreased to 25 mg Continue Toprol at 12.5 mg daily  # Dizziness Possibly hypotension, also possibly residual effects from trazodone Try melatonin instead of trazodone  # Hyperlipidemia Rechecking lipids today continue Lipitor    Orders Placed This Encounter  Procedures  . Bayer DCA Hb A1c Waived  . Lipid Panel  . CMP14+EGFR    Meds ordered this encounter  Medications  . traZODone (DESYREL) 50 MG tablet    Sig: Take 50 mg by mouth at bedtime.  Marland Kitchen losartan (COZAAR) 25 MG tablet    Sig: Take 1 tablet (25 mg total) by mouth daily.    Dispense:  90 tablet    Refill:  [place], MD [facility] 03/17/2016, 11:38 AM

## 2016-03-18 LAB — CMP14+EGFR
A/G RATIO: 1.7 (ref 1.2–2.2)
ALT: 32 IU/L (ref 0–32)
AST: 28 IU/L (ref 0–40)
Albumin: 4 g/dL (ref 3.5–4.8)
Alkaline Phosphatase: 70 IU/L (ref 39–117)
BILIRUBIN TOTAL: 0.4 mg/dL (ref 0.0–1.2)
BUN/Creatinine Ratio: 14 (ref 12–28)
BUN: 18 mg/dL (ref 8–27)
CHLORIDE: 96 mmol/L (ref 96–106)
CO2: 25 mmol/L (ref 18–29)
Calcium: 9.3 mg/dL (ref 8.7–10.3)
Creatinine, Ser: 1.28 mg/dL — ABNORMAL HIGH (ref 0.57–1.00)
GFR, EST AFRICAN AMERICAN: 47 mL/min/{1.73_m2} — AB (ref >59)
GFR, EST NON AFRICAN AMERICAN: 41 mL/min/{1.73_m2} — AB (ref >59)
GLOBULIN, TOTAL: 2.3 g/dL (ref 1.5–4.5)
Glucose: 217 mg/dL — ABNORMAL HIGH (ref 65–99)
POTASSIUM: 4.8 mmol/L (ref 3.5–5.2)
Sodium: 139 mmol/L (ref 134–144)
Total Protein: 6.3 g/dL (ref 6.0–8.5)

## 2016-03-18 LAB — LIPID PANEL
CHOLESTEROL TOTAL: 120 mg/dL (ref 100–199)
Chol/HDL Ratio: 2 ratio units (ref 0.0–4.4)
HDL: 60 mg/dL (ref >39)
LDL CALC: 40 mg/dL (ref 0–99)
Triglycerides: 100 mg/dL (ref 0–149)
VLDL CHOLESTEROL CAL: 20 mg/dL (ref 5–40)

## 2016-03-23 DIAGNOSIS — E1142 Type 2 diabetes mellitus with diabetic polyneuropathy: Secondary | ICD-10-CM | POA: Diagnosis not present

## 2016-03-23 DIAGNOSIS — B351 Tinea unguium: Secondary | ICD-10-CM | POA: Diagnosis not present

## 2016-03-23 DIAGNOSIS — L84 Corns and callosities: Secondary | ICD-10-CM | POA: Diagnosis not present

## 2016-03-27 ENCOUNTER — Encounter: Payer: Self-pay | Admitting: Family

## 2016-03-27 ENCOUNTER — Ambulatory Visit (INDEPENDENT_AMBULATORY_CARE_PROVIDER_SITE_OTHER): Payer: Medicare Other | Admitting: Family

## 2016-03-27 VITALS — BP 119/70 | HR 73 | Temp 97.4°F | Ht 64.8 in | Wt 193.0 lb

## 2016-03-27 DIAGNOSIS — H66001 Acute suppurative otitis media without spontaneous rupture of ear drum, right ear: Secondary | ICD-10-CM

## 2016-03-27 DIAGNOSIS — L259 Unspecified contact dermatitis, unspecified cause: Secondary | ICD-10-CM

## 2016-03-27 MED ORDER — METHYLPREDNISOLONE ACETATE 80 MG/ML IJ SUSP
40.0000 mg | Freq: Once | INTRAMUSCULAR | Status: AC
  Administered 2016-03-27: 40 mg via INTRAMUSCULAR

## 2016-03-27 MED ORDER — AZITHROMYCIN 250 MG PO TABS: ORAL_TABLET | ORAL | Status: DC

## 2016-03-27 MED ORDER — METHYLPREDNISOLONE ACETATE 40 MG/ML IJ SUSP: 40.0000 mg | Freq: Once | INTRAMUSCULAR | Status: DC

## 2016-03-27 MED ORDER — TRIAMCINOLONE ACETONIDE 0.025 % EX OINT: 1.0000 "application " | TOPICAL_OINTMENT | Freq: Two times a day (BID) | CUTANEOUS | Status: DC

## 2016-03-27 NOTE — Addendum Note (Signed)
Addended by: Jamelle Haring on: 03/27/2016 11:08 AM   Modules accepted: Orders

## 2016-03-27 NOTE — Patient Instructions (Signed)

## 2016-03-27 NOTE — Progress Notes (Signed)
   Subjective:    Patient ID: [name], female    DOB: 1940/11/09, 75 y.o.   MRN: [id number]  Ear Fullness  There is pain in the right ear. This is a new problem. The current episode started in the past 7 days. The problem occurs constantly. The problem has been unchanged. There has been no fever. The pain is at a severity of 8/10. The pain is moderate. Associated symptoms include hearing loss. Pertinent negatives include no coughing, ear discharge, neck pain or sore throat. She has tried ear drops for the symptoms. The treatment provided mild relief.      Review of Systems  HENT: Positive for hearing loss. Negative for ear discharge and sore throat.   Respiratory: Negative for cough.   Musculoskeletal: Negative for neck pain.  All other systems reviewed and are negative.      Objective:   Physical Exam  Constitutional: She is oriented to person, place, and time. She appears well-developed and well-nourished. No distress.  HENT:  Head: Normocephalic and atraumatic.  Right Ear: There is drainage, swelling and tenderness. Decreased hearing is noted.  Mouth/Throat: Oropharynx is clear and moist.  Eyes: Pupils are equal, round, and reactive to light.  Neck: Normal range of motion. Neck supple. No thyromegaly present.  Cardiovascular: Normal rate, regular rhythm, normal heart sounds and intact distal pulses.   No murmur heard. Pulmonary/Chest: Effort normal and breath sounds normal. No respiratory distress. She has no wheezes.  Abdominal: Soft. Bowel sounds are normal. She exhibits no distension. There is no tenderness.  Musculoskeletal: Normal range of motion. She exhibits no edema or tenderness.  Neurological: She is alert and oriented to person, place, and time.  Skin: Skin is warm and dry. Rash noted. There is erythema.  Generalized erythemas patchy rash around bilateral ears  Psychiatric: She has a normal mood and affect. Her behavior is normal. Judgment and thought content  normal.  Vitals reviewed.     BP 119/70 mmHg  Pulse 73  Temp(Src) 97.4 F (36.3 C) (Oral)  Ht 5' 4.8" (1.646 m)  Wt 193 lb (87.544 kg)  BMI 32.31 kg/m2     Assessment & Plan:  1. Acute suppurative otitis media of right ear without spontaneous rupture of tympanic membrane, recurrence not specified -keep clean and dry -Do not stick anything into ear RTO in 2 weeks - azithromycin (ZITHROMAX Z-PAK) 250 MG tablet; As directed  Dispense: 1 each; Refill: 0  2. Contact dermatitis -Avoid allergens when possible -If not resolved may need to send to dermatologists  - methylPREDNISolone acetate (DEPO-MEDROL) injection 40 mg; Inject 1 mL (40 mg total) into the muscle once. - triamcinolone (KENALOG) 0.025 % ointment; Apply 1 application topically 2 (two) times daily.  Dispense: 30 g; Refill: 0  Evelina Dun, FNP

## 2016-03-27 NOTE — Addendum Note (Signed)
Addended by: Jamelle Haring on: 03/27/2016 10:47 AM   Modules accepted: Orders

## 2016-03-29 ENCOUNTER — Ambulatory Visit (HOSPITAL_COMMUNITY): Payer: Medicare Other | Admitting: Hematology & Oncology

## 2016-03-31 ENCOUNTER — Ambulatory Visit (HOSPITAL_COMMUNITY): Payer: Medicare Other | Admitting: Hematology & Oncology

## 2016-04-05 NOTE — Progress Notes (Signed)
This encounter was created in error - please disregard.

## 2016-04-14 ENCOUNTER — Other Ambulatory Visit: Payer: Self-pay | Admitting: Family Medicine

## 2016-04-14 MED ORDER — DULAGLUTIDE 0.75 MG/0.5ML ~~LOC~~ SOAJ: 0.7500 mg | SUBCUTANEOUS | Status: DC

## 2016-04-15 ENCOUNTER — Ambulatory Visit (INDEPENDENT_AMBULATORY_CARE_PROVIDER_SITE_OTHER): Payer: Medicare Other | Admitting: Family

## 2016-04-15 ENCOUNTER — Encounter: Payer: Self-pay | Admitting: Family

## 2016-04-15 VITALS — BP 129/62 | HR 72 | Temp 97.3°F | Ht 64.8 in | Wt 196.2 lb

## 2016-04-15 DIAGNOSIS — H60391 Other infective otitis externa, right ear: Secondary | ICD-10-CM

## 2016-04-15 MED ORDER — DULAGLUTIDE 0.75 MG/0.5ML ~~LOC~~ SOAJ: 0.7500 mg | SUBCUTANEOUS | Status: DC

## 2016-04-15 MED ORDER — CIPROFLOXACIN-DEXAMETHASONE 0.3-0.1 % OT SUSP: 4.0000 [drp] | Freq: Two times a day (BID) | OTIC | Status: DC

## 2016-04-15 NOTE — Progress Notes (Signed)
   Subjective:    Patient ID: [name], female    DOB: [birth year], 75 y.o.   MRN: [id number]  PT presents to the office today to recheck otitis media of right ear. Pt was given rx of Zpak. Pt states she no longer is having any pain, but still feels as if it "stopped up".  Otalgia  There is pain in the left ear. The current episode started 1 to 4 weeks ago. There has been no fever. The pain is at a severity of 0/10. The patient is experiencing no pain. Associated symptoms include coughing, ear discharge, hearing loss and rhinorrhea. Pertinent negatives include no headaches or sore throat. She has tried antibiotics for the symptoms. The treatment provided moderate relief.      Review of Systems  Constitutional: Negative.   HENT: Positive for ear discharge, ear pain, hearing loss and rhinorrhea. Negative for sore throat.   Eyes: Negative.   Respiratory: Positive for cough. Negative for shortness of breath.   Cardiovascular: Negative.  Negative for palpitations.  Gastrointestinal: Negative.   Endocrine: Negative.   Genitourinary: Negative.   Musculoskeletal: Negative.   Neurological: Negative.  Negative for headaches.  Hematological: Negative.   Psychiatric/Behavioral: Negative.   All other systems reviewed and are negative.      Objective:   Physical Exam  Constitutional: She is oriented to person, place, and time. She appears well-developed and well-nourished. No distress.  HENT:  Head: Normocephalic and atraumatic.  Right Ear: There is drainage, swelling and tenderness. Decreased hearing is noted.  Mouth/Throat: Oropharynx is clear and moist.  Eyes: Pupils are equal, round, and reactive to light.  Neck: Normal range of motion. Neck supple. No thyromegaly present.  Cardiovascular: Normal rate, regular rhythm, normal heart sounds and intact distal pulses.   No murmur heard. Pulmonary/Chest: Effort normal and breath sounds normal. No respiratory distress. She has no wheezes.    Abdominal: Soft. Bowel sounds are normal. She exhibits no distension. There is no tenderness.  Musculoskeletal: Normal range of motion. She exhibits no edema or tenderness.  Neurological: She is alert and oriented to person, place, and time. She has normal reflexes. No cranial nerve deficit.  Skin: Skin is warm and dry.  Psychiatric: She has a normal mood and affect. Her behavior is normal. Judgment and thought content normal.  Vitals reviewed.     BP 129/62 mmHg  Pulse 72  Temp(Src) 97.3 F (36.3 C) (Oral)  Ht 5' 4.8" (1.646 m)  Wt 196 lb 3.2 oz (88.996 kg)  BMI 32.85 kg/m2     Assessment & Plan:  1. Otitis, externa, infective, right -Keep clean and dry -Do not stick anything into ear -Avoid getting water into ear -RTO prn  - ciprofloxacin-dexamethasone (CIPRODEX) otic suspension; Place 4 drops into the right ear 2 (two) times daily.  Dispense: 7.5 mL; Refill: 0  Evelina Dun, FNP

## 2016-04-15 NOTE — Patient Instructions (Signed)

## 2016-05-17 ENCOUNTER — Other Ambulatory Visit: Payer: Self-pay

## 2016-05-17 DIAGNOSIS — B372 Candidiasis of skin and nail: Secondary | ICD-10-CM

## 2016-05-17 DIAGNOSIS — N760 Acute vaginitis: Secondary | ICD-10-CM

## 2016-05-17 MED ORDER — NYSTATIN-TRIAMCINOLONE 100000-0.1 UNIT/GM-% EX OINT: 1.0000 "application " | TOPICAL_OINTMENT | Freq: Two times a day (BID) | CUTANEOUS | 2 refills | Status: DC

## 2016-05-18 ENCOUNTER — Ambulatory Visit: Payer: Medicare Other | Admitting: Family

## 2016-05-19 ENCOUNTER — Encounter: Payer: Self-pay | Admitting: Pediatrics

## 2016-05-19 ENCOUNTER — Ambulatory Visit (INDEPENDENT_AMBULATORY_CARE_PROVIDER_SITE_OTHER): Payer: Medicare Other | Admitting: Family

## 2016-05-19 ENCOUNTER — Encounter: Payer: Self-pay | Admitting: Family

## 2016-05-19 VITALS — BP 126/66 | HR 71 | Temp 97.8°F | Ht 64.8 in | Wt 194.4 lb

## 2016-05-19 DIAGNOSIS — J309 Allergic rhinitis, unspecified: Secondary | ICD-10-CM

## 2016-05-19 DIAGNOSIS — H938X1 Other specified disorders of right ear: Secondary | ICD-10-CM | POA: Diagnosis not present

## 2016-05-19 MED ORDER — MOMETASONE FUROATE 50 MCG/ACT NA SUSP: 2.0000 | Freq: Every day | NASAL | 12 refills | Status: DC

## 2016-05-19 NOTE — Progress Notes (Signed)
   Subjective:    Patient ID: [name], female    DOB: [birth year], 75 y.o.   MRN: [id number]  Ear Fullness   There is pain in the right ear. This is a recurrent problem. The current episode started more than 1 month ago. The problem occurs constantly. The problem has been waxing and waning. There has been no fever. The pain is at a severity of 0/10. The patient is experiencing no pain. Associated symptoms include hearing loss. Pertinent negatives include no ear discharge, rhinorrhea, sore throat or vomiting. She has tried antibiotics for the symptoms. The treatment provided mild relief. Her past medical history is significant for hearing loss.      Review of Systems  HENT: Positive for hearing loss. Negative for ear discharge, rhinorrhea and sore throat.   Gastrointestinal: Negative for vomiting.  All other systems reviewed and are negative.      Objective:   Physical Exam  Constitutional: She is oriented to person, place, and time. She appears well-developed and well-nourished. No distress.  HENT:  Head: Normocephalic and atraumatic.  Right Ear: External ear normal.  Nose: Mucosal edema and rhinorrhea present. Right sinus exhibits no maxillary sinus tenderness and no frontal sinus tenderness. Left sinus exhibits no maxillary sinus tenderness and no frontal sinus tenderness.  Mouth/Throat: Oropharynx is clear and moist.  Eyes: Pupils are equal, round, and reactive to light.  Neck: Normal range of motion. Neck supple. No thyromegaly present.  Cardiovascular: Normal rate, regular rhythm, normal heart sounds and intact distal pulses.   No murmur heard. Pulmonary/Chest: Effort normal and breath sounds normal. No respiratory distress. She has no wheezes.  Abdominal: Soft. Bowel sounds are normal. She exhibits no distension. There is no tenderness.  Musculoskeletal: Normal range of motion. She exhibits no edema or tenderness.  Neurological: She is alert and oriented to person, place,  and time. She has normal reflexes. No cranial nerve deficit.  Skin: Skin is warm and dry.  Psychiatric: She has a normal mood and affect. Her behavior is normal. Judgment and thought content normal.  Vitals reviewed.     BP 126/66   Pulse 71   Temp 97.8 F (36.6 C) (Oral)   Ht 5' 4.8" (1.646 m)   Wt 194 lb 6.4 oz (88.2 kg)   BMI 32.55 kg/m      Assessment & Plan:  1. Allergic rhinitis, unspecified allergic rhinitis type -Avoid allergens -PT to start daily zyrtec  - mometasone (NASONEX) 50 MCG/ACT nasal spray; Place 2 sprays into the nose daily.  Dispense: 17 g; Refill: 12  2. Ear fullness, right -Start nasonex Avoid allergens IF does not improve will send to ENT RTO prn - mometasone (NASONEX) 50 MCG/ACT nasal spray; Place 2 sprays into the nose daily.  Dispense: 17 g; Refill: [place], FNP

## 2016-05-19 NOTE — Patient Instructions (Signed)
Allergic Rhinitis Allergic rhinitis is when the mucous membranes in the nose respond to allergens. Allergens are particles in the air that cause your body to have an allergic reaction. This causes you to release allergic antibodies. Through a chain of events, these eventually cause you to release histamine into the blood stream. Although meant to protect the body, it is this release of histamine that causes your discomfort, such as frequent sneezing, congestion, and an itchy, runny nose.  CAUSES Seasonal allergic rhinitis (hay fever) is caused by pollen allergens that may come from grasses, trees, and weeds. Year-round allergic rhinitis (perennial allergic rhinitis) is caused by allergens such as house dust mites, pet dander, and mold spores. SYMPTOMS  Nasal stuffiness (congestion).  Itchy, runny nose with sneezing and tearing of the eyes. DIAGNOSIS Your health care provider can help you determine the allergen or allergens that trigger your symptoms. If you and your health care provider are unable to determine the allergen, skin or blood testing may be used. Your health care provider will diagnose your condition after taking your health history and performing a physical exam. Your health care provider may assess you for other related conditions, such as asthma, pink eye, or an ear infection. TREATMENT Allergic rhinitis does not have a cure, but it can be controlled by:  Medicines that block allergy symptoms. These may include allergy shots, nasal sprays, and oral antihistamines.  Avoiding the allergen. Hay fever may often be treated with antihistamines in pill or nasal spray forms. Antihistamines block the effects of histamine. There are over-the-counter medicines that may help with nasal congestion and swelling around the eyes. Check with your health care provider before taking or giving this medicine. If avoiding the allergen or the medicine prescribed do not work, there are many new medicines  your health care provider can prescribe. Stronger medicine may be used if initial measures are ineffective. Desensitizing injections can be used if medicine and avoidance does not work. Desensitization is when a patient is given ongoing shots until the body becomes less sensitive to the allergen. Make sure you follow up with your health care provider if problems continue. HOME CARE INSTRUCTIONS It is not possible to completely avoid allergens, but you can reduce your symptoms by taking steps to limit your exposure to them. It helps to know exactly what you are allergic to so that you can avoid your specific triggers. SEEK MEDICAL CARE IF:  You have a fever.  You develop a cough that does not stop easily (persistent).  You have shortness of breath.  You start wheezing.  Symptoms interfere with normal daily activities.   This information is not intended to replace advice given to you by your health care provider. Make sure you discuss any questions you have with your health care provider.   Document Released: 07/06/2001 Document Revised: 11/01/2014 Document Reviewed: 06/18/2013 Elsevier Interactive Patient Education 2016 [employer] Inc.  

## 2016-05-20 DIAGNOSIS — M722 Plantar fascial fibromatosis: Secondary | ICD-10-CM | POA: Diagnosis not present

## 2016-06-04 ENCOUNTER — Encounter: Payer: Self-pay | Admitting: Pediatrics

## 2016-06-04 ENCOUNTER — Ambulatory Visit (INDEPENDENT_AMBULATORY_CARE_PROVIDER_SITE_OTHER): Payer: Medicare Other | Admitting: Pediatrics

## 2016-06-04 VITALS — BP 139/61 | HR 66 | Temp 97.1°F | Ht 64.8 in | Wt 197.8 lb

## 2016-06-04 DIAGNOSIS — Z794 Long term (current) use of insulin: Secondary | ICD-10-CM | POA: Diagnosis not present

## 2016-06-04 DIAGNOSIS — N6489 Other specified disorders of breast: Secondary | ICD-10-CM

## 2016-06-04 DIAGNOSIS — H938X1 Other specified disorders of right ear: Secondary | ICD-10-CM | POA: Diagnosis not present

## 2016-06-04 DIAGNOSIS — I10 Essential (primary) hypertension: Secondary | ICD-10-CM | POA: Diagnosis not present

## 2016-06-04 DIAGNOSIS — N63 Unspecified lump in unspecified breast: Secondary | ICD-10-CM

## 2016-06-04 DIAGNOSIS — E119 Type 2 diabetes mellitus without complications: Secondary | ICD-10-CM | POA: Diagnosis not present

## 2016-06-04 HISTORY — DX: Other specified disorders of breast: N64.89

## 2016-06-04 NOTE — Progress Notes (Signed)
    Subjective:    Patient ID: [name], female    DOB: Jun 26, 1941, 75 y.o.   MRN: [id number]  CC: Follow-up (Ear Infection- right ear)   HPI: [name] is a 75 y.o. female presenting for Follow-up (Ear Infection- right ear)  Has been on nasal steroid x 6 weeks, allergy medicine x 6 weeks Has gotten course of PO antibiotics of OM Was using ear drops for OE  Ear pain improved Continues to feel ear fullness R side Ear not popping but does think she cant hear as well out of R ear recently No dizziness No fevers No sore throat Feeling well otherwise Ear fullness does bother her  Recent lumpectomy Has follow up with medical oncology coming up, has not yet seen them   Relevant past medical, surgical, family and social history reviewed and updated. Interim medical history since our last visit reviewed. Allergies and medications reviewed and updated.  History  Smoking Status  . Never Smoker  Smokeless Tobacco  . Never Used    ROS: Per HPI      Objective:    BP 139/61 (BP Location: Right Arm, Patient Position: Sitting, Cuff Size: Large)   Pulse 66   Temp 97.1 F (36.2 C) (Oral)   Ht 5' 4.8" (1.646 m)   Wt 197 lb 12.8 oz (89.7 kg)   BMI 33.12 kg/m   Wt Readings from Last 3 Encounters:  06/04/16 197 lb 12.8 oz (89.7 kg)  05/19/16 194 lb 6.4 oz (88.2 kg)  04/15/16 196 lb 3.2 oz (89 kg)     Gen: NAD, alert, cooperative with exam, NCAT EYES: EOMI, no scleral injection or icterus ENT:  R TM with some clear fluid behing membrane, pearly gray, Normal L TM, OP without erythema LYMPH: no cervical LAD CV: NRRR, normal S1/S2, no murmur, distal pulses 2+ b/l Resp: CTABL, no wheezes, normal WOB Abd: +BS, soft, NTND. no guarding or organomegaly Ext: No edema, warm Neuro: Alert and oriented     Assessment & Plan:    [name] was seen today for follow-up mulitple med problems.  Diagnoses and all orders for this visit:  Ear fullness, right Ongoing, no improvement  despite 6 weeks of nasal steroids and allegra -     Ambulatory referral to ENT  Essential hypertension Well controlled today  Type 2 diabetes mellitus with insulin therapy ([place]) Foot exam today, decreased sensation to monofilament R foot compared to L Well controlled  Breast mass appt with medical oncology coming up   Follow up plan: Return in about 3 months (around 09/04/2016).  Assunta Found, MD [facility] 06/04/2016, 3:16 PM

## 2016-06-07 ENCOUNTER — Ambulatory Visit (HOSPITAL_COMMUNITY): Payer: Medicare Other | Admitting: Oncology

## 2016-06-09 DIAGNOSIS — N3946 Mixed incontinence: Secondary | ICD-10-CM | POA: Diagnosis not present

## 2016-06-18 ENCOUNTER — Encounter: Payer: Self-pay | Admitting: Family Medicine

## 2016-06-18 ENCOUNTER — Ambulatory Visit (INDEPENDENT_AMBULATORY_CARE_PROVIDER_SITE_OTHER): Payer: Medicare Other | Admitting: Family Medicine

## 2016-06-18 VITALS — BP 110/54 | HR 67 | Temp 97.5°F | Ht 64.8 in | Wt 194.0 lb

## 2016-06-18 DIAGNOSIS — F411 Generalized anxiety disorder: Secondary | ICD-10-CM | POA: Diagnosis not present

## 2016-06-18 DIAGNOSIS — E1169 Type 2 diabetes mellitus with other specified complication: Secondary | ICD-10-CM | POA: Diagnosis not present

## 2016-06-18 DIAGNOSIS — Z794 Long term (current) use of insulin: Secondary | ICD-10-CM

## 2016-06-18 DIAGNOSIS — M545 Low back pain, unspecified: Secondary | ICD-10-CM

## 2016-06-18 DIAGNOSIS — E119 Type 2 diabetes mellitus without complications: Secondary | ICD-10-CM | POA: Diagnosis not present

## 2016-06-18 DIAGNOSIS — R350 Frequency of micturition: Secondary | ICD-10-CM | POA: Diagnosis not present

## 2016-06-18 DIAGNOSIS — E785 Hyperlipidemia, unspecified: Secondary | ICD-10-CM | POA: Diagnosis not present

## 2016-06-18 DIAGNOSIS — E1140 Type 2 diabetes mellitus with diabetic neuropathy, unspecified: Secondary | ICD-10-CM | POA: Insufficient documentation

## 2016-06-18 DIAGNOSIS — E1142 Type 2 diabetes mellitus with diabetic polyneuropathy: Secondary | ICD-10-CM

## 2016-06-18 DIAGNOSIS — N3281 Overactive bladder: Secondary | ICD-10-CM | POA: Insufficient documentation

## 2016-06-18 LAB — BAYER DCA HB A1C WAIVED: HB A1C: 7 % — AB (ref <7.0)

## 2016-06-18 MED ORDER — DICLOFENAC SODIUM 1 % TD GEL: 4.0000 g | Freq: Four times a day (QID) | TRANSDERMAL | 3 refills | Status: DC

## 2016-06-18 MED ORDER — DULOXETINE HCL 60 MG PO CPEP: 60.0000 mg | ORAL_CAPSULE | Freq: Every day | ORAL | 0 refills | Status: DC

## 2016-06-18 MED ORDER — DULOXETINE HCL 60 MG PO CPEP: 60.0000 mg | ORAL_CAPSULE | Freq: Every day | ORAL | 5 refills | Status: DC

## 2016-06-18 NOTE — Progress Notes (Signed)
   HPI  Patient presents today here with several complaints and follow-up for chronic medical conditions.  Type 2 diabetes Average fasting blood sugar 11/14/1949 Reports very good compliance Takes Lantus 34 units daily, NovoLog per sliding scale No hypoglycemia. Also reports toe tingling bilaterally  Anxiety/depression Denies suicidal thoughts, reports worsening symptoms on Lexapro only Previously was on Lexapro and Cymbalta, she's been discontinued off of Cymbalta. She would like to change to Cymbalta only.  Urinary frequency Has an appointment for instrumentation of the bladder, she needs a urine culture ordered for a future visit to rule out infection.  Left-sided low back pain Reports one month of intermittent left-sided upper lumbar/lower thoracic paraspinal muscle pain, worse with certain positions Helped by Voltaren gel.   PMH: Smoking status noted ROS: Per HPI  Objective: BP (!) 110/54   Pulse 67   Temp 97.5 F (36.4 C) (Oral)   Ht 5' 4.8" (1.646 m)   Wt 194 lb (88 kg)   BMI 32.48 kg/m  Gen: NAD, alert, cooperative with exam HEENT: NCAT CV: RRR, good S1/S2, no murmur Resp: CTABL, no wheezes, non-labored Ext: No edema, warm Neuro: Alert and oriented, No gross deficits  Diabetic Foot Exam - Simple   Simple Foot Form Visual Inspection No deformities, no ulcerations, no other skin breakdown bilaterally:  Yes Sensation Testing Intact to touch and monofilament testing bilaterally:  Yes Pulse Check Posterior Tibialis and Dorsalis pulse intact bilaterally:  Yes Comments     Assessment and plan:  # Type 2 diabetes Control reasonable, A1c 7.0 Foot exam as above With neuropathy Continue current medications  # Diabetic neuropathy Continue to monitor, mild symptoms, normal exam  # Generalized anxiety disorder, depression Changing from Lexapro to Cymbalta. Recommended considering addition of Wellbutrin versus Abilify if she has uncontrolled symptoms in 2  months after this change.  # Sided low back pain without sciatica Changing to Cymbalta Given Voltaren gel No red flags.  Urinary frequency Patient has history of urinary frequency and no corrective bladder She has instrumentation planned and needs a urine culture prior to this, urine culture order placed.    Orders Placed This Encounter  Procedures  . Urine culture    Standing Status:   Future    Standing Expiration Date:   06/18/2017  . Bayer DCA Hb A1c Waived    Meds ordered this encounter  Medications  . DISCONTD: DULoxetine (CYMBALTA) 60 MG capsule    Sig: Take 1 capsule (60 mg total) by mouth daily.    Dispense:  30 capsule    Refill:  0  . diclofenac sodium (VOLTAREN) 1 % GEL    Sig: Apply 4 g topically 4 (four) times daily.    Dispense:  100 g    Refill:  3  . DULoxetine (CYMBALTA) 60 MG capsule    Sig: Take 1 capsule (60 mg total) by mouth daily.    Dispense:  30 capsule    Refill:  [place], MD [facility] 06/18/2016, 12:19 PM

## 2016-06-18 NOTE — Patient Instructions (Signed)
   Stop lexapro and start cymbalta, come back in  1 month to follow up for mood

## 2016-07-02 ENCOUNTER — Encounter: Payer: Self-pay | Admitting: Family Medicine

## 2016-07-02 ENCOUNTER — Ambulatory Visit (INDEPENDENT_AMBULATORY_CARE_PROVIDER_SITE_OTHER): Payer: Medicare Other

## 2016-07-02 ENCOUNTER — Ambulatory Visit (INDEPENDENT_AMBULATORY_CARE_PROVIDER_SITE_OTHER): Payer: Medicare Other | Admitting: Family Medicine

## 2016-07-02 VITALS — BP 115/63 | HR 74 | Temp 97.0°F | Ht 64.8 in | Wt 194.2 lb

## 2016-07-02 DIAGNOSIS — M797 Fibromyalgia: Secondary | ICD-10-CM

## 2016-07-02 DIAGNOSIS — M546 Pain in thoracic spine: Secondary | ICD-10-CM

## 2016-07-02 DIAGNOSIS — M25512 Pain in left shoulder: Secondary | ICD-10-CM | POA: Diagnosis not present

## 2016-07-02 DIAGNOSIS — K219 Gastro-esophageal reflux disease without esophagitis: Secondary | ICD-10-CM

## 2016-07-02 MED ORDER — TRAZODONE HCL 50 MG PO TABS: 25.0000 mg | ORAL_TABLET | Freq: Every day | ORAL | 0 refills | Status: DC

## 2016-07-02 MED ORDER — VENLAFAXINE HCL ER 37.5 MG PO CP24: 37.5000 mg | ORAL_CAPSULE | Freq: Every day | ORAL | 3 refills | Status: DC

## 2016-07-02 MED ORDER — ESOMEPRAZOLE MAGNESIUM 20 MG PO CPDR: 20.0000 mg | DELAYED_RELEASE_CAPSULE | Freq: Every day | ORAL | 3 refills | Status: DC

## 2016-07-02 MED ORDER — TRAZODONE HCL 50 MG PO TABS: 25.0000 mg | ORAL_TABLET | Freq: Every day | ORAL | 3 refills | Status: DC

## 2016-07-02 NOTE — Patient Instructions (Addendum)
Great to see you!  Stop Cymbalta, start Effexor 1 pill once a day  We wiill call with x ray results  Try tylenol and/or aspercreme for pain.   Come back to see Dr. Evette Doffing in 3-4 weeks for follow up fibromyalgia

## 2016-07-02 NOTE — Progress Notes (Signed)
HPI  Patient presents today here for follow-up of fibromyalgia and with left-sided pain and left shoulder pain.  Left-sided back pain Described as left-sided paraspinal muscle thoracic back pain that radiates around to her left flank, worse with deep inspiration, cough, or twisting movement. Worse than last visit   Shoulder pain Single and on for about the same time No exertional symptoms for either Shortness of breath, fever, palpitations, or injury. Describes primarily posterior left shoulder pain worse with movement. Has been told previously she had bone spurs and arthritis in her shoulder  GERD Symptoms stable with Nexium, and request one year refill.  Trazodone-needs refill as well.  Fibromyalgia Started on Cymbalta last visit, patient states that she had nightmares, chills, weakness- she attributes this to the medication. Denies any suicidal thoughts  PMH: Smoking status noted ROS: Per HPI  Objective: BP 115/63 (BP Location: Right Arm, Patient Position: Sitting, Cuff Size: Normal)   Pulse 74   Temp 97 F (36.1 C) (Oral)   Ht 5' 4.8" (1.646 m)   Wt 194 lb 3.2 oz (88.1 kg)   BMI 32.52 kg/m  Gen: NAD, alert, cooperative with exam HEENT: NCAT CV: RRR, good S1/S2, no murmur Resp: CTABL, no wheezes, non-labored Ext: No edema, warm Neuro: Alert and oriented, No gross deficits  MSK  mild tenderness with Hawkins and Neer sign, negative empty can test Mild tenderness to palpation of anterior shoulder over the bicipital groove and posterior deltoid  Left-sided tenderness to palpation of the paraspinal muscles in the thoracic area, no midline tenderness   Assessment and plan:  # Fibromyalgia Did not tolerate Cymbalta, after our discussion I realized that she had been off of Cymbalta longer than I thought she had. I would have preferred that we started at a low dose and slowly titrated up, this is likely why she did not tolerate it. Trial of Effexor, started low  dose, 37.5 mg daily. Increase at follow-up, recommended 3-4 week PCP follow-up or with myself. Likely this is the underlying cause for her back and shoulder pain.  # Thoracic back pain Description fits pleurisy, however she also has tenderness to palpation of the left-sided paraspinal muscles No injury Recommended restart Tylenol, trial of Aspercreme Hopefully Effexor will help Chest x-ray pending to rule out effusion  # Left shoulder pain Most likely a Fibromyalgia is the source, however she does have some mild symptoms consistent with rotator cuff syndrome. Tylenol  We previously stopped Tylenol for medication overuse headaches, they have resolved. I explained cautious intermittent use is probably okay.  GERD Stable, refilled Nexium.  Refill trazodone as well- month local pharmacy, 1 year to the [place] via paper prescription  Orders Placed This Encounter  Procedures  . DG Chest 2 View    Standing Status:   Future    Standing Expiration Date:   09/01/2017    Order Specific Question:   Reason for Exam (SYMPTOM  OR DIAGNOSIS REQUIRED)    Answer:   thoracic back pain similar to pleurisy in description    Order Specific Question:   Preferred imaging location?    Answer:   External    Meds ordered this encounter  Medications  . DISCONTD: traZODone (DESYREL) 50 MG tablet    Sig: Take 0.5-1 tablets (25-50 mg total) by mouth at bedtime.    Dispense:  30 tablet    Refill:  0  . traZODone (DESYREL) 50 MG tablet    Sig: Take 0.5-1 tablets (25-50 mg total) by mouth  at bedtime.    Dispense:  90 tablet    Refill:  3  . esomeprazole (NEXIUM) 20 MG capsule    Sig: Take 1 capsule (20 mg total) by mouth daily at 12 noon.    Dispense:  90 capsule    Refill:  3  . venlafaxine XR (EFFEXOR XR) 37.5 MG 24 hr capsule    Sig: Take 1 capsule (37.5 mg total) by mouth daily with breakfast.    Dispense:  30 capsule    Refill:  [place], MD [place] 07/02/2016,  11:20 AM

## 2016-07-07 ENCOUNTER — Encounter (HOSPITAL_COMMUNITY): Payer: Medicare Other | Attending: Oncology | Admitting: Oncology

## 2016-07-07 ENCOUNTER — Encounter (HOSPITAL_COMMUNITY): Payer: Self-pay | Admitting: Oncology

## 2016-07-07 DIAGNOSIS — E119 Type 2 diabetes mellitus without complications: Secondary | ICD-10-CM

## 2016-07-07 DIAGNOSIS — Z8042 Family history of malignant neoplasm of prostate: Secondary | ICD-10-CM | POA: Diagnosis not present

## 2016-07-07 DIAGNOSIS — N6489 Other specified disorders of breast: Secondary | ICD-10-CM | POA: Diagnosis present

## 2016-07-07 DIAGNOSIS — I10 Essential (primary) hypertension: Secondary | ICD-10-CM

## 2016-07-07 NOTE — Patient Instructions (Addendum)
[place] at [facility] Discharge Instructions  RECOMMENDATIONS MADE BY THE CONSULTANT AND ANY TEST RESULTS WILL BE SENT TO YOUR REFERRING PHYSICIAN.  You were seen by Gershon Mussel today. Follow up in 1 year. Mammogram due in Febuary Please call the center with any related concerns   Thank you for choosing [place] at [facility] to provide your oncology and hematology care.  To afford each patient quality time with our provider, please arrive at least 15 minutes before your scheduled appointment time.   Beginning January 23rd 2017 lab work for the [facility] will be done in the  Main lab at [facility] on 1st floor. If you have a lab appointment with the [facility] please come in thru the  Main Entrance and check in at the main information desk  You need to re-schedule your appointment should you arrive 10 or more minutes late.  We strive to give you quality time with our providers, and arriving late affects you and other patients whose appointments are after yours.  Also, if you no show three or more times for appointments you may be dismissed from the clinic at the providers discretion.     Again, thank you for choosing [facility].  Our hope is that these requests will decrease the amount of time that you wait before being seen by our physicians.       _____________________________________________________________  Should you have questions after your visit to [facility], please contact our office at [phone] between the hours of 8:30 a.m. and 4:30 p.m.  Voicemails left after 4:30 p.m. will not be returned until the following business day.  For prescription refill requests, have your pharmacy contact our office.         Resources For Cancer Patients and their Caregivers ? American Cancer Society: Can assist with transportation, wigs, general needs, runs Look Good Feel Better.         6476334260 ? Cancer Care: Provides financial assistance, online support groups, medication/co-pay assistance.  1-800-813-HOPE 321-274-7934) ? [facility] [address] Assists [place] cancer patients and their families through emotional , educational and financial support.  229-376-1631 ? [place] DSS Where to apply for food stamps, Medicaid and utility assistance. 980-735-7310 ? [facility]: Transportation to medical appointments. 308-830-8688 ? Social Security Administration: May apply for disability if have a Stage IV cancer. (762) 334-3437 9730428972 ? [employer], Disability and Transit Services: Assists with nutrition, care and transit needs. [place] Support Programs: @10RELATIVEDAYS @ > Cancer Support Group  2nd Tuesday of the month 1pm-2pm, Journey Room  > Creative Journey  3rd Tuesday of the month 1130am-1pm, Journey Room  > Look Good Feel Better  1st Wednesday of the month 10am-12 noon, Journey Room (Call [facility] to register [phone])

## 2016-07-07 NOTE — Assessment & Plan Note (Addendum)
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 Marlou Starks III on 01/22/2016 again confirming aforementioned diagnosis without any identified malignancy. Of note, she has chronic left nipple inversion times many years.  Chart is reviewed.   I personally reviewed and went over radiographic studies with the patient.  The results are noted within this dictation.    I personally reviewed and went over pathology results with the patient.  She is provided education regarding complex sclerosing lesion/radial scar.  Radial scars, also called complex sclerosing lesions, are a pathologic diagnosis, usually discovered incidentally when a breast mass or radiologic abnormality is removed or biopsied. Occasionally, radial scars are large enough to be detected by mammography, which cannot reliably differentiate between these lesions and spiculated carcinoma. Radial scars are characterized microscopically by a fibroelastic core with radiating ducts and lobules.  There is ongoing controversy about the need for surgical excision when radial scars are found on core biopsy.  We suggest that these be excised since most series show that 8 to 17 percent of surgical specimens at subsequent excision are positive for malignancy. In addition to the possibility of finding an unrecognized in situ or invasive component, there is some evidence that radial scars may be premalignant lesions, meaning that they can slowly progress from scar to hyperplasia to carcinoma over time.  No additional treatment beyond excision is needed for radial scars. The risk of subsequent breast cancer after excision in this population is small, and chemoprevention is not indicated.  Surveillance includes annual mammogram in addition to annual breast exam.  Mammogram is due in February 2018.  No role for labs from  an oncology perspective.  Return in 1 year for follow-up.

## 2016-07-07 NOTE — Progress Notes (Signed)
[facility] Hematology/Oncology Consultation   Name: [name]      MRN: [id number]    Date: 07/07/2016 Time:10:38 PM   REFERRING PHYSICIAN:  Autumn Messing, MD (Gen Surg)  REASON FOR CONSULT:  High risk breast   DIAGNOSIS:  Complex sclerosing lesion/radial scar of left breast  HISTORY OF PRESENT ILLNESS:   [name] is a 75 y.o. female with a medical history significant for dyslipidemia, general anxiety disorder, hypertension, mitral valve prolapse, uro-bowel syndrome, fibromyalgia, GERD, diabetes type 2 with insulin therapy, insomnia, obesity, diabetic retinopathy, diabetic neuropathy who is referred to the [facility] for complex sclerosing lesion/radial scar of left breast.  The patient reports that she underwent annual mammogram in January/February 2017. She notes that she is typically on time for her annual screening mammogram ordered by her gynecologist, Dr. Paula Compton, but notes that this one was approximately 6 months late. She reports that this mammogram was abnormal and therefore she underwent diagnostic mammogram with ultrasound of left breast. Mammogram on 12/03/2015 demonstrates a persistent area of distortion in the left upper outer quadrant of breast measuring 1.4 x 1.1 x 0.5 cm and a hypoechoic mass in the left breast at the 2:00 position 4 cm from the nipple measuring 565 mm.  This was followed by a biopsy on 12/10/2015 demonstrating a complex sclerosing lesion/radial scar showing fibrocystic changes and usual ductal hyperplasia. Additionally, there was associated calcifications. Subsequently, on 01/22/2016, she underwent a left breast lumpectomy with seed placement. Pathology demonstrated a complex sclerosing lesion/radial scar showing fibrocystic changes and usual ductal hyperplasia without any indication of malignancy   She reports that prior to her mammogram in January/February 2017 she could not palpate any left breast  abnormality.  Today, she denies any complaints.  Review of Systems  Constitutional: Negative.  Negative for chills, fever, malaise/fatigue and weight loss.  HENT: Negative.  Negative for congestion and sore throat.   Eyes: Negative.  Negative for double vision.  Respiratory: Negative.  Negative for cough, hemoptysis, sputum production, shortness of breath and wheezing.   Cardiovascular: Negative.  Negative for chest pain.  Gastrointestinal: Negative.  Negative for blood in stool, constipation, diarrhea, melena, nausea and vomiting.  Genitourinary: Negative.  Negative for dysuria.  Musculoskeletal: Negative.  Negative for falls.  Skin: Negative.   Neurological: Negative.  Negative for weakness and headaches.  Endo/Heme/Allergies: Negative.   Psychiatric/Behavioral: Negative.   14 point review of systems was performed and is negative except as detailed under history of present illness and above   PAST MEDICAL HISTORY:   Past Medical History:  Diagnosis Date  . Acute cystitis   . Depression   . Diabetes mellitus type II   . Diabetic retinopathy   . DM type 2 causing CKD stage 3 ([place])   . Fibromyalgia   . GERD (gastroesophageal reflux disease)   . Hyperlipidemia   . Hypertension   . IBS (irritable bowel syndrome)   . IBS (irritable bowel syndrome)   . Insomnia   . Lichen planus    vulvar  . Osteoporosis   . Pancreatitis, gallstone   . Postmenopausal   . Radial scar of breast 06/04/2016  . Urge incontinence of urine     ALLERGIES: Allergies  Allergen Reactions  . Latex Anaphylaxis and Rash  . Ambien [Zolpidem]   . Amoxicillin   . Augmentin [Amoxicillin-Pot Clavulanate]   . Belsomra [Suvorexant]   . Celebrex [Celecoxib]   . Fluticasone   .  Hydrocodone   . Hydrocodone-Acetaminophen Other (See Comments)    hallucinations  . Hyoscyamine Sulfate   . Melatonin   . Mobic [Meloxicam]   . Morphine     REACTION: hallucinations,GI upset  . Other Nausea And Vomiting     balsomra  . Oxycodone   . Tramadol   . Vicoprofen [Hydrocodone-Ibuprofen]   . Zolpidem Tartrate     REACTION: hallucinations  . Sulfa Antibiotics Rash      MEDICATIONS: I have reviewed the patient's current medications.    Current Outpatient Prescriptions on File Prior to Visit  Medication Sig Dispense Refill  . aspirin 81 MG tablet Take 81 mg by mouth daily.      Marland Kitchen atorvastatin (LIPITOR) 20 MG tablet Take 0.5 tablets (10 mg total) by mouth at bedtime. 45 tablet 3  . Calcium Carb-Cholecalciferol (CALCIUM 600+D3) 600-200 MG-UNIT TABS Take 1 tablet by mouth 2 (two) times daily.      Marland Kitchen desonide (DESOWEN) 0.05 % ointment Apply 1 application topically 2 (two) times daily.     . diclofenac sodium (VOLTAREN) 1 % GEL Apply 4 g topically 4 (four) times daily. 100 g 3  . Dulaglutide (TRULICITY) 7.65 YY/5.0PT SOPN Inject 0.75 mg into the skin once a week. 12 pen 1  . esomeprazole (NEXIUM) 20 MG capsule Take 1 capsule (20 mg total) by mouth daily at 12 noon. 90 capsule 3  . estradiol (ESTRACE) 0.1 MG/GM vaginal cream Place 2 g vaginally every Monday, Wednesday, and Friday.     Marland Kitchen glucose blood (FREESTYLE LITE) test strip Use qid to check blood sugar. Dx E11.9 200 each 11  . insulin aspart (NOVOLOG FLEXPEN) 100 UNIT/ML FlexPen Inject prior to each meal per sliding scale.BG less than 80 - no insulin; 80 to 120 - 3 units; 121 to 150 - 5 units; 151 to 200 - 7 units; 201 to 250 - 8 units; 250 or above - 9 units and call office. 30 mL 3  . Insulin Glargine (LANTUS SOLOSTAR) 100 UNIT/ML Solostar Pen Inject 38 Units into the skin every morning. Please note increase in dose 45 mL 0  . Insulin Pen Needle (NOVOFINE) 30G X 8 MM MISC Use to inject insulin up to qid. 400 each 3  . losartan (COZAAR) 25 MG tablet Take 1 tablet (25 mg total) by mouth daily. 90 tablet 3  . metoprolol succinate (TOPROL-XL) 25 MG 24 hr tablet Take 0.5 tablets (12.5 mg total) by mouth daily. 45 tablet 3  . mirabegron ER (MYRBETRIQ) 50 MG  TB24 tablet Take 50 mg by mouth daily.    . mometasone (NASONEX) 50 MCG/ACT nasal spray Place 2 sprays into the nose daily. 17 g 12  . Multiple Vitamin (MULTIVITAMIN) tablet Take 0.5 tablets by mouth 2 (two) times daily.     Marland Kitchen nystatin (NYSTATIN) powder Apply under bilateral breasts and groin BID 60 g 1  . nystatin-triamcinolone ointment (MYCOLOG) Apply 1 application topically 2 (two) times daily. 30 g 2  . Polyethyl Glycol-Propyl Glycol 0.4-0.3 % SOLN Apply to eye.    . Probiotic Product (PROBIOTIC ADVANCED PO) Take by mouth.    . traZODone (DESYREL) 50 MG tablet Take 0.5-1 tablets (25-50 mg total) by mouth at bedtime. 90 tablet 3  . triamcinolone (KENALOG) 0.025 % ointment Apply 1 application topically 2 (two) times daily. 30 g 0  . venlafaxine XR (EFFEXOR XR) 37.5 MG 24 hr capsule Take 1 capsule (37.5 mg total) by mouth daily with breakfast. 30 capsule 3  No current facility-administered medications on file prior to visit.      PAST SURGICAL HISTORY Past Surgical History:  Procedure Laterality Date  . ANKLE SURGERY  02/2004  . BREAST LUMPECTOMY WITH RADIOACTIVE SEED LOCALIZATION Left 01/22/2016   Procedure: LEFT BREAST LUMPECTOMY WITH RADIOACTIVE SEED LOCALIZATION;  Surgeon: Autumn Messing III, MD;  Location: [place];  Service: General;  Laterality: Left;  . CATARACT EXTRACTION W/ INTRAOCULAR LENS IMPLANT  09/2003, 12/2003  . CHOLECYSTECTOMY  2004  . EYE SURGERY     laser treatments  . JOINT REPLACEMENT    . TOTAL KNEE ARTHROPLASTY     Left  . TRIGGER FINGER RELEASE Left 01/30/2002  . TUBAL LIGATION  1970    FAMILY HISTORY: Family History  Problem Relation Age of Onset  . Diabetes Mother   . Hypertension Mother   . Kidney disease Mother   . Heart disease Mother   . Peripheral vascular disease Mother     amputation  . Cancer Father     prostate  . Diabetes Father   . Heart disease Father     CABG  . Hypertension Father   . Heart attack Father   .  Depression Sister   . Hypertension Sister   Her husband is deceased 30 years secondary to prostate cancer. 3 children. Daughter lives in [place], [place]. Son lives in [place], [place]. She has another son who lives locally.  SOCIAL HISTORY:  reports that she has never smoked. She has never used smokeless tobacco. She reports that she does not drink alcohol or use drugs.  She is widowed 8 years. Her husband is deceased secondary to prostate cancer. She does have 3 children. She is Leslie and religion. He used to work as a bookkeeper/accounting.  Social History   Social History  . Marital status: Widowed    Spouse name: N/A  . Number of children: N/A  . Years of education: N/A   Occupational History  . retired Retired   Social History Main Topics  . Smoking status: Never Smoker  . Smokeless tobacco: Never Used  . Alcohol use No  . Drug use: No  . Sexual activity: No   Other Topics Concern  . None   Social History Narrative   Widowed in 2010    PERFORMANCE STATUS: The patient's performance status is 0 - Asymptomatic  PHYSICAL EXAM: Most Recent Vital Signs: Blood pressure (!) 143/59, pulse 85, temperature 98.3 F (36.8 C), temperature source Oral, resp. rate 18, height 5' 4"  (1.626 m), weight 197 lb 9.6 oz (89.6 kg), SpO2 98 %. BP (!) 143/59 (BP Location: Right Arm)   Pulse 85   Temp 98.3 F (36.8 C) (Oral)   Resp 18   Ht 5' 4"  (1.626 m)   Wt 197 lb 9.6 oz (89.6 kg)   SpO2 98%   BMI 33.92 kg/m   General Appearance:    Alert, cooperative, no distress, appears stated age, accompanied by her sister, Becky  Head:    Normocephalic, without obvious abnormality, atraumatic  Eyes:    conjunctiva/corneas clear, EOM's intact  Ears:    Normal external ear bilaterallly.  Nose:   Nares normal, septum midline, mucosa normal, no drainage    or sinus tenderness  Throat:   Lips, mucosa, and tongue normal  Neck:   Supple, symmetrical, trachea midline, no  adenopathy;    thyroid:  no enlargement/tenderness/nodules.  Back:     Symmetric, no curvature, ROM normal, no CVA  tenderness  Lungs:     Clear to auscultation bilaterally, respirations unlabored  Chest Wall:    No tenderness or deformity   Heart:    Regular rate and rhythm, S1 and S2 normal, no murmur, rub   or gallop  Breast Exam:    Right breast without any palpable abnormalities or skin changes. Left breast with upper outer quadrant lumpectomy scar site well healed without any signs of infection. No palpable left breast abnormalities or skin changes. Chronic nipple inversion noted on left.   Abdomen:     Soft, non-tender, bowel sounds active all four quadrants,    no masses, no organomegaly  Genitalia:    Rectal:    Extremities:   Extremities normal, atraumatic, no cyanosis or edema  Pulses:   2+ and symmetric all extremities  Skin:   Skin color, texture, turgor normal, no rashes or lesions  Lymph nodes:   Cervical, supraclavicular, and axillary nodes normal  Neurologic:   CNII-XII intact, normal strength, sensation and reflexes    throughout     LABORATORY DATA:  CBC    Component Value Date/Time   WBC 6.2 01/19/2016 1137   WBC 5.8 09/10/2013 1509   WBC 6.7 05/28/2012 0515   RBC 4.34 01/19/2016 1137   RBC 4.6 09/10/2013 1509   RBC 3.60 (L) 05/28/2012 0515   HGB 14.1 09/10/2013 1509   HGB 11.2 (L) 05/28/2012 0515   HCT 41.3 01/19/2016 1137   PLT 174 01/19/2016 1137   MCV 95 01/19/2016 1137   MCH 31.8 01/19/2016 1137   MCH 30.8 09/10/2013 1509   MCH 31.1 05/28/2012 0515   MCHC 33.4 01/19/2016 1137   MCHC 33.5 09/10/2013 1509   MCHC 36.1 (H) 05/28/2012 0515   RDW 13.6 01/19/2016 1137   LYMPHSABS 1.5 01/19/2016 1137   MONOABS 0.7 05/01/2009 1740   EOSABS 0.1 01/19/2016 1137   BASOSABS 0.0 01/19/2016 1137     Chemistry      Component Value Date/Time   NA 139 03/17/2016 1151   K 4.8 03/17/2016 1151   CL 96 03/17/2016 1151   CO2 25 03/17/2016 1151   BUN 18  03/17/2016 1151   CREATININE 1.28 (H) 03/17/2016 1151   CREATININE 1.18 (H) 03/23/2013 1327      Component Value Date/Time   CALCIUM 9.3 03/17/2016 1151   CALCIUM 9.4 10/22/2009 1414   ALKPHOS 70 03/17/2016 1151   AST 28 03/17/2016 1151   ALT 32 03/17/2016 1151   BILITOT 0.4 03/17/2016 1151      RADIOGRAPHY: CLINICAL DATA:  Patient presents for needle localization of left breast lesion prior to excision.  EXAM: MAMMOGRAPHIC GUIDED RADIOACTIVE SEED LOCALIZATION OF THE LEFT BREAST  COMPARISON:  12/10/2015 and earlier  FINDINGS: Patient presents for radioactive seed localization prior to excision. I met with the patient and we discussed the procedure of seed localization including benefits and alternatives. We discussed the high likelihood of a successful procedure. We discussed the risks of the procedure including infection, bleeding, tissue injury and further surgery. We discussed the low dose of radioactivity involved in the procedure. Informed, written consent was given.  The usual time-out protocol was performed immediately prior to the procedure.  Using mammographic guidance, sterile technique, 1% lidocaine and an I-125 radioactive seed, clip in the upper portion of the left breast was localized using a cephalad approach. The follow-up mammogram images confirm the seed in the expected location and were marked for Dr. Marlou Starks.  Follow-up survey of the patient confirms presence  of the radioactive seed.  Order number of I-125 seed:  [id number].  Total activity:  0.045 millicuries  Reference Date: 01/06/2016  The patient tolerated the procedure well and was released from the [facility]. She was given instructions regarding seed removal.  IMPRESSION: Radioactive seed localization of the left breast. No apparent complications.   Electronically Signed   By: Nolon Nations M.D.   On: 01/21/2016 13:50   PATHOLOGY:    Diagnosis Breast, lumpectomy,  Left Breast Tissue w/seed - COMPLEX SCLEROSING LESION WITH USUAL DUCTAL HYPERPLASIA. - FIBROCYSTIC CHANGES WITH CALCIFICATIONS. - BIOPSY SITE CHANGES. - NO MALIGNANCY IDENTIFIED. Claudette Laws MD Pathologist, Electronic Signature (Case signed 01/26/2016)  ASSESSMENT/PLAN:   Radial scar of L breast Complex sclerosing lesion/radial scar showing fibrocystic changes with usual ductal hyperplasia with associated calcifications biopsy proven on 12/12/2015 followed by a left breast lumpectomy by Dr. Marlou Starks III on 01/22/2016 again confirming aforementioned diagnosis without any identified malignancy. Of note, she has chronic left nipple inversion times many years. There was no evidence of atypical ductal hyperplasia or other high risk breast lesion.  We discussed that she does not need XRT nor chemoprevention.   Chart is reviewed.   I personally reviewed and went over pathology results with the patient.  She is provided education regarding complex sclerosing lesion/radial scar.  Radial scars, also called complex sclerosing lesions, are a pathologic diagnosis, usually discovered incidentally when a breast mass or radiologic abnormality is removed or biopsied. Occasionally, radial scars are large enough to be detected by mammography, which cannot reliably differentiate between these lesions and spiculated carcinoma. Radial scars are characterized microscopically by a fibroelastic core with radiating ducts and lobules.  There is ongoing controversy about the need for surgical excision when radial scars are found on core biopsy.  Usually these are excised since most series show that 8 to 17 percent of surgical specimens at subsequent excision are positive for malignancy. In addition to the possibility of finding an unrecognized in situ or invasive component, there is some evidence that radial scars may be premalignant lesions, meaning that they can slowly progress from scar to hyperplasia to carcinoma over time.   No additional treatment beyond excision is needed for radial scars. The risk of subsequent breast cancer after excision in this population is small, and chemoprevention is not indicated.  Surveillance includes annual mammogram in addition to annual breast exam.  Mammogram is due in February 2018.  No role for labs from an oncology perspective.  Return in 1 year for follow-up.  All questions were answered. The patient knows to call the clinic with any problems, questions or concerns. We can certainly see the patient much sooner if necessary.  This note is electronically signed [place]:[email or web address] Cyril Mourning, MD  07/07/2016 10:38 PM

## 2016-07-08 ENCOUNTER — Other Ambulatory Visit: Payer: Medicare Other

## 2016-07-08 DIAGNOSIS — R350 Frequency of micturition: Secondary | ICD-10-CM | POA: Diagnosis not present

## 2016-07-10 LAB — URINE CULTURE

## 2016-07-11 ENCOUNTER — Encounter (HOSPITAL_COMMUNITY): Payer: Self-pay | Admitting: Oncology

## 2016-07-15 ENCOUNTER — Telehealth: Payer: Self-pay | Admitting: Family Medicine

## 2016-07-15 NOTE — Telephone Encounter (Signed)
Patient aware.

## 2016-07-19 ENCOUNTER — Ambulatory Visit (INDEPENDENT_AMBULATORY_CARE_PROVIDER_SITE_OTHER): Payer: Medicare Other | Admitting: Pharmacist

## 2016-07-19 ENCOUNTER — Encounter: Payer: Self-pay | Admitting: Pharmacist

## 2016-07-19 VITALS — BP 132/70 | HR 78 | Ht 64.0 in | Wt 198.0 lb

## 2016-07-19 DIAGNOSIS — E119 Type 2 diabetes mellitus without complications: Secondary | ICD-10-CM | POA: Diagnosis not present

## 2016-07-19 DIAGNOSIS — M810 Age-related osteoporosis without current pathological fracture: Secondary | ICD-10-CM | POA: Diagnosis not present

## 2016-07-19 DIAGNOSIS — Z23 Encounter for immunization: Secondary | ICD-10-CM

## 2016-07-19 DIAGNOSIS — R6889 Other general symptoms and signs: Secondary | ICD-10-CM | POA: Diagnosis not present

## 2016-07-19 DIAGNOSIS — R5382 Chronic fatigue, unspecified: Secondary | ICD-10-CM

## 2016-07-19 DIAGNOSIS — Z794 Long term (current) use of insulin: Secondary | ICD-10-CM

## 2016-07-19 DIAGNOSIS — E669 Obesity, unspecified: Secondary | ICD-10-CM | POA: Diagnosis not present

## 2016-07-19 MED ORDER — DULAGLUTIDE 1.5 MG/0.5ML ~~LOC~~ SOAJ: 1.5000 mg | SUBCUTANEOUS | 2 refills | Status: DC

## 2016-07-19 NOTE — Progress Notes (Signed)
Patient ID: [name], female   DOB: 03-29-1941, 75 y.o.   MRN: [id number] Diabetes Follow-Up Visit Chief Complaint:   Chief Complaint  Patient presents with  . Diabetes    HPI:  Diabetes: patient has long standing diabetes.  Her most recent A1C was 7.0%.   Current medications for diabetes include -  Trulicity 0.75mg  q week, Lantus 34 units daily and Novolog sliding scale (BG less than 80 - no insulin, 80 to 120 - 3 units, 121 to 150 - 5 units, 151 to 200 - 7 units, 201 to 250 - 8 units, 250 or above - 9 units and call office) Patient has tried metformin in past but stopped because she had low energy.  Once she stopped metformin her energy level improved but she is unsure if it really was metformin or not.  Current GFR = 41 Checks BG 3  times per day.  HBG ranges from 61 to 199 (only 1 reading less than 70 in last 2 months) Average BG is 160 Diet - Continues to try to limit calorie or CHO intake.  She states that Trulicity is helping some with appetite.   Fatigue / weakness / fibromyalgia -  Patient reports that she easily tires from housework, getting dressed or taking shower.  Has been present for about 3 to 6 months.    Current Outpatient Prescriptions:  .  aspirin 81 MG tablet, Take 81 mg by mouth daily.  , Disp: , Rfl:  .  atorvastatin (LIPITOR) 20 MG tablet, Take 0.5 tablets (10 mg total) by mouth at bedtime., Disp: 45 tablet, Rfl: 3 .  Calcium Carb-Cholecalciferol (CALCIUM 600+D3) 600-200 MG-UNIT TABS, Take 1 tablet by mouth 2 (two) times daily.  , Disp: , Rfl:  .  desonide (DESOWEN) 0.05 % ointment, Apply 1 application topically 2 (two) times daily. , Disp: , Rfl:  .  diclofenac sodium (VOLTAREN) 1 % GEL, Apply 4 g topically 4 (four) times daily., Disp: 100 g, Rfl: 3 .  esomeprazole (NEXIUM) 20 MG capsule, Take 1 capsule (20 mg total) by mouth daily at 12 noon., Disp: 90 capsule, Rfl: 3 .  estradiol (ESTRACE) 0.1 MG/GM vaginal cream, Place 2 g vaginally every Monday, Wednesday,  and Friday. , Disp: , Rfl:  .  glucose blood (FREESTYLE LITE) test strip, Use qid to check blood sugar. Dx E11.9, Disp: 200 each, Rfl: 11 .  insulin aspart (NOVOLOG FLEXPEN) 100 UNIT/ML FlexPen, Inject prior to each meal per sliding scale.BG less than 80 - no insulin; 80 to 120 - 3 units; 121 to 150 - 5 units; 151 to 200 - 7 units; 201 to 250 - 8 units; 250 or above - 9 units and call office., Disp: 30 mL, Rfl: 3 .  Insulin Glargine (LANTUS SOLOSTAR) 100 UNIT/ML Solostar Pen, Inject 38 Units into the skin every morning. Please note increase in dose, Disp: 45 mL, Rfl: 0 .  Insulin Pen Needle (NOVOFINE) 30G X 8 MM MISC, Use to inject insulin up to qid., Disp: 400 each, Rfl: 3 .  losartan (COZAAR) 25 MG tablet, Take 1 tablet (25 mg total) by mouth daily., Disp: 90 tablet, Rfl: 3 .  metoprolol succinate (TOPROL-XL) 25 MG 24 hr tablet, Take 0.5 tablets (12.5 mg total) by mouth daily., Disp: 45 tablet, Rfl: 3 .  mirabegron ER (MYRBETRIQ) 50 MG TB24 tablet, Take 50 mg by mouth daily., Disp: , Rfl:  .  mometasone (NASONEX) 50 MCG/ACT nasal spray, Place 2 sprays into the nose  daily., Disp: 17 g, Rfl: 12 .  Multiple Vitamin (MULTIVITAMIN) tablet, Take 0.5 tablets by mouth 2 (two) times daily. , Disp: , Rfl:  .  nystatin (NYSTATIN) powder, Apply under bilateral breasts and groin BID, Disp: 60 g, Rfl: 1 .  nystatin-triamcinolone ointment (MYCOLOG), Apply 1 application topically 2 (two) times daily., Disp: 30 g, Rfl: 2 .  Polyethyl Glycol-Propyl Glycol 0.4-0.3 % SOLN, Apply to eye., Disp: , Rfl:  .  Probiotic Product (PROBIOTIC ADVANCED PO), Take by mouth., Disp: , Rfl:  .  traZODone (DESYREL) 50 MG tablet, Take 0.5-1 tablets (25-50 mg total) by mouth at bedtime., Disp: 90 tablet, Rfl: 3 .  triamcinolone (KENALOG) 0.025 % ointment, Apply 1 application topically 2 (two) times daily., Disp: 30 g, Rfl: 0 .  venlafaxine XR (EFFEXOR XR) 37.5 MG 24 hr capsule, Take 1 capsule (37.5 mg total) by mouth daily with  breakfast., Disp: 30 capsule, Rfl: 3 .  Dulaglutide (TRULICITY) 1.5 0000000 SOPN, Inject 1.5 mg into the skin once a week., Disp: 4 pen, Rfl: 2    Lab Results  Component Value Date   HGBA1C 6.5 12/18/2015      Lab Results  Component Value Date   CHOL 120 03/17/2016   HDL 60 03/17/2016   LDLCALC 40 03/17/2016   TRIG 100 03/17/2016   CHOLHDL 2.0 03/17/2016    There were no vitals filed for this visit. Filed Weights   07/19/16 1441  Weight: 198 lb (89.8 kg)   \ Body mass index is 33.99 kg/m.  Assessment: 1.  Diabetes - Controlled but occasional elevated BG 2.  Blood Pressure.  At goal today 3.  Obesity - no significant change over the last 12 months 4.  Fatigue / weakness    Recommendations: 1.  Medication recommendations at this time are as follows:      Continue Lantus 34 units daily    Increase Trulicity to 1.5mg  SQ q week   Continue Novolog sliding scale   BG less than 80 - no insulin    80 to 120 - 3 units    121 to 150 - 5 units    151 to 200 - 7 units    201 to 250 - 8 units    250 or above - 9 units and call office  2.  Reviewed HBG goals:  Fasting 80-130 and 1-2 hour post prandial <180.  Patient is instructed to check BG 3 to 4 times per day.  3.  Continue with diet - limiting high CHO containing foods.  Also restart daily exercise as able - goal is 150 minutes per week 4.  BP goal < 140/80. Continue current BP meds  5.   Orders Placed This Encounter  Procedures  . CBC with Differential/Platelet  . Vitamin B12  . VITAMIN D 25 Hydroxy (Vit-D Deficiency, Fractures)  . Thyroid Panel With TSH   6.  Due follow up after started Effexor per PCP note - appt made for 1 week.  7.  Received influenza vaccine with education in office today    Cherre Robins, PharmD, CPP, CDE     Patient ID: [name], female   DOB: [birth year], 75 y.o.   MRN: [id number]

## 2016-07-20 LAB — CBC WITH DIFFERENTIAL/PLATELET
BASOS ABS: 0 10*3/uL (ref 0.0–0.2)
Basos: 1 %
EOS (ABSOLUTE): 0.3 10*3/uL (ref 0.0–0.4)
Eos: 5 %
HEMOGLOBIN: 12.6 g/dL (ref 11.1–15.9)
Hematocrit: 37 % (ref 34.0–46.6)
Immature Grans (Abs): 0 10*3/uL (ref 0.0–0.1)
Immature Granulocytes: 0 %
LYMPHS ABS: 1.9 10*3/uL (ref 0.7–3.1)
Lymphs: 31 %
MCH: 31.7 pg (ref 26.6–33.0)
MCHC: 34.1 g/dL (ref 31.5–35.7)
MCV: 93 fL (ref 79–97)
MONOCYTES: 11 %
MONOS ABS: 0.7 10*3/uL (ref 0.1–0.9)
NEUTROS ABS: 3.2 10*3/uL (ref 1.4–7.0)
Neutrophils: 52 %
Platelets: 172 10*3/uL (ref 150–379)
RBC: 3.98 x10E6/uL (ref 3.77–5.28)
RDW: 13.1 % (ref 12.3–15.4)
WBC: 6.1 10*3/uL (ref 3.4–10.8)

## 2016-07-20 LAB — VITAMIN B12: VITAMIN B 12: 844 pg/mL (ref 211–946)

## 2016-07-20 LAB — THYROID PANEL WITH TSH
Free Thyroxine Index: 2 (ref 1.2–4.9)
T3 UPTAKE RATIO: 31 % (ref 24–39)
T4, Total: 6.6 ug/dL (ref 4.5–12.0)
TSH: 1.63 u[IU]/mL (ref 0.450–4.500)

## 2016-07-20 LAB — VITAMIN D 25 HYDROXY (VIT D DEFICIENCY, FRACTURES): Vit D, 25-Hydroxy: 54 ng/mL (ref 30.0–100.0)

## 2016-07-22 DIAGNOSIS — R350 Frequency of micturition: Secondary | ICD-10-CM | POA: Diagnosis not present

## 2016-07-22 DIAGNOSIS — N3941 Urge incontinence: Secondary | ICD-10-CM | POA: Diagnosis not present

## 2016-07-30 ENCOUNTER — Ambulatory Visit (INDEPENDENT_AMBULATORY_CARE_PROVIDER_SITE_OTHER): Payer: Medicare Other | Admitting: Family Medicine

## 2016-07-30 ENCOUNTER — Encounter: Payer: Self-pay | Admitting: Family Medicine

## 2016-07-30 VITALS — BP 138/71 | HR 86 | Temp 97.0°F | Ht 64.0 in | Wt 198.6 lb

## 2016-07-30 DIAGNOSIS — E1122 Type 2 diabetes mellitus with diabetic chronic kidney disease: Secondary | ICD-10-CM

## 2016-07-30 DIAGNOSIS — M797 Fibromyalgia: Secondary | ICD-10-CM

## 2016-07-30 DIAGNOSIS — Z794 Long term (current) use of insulin: Secondary | ICD-10-CM | POA: Diagnosis not present

## 2016-07-30 DIAGNOSIS — N183 Chronic kidney disease, stage 3 (moderate): Secondary | ICD-10-CM | POA: Diagnosis not present

## 2016-07-30 DIAGNOSIS — G4700 Insomnia, unspecified: Secondary | ICD-10-CM

## 2016-07-30 MED ORDER — VENLAFAXINE HCL ER 75 MG PO CP24: 75.0000 mg | ORAL_CAPSULE | Freq: Every day | ORAL | 3 refills | Status: DC

## 2016-07-30 MED ORDER — INSULIN GLARGINE 100 UNIT/ML SOLOSTAR PEN: 34.0000 [IU] | PEN_INJECTOR | SUBCUTANEOUS | 3 refills | Status: DC

## 2016-07-30 MED ORDER — INSULIN ASPART 100 UNIT/ML FLEXPEN: PEN_INJECTOR | SUBCUTANEOUS | 3 refills | Status: DC

## 2016-07-30 NOTE — Progress Notes (Signed)
   HPI  Patient presents today to follow-up for fibromyalgia, insomnia, and needing insulin refills.  Fibromyalgia She is tolerating Effexor, however not seeing any improvement yet. Denies any concerns about side effects so far.  Insomnia Trazodone is helping, however she would like to try 75 mg.  DM2 Taking 34 units of Lantus daily, taking 3-5 units sliding-scale NovoLog Also tolerating trulicity well.  PMH: Smoking status noted ROS: Per HPI  Objective: BP 138/71   Pulse 86   Temp 97 F (36.1 C) (Oral)   Ht 5\' 4"  (1.626 m)   Wt 198 lb 9.6 oz (90.1 kg)   BMI 34.09 kg/m  Gen: NAD, alert, cooperative with exam HEENT: NCAT CV: RRR, good S1/S2, no murmur Resp: CTABL, no wheezes, non-labored Ext: No edema, warm Neuro: Alert and oriented, No gross deficits  MSK:  L sided paraspinal muscles with mild tenderness to palp in the thoracic area  Assessment and plan:  # Fibromyalgia Tolerating Effexor well, increase to 75 mg daily Follow-up in 2 months Discussed again that it will take more than 4 weeks for improvement with Effexor. Consider titration 150 mg if tolerated at 75 and has incomplete symptom resolution  # Insomnia Trazodone is helping, discussed titration with her Discussed using 50-100 mg once nightly. Considering how sensitive she has to medications I have titrated extremely slowly.   # Type 2 diabetes Refilled insulin as requested, previous A1c performed 2 months ago, follow-up 4-6 weeks for repeat A1c.  Meds ordered this encounter  Medications  . DISCONTD: venlafaxine XR (EFFEXOR-XR) 75 MG 24 hr capsule    Sig: Take 1 capsule (75 mg total) by mouth daily with breakfast.    Dispense:  90 capsule    Refill:  3  . insulin aspart (NOVOLOG FLEXPEN) 100 UNIT/ML FlexPen    Sig: Inject prior to each meal per sliding scale.BG less than 80 - no insulin; 80 to 120 - 3 units; 121 to 150 - 5 units; 151 to 200 - 7 units; 201 to 250 - 8 units; 250 or above - 9 units  and call office.    Dispense:  30 mL    Refill:  3  . Insulin Glargine (LANTUS SOLOSTAR) 100 UNIT/ML Solostar Pen    Sig: Inject 34 Units into the skin every morning.    Dispense:  45 mL    Refill:  3  . venlafaxine XR (EFFEXOR-XR) 75 MG 24 hr capsule    Sig: Take 1 capsule (75 mg total) by mouth daily with breakfast.    Dispense:  90 capsule    Refill:  [place], MD [facility] 07/30/2016, 4:56 PM

## 2016-07-30 NOTE — Patient Instructions (Signed)
Great to see you!  Come [place] in 2 months to discuss diabetes and fibromyalgia, I expect the venlafaxine to be helping by that point

## 2016-08-04 ENCOUNTER — Telehealth: Payer: Self-pay | Admitting: Family Medicine

## 2016-08-05 ENCOUNTER — Other Ambulatory Visit: Payer: Self-pay

## 2016-08-05 DIAGNOSIS — Z794 Long term (current) use of insulin: Secondary | ICD-10-CM

## 2016-08-05 DIAGNOSIS — E1143 Type 2 diabetes mellitus with diabetic autonomic (poly)neuropathy: Secondary | ICD-10-CM

## 2016-08-05 DIAGNOSIS — E1142 Type 2 diabetes mellitus with diabetic polyneuropathy: Secondary | ICD-10-CM | POA: Diagnosis not present

## 2016-08-05 DIAGNOSIS — I10 Essential (primary) hypertension: Secondary | ICD-10-CM

## 2016-08-05 DIAGNOSIS — L84 Corns and callosities: Secondary | ICD-10-CM | POA: Diagnosis not present

## 2016-08-05 DIAGNOSIS — B351 Tinea unguium: Secondary | ICD-10-CM | POA: Diagnosis not present

## 2016-08-05 DIAGNOSIS — E785 Hyperlipidemia, unspecified: Secondary | ICD-10-CM

## 2016-08-05 MED ORDER — METOPROLOL SUCCINATE ER 25 MG PO TB24: 12.5000 mg | ORAL_TABLET | Freq: Every day | ORAL | 3 refills | Status: DC

## 2016-08-05 MED ORDER — ATORVASTATIN CALCIUM 20 MG PO TABS: 10.0000 mg | ORAL_TABLET | Freq: Every day | ORAL | 3 refills | Status: DC

## 2016-08-05 MED ORDER — INSULIN PEN NEEDLE 30G X 8 MM MISC: 3 refills | Status: DC

## 2016-08-05 NOTE — Telephone Encounter (Signed)
They were printed on 07/27/16

## 2016-08-11 DIAGNOSIS — N3946 Mixed incontinence: Secondary | ICD-10-CM | POA: Diagnosis not present

## 2016-08-11 DIAGNOSIS — N3020 Other chronic cystitis without hematuria: Secondary | ICD-10-CM | POA: Diagnosis not present

## 2016-08-11 DIAGNOSIS — R350 Frequency of micturition: Secondary | ICD-10-CM | POA: Diagnosis not present

## 2016-08-18 DIAGNOSIS — H6123 Impacted cerumen, bilateral: Secondary | ICD-10-CM | POA: Insufficient documentation

## 2016-08-18 DIAGNOSIS — J302 Other seasonal allergic rhinitis: Secondary | ICD-10-CM | POA: Insufficient documentation

## 2016-08-18 DIAGNOSIS — H6121 Impacted cerumen, right ear: Secondary | ICD-10-CM | POA: Diagnosis not present

## 2016-08-18 DIAGNOSIS — H6983 Other specified disorders of Eustachian tube, bilateral: Secondary | ICD-10-CM | POA: Insufficient documentation

## 2016-08-18 DIAGNOSIS — H6993 Unspecified Eustachian tube disorder, bilateral: Secondary | ICD-10-CM | POA: Insufficient documentation

## 2016-08-24 ENCOUNTER — Telehealth: Payer: Self-pay | Admitting: Family Medicine

## 2016-08-24 MED ORDER — VENLAFAXINE HCL ER 37.5 MG PO CP24: 37.5000 mg | ORAL_CAPSULE | Freq: Every day | ORAL | 3 refills | Status: DC

## 2016-08-24 NOTE — Telephone Encounter (Signed)
Ok to change back, Rx sent  Laroy Apple, MD [facility] 08/24/2016, 12:47 PM

## 2016-08-24 NOTE — Telephone Encounter (Signed)
Patient aware.

## 2016-08-26 ENCOUNTER — Ambulatory Visit (INDEPENDENT_AMBULATORY_CARE_PROVIDER_SITE_OTHER): Payer: Medicare Other | Admitting: Ophthalmology

## 2016-08-27 DIAGNOSIS — R531 Weakness: Secondary | ICD-10-CM | POA: Diagnosis not present

## 2016-09-06 ENCOUNTER — Ambulatory Visit: Payer: Medicare Other | Admitting: Pediatrics

## 2016-09-08 ENCOUNTER — Other Ambulatory Visit: Payer: Self-pay | Admitting: Family

## 2016-09-08 DIAGNOSIS — N760 Acute vaginitis: Secondary | ICD-10-CM

## 2016-09-08 DIAGNOSIS — B372 Candidiasis of skin and nail: Secondary | ICD-10-CM

## 2016-09-09 ENCOUNTER — Ambulatory Visit (INDEPENDENT_AMBULATORY_CARE_PROVIDER_SITE_OTHER): Payer: Medicare Other

## 2016-09-09 ENCOUNTER — Ambulatory Visit (INDEPENDENT_AMBULATORY_CARE_PROVIDER_SITE_OTHER): Payer: Medicare Other | Admitting: Family Medicine

## 2016-09-09 ENCOUNTER — Encounter: Payer: Self-pay | Admitting: Family Medicine

## 2016-09-09 VITALS — BP 133/71 | HR 76 | Temp 97.1°F | Ht 64.0 in | Wt 197.6 lb

## 2016-09-09 DIAGNOSIS — M797 Fibromyalgia: Secondary | ICD-10-CM | POA: Diagnosis not present

## 2016-09-09 DIAGNOSIS — M546 Pain in thoracic spine: Secondary | ICD-10-CM | POA: Diagnosis not present

## 2016-09-09 DIAGNOSIS — F411 Generalized anxiety disorder: Secondary | ICD-10-CM

## 2016-09-09 MED ORDER — TRAZODONE HCL 150 MG PO TABS: 75.0000 mg | ORAL_TABLET | Freq: Every day | ORAL | 3 refills | Status: DC

## 2016-09-09 MED ORDER — GABAPENTIN 100 MG PO CAPS
100.0000 mg | ORAL_CAPSULE | Freq: Every day | ORAL | 3 refills | Status: DC
Start: 2016-09-09 — End: 2016-09-29

## 2016-09-09 NOTE — Progress Notes (Signed)
   HPI  Patient presents today for follow-up of fibromyalgia, anxiety, and thoracic back pain.  Patient's now had left-sided thoracic back pain for about 2 months, it has not been helped by Effexor. She denies any injury. She has worsening symptoms with bending or twisting movements. She has no history of cancer  Effexor has been causing dry mouth, constipation, and headaches. She has not stopped the medication but is taking 37.5 mg once daily.  Anxiety is stable Recently helped by Cymbalta plus Lexapro, however not helped by Cymbalta alone.  getting good benefit from 75 mg of trazodone  PMH: Smoking status noted ROS: Per HPI  Objective: BP 133/71   Pulse 76   Temp 97.1 F (36.2 C) (Oral)   Ht 5\' 4"  (1.626 m)   Wt 197 lb 9.6 oz (89.6 kg)   BMI 33.92 kg/m  Gen: NAD, alert, cooperative with exam HEENT: NCAT CV: RRR, good S1/S2, no murmur Resp: CTABL, no wheezes, non-labored Ext: No edema, warm Neuro: Alert and oriented, No gross deficits  MSK:  Tenderness to palpation of left-sided paraspinal muscles in the thoracic area, no thoracic spinal tenderness   Assessment and plan:  # Thoracic back pain Now for 2 months Appears to be musculoskeletal in nature, however likely related to fibromyalgia as well No improvement with Effexor physical therapy order written Plain film today, chest x-ray last visit ruled out underlying pleural effusion or infiltrate  # Anxiety Previously helped by Cymbalta plus Lexapro, did not have good benefit from only Cymbalta, Not tolerating Effexor, discontinue Changing to gabapentin  # Fibromyalgia Stable, not helped by Effexor Hopefully gabapentin will help all 3 problems Start with 100 mg 3 times daily, work her way up to 300 mg at night for sleep and to see if we can do some benefit for pain and anxiety.     Orders Placed This Encounter  Procedures  . DG Thoracic Spine 2 View    Standing Status:   Future    Standing Expiration  Date:   11/09/2017    Order Specific Question:   Reason for Exam (SYMPTOM  OR DIAGNOSIS REQUIRED)    Answer:   thoracic back pain    Order Specific Question:   Preferred imaging location?    Answer:   Internal  . Ambulatory referral to Physical Therapy    Referral Priority:   Routine    Referral Type:   Physical Medicine    Referral Reason:   Specialty Services Required    Requested Specialty:   Physical Therapy    Number of Visits Requested:   1    Meds ordered this encounter  Medications  . gabapentin (NEURONTIN) 100 MG capsule    Sig: Take 1-3 capsules (100-300 mg total) by mouth at bedtime.    Dispense:  90 capsule    Refill:  Lynden, MD [facility] 09/09/2016, 10:20 AM

## 2016-09-09 NOTE — Patient Instructions (Signed)
Great to see you!  I started you on gabapentin. I would like to start off with 1 capsule at night. Then you can slowly increase to 1 capsule 3 times daily.  To help you sleep you can work your way up to 3 capsules at night.  Lets see you again in 1 month to see how the medicine is helping. It can help your anxiety as well.

## 2016-09-10 DIAGNOSIS — H40033 Anatomical narrow angle, bilateral: Secondary | ICD-10-CM | POA: Diagnosis not present

## 2016-09-10 DIAGNOSIS — H2513 Age-related nuclear cataract, bilateral: Secondary | ICD-10-CM | POA: Diagnosis not present

## 2016-09-10 LAB — HM DIABETES EYE EXAM

## 2016-09-15 ENCOUNTER — Telehealth: Payer: Self-pay | Admitting: Family Medicine

## 2016-09-15 NOTE — Telephone Encounter (Signed)
Pt was called with results She states that she is still having back pains -- she describes it as a "catch" and "excrutiating pain" She is up to 2 per day of the gabapentin = she will go up to 3 a day and see if this helps  She is aware that bradshaw is out of the office until Friday and she will contact him at that time if 3 / day doesn't seem to help.  FYI.

## 2016-09-19 NOTE — Telephone Encounter (Signed)
Appreciate FYI,   Dose escalation as discussed with pt.    Laroy Apple, MD [facility] 09/19/2016, 3:02 PM

## 2016-09-20 ENCOUNTER — Ambulatory Visit: Payer: Medicare Other | Admitting: Family Medicine

## 2016-09-21 ENCOUNTER — Telehealth: Payer: Self-pay | Admitting: Family Medicine

## 2016-09-21 ENCOUNTER — Ambulatory Visit: Payer: Medicare Other | Attending: Family Medicine | Admitting: Physical Therapy

## 2016-09-21 DIAGNOSIS — M546 Pain in thoracic spine: Secondary | ICD-10-CM | POA: Diagnosis not present

## 2016-09-21 MED ORDER — FLUCONAZOLE 150 MG PO TABS: ORAL_TABLET | ORAL | 0 refills | Status: DC

## 2016-09-21 NOTE — Therapy (Addendum)
[facility] [place], [place], [address] Phone: [phone]   Fax:  [phone]  Physical Therapy Evaluation  Patient Details  Name: [name] MRN: [id number] Date of Birth: 02-09-1941 Referring Provider: Kenn File  Encounter Date: 09/21/2016      PT End of Session - 09/21/16 1353    Visit Number 1   Number of Visits 12   Date for PT Re-Evaluation 11/02/16   PT Start Time 6967   PT Stop Time 1447   PT Time Calculation (min) 54 min   Activity Tolerance Patient limited by pain   Behavior During Therapy [facility] for tasks assessed/performed      Past Medical History:  Diagnosis Date  . Acute cystitis   . Depression   . Diabetes mellitus type II   . Diabetic retinopathy   . DM type 2 causing CKD stage 3 ([place])   . Fibromyalgia   . GERD (gastroesophageal reflux disease)   . Hyperlipidemia   . Hypertension   . IBS (irritable bowel syndrome)   . IBS (irritable bowel syndrome)   . Insomnia   . Lichen planus    vulvar  . Osteoporosis   . Pancreatitis, gallstone   . Postmenopausal   . Radial scar of breast 06/04/2016  . Urge incontinence of urine     Past Surgical History:  Procedure Laterality Date  . ANKLE SURGERY  02/2004  . BREAST LUMPECTOMY WITH RADIOACTIVE SEED LOCALIZATION Left 01/22/2016   Procedure: LEFT BREAST LUMPECTOMY WITH RADIOACTIVE SEED LOCALIZATION;  Surgeon: Autumn Messing III, MD;  Location: [facility];  Service: General;  Laterality: Left;  . CATARACT EXTRACTION W/ INTRAOCULAR LENS IMPLANT  09/2003, 12/2003  . CHOLECYSTECTOMY  2004  . EYE SURGERY     laser treatments  . JOINT REPLACEMENT    . TOTAL KNEE ARTHROPLASTY     Left  . TRIGGER FINGER RELEASE Left 01/30/2002  . TUBAL LIGATION  1970    There were no vitals filed for this visit.       Subjective Assessment - 09/21/16 1358    Subjective Patient began experiencing left thoracic spine pain which wraps around to  under breast in August 2017. She states it may have been aggravated by moving sale. Patient also reports pain in her knees, shoulders and hips.    Pertinent History HTN, DM, arthritis, L breast lumpectomy (benign) 2017   Patient Stated Goals to get rid of pain   Currently in Pain? Yes   Pain Score 10-Worst pain ever   Pain Location Back   Pain Orientation Left;Mid   Pain Descriptors / Indicators Stabbing   Pain Type Acute pain   Pain Radiating Towards around to Left breast   Pain Onset More than a month ago   Pain Frequency Constant   Aggravating Factors  getting in/out of car, sit<>stand, supine <> sit   Pain Relieving Factors nothing            [facility] PT Assessment - 09/21/16 0001      Assessment   Medical Diagnosis acute left sided thoracic spine pain   Referring Provider Kenn File   Onset Date/Surgical Date 05/25/16   Next MD Visit 10/11/16     Balance Screen   Has the patient fallen in the past 6 months No   Has the patient had a decrease in activity level because of a fear of falling?  No   Is the patient reluctant to leave their home because  of a fear of falling?  No     Home Ecologist residence   Living Arrangements Alone     Prior Function   Level of Independence Independent     Observation/Other Assessments   Focus on Therapeutic Outcomes (FOTO)  61% limited     ROM / Strength   AROM / PROM / Strength AROM     AROM   Overall AROM Comments Lumbar flex, SB limited by 75%     Palpation   Spinal mobility unable to assess due to patient positioning limitations; Rib springing performed in supine produced no significant change in pain   Palpation comment marked tenderness from L bra line distally to ilia and along diaphragm from midline to spine                   [facility] Adult PT Treatment/Exercise - 09/21/16 0001      Modalities   Modalities Electrical Stimulation;Moist Heat     Moist Heat Therapy   Number  Minutes Moist Heat 15 Minutes   Moist Heat Location Other (comment)  L flank     Electrical Stimulation   Electrical Stimulation Location L flank (tspine) IFC 80-150 Hz x 15 min   Electrical Stimulation Goals Pain                     PT Long Term Goals - 09/21/16 1553      PT LONG TERM GOAL #1   Title I with HEP   Time 4   Period Weeks   Status New     PT LONG TERM GOAL #2   Title Decreased pain in L flank by 75% with ADLS.   Time 6   Period Weeks   Status New     PT LONG TERM GOAL #3   Title Patient to report no pain with breathing.    Time 6   Period Weeks   Status New               Plan - 09/21/16 1542    Clinical Impression Statement Patient presents with insidious onset of L thoracic spine pain beginning four months ago. She has significant limitations in lumbar ROM and marked tenderness of left flank including her diaphragm. She has severe pain with ADLs including deep breathing.    Rehab Potential Good   PT Frequency 2x / week   PT Duration 6 weeks   PT Treatment/Interventions ADLs/Self Care Home Management;Cryotherapy;Electrical Stimulation;Moist Heat;Ultrasound;Patient/family education;Neuromuscular re-education;Therapeutic exercise;Manual techniques;Dry needling   PT Next Visit Plan STW/manual therapy to L flank and diaphragm; check rib and t-spine mobility if pt able to tolerate; modalities for pain.   PT Home Exercise Plan diaphragmatic breathing   Consulted and Agree with Plan of Care Patient      Patient will benefit from skilled therapeutic intervention in order to improve the following deficits and impairments:  Decreased range of motion, Pain, Decreased activity tolerance  Visit Diagnosis: Pain in thoracic spine - Plan: PT plan of care cert/re-cert      G-Codes - 81/19/14 1556    Functional Assessment Tool Used FOTO 61% LIMITED   Functional Limitation Other PT primary   Other PT Primary Current Status (N8295) At least 60  percent but less than 80 percent impaired, limited or restricted   Other PT Primary Goal Status (A2130) At least 40 percent but less than 60 percent impaired, limited or restricted  Problem List Patient Active Problem List   Diagnosis Date Noted  . Diabetic neuropathy ([place]) 06/18/2016  . OAB (overactive bladder) 06/18/2016  . Radial scar of breast 06/04/2016  . Healthcare maintenance 01/19/2016  . Diabetic retinopathy ([place]) 09/16/2015  . Obesity (BMI 30-39.9) 07/04/2014  . Aneurysm of iliac artery ([place]) 01/07/2014  . GERD (gastroesophageal reflux disease) 03/23/2013  . Type 2 diabetes mellitus with insulin therapy ([place]) 03/23/2013  . Insomnia 03/23/2013  . Other malaise and fatigue 03/23/2013  . MITRAL VALVE PROLAPSE 07/17/2009  . Lichen planus 85/46/2703  . Fibromyalgia 07/17/2009  . VAGINITIS 04/04/2009  . Urinary incontinence 03/14/2009  . CHRONIC ULCER OF OTHER SPECIFIED SITE 12/24/2008  . Irritable bowel syndrome 08/29/2008  . BACK PAIN, LUMBAR 07/17/2008  . ACUTE CYSTITIS 01/24/2008  . UNSPECIFIED OSTEOPOROSIS 01/10/2008  . Dyslipidemia associated with type 2 diabetes mellitus ([place]) 05/22/2007  . Generalized anxiety disorder 05/22/2007  . Essential hypertension 05/22/2007   Madelyn Flavors PT 09/21/2016, 4:07 PM  [facility] [address] [place], [place], [address] Phone: [phone]   Fax:  [phone]  Name: [name] MRN: [id number] Date of Birth: 02/19/1941  PHYSICAL THERAPY DISCHARGE SUMMARY  Visits from Start of Care: 1.  Current functional level related to goals / functional outcomes: See above.   Remaining deficits: See above.   Education / Equipment:  Plan: Patient agrees to discharge.  Patient goals were not met. Patient is being discharged due to not returning since the last visit.  ?????         Mali Applegate MPT

## 2016-09-21 NOTE — Telephone Encounter (Signed)
OK to dc gabapentin. Will need to be seen to discuss alternative treatments., She is clearly not tolerating the medication.   If symptoms do not quikly improve after stopping please com ein to be seen.   Laroy Apple, MD [facility] 09/21/2016, 12:11 PM

## 2016-09-21 NOTE — Telephone Encounter (Signed)
Pt advised of md feedback and pt voiced understanding.

## 2016-09-21 NOTE — Telephone Encounter (Signed)
Please review and advise.

## 2016-09-23 ENCOUNTER — Ambulatory Visit (INDEPENDENT_AMBULATORY_CARE_PROVIDER_SITE_OTHER): Payer: Medicare Other | Admitting: Orthopaedic Surgery

## 2016-09-23 ENCOUNTER — Encounter (INDEPENDENT_AMBULATORY_CARE_PROVIDER_SITE_OTHER): Payer: Self-pay | Admitting: Orthopaedic Surgery

## 2016-09-23 VITALS — BP 122/64 | HR 77 | Ht 63.0 in | Wt 195.0 lb

## 2016-09-23 DIAGNOSIS — M456 Ankylosing spondylitis lumbar region: Secondary | ICD-10-CM | POA: Diagnosis not present

## 2016-09-23 NOTE — Progress Notes (Signed)
Office Visit Note   Patient: [name]           Date of Birth: 07-04-1941           MRN: [id number] Visit Date: 09/23/2016              Requested by: Timmothy Euler, MD [place], [place] [id number] PCP: Kenn File, MD   Assessment & Plan: Visit Diagnoses:  1. Ankylosing spondylitis of lumbar region [facility])     Plan: Patient centigray in 3 months back pain pain that radiates more in her left and right. Previous radiographs reviewed on PACS system with patient I gave her copies of the films. She has some ankylosis in the thoracic spine lumbar spine. Facet arthropathy present. She's been on Cymbalta Lexapro Effexor Neurontin. She currently is not taking narcotics. She has diabetes with peripheral neuropathy is insulin-dependent. We'll obtain an MRI scan to evaluate lumbar spine to rule out lateral recess stenosis. Also follow-up after scan  Follow-Up Instructions: Return in about 2 weeks (around 10/07/2016).   Orders:  Orders Placed This Encounter  Procedures  . MR Lumbar Spine w/o contrast   No orders of the defined types were placed in this encounter.     Procedures: No procedures performed   Clinical Data: No additional findings.   Subjective: Chief Complaint  Patient presents with  . Middle Back - Pain    Patient has left back pain that comes around under her ribs x 3 months. She states that she has multiple joint pain, but her back is the worst.  She denies any radicular symptoms.  She does have constipation but feels this is possibly from the pain meds that she takes.  She is using Cymbalta, Lexapro, Effexor, and Neurontin.  Patient has had some associated constipation which she relates to some the medication she's been taking. Her pain is been present for 3 months. Pain radiates from her back and her buttocks sometimes under her feet. She states she's worse when she is standing or walking. She has some numbness of her feet related to diabetic  neuropathy. She denies associated bladder symptoms.  Review of Systems  Constitutional: Negative for chills and diaphoresis.  HENT: Negative for ear discharge, ear pain and nosebleeds.   Eyes: Negative for discharge and visual disturbance.  Respiratory: Negative for cough, choking and shortness of breath.   Cardiovascular: Negative for chest pain and palpitations.  Gastrointestinal: Negative for abdominal distention and abdominal pain.  Endocrine: Negative for cold intolerance and heat intolerance.  Genitourinary: Negative for flank pain and hematuria.  Musculoskeletal: Positive for arthralgias and back pain.  Skin: Negative for rash and wound.  Neurological: Negative for seizures and speech difficulty.  Hematological: Negative for adenopathy. Does not bruise/bleed easily.  Psychiatric/Behavioral: Negative for agitation and suicidal ideas.     Objective: Vital Signs: BP 122/64   Pulse 77   Ht 5\' 3"  (1.6 m)   Wt 195 lb (88.5 kg)   BMI 34.54 kg/m   Physical Exam  Constitutional: She is oriented to person, place, and time. She appears well-developed.  HENT:  Head: Normocephalic.  Right Ear: External ear normal.  Left Ear: External ear normal.  Eyes: Pupils are equal, round, and reactive to light.  Neck: No tracheal deviation present. No thyromegaly present.  Cardiovascular: Normal rate.   Pulmonary/Chest: Effort normal.  Abdominal: Soft.  Musculoskeletal:  Patient has tenderness of the lumbosacral junction tenderness of the sciatic notch. Some pain with  straight leg raising. Knee and ankle jerk are symmetrical. Negative popliteal compression test. Pain with hip range of motion. Mild crepitus with knee extension. No synovitis of either knee collateral ligaments are stable. Anterior tib EHL is strong. Distal pulses are palpable. No venous stasis changes negative Homan test.  Neurological: She is alert and oriented to person, place, and time.  Skin: Skin is warm and dry.    Psychiatric: She has a normal mood and affect. Her behavior is normal.    Ortho Exam patient has discomfort with forward flexion lumbar spine. Limitation of lateral bending and extension. Mild limitation of cervical rotation. No upper extremity hyperreflexia. No lower extremity clonus.  Specialty Comments:  No specialty comments available.  Imaging: No results found.   PMFS History: Patient Active Problem List   Diagnosis Date Noted  . Diabetic neuropathy ([place]) 06/18/2016  . OAB (overactive bladder) 06/18/2016  . Radial scar of breast 06/04/2016  . Healthcare maintenance 01/19/2016  . Diabetic retinopathy ([place]) 09/16/2015  . Obesity (BMI 30-39.9) 07/04/2014  . Aneurysm of iliac artery ([place]) 01/07/2014  . GERD (gastroesophageal reflux disease) 03/23/2013  . Type 2 diabetes mellitus with insulin therapy ([place]) 03/23/2013  . Insomnia 03/23/2013  . Other malaise and fatigue 03/23/2013  . MITRAL VALVE PROLAPSE 07/17/2009  . Lichen planus [id number]  . Fibromyalgia 07/17/2009  . VAGINITIS 04/04/2009  . Urinary incontinence 03/14/2009  . CHRONIC ULCER OF OTHER SPECIFIED SITE 12/24/2008  . Irritable bowel syndrome 08/29/2008  . BACK PAIN, LUMBAR 07/17/2008  . ACUTE CYSTITIS 01/24/2008  . UNSPECIFIED OSTEOPOROSIS 01/10/2008  . Dyslipidemia associated with type 2 diabetes mellitus ([place]) 05/22/2007  . Generalized anxiety disorder 05/22/2007  . Essential hypertension 05/22/2007   Past Medical History:  Diagnosis Date  . Acute cystitis   . Depression   . Diabetes mellitus type II   . Diabetic retinopathy   . DM type 2 causing CKD stage 3 ([place])   . Fibromyalgia   . GERD (gastroesophageal reflux disease)   . Hyperlipidemia   . Hypertension   . IBS (irritable bowel syndrome)   . IBS (irritable bowel syndrome)   . Insomnia   . Lichen planus    vulvar  . Osteoporosis   . Pancreatitis, gallstone   . Postmenopausal   . Radial scar of breast 06/04/2016  . Urge incontinence of  urine     Family History  Problem Relation Age of Onset  . Diabetes Mother   . Hypertension Mother   . Kidney disease Mother   . Heart disease Mother   . Peripheral vascular disease Mother     amputation  . Cancer Father     prostate  . Diabetes Father   . Heart disease Father     CABG  . Hypertension Father   . Heart attack Father   . Depression Sister   . Hypertension Sister     Past Surgical History:  Procedure Laterality Date  . ANKLE SURGERY  02/2004  . BREAST LUMPECTOMY WITH RADIOACTIVE SEED LOCALIZATION Left 01/22/2016   Procedure: LEFT BREAST LUMPECTOMY WITH RADIOACTIVE SEED LOCALIZATION;  Surgeon: Autumn Messing III, MD;  Location: [facility];  Service: General;  Laterality: Left;  . CATARACT EXTRACTION W/ INTRAOCULAR LENS IMPLANT  09/2003, 12/2003  . CHOLECYSTECTOMY  2004  . EYE SURGERY     laser treatments  . JOINT REPLACEMENT    . TOTAL KNEE ARTHROPLASTY     Left  . TRIGGER FINGER RELEASE Left 01/30/2002  . TUBAL LIGATION  1970   Social History   Occupational History  . retired Retired   Social History Main Topics  . Smoking status: Never Smoker  . Smokeless tobacco: Never Used  . Alcohol use No  . Drug use: No  . Sexual activity: No

## 2016-09-24 ENCOUNTER — Emergency Department (HOSPITAL_COMMUNITY): Payer: Medicare Other

## 2016-09-24 ENCOUNTER — Emergency Department (HOSPITAL_COMMUNITY)
Admission: EM | Admit: 2016-09-24 | Discharge: 2016-09-25 | Disposition: A | Discharge status: Home / Self Care | Payer: Medicare Other | Attending: Emergency Medicine | Admitting: Emergency Medicine

## 2016-09-24 ENCOUNTER — Encounter (HOSPITAL_COMMUNITY): Payer: Self-pay | Admitting: *Deleted

## 2016-09-24 DIAGNOSIS — I129 Hypertensive chronic kidney disease with stage 1 through stage 4 chronic kidney disease, or unspecified chronic kidney disease: Secondary | ICD-10-CM | POA: Insufficient documentation

## 2016-09-24 DIAGNOSIS — E1122 Type 2 diabetes mellitus with diabetic chronic kidney disease: Secondary | ICD-10-CM | POA: Insufficient documentation

## 2016-09-24 DIAGNOSIS — R1032 Left lower quadrant pain: Secondary | ICD-10-CM | POA: Diagnosis not present

## 2016-09-24 DIAGNOSIS — K5900 Constipation, unspecified: Secondary | ICD-10-CM | POA: Insufficient documentation

## 2016-09-24 DIAGNOSIS — R1084 Generalized abdominal pain: Secondary | ICD-10-CM | POA: Diagnosis present

## 2016-09-24 DIAGNOSIS — R109 Unspecified abdominal pain: Secondary | ICD-10-CM | POA: Diagnosis not present

## 2016-09-24 DIAGNOSIS — Z794 Long term (current) use of insulin: Secondary | ICD-10-CM | POA: Insufficient documentation

## 2016-09-24 DIAGNOSIS — R05 Cough: Secondary | ICD-10-CM | POA: Diagnosis not present

## 2016-09-24 DIAGNOSIS — R1114 Bilious vomiting: Secondary | ICD-10-CM | POA: Diagnosis not present

## 2016-09-24 DIAGNOSIS — N183 Chronic kidney disease, stage 3 (moderate): Secondary | ICD-10-CM | POA: Insufficient documentation

## 2016-09-24 DIAGNOSIS — Z79899 Other long term (current) drug therapy: Secondary | ICD-10-CM | POA: Insufficient documentation

## 2016-09-24 DIAGNOSIS — R531 Weakness: Secondary | ICD-10-CM | POA: Diagnosis not present

## 2016-09-24 LAB — URINALYSIS, ROUTINE W REFLEX MICROSCOPIC
BILIRUBIN URINE: NEGATIVE
Glucose, UA: NEGATIVE mg/dL
HGB URINE DIPSTICK: NEGATIVE
Leukocytes, UA: NEGATIVE
NITRITE: NEGATIVE
PH: 6 (ref 5.0–8.0)
Protein, ur: NEGATIVE mg/dL
Specific Gravity, Urine: 1.01 (ref 1.005–1.030)

## 2016-09-24 LAB — COMPREHENSIVE METABOLIC PANEL
ALBUMIN: 3.8 g/dL (ref 3.5–5.0)
ALT: 22 U/L (ref 14–54)
ANION GAP: 10 (ref 5–15)
AST: 28 U/L (ref 15–41)
Alkaline Phosphatase: 67 U/L (ref 38–126)
BUN: 27 mg/dL — ABNORMAL HIGH (ref 6–20)
CHLORIDE: 97 mmol/L — AB (ref 101–111)
CO2: 25 mmol/L (ref 22–32)
Calcium: 8.9 mg/dL (ref 8.9–10.3)
Creatinine, Ser: 1.3 mg/dL — ABNORMAL HIGH (ref 0.44–1.00)
GFR calc non Af Amer: 39 mL/min — ABNORMAL LOW (ref >60)
GFR, EST AFRICAN AMERICAN: 45 mL/min — AB (ref >60)
GLUCOSE: 145 mg/dL — AB (ref 65–99)
POTASSIUM: 4.1 mmol/L (ref 3.5–5.1)
SODIUM: 132 mmol/L — AB (ref 135–145)
Total Bilirubin: 0.5 mg/dL (ref 0.3–1.2)
Total Protein: 6.8 g/dL (ref 6.5–8.1)

## 2016-09-24 LAB — CBC WITH DIFFERENTIAL/PLATELET
BASOS PCT: 0 %
Basophils Absolute: 0 10*3/uL (ref 0.0–0.1)
Eosinophils Absolute: 0 10*3/uL (ref 0.0–0.7)
Eosinophils Relative: 0 %
HEMATOCRIT: 39.5 % (ref 36.0–46.0)
HEMOGLOBIN: 13.8 g/dL (ref 12.0–15.0)
LYMPHS ABS: 1.3 10*3/uL (ref 0.7–4.0)
LYMPHS PCT: 10 %
MCH: 31.6 pg (ref 26.0–34.0)
MCHC: 34.9 g/dL (ref 30.0–36.0)
MCV: 90.4 fL (ref 78.0–100.0)
MONOS PCT: 8 %
Monocytes Absolute: 1 10*3/uL (ref 0.1–1.0)
NEUTROS ABS: 10.8 10*3/uL — AB (ref 1.7–7.7)
NEUTROS PCT: 82 %
Platelets: 165 10*3/uL (ref 150–400)
RBC: 4.37 MIL/uL (ref 3.87–5.11)
RDW: 12.5 % (ref 11.5–15.5)
WBC: 13.1 10*3/uL — ABNORMAL HIGH (ref 4.0–10.5)

## 2016-09-24 LAB — LIPASE, BLOOD: Lipase: 13 U/L (ref 11–51)

## 2016-09-24 MED ORDER — IOPAMIDOL (ISOVUE-300) INJECTION 61%: 100.0000 mL | Freq: Once | INTRAVENOUS | Status: DC | PRN

## 2016-09-24 MED ORDER — ONDANSETRON HCL 4 MG/2ML IJ SOLN
4.0000 mg | INTRAMUSCULAR | Status: DC | PRN
  Administered 2016-09-24: 4 mg via INTRAVENOUS
  Filled 2016-09-24: qty 2

## 2016-09-24 NOTE — ED Provider Notes (Signed)
[facility] DEPT Provider Note   CSN: [id number] Arrival date & time: 09/24/16  1940     History   Chief Complaint Chief Complaint  Patient presents with  . Abdominal Pain    HPI [name] is a 75 y.o. female.  HPI  Pt was seen at 2155.  Per pt, c/o gradual onset and persistence of constant generalized abd "pain" for the past 3 days.  Has been associated with generalized weakness/fatigue, constipation and several episodes of N/V.  Describes the abd pain as "cramping." States her PMD told her to "just stop taking" her neurontin (due to side effects) which she did this past week.  Denies diarrhea, no fevers, no back pain, no rash, no CP/SOB, no black or blood in stools or emesis.      Past Medical History:  Diagnosis Date  . Acute cystitis   . Depression   . Diabetes mellitus type II   . Diabetic retinopathy   . DM type 2 causing CKD stage 3 ([place])   . Fibromyalgia   . GERD (gastroesophageal reflux disease)   . Hyperlipidemia   . Hypertension   . IBS (irritable bowel syndrome)   . IBS (irritable bowel syndrome)   . Insomnia   . Lichen planus    vulvar  . Osteoporosis   . Pancreatitis, gallstone   . Postmenopausal   . Radial scar of breast 06/04/2016  . Urge incontinence of urine     Patient Active Problem List   Diagnosis Date Noted  . Diabetic neuropathy ([place]) 06/18/2016  . OAB (overactive bladder) 06/18/2016  . Radial scar of breast 06/04/2016  . Healthcare maintenance 01/19/2016  . Diabetic retinopathy ([place]) 09/16/2015  . Obesity (BMI 30-39.9) 07/04/2014  . Aneurysm of iliac artery ([place]) 01/07/2014  . GERD (gastroesophageal reflux disease) 03/23/2013  . Type 2 diabetes mellitus with insulin therapy ([place]) 03/23/2013  . Insomnia 03/23/2013  . Other malaise and fatigue 03/23/2013  . MITRAL VALVE PROLAPSE 07/17/2009  . Lichen planus [id number]  . Fibromyalgia 07/17/2009  . VAGINITIS 04/04/2009  . Urinary incontinence 03/14/2009  . CHRONIC ULCER OF  OTHER SPECIFIED SITE 12/24/2008  . Irritable bowel syndrome 08/29/2008  . BACK PAIN, LUMBAR 07/17/2008  . ACUTE CYSTITIS 01/24/2008  . UNSPECIFIED OSTEOPOROSIS 01/10/2008  . Dyslipidemia associated with type 2 diabetes mellitus ([place]) 05/22/2007  . Generalized anxiety disorder 05/22/2007  . Essential hypertension 05/22/2007    Past Surgical History:  Procedure Laterality Date  . ANKLE SURGERY  02/2004  . BREAST LUMPECTOMY WITH RADIOACTIVE SEED LOCALIZATION Left 01/22/2016   Procedure: LEFT BREAST LUMPECTOMY WITH RADIOACTIVE SEED LOCALIZATION;  Surgeon: Autumn Messing III, MD;  Location: [facility];  Service: General;  Laterality: Left;  . CATARACT EXTRACTION W/ INTRAOCULAR LENS IMPLANT  09/2003, 12/2003  . CHOLECYSTECTOMY  2004  . EYE SURGERY     laser treatments  . JOINT REPLACEMENT    . TOTAL KNEE ARTHROPLASTY     Left  . TRIGGER FINGER RELEASE Left 01/30/2002  . TUBAL LIGATION  1970    OB History    No data available       Home Medications    Prior to Admission medications   Medication Sig Start Date End Date Taking? Authorizing Provider  aspirin 81 MG tablet Take 81 mg by mouth daily.     Yes Historical Provider, MD  atorvastatin (LIPITOR) 20 MG tablet Take 0.5 tablets (10 mg total) by mouth at bedtime. 08/05/16  Yes Eustaquio Maize, MD  bisacodyl (DULCOLAX) 5 MG EC tablet Take 5 mg by mouth daily as needed for moderate constipation.   Yes Historical Provider, MD  Calcium Carb-Cholecalciferol (CALCIUM 600+D3) 600-200 MG-UNIT TABS Take 1 tablet by mouth 2 (two) times daily.     Yes Historical Provider, MD  cephALEXin (KEFLEX) 250 MG capsule Take 250 mg by mouth daily. To prevent urinary tract infections   Yes Historical Provider, MD  darifenacin (ENABLEX) 15 MG 24 hr tablet Take 15 mg by mouth every morning.  09/13/16  Yes Historical Provider, MD  desonide (DESOWEN) 0.05 % ointment Apply 1 application topically 2 (two) times daily.    Yes Historical Provider, MD    diclofenac sodium (VOLTAREN) 1 % GEL Apply 4 g topically 4 (four) times daily. Patient taking differently: Apply 4 g topically 4 (four) times daily. For back pain 06/18/16  Yes Timmothy Euler, MD  Dulaglutide (TRULICITY) 1.5 0000000 SOPN Inject 1.5 mg into the skin once a week. Patient taking differently: Inject 1.5 mg into the skin every Thursday.  07/19/16  Yes Cherre Robins, PharmD  esomeprazole (NEXIUM) 20 MG capsule Take 1 capsule (20 mg total) by mouth daily at 12 noon. 07/02/16  Yes Timmothy Euler, MD  estradiol (ESTRACE) 0.1 MG/GM vaginal cream Place 2 g vaginally every Monday, Wednesday, and Friday.    Yes Historical Provider, MD  insulin aspart (NOVOLOG FLEXPEN) 100 UNIT/ML FlexPen Inject prior to each meal per sliding scale.BG less than 80 - no insulin; 80 to 120 - 3 units; 121 to 150 - 5 units; 151 to 200 - 7 units; 201 to 250 - 8 units; 250 or above - 9 units and call office. 07/30/16  Yes Timmothy Euler, MD  Insulin Glargine (LANTUS SOLOSTAR) 100 UNIT/ML Solostar Pen Inject 34 Units into the skin every morning. 07/30/16  Yes Timmothy Euler, MD  losartan (COZAAR) 25 MG tablet Take 1 tablet (25 mg total) by mouth daily. 03/17/16  Yes Timmothy Euler, MD  metoprolol succinate (TOPROL-XL) 25 MG 24 hr tablet Take 0.5 tablets (12.5 mg total) by mouth daily. Patient taking differently: Take 12.5 mg by mouth every evening.  08/05/16  Yes Eustaquio Maize, MD  mirabegron ER (MYRBETRIQ) 50 MG TB24 tablet Take 50 mg by mouth daily.   Yes Bjorn Loser, MD  mometasone (NASONEX) 50 MCG/ACT nasal spray Place 2 sprays into the nose daily. 05/19/16  Yes Sharion Balloon, FNP  Multiple Vitamin (MULTIVITAMIN) tablet Take 0.5 tablets by mouth 2 (two) times daily.    Yes Historical Provider, MD  NYSTATIN powder APPLY UNDER BILATERAL BREASTS AND GROIN TWICE A DAY 09/08/16  Yes Sharion Balloon, FNP  nystatin-triamcinolone ointment (MYCOLOG) Apply 1 application topically 2 (two) times daily. Patient  taking differently: Apply 1 application topically daily as needed.  05/17/16  Yes Sharion Balloon, FNP  Polyethyl Glycol-Propyl Glycol 0.4-0.3 % SOLN Apply 1 drop to eye daily as needed (for dry eye relief).    Yes Historical Provider, MD  Probiotic Product (PROBIOTIC ADVANCED PO) Take 1 capsule by mouth daily.    Yes Historical Provider, MD  psyllium (HYDROCIL/METAMUCIL) 95 % PACK Take 1 packet by mouth daily as needed for mild constipation.   Yes Historical Provider, MD  traZODone (DESYREL) 150 MG tablet Take 0.5 tablets (75 mg total) by mouth at bedtime. 09/09/16  Yes Timmothy Euler, MD  triamcinolone (KENALOG) 0.025 % ointment Apply 1 application topically 2 (two) times daily. Patient taking differently: Apply 1 application  topically daily as needed.  03/27/16  Yes Sharion Balloon, FNP  fluconazole (DIFLUCAN) 150 MG tablet Take one pill and repeat in 3 days Patient not taking: Reported on 09/24/2016 09/21/16   Timmothy Euler, MD  gabapentin (NEURONTIN) 100 MG capsule Take 1-3 capsules (100-300 mg total) by mouth at bedtime. Patient not taking: Reported on 09/23/2016 09/09/16   Timmothy Euler, MD  glucose blood (FREESTYLE LITE) test strip Use qid to check blood sugar. Dx E11.9 01/21/16   Eustaquio Maize, MD  Insulin Pen Needle (NOVOFINE) 30G X 8 MM MISC Use to inject insulin up to qid. 08/05/16   Eustaquio Maize, MD    Family History Family History  Problem Relation Age of Onset  . Diabetes Mother   . Hypertension Mother   . Kidney disease Mother   . Heart disease Mother   . Peripheral vascular disease Mother     amputation  . Cancer Father     prostate  . Diabetes Father   . Heart disease Father     CABG  . Hypertension Father   . Heart attack Father   . Depression Sister   . Hypertension Sister     Social History Social History  Substance Use Topics  . Smoking status: Never Smoker  . Smokeless tobacco: Never Used  . Alcohol use No     Allergies   Latex; Ambien  [zolpidem]; Augmentin [amoxicillin-pot clavulanate]; Belsomra [suvorexant]; Fluticasone; Hydrocodone-acetaminophen; Hyoscyamine sulfate; Melatonin; Mobic [meloxicam]; Morphine; Oxycodone; Tramadol; Celebrex [celecoxib]; and Sulfa antibiotics   Review of Systems Review of Systems ROS: Statement: All systems negative except as marked or noted in the HPI; Constitutional: Negative for fever and chills. +generalized weakness/fatigue.; ; Eyes: Negative for eye pain, redness and discharge. ; ; ENMT: Negative for ear pain, hoarseness, nasal congestion, sinus pressure and sore throat. ; ; Cardiovascular: Negative for chest pain, palpitations, diaphoresis, dyspnea and peripheral edema. ; ; Respiratory: Negative for cough, wheezing and stridor. ; ; Gastrointestinal: +N/V, constipation, abd pain. Negative for diarrhea, blood in stool, hematemesis, jaundice and rectal bleeding. . ; ; Genitourinary: Negative for dysuria, flank pain and hematuria. ; ; Musculoskeletal: Negative for back pain and neck pain. Negative for swelling and trauma.; ; Skin: Negative for pruritus, rash, abrasions, blisters, bruising and skin lesion.; ; Neuro: Negative for headache, lightheadedness and neck stiffness. Negative for altered level of consciousness, altered mental status, extremity weakness, paresthesias, involuntary movement, seizure and syncope.       Physical Exam Updated Vital Signs BP 148/73 (BP Location: Left Arm)   Pulse 96   Temp 97.6 F (36.4 C) (Oral)   Resp 20   Ht 5\' 3"  (1.6 m)   Wt 195 lb (88.5 kg)   SpO2 100%   BMI 34.54 kg/m    22:43 Orthostatic Vital Signs JH  Orthostatic Lying   BP- Lying: 139/63  Pulse- Lying: 97      Orthostatic Sitting  BP- Sitting: 125/56  Pulse- Sitting: 96      Orthostatic Standing at 0 minutes  BP- Standing at 0 minutes: 132/75  Pulse- Standing at 0 minutes: 101     Physical Exam 2200: Physical examination:  Nursing notes reviewed; Vital signs and O2 SAT reviewed;   Constitutional: Well developed, Well nourished, Well hydrated, In no acute distress; Head:  Normocephalic, atraumatic; Eyes: EOMI, PERRL, No scleral icterus; ENMT: Mouth and pharynx normal, Mucous membranes moist; Neck: Supple, Full range of motion, No lymphadenopathy; Cardiovascular: Regular rate and rhythm, No  gallop; Respiratory: Breath sounds clear & equal bilaterally, No wheezes.  Speaking full sentences with ease, Normal respiratory effort/excursion; Chest: Nontender, Movement normal; Abdomen: Soft, +mild diffuse tenderness to palp. No rebound or guarding. Nondistended, Normal bowel sounds; Genitourinary: No CVA tenderness; Extremities: Pulses normal, No tenderness, No edema, No calf edema or asymmetry.; Neuro: AA&Ox3, Major CN grossly intact.  Speech clear. No gross focal motor or sensory deficits in extremities.; Skin: Color normal, Warm, Dry.; Psych:  Affect flat.    ED Treatments / Results  Labs (all labs ordered are listed, but only abnormal results are displayed)   EKG  EKG Interpretation None       Radiology   Procedures Procedures (including critical care time)  Medications Ordered in ED Medications  ondansetron (ZOFRAN) injection 4 mg (not administered)  iopamidol (ISOVUE-300) 61 % injection 100 mL (not administered)     Initial Impression / Assessment and Plan / ED Course  I have reviewed the triage vital signs and the nursing notes.  Pertinent labs & imaging results that were available during my care of the patient were reviewed by me and considered in my medical decision making (see chart for details).  MDM Reviewed: previous chart, nursing note and vitals Reviewed previous: labs Interpretation: labs, x-ray and CT scan   Results for orders placed or performed during the hospital encounter of 09/24/16  Comprehensive metabolic panel  Result Value Ref Range   Sodium 132 (L) 135 - 145 mmol/L   Potassium 4.1 3.5 - 5.1 mmol/L   Chloride 97 (L) 101 - 111 mmol/L    CO2 25 22 - 32 mmol/L   Glucose, Bld 145 (H) 65 - 99 mg/dL   BUN 27 (H) 6 - 20 mg/dL   Creatinine, Ser 1.30 (H) 0.44 - 1.00 mg/dL   Calcium 8.9 8.9 - 10.3 mg/dL   Total Protein 6.8 6.5 - 8.1 g/dL   Albumin 3.8 3.5 - 5.0 g/dL   AST 28 15 - 41 U/L   ALT 22 14 - 54 U/L   Alkaline Phosphatase 67 38 - 126 U/L   Total Bilirubin 0.5 0.3 - 1.2 mg/dL   GFR calc non Af Amer 39 (L) >60 mL/min   GFR calc Af Amer 45 (L) >60 mL/min   Anion gap 10 5 - 15  CBC WITH DIFFERENTIAL  Result Value Ref Range   WBC 13.1 (H) 4.0 - 10.5 K/uL   RBC 4.37 3.87 - 5.11 MIL/uL   Hemoglobin 13.8 12.0 - 15.0 g/dL   HCT 39.5 36.0 - 46.0 %   MCV 90.4 78.0 - 100.0 fL   MCH 31.6 26.0 - 34.0 pg   MCHC 34.9 30.0 - 36.0 g/dL   RDW 12.5 11.5 - 15.5 %   Platelets 165 150 - 400 K/uL   Neutrophils Relative % 82 %   Neutro Abs 10.8 (H) 1.7 - 7.7 K/uL   Lymphocytes Relative 10 %   Lymphs Abs 1.3 0.7 - 4.0 K/uL   Monocytes Relative 8 %   Monocytes Absolute 1.0 0.1 - 1.0 K/uL   Eosinophils Relative 0 %   Eosinophils Absolute 0.0 0.0 - 0.7 K/uL   Basophils Relative 0 %   Basophils Absolute 0.0 0.0 - 0.1 K/uL  Lipase, blood  Result Value Ref Range   Lipase 13 11 - 51 U/L  Urinalysis, Routine w reflex microscopic  Result Value Ref Range   Color, Urine YELLOW YELLOW   APPearance CLEAR CLEAR   Specific Gravity, Urine 1.010 1.005 -  1.030   pH 6.0 5.0 - 8.0   Glucose, UA NEGATIVE NEGATIVE mg/dL   Hgb urine dipstick NEGATIVE NEGATIVE   Bilirubin Urine NEGATIVE NEGATIVE   Ketones, ur TRACE (A) NEGATIVE mg/dL   Protein, ur NEGATIVE NEGATIVE mg/dL   Nitrite NEGATIVE NEGATIVE   Leukocytes, UA NEGATIVE NEGATIVE   Ct Abdomen Pelvis Wo Contrast Result Date: 09/24/2016 CLINICAL DATA:  Constipation with right and left lower quadrant crampy pain EXAM: CT ABDOMEN AND PELVIS WITHOUT CONTRAST TECHNIQUE: Multidetector CT imaging of the abdomen and pelvis was performed following the standard protocol without IV contrast.  COMPARISON:  01/14/2014 FINDINGS: Lower chest: No acute infiltrate, consolidation, or pleural effusion the lung base. Mitral valvular calcification. Heart size normal. Distortion in the central left breast likely related to post treatment changes. Hepatobiliary: No focal hepatic abnormality. Surgical clips in the gallbladder fossa consistent with prior cholecystectomy. No biliary dilatation. Pancreas: Marked fatty atrophy of the pancreas. No inflammatory process. Spleen: Normal in size without focal abnormality. Adrenals/Urinary Tract: Adrenal glands are unremarkable. Kidneys are normal, without renal calculi, focal lesion, or hydronephrosis. Bladder is unremarkable. Stomach/Bowel: Stomach is nonenlarged. There is extensive stool throughout the colon. There is a curvilinear density within the cecum, series 2 image number 52. There is fluid within the cecum an at the ileocecal junction. Appendix contains increased density within its lumen but no evidence for inflammation. Vascular/Lymphatic: Atherosclerosis.  No enlarged lymph nodes. Reproductive: Small calcified uterine fibroids.  No adnexal masses. Other: No free air or free fluid. Musculoskeletal: Degenerative changes. No acute osseous abnormality. IMPRESSION: 1. No evidence for a bowel obstruction. Extensive stool throughout the colon, consistent with constipation. Curvilinear density within the cecum, suspicious for ingested foreign body (?bony material) ; there is no associated colon wall thickening. Fluid density surrounds the foreign body and may represent liquefied stool. 2. Small appendicolith but no CT evidence for appendicitis. Electronically Signed   By: Donavan Foil M.D.   On: 09/24/2016 23:55   Dg Chest 2 View Result Date: 09/24/2016 CLINICAL DATA:  Acute onset of constipation. Vomiting, chills and generalized weakness. Nonproductive cough. Initial encounter. EXAM: CHEST  2 VIEW COMPARISON:  Chest radiograph performed 07/02/2016 FINDINGS: The lungs  are well-aerated and clear. There is no evidence of focal opacification, pleural effusion or pneumothorax. The heart is normal in size; the mediastinal contour is within normal limits. No acute osseous abnormalities are seen. Clips are noted within the right upper quadrant, reflecting prior cholecystectomy. IMPRESSION: No acute cardiopulmonary process seen. Electronically Signed   By: Garald Balding M.D.   On: 09/24/2016 23:49    2355:  Denies orthostasis during VS. CT constipation with ?FB d/w pt: states "it's probably the dulcolax pill." Otherwise denies FB ingestion. Otherwise workup reassuring. Pt has tol PO well while in the ED without N/V. Pt states she would like to go home now. Tx symptomatically, f/u PMD. Dx and testing d/w pt.  Questions answered.  Verb understanding, agreeable to d/c home with outpt f/u.   Final Clinical Impressions(s) / ED Diagnoses   Final diagnoses:  None    New Prescriptions New Prescriptions   No medications on file     Francine Graven, DO 09/27/16 2031

## 2016-09-24 NOTE — ED Triage Notes (Signed)
States she stopped taking Neurontin abruptly, c/o constipation and abdominal pain

## 2016-09-24 NOTE — ED Notes (Signed)
[facility] and dulcolax for BM States abd pain shortly after  Report N better

## 2016-09-25 NOTE — Discharge Instructions (Signed)
Take over the counter laxative (such as miralax, milk of magnesia, senokot) AND a dulcolax suppository (or enema) today and repeat both tomorrow.  Begin to take over the counter stool softener (colace or miralax), as directed on packaging, for the next month.  Continue to take your usual prescriptions as previously directed.  Call your regular medical doctor on Monday to schedule a follow up appointment within the next 2 days.  Return to the Emergency Department immediately if worsening.

## 2016-09-25 NOTE — ED Notes (Signed)
Unable to sign due to update

## 2016-09-28 ENCOUNTER — Ambulatory Visit: Payer: Medicare Other | Admitting: *Deleted

## 2016-09-28 LAB — URINE CULTURE: Culture: 30000 — AB

## 2016-09-29 ENCOUNTER — Telehealth (HOSPITAL_BASED_OUTPATIENT_CLINIC_OR_DEPARTMENT_OTHER): Payer: Self-pay | Admitting: Emergency Medicine

## 2016-09-29 ENCOUNTER — Encounter: Payer: Self-pay | Admitting: Family Medicine

## 2016-09-29 ENCOUNTER — Ambulatory Visit (INDEPENDENT_AMBULATORY_CARE_PROVIDER_SITE_OTHER): Payer: Medicare Other | Admitting: Family Medicine

## 2016-09-29 VITALS — BP 114/59 | HR 82 | Temp 98.4°F | Ht 63.0 in | Wt 194.4 lb

## 2016-09-29 DIAGNOSIS — Z794 Long term (current) use of insulin: Secondary | ICD-10-CM | POA: Diagnosis not present

## 2016-09-29 DIAGNOSIS — E119 Type 2 diabetes mellitus without complications: Secondary | ICD-10-CM

## 2016-09-29 DIAGNOSIS — K5900 Constipation, unspecified: Secondary | ICD-10-CM

## 2016-09-29 DIAGNOSIS — G4700 Insomnia, unspecified: Secondary | ICD-10-CM

## 2016-09-29 LAB — BAYER DCA HB A1C WAIVED: HB A1C: 7.3 % — AB (ref <7.0)

## 2016-09-29 NOTE — Patient Instructions (Addendum)
Great to see you!  I agree with you, lets stop gabapentin  You are doing very good keeping track of your sugars, keep it up!  Come back in 3 months unless you need [place] sooner.

## 2016-09-29 NOTE — Progress Notes (Signed)
   HPI  Patient presents today for ER follow-up and for diabetes follow-up.  She was seen in emergency room for abdominal pain, she thought that this may be due to constipation. After extensive workup he was found to be due to constipation. She has been taking ducal lax, stool softeners, and neurologic since getting home. She states that she's had one large stool and felt somewhat better after that.  I will pain is improved but she still has some intermittent nausea.  She states she still feels constipated.  Diabetes Patient has extensive blood sugar logs, her fasting blood sugar ranges average of 150-200 Her average pre-meal at noon and supper is 175-225 She has a few elevations to the upper 200s, a few readings in the 70s. No hypoglycemia Mild foot tingling, no pain.  She was recently diagnosed with ankylosing spondylitis by orthopedic surgery, Dr. Lorin Mercy at [facility]  Trazodone is working well  Patient states that PMH: Smoking status noted ROS: Per HPI  Objective: BP (!) 114/59   Pulse 82   Temp 98.4 F (36.9 C) (Oral)   Ht 5\' 3"  (1.6 m)   Wt 194 lb 6.4 oz (88.2 kg)   BMI 34.44 kg/m  Gen: NAD, alert, cooperative with exam HEENT: NCAT CV: RRR, good S1/S2, no murmur Resp: CTABL, no wheezes, non-labored Ext: No edema, warm Neuro: Alert and oriented, No gross deficits  Diabetic Foot Exam - Simple   Simple Foot Form Visual Inspection No deformities, no ulcerations, no other skin breakdown bilaterally:  Yes Sensation Testing Intact to touch and monofilament testing bilaterally:  Yes Pulse Check Posterior Tibialis and Dorsalis pulse intact bilaterally:  Yes Comments      Assessment and plan:  # 2 diabetes Reasonable control, A1c 7.3 Continue current insulin doses, she can easily lift them for me Continue GLP-1 Foot exam normal Return to clinic in 3 months unless needed sooner  # constipation Discussed titrating miralax  # Insomnia Trazodone  helping, could be contributing constipation discussed trial off of it if she would like    Orders Placed This Encounter  Procedures  . [facility] Hb A1c Carney Bern, MD [facility] 09/29/2016, 3:41 PM

## 2016-09-29 NOTE — Telephone Encounter (Signed)
Post ED Visit - Positive Culture Follow-up  Culture report reviewed by antimicrobial stewardship pharmacist:  []  Elenor Quinones, Pharm.D. []  Heide Guile, Pharm.D., BCPS []  Parks Neptune, Pharm.D. []  Alycia Rossetti, Pharm.D., BCPS []  [place], Pharm.D., BCPS, AAHIVP []  Legrand Como, Pharm.D., BCPS, AAHIVP []  Milus Glazier, Pharm.D. []  Rob Evette Doffing, Pharm.D.  Positive urine culture Treated with cephalexin, organism sensitive to the same and no further patient follow-up is required at this time.  Hazle Nordmann 09/29/2016, 12:09 PM

## 2016-10-01 ENCOUNTER — Ambulatory Visit (HOSPITAL_COMMUNITY): Payer: Medicare Other

## 2016-10-04 DIAGNOSIS — R351 Nocturia: Secondary | ICD-10-CM | POA: Diagnosis not present

## 2016-10-04 DIAGNOSIS — R350 Frequency of micturition: Secondary | ICD-10-CM | POA: Diagnosis not present

## 2016-10-05 ENCOUNTER — Ambulatory Visit (INDEPENDENT_AMBULATORY_CARE_PROVIDER_SITE_OTHER): Payer: Medicare Other | Admitting: Ophthalmology

## 2016-10-05 DIAGNOSIS — E113393 Type 2 diabetes mellitus with moderate nonproliferative diabetic retinopathy without macular edema, bilateral: Secondary | ICD-10-CM

## 2016-10-05 DIAGNOSIS — H43813 Vitreous degeneration, bilateral: Secondary | ICD-10-CM | POA: Diagnosis not present

## 2016-10-05 DIAGNOSIS — E11319 Type 2 diabetes mellitus with unspecified diabetic retinopathy without macular edema: Secondary | ICD-10-CM

## 2016-10-05 DIAGNOSIS — I10 Essential (primary) hypertension: Secondary | ICD-10-CM | POA: Diagnosis not present

## 2016-10-05 DIAGNOSIS — H35033 Hypertensive retinopathy, bilateral: Secondary | ICD-10-CM | POA: Diagnosis not present

## 2016-10-07 ENCOUNTER — Ambulatory Visit: Payer: Medicare Other | Admitting: Physical Therapy

## 2016-10-11 ENCOUNTER — Ambulatory Visit: Payer: Medicare Other | Admitting: Family Medicine

## 2016-10-11 ENCOUNTER — Ambulatory Visit: Payer: Medicare Other | Admitting: Pharmacist

## 2016-10-12 ENCOUNTER — Ambulatory Visit (INDEPENDENT_AMBULATORY_CARE_PROVIDER_SITE_OTHER): Payer: Medicare Other | Admitting: Pharmacist

## 2016-10-12 ENCOUNTER — Encounter: Payer: Self-pay | Admitting: Pharmacist

## 2016-10-12 DIAGNOSIS — Z780 Asymptomatic menopausal state: Secondary | ICD-10-CM | POA: Diagnosis not present

## 2016-10-12 DIAGNOSIS — Z794 Long term (current) use of insulin: Secondary | ICD-10-CM | POA: Diagnosis not present

## 2016-10-12 DIAGNOSIS — E119 Type 2 diabetes mellitus without complications: Secondary | ICD-10-CM

## 2016-10-12 MED ORDER — DULAGLUTIDE 0.75 MG/0.5ML ~~LOC~~ SOAJ: 0.7500 mg | SUBCUTANEOUS | 0 refills | Status: DC

## 2016-10-12 NOTE — Progress Notes (Signed)
Patient ID: [name], female   DOB: March 26, 1941, 75 y.o.   MRN: [id number]  Chief Complaint:   Chief Complaint  Patient presents with  . Diabetes    HPI:  Patient is referred by Dr Wendi Snipes for diabetes and Continuous glucose monitoring. There have been several medications changed for [name] in the last 2 weeks.  She was seen in hospital for constipation which was thought to be related to Enablex.  This has since been d/c and miralax started bid and she reports having more regular BM.  She does states that at times she has had nausea but related this to constipation as well. She is also still taking ABX for treatment of UTI - has 2 days left.   Diabetes: patient has long standing diabetes.  Her most recent A1C was 7.2% though she states that she has been getting BG reading in the 200's more frequently and has 1 or 2 readings in the 300's.  Current medications for diabetes include -  Trulicity 1.5mg  q week, Lantus 34 units daily and Novolog sliding scale (BG less than 80 - no insulin, 80 to 120 - 3 units, 121 to 150 - 5 units, 151 to 200 - 7 units, 201 to 250 - 8 units, 250 or above - 9 units and call office) Patient has tried metformin in past but stopped because she had low energy.  Once she stopped metformin her energy level improved but she is unsure if it really was metformin or not.  Current GFR = 39 Checks BG 3  times per day.  HBG ranges from 90 to 314 (no reports of hypoglycemia) Average BG is 175 Diet - Continues to try to limit calorie or CHO intake.  She states that Trulicity has helpsed  some with appetite but her appetite has especially been down while she was constiated.  F Current Outpatient Prescriptions:  .  aspirin 81 MG tablet, Take 81 mg by mouth daily.  , Disp: , Rfl:  .  atorvastatin (LIPITOR) 20 MG tablet, Take 0.5 tablets (10 mg total) by mouth at bedtime., Disp: 90 tablet, Rfl: 3 .  Calcium Carb-Cholecalciferol (CALCIUM 600+D3) 600-200 MG-UNIT TABS, Take  1 tablet by mouth 2 (two) times daily.  , Disp: , Rfl:  .  cephALEXin (KEFLEX) 250 MG capsule, Take 250 mg by mouth daily. To prevent urinary tract infections, Disp: , Rfl:  .  desonide (DESOWEN) 0.05 % ointment, Apply 1 application topically 2 (two) times daily. , Disp: , Rfl:  .  diclofenac sodium (VOLTAREN) 1 % GEL, Apply 4 g topically 4 (four) times daily., Disp: 100 g, Rfl: 3 .  esomeprazole (NEXIUM) 20 MG capsule, Take 1 capsule (20 mg total) by mouth daily at 12 noon., Disp: 90 capsule, Rfl: 3 .  estradiol (ESTRACE) 0.1 MG/GM vaginal cream, Place 2 g vaginally every Monday, Wednesday, and Friday. , Disp: , Rfl:  .  glucose blood (FREESTYLE LITE) test strip, Use qid to check blood sugar. Dx E11.9, Disp: 200 each, Rfl: 11 .  insulin aspart (NOVOLOG FLEXPEN) 100 UNIT/ML FlexPen, Inject prior to each meal per sliding scale.BG less than 80 - no insulin; 80 to 120 - 3 units; 121 to 150 - 5 units; 151 to 200 - 7 units; 201 to 250 - 8 units; 250 or above - 9 units and call office., Disp: 30 mL, Rfl: 3 .  Insulin Glargine (LANTUS SOLOSTAR) 100 UNIT/ML Solostar Pen, Inject 34 Units into the skin every morning.,  Disp: 45 mL, Rfl: 3 .  Insulin Pen Needle (NOVOFINE) 30G X 8 MM MISC, Use to inject insulin up to qid., Disp: 400 each, Rfl: 3 .  losartan (COZAAR) 25 MG tablet, Take 1 tablet (25 mg total) by mouth daily. (Patient taking differently: Take 12.5 mg by mouth daily. ), Disp: 90 tablet, Rfl: 3 .  metoprolol succinate (TOPROL-XL) 25 MG 24 hr tablet, Take 0.5 tablets (12.5 mg total) by mouth daily. (Patient taking differently: Take 12.5 mg by mouth every evening. ), Disp: 90 tablet, Rfl: 3 .  mirabegron ER (MYRBETRIQ) 50 MG TB24 tablet, Take 50 mg by mouth daily., Disp: , Rfl:  .  mometasone (NASONEX) 50 MCG/ACT nasal spray, Place 2 sprays into the nose daily., Disp: 17 g, Rfl: 12 .  Multiple Vitamin (MULTIVITAMIN) tablet, Take 0.5 tablets by mouth 2 (two) times daily. , Disp: , Rfl:  .  NYSTATIN  powder, APPLY UNDER BILATERAL BREASTS AND GROIN TWICE A DAY, Disp: 60 g, Rfl: 3 .  nystatin-triamcinolone ointment (MYCOLOG), Apply 1 application topically 2 (two) times daily. (Patient taking differently: Apply 1 application topically daily as needed. ), Disp: 30 g, Rfl: 2 .  Polyethyl Glycol-Propyl Glycol 0.4-0.3 % SOLN, Apply 1 drop to eye daily as needed (for dry eye relief). , Disp: , Rfl:  .  polyethylene glycol (MIRALAX / GLYCOLAX) packet, Take 17 g by mouth 2 (two) times daily., Disp: , Rfl:  .  Probiotic Product (PROBIOTIC ADVANCED PO), Take 1 capsule by mouth daily. , Disp: , Rfl:  .  traZODone (DESYREL) 150 MG tablet, Take 0.5 tablets (75 mg total) by mouth at bedtime., Disp: 90 tablet, Rfl: 3 .  triamcinolone (KENALOG) 0.025 % ointment, Apply 1 application topically 2 (two) times daily. (Patient taking differently: Apply 1 application topically daily as needed. ), Disp: 30 g, Rfl: 0 .  Dulaglutide (TRULICITY) 1.5 0000000 SOPN, Inject 0.75 mg into the skin every 7 (seven) days., Disp: 1 mL, Rfl: 0    Lab Results  Component Value Date   HGBA1C 7.3 09/29/2016   HGBA1C 6.5 12/18/2015      Lab Results  Component Value Date   CHOL 120 03/17/2016   HDL 60 03/17/2016   LDLCALC 40 03/17/2016   TRIG 100 03/17/2016   CHOLHDL 2.0 03/17/2016    There were no vitals filed for this visit.  Assessment: 1.  Diabetes - A1c is at goal but HBG reading are not 2.  Nausea - possibly worsened by Trulicity    Recommendations: 1.  Continuous Glucose monitor was placed today to try to get better understanding of BG trends.  Patient educated about proper care of CGM and site.  CGM was placed on back of upper left arm.  Patient will wear at least 5 days and up to 14 days.  He is to keep BG, diet and insulin administration records. He can engage in regular activities with special care when showering, changing clothes and swimming (only up to 3 feet and for 30 minutes at a time)  2.  Medication  recommendations at this time are as follows:      Continue Lantus 34 units daily    Decrease Trulicity to 0.75mg  SQ q week to see if nausea improved - gave #2   samples   Continue Novolog sliding scale   BG less than 80 - no insulin    80 to 120 - 3 units    121 to 150 - 5 units  151 to 200 - 7 units    201 to 250 - 8 units    250 or above - 9 units and call office  3.  Reviewed HBG goals:  Fasting 80-130 and 1-2 hour post prandial <180.  Patient is instructed to check BG 3 to 4 times per day.  4.  Continue with diet - limiting high CHO containing foods.   5.   Orders Placed This Encounter  Procedures  . DG [facility] DEXA    Standing Status:   Future    Standing Expiration Date:   12/13/2016    Order Specific Question:   Reason for Exam (SYMPTOM  OR DIAGNOSIS REQUIRED)    Answer:   postmenopausal female with adult fracture  . Microalbumin / creatinine urine ratio   RTC in 2 weeks to download CGM and discuss results   Cherre Robins, PharmD, CPP, CDE

## 2016-10-12 NOTE — Patient Instructions (Signed)
Continuous Glucose monitor was placed today to try to get better understanding of BG trends.    You will wear for up to 14 days.  Make sure to keep BG, diet and insulin administration records.  You can engage in regular activities with special care when showering, changing clothes and swimming (only up to 3 feet and for 30 minutes at a time)  Call me at [phone] if you have any questions or problems

## 2016-10-13 ENCOUNTER — Ambulatory Visit: Payer: Medicare Other | Admitting: Pharmacist

## 2016-10-13 LAB — MICROALBUMIN / CREATININE URINE RATIO
CREATININE, UR: 57 mg/dL
MICROALB/CREAT RATIO: 8.4 mg/g{creat} (ref 0.0–30.0)
Microalbumin, Urine: 4.8 ug/mL

## 2016-10-15 ENCOUNTER — Other Ambulatory Visit: Payer: Self-pay | Admitting: Pharmacist

## 2016-10-15 ENCOUNTER — Telehealth: Payer: Self-pay | Admitting: Family Medicine

## 2016-10-15 DIAGNOSIS — F32A Depression, unspecified: Secondary | ICD-10-CM

## 2016-10-15 DIAGNOSIS — F329 Major depressive disorder, single episode, unspecified: Secondary | ICD-10-CM

## 2016-10-15 MED ORDER — ONDANSETRON HCL 4 MG PO TABS: 4.0000 mg | ORAL_TABLET | Freq: Three times a day (TID) | ORAL | 0 refills | Status: DC | PRN

## 2016-10-15 NOTE — Telephone Encounter (Signed)
zofran sent, Pt should consider evaluation, hours are limited around the holidays.   This medication can add to her constipation.   Laroy Apple, MD [facility] 10/15/2016, 12:08 PM

## 2016-10-15 NOTE — Telephone Encounter (Signed)
Pt notified of RX 

## 2016-10-15 NOTE — Telephone Encounter (Signed)
Patient state that she has been nauseas for 3 days and would like something called into [facility]. Please advise and send back to [facility].

## 2016-10-19 NOTE — Progress Notes (Signed)
Subjective:    Patient ID: [name], female    DOB: 08/16/1941, 75 y.o.   MRN: [id number]  HPI 75 year old female who presents with constipation. It started with the urologist put her on Enablex. She has since been changed to Myrbetriq the symptoms persist. She is taking MiraLAX. She feels like she drinks plenty of fluids. She has chronic issues with bladder. She takes Keflex prophylactically. She was seen in the emergency room earlier this month with constipation. At that time urine culture was positive but she has not been treated for infection. She is also concerned about weight loss but she is now taking Trulicity for her diabetes and it could be that that is impacting her weight  Patient Active Problem List   Diagnosis Date Noted  . Diabetic neuropathy ([place]) 06/18/2016  . OAB (overactive bladder) 06/18/2016  . Radial scar of breast 06/04/2016  . Healthcare maintenance 01/19/2016  . Diabetic retinopathy ([place]) 09/16/2015  . Obesity (BMI 30-39.9) 07/04/2014  . Aneurysm of iliac artery ([place]) 01/07/2014  . GERD (gastroesophageal reflux disease) 03/23/2013  . Type 2 diabetes mellitus with insulin therapy ([place]) 03/23/2013  . Insomnia 03/23/2013  . Other malaise and fatigue 03/23/2013  . MITRAL VALVE PROLAPSE 07/17/2009  . Lichen planus [id number]  . Fibromyalgia 07/17/2009  . VAGINITIS 04/04/2009  . Urinary incontinence 03/14/2009  . CHRONIC ULCER OF OTHER SPECIFIED SITE 12/24/2008  . Irritable bowel syndrome 08/29/2008  . BACK PAIN, LUMBAR 07/17/2008  . ACUTE CYSTITIS 01/24/2008  . UNSPECIFIED OSTEOPOROSIS 01/10/2008  . Dyslipidemia associated with type 2 diabetes mellitus ([place]) 05/22/2007  . Generalized anxiety disorder 05/22/2007  . Essential hypertension 05/22/2007   Outpatient Encounter Prescriptions as of 10/20/2016  Medication Sig  . aspirin 81 MG tablet Take 81 mg by mouth daily.    Marland Kitchen atorvastatin (LIPITOR) 20 MG tablet Take 0.5 tablets (10 mg total) by mouth at  bedtime.  . Calcium Carb-Cholecalciferol (CALCIUM 600+D3) 600-200 MG-UNIT TABS Take 1 tablet by mouth 2 (two) times daily.    . cephALEXin (KEFLEX) 250 MG capsule Take 250 mg by mouth daily. To prevent urinary tract infections  . desonide (DESOWEN) 0.05 % ointment Apply 1 application topically 2 (two) times daily.   . diclofenac sodium (VOLTAREN) 1 % GEL Apply 4 g topically 4 (four) times daily.  . Dulaglutide (TRULICITY) A999333 0000000 SOPN Inject 0.75 mg into the skin every 7 (seven) days.  Marland Kitchen escitalopram (LEXAPRO) 20 MG tablet TAKE ONE TABLET BY MOUTH DAILY  . esomeprazole (NEXIUM) 20 MG capsule Take 1 capsule (20 mg total) by mouth daily at 12 noon.  Marland Kitchen estradiol (ESTRACE) 0.1 MG/GM vaginal cream Place 2 g vaginally every Monday, Wednesday, and Friday.   Marland Kitchen glucose blood (FREESTYLE LITE) test strip Use qid to check blood sugar. Dx E11.9  . insulin aspart (NOVOLOG FLEXPEN) 100 UNIT/ML FlexPen Inject prior to each meal per sliding scale.BG less than 80 - no insulin; 80 to 120 - 3 units; 121 to 150 - 5 units; 151 to 200 - 7 units; 201 to 250 - 8 units; 250 or above - 9 units and call office.  . Insulin Glargine (LANTUS SOLOSTAR) 100 UNIT/ML Solostar Pen Inject 34 Units into the skin every morning.  . Insulin Pen Needle (NOVOFINE) 30G X 8 MM MISC Use to inject insulin up to qid.  Marland Kitchen losartan (COZAAR) 25 MG tablet Take 1 tablet (25 mg total) by mouth daily. (Patient taking differently: Take 12.5 mg by mouth daily. )  .  metoprolol succinate (TOPROL-XL) 25 MG 24 hr tablet Take 0.5 tablets (12.5 mg total) by mouth daily. (Patient taking differently: Take 12.5 mg by mouth every evening. )  . mirabegron ER (MYRBETRIQ) 50 MG TB24 tablet Take 50 mg by mouth daily.  . mometasone (NASONEX) 50 MCG/ACT nasal spray Place 2 sprays into the nose daily.  . Multiple Vitamin (MULTIVITAMIN) tablet Take 0.5 tablets by mouth 2 (two) times daily.   . NYSTATIN powder APPLY UNDER BILATERAL BREASTS AND GROIN TWICE A DAY  .  nystatin-triamcinolone ointment (MYCOLOG) Apply 1 application topically 2 (two) times daily. (Patient taking differently: Apply 1 application topically daily as needed. )  . ondansetron (ZOFRAN) 4 MG tablet Take 1 tablet (4 mg total) by mouth every 8 (eight) hours as needed for nausea or vomiting.  Vladimir Faster Glycol-Propyl Glycol 0.4-0.3 % SOLN Apply 1 drop to eye daily as needed (for dry eye relief).   . polyethylene glycol (MIRALAX / GLYCOLAX) packet Take 17 g by mouth 2 (two) times daily.  . Probiotic Product (PROBIOTIC ADVANCED PO) Take 1 capsule by mouth daily.   . traZODone (DESYREL) 150 MG tablet Take 0.5 tablets (75 mg total) by mouth at bedtime.  . triamcinolone (KENALOG) 0.025 % ointment Apply 1 application topically 2 (two) times daily. (Patient taking differently: Apply 1 application topically daily as needed. )   No facility-administered encounter medications on file as of 10/20/2016.        Review of Systems  Gastrointestinal: Positive for constipation.  Genitourinary: Positive for urgency.       Objective:   Physical Exam  Constitutional: She appears well-developed and well-nourished.  Cardiovascular: Normal rate.   Pulmonary/Chest: Effort normal and breath sounds normal.  Abdominal: Soft. Bowel sounds are normal.  Psychiatric: She has a normal mood and affect. Her behavior is normal.   BP 123/70   Pulse 80   Temp 97.8 F (36.6 C) (Oral)   Ht 5\' 3"  (1.6 m)   Wt 188 lb 6.4 oz (85.5 kg)   BMI 33.37 kg/m         Assessment & Plan:  1. Other depression Continue Lexapro  Wardell Honour MDntinue Lexapro - escitalopram (LEXAPRO) 20 MG tablet; Take 1 tablet (20 mg total) by mouth daily.  Dispense: 90 tablet; Refill: 3  2. Fibromyalgia Continue with  3. Irritable bowel syndrome with constipation I have suggested she take senna S and titrate the dose. Stop MiraLAX. She may follow up with GI doctors and she is due for colonoscopy. We might also consider trial  of new her med for IBS with constipation  Wardell Honour MD

## 2016-10-20 ENCOUNTER — Ambulatory Visit (INDEPENDENT_AMBULATORY_CARE_PROVIDER_SITE_OTHER): Payer: Medicare Other | Admitting: Family Medicine

## 2016-10-20 ENCOUNTER — Encounter: Payer: Self-pay | Admitting: Family Medicine

## 2016-10-20 VITALS — BP 123/70 | HR 80 | Temp 97.8°F | Ht 63.0 in | Wt 188.4 lb

## 2016-10-20 DIAGNOSIS — K581 Irritable bowel syndrome with constipation: Secondary | ICD-10-CM | POA: Diagnosis not present

## 2016-10-20 DIAGNOSIS — M797 Fibromyalgia: Secondary | ICD-10-CM

## 2016-10-20 DIAGNOSIS — F3289 Other specified depressive episodes: Secondary | ICD-10-CM

## 2016-10-20 MED ORDER — ESCITALOPRAM OXALATE 20 MG PO TABS: 20.0000 mg | ORAL_TABLET | Freq: Every day | ORAL | 3 refills | Status: DC

## 2016-10-20 MED ORDER — NITROFURANTOIN MONOHYD MACRO 100 MG PO CAPS: 100.0000 mg | ORAL_CAPSULE | Freq: Two times a day (BID) | ORAL | 0 refills | Status: DC

## 2016-10-26 ENCOUNTER — Ambulatory Visit: Payer: Self-pay | Admitting: Pharmacist

## 2016-10-27 ENCOUNTER — Encounter: Payer: Self-pay | Admitting: Family Medicine

## 2016-10-28 ENCOUNTER — Encounter: Payer: Self-pay | Admitting: Pediatrics

## 2016-10-28 ENCOUNTER — Ambulatory Visit (INDEPENDENT_AMBULATORY_CARE_PROVIDER_SITE_OTHER): Payer: Medicare Other | Admitting: Pediatrics

## 2016-10-28 VITALS — BP 124/69 | HR 91 | Temp 98.1°F | Resp 20 | Ht 63.0 in | Wt 188.8 lb

## 2016-10-28 DIAGNOSIS — R0981 Nasal congestion: Secondary | ICD-10-CM | POA: Diagnosis not present

## 2016-10-28 DIAGNOSIS — J069 Acute upper respiratory infection, unspecified: Secondary | ICD-10-CM | POA: Diagnosis not present

## 2016-10-28 DIAGNOSIS — R062 Wheezing: Secondary | ICD-10-CM | POA: Diagnosis not present

## 2016-10-28 MED ORDER — SPACER/AERO CHAMBER MOUTHPIECE MISC: 1.0000 | Freq: Four times a day (QID) | 0 refills | Status: DC | PRN

## 2016-10-28 MED ORDER — AZITHROMYCIN 250 MG PO TABS: ORAL_TABLET | ORAL | 0 refills | Status: DC

## 2016-10-28 MED ORDER — ALBUTEROL SULFATE HFA 108 (90 BASE) MCG/ACT IN AERS: 2.0000 | INHALATION_SPRAY | Freq: Four times a day (QID) | RESPIRATORY_TRACT | 0 refills | Status: DC | PRN

## 2016-10-28 MED ORDER — FLUTICASONE PROPIONATE 50 MCG/ACT NA SUSP: 2.0000 | Freq: Every day | NASAL | 6 refills | Status: AC

## 2016-10-28 NOTE — Progress Notes (Signed)
  Subjective:   Patient ID: [name], female    DOB: 04-06-1941, 76 y.o.   MRN: [id number] CC: Cough; Chest congestion; and Nasal Congestion  HPI: [name] is a 76 y.o. female presenting for Cough; Chest congestion; and Nasal Congestion  Loose stools x 2 this morning, didn't make it to bathroom first time Temp yesterday 100.7 abd pain off and on Chest congestion, wheezing, sinus congestion bothering her Decreased appetite Drinking fine  Ongoing problems with constipation Has caused abd to be tender Tenderness/sensitivity is no worse than usual Diarrhea is unusual for her  BGLs have been higher than usual Over 150-180s past few days  Relevant past medical, surgical, family and social history reviewed. Allergies and medications reviewed and updated. History  Smoking Status  . Never Smoker  Smokeless Tobacco  . Never Used   ROS: Per HPI   Objective:    BP 124/69   Pulse 91   Temp 98.1 F (36.7 C) (Oral)   Resp 20   Ht 5\' 3"  (1.6 m)   Wt 188 lb 12.8 oz (85.6 kg)   SpO2 99%   BMI 33.44 kg/m   Wt Readings from Last 3 Encounters:  10/28/16 188 lb 12.8 oz (85.6 kg)  10/20/16 188 lb 6.4 oz (85.5 kg)  09/29/16 194 lb 6.4 oz (88.2 kg)    Gen: NAD, alert, cooperative with exam, NCAT EYES: EOMI, no conjunctival injection, or no icterus ENT:  TMs pearly gray on visible portion, obscurred by cerumen, OP without erythema. TTP over max sinuses b/l LYMPH: no cervical LAD CV: NRRR, normal S1/S2, no murmur Resp: slight wheeze with forced exhalation b/l, no crackles, normal WOB Abd: +BS, soft, mildly tender with palpation throughout, ND. no guarding or organomegaly Ext: No edema, warm Neuro: Alert and oriented MSK: normal muscle bulk  Assessment & Plan:  Polina was seen today for cough, chest congestion and nasal congestion.  Diagnoses and all orders for this visit:  Acute URI Discussed symptomatic care  Wheezing -     albuterol (PROVENTIL HFA;VENTOLIN HFA) 108  (90 Base) MCG/ACT inhaler; Inhale 2 puffs into the lungs every 6 (six) hours as needed for wheezing or shortness of breath. -     Spacer/Aero Chamber Mouthpiece MISC; 1 each by Does not apply route every 6 (six) hours as needed. -     azithromycin (ZITHROMAX) 250 MG tablet; Take 2 the first day and then one each day after.  Sinus congestion -     fluticasone (FLONASE) 50 MCG/ACT nasal spray; Place 2 sprays into both nostrils daily.  DM2 BGLs slightly elevated compared with normal, likely due to current illness Continues to check regularly  Follow up plan: Return if symptoms worsen or fail to improve. Assunta Found, MD [place]

## 2016-11-01 ENCOUNTER — Other Ambulatory Visit: Payer: Self-pay | Admitting: Family

## 2016-11-01 ENCOUNTER — Ambulatory Visit (INDEPENDENT_AMBULATORY_CARE_PROVIDER_SITE_OTHER): Payer: Medicare Other | Admitting: Ophthalmology

## 2016-11-01 DIAGNOSIS — B372 Candidiasis of skin and nail: Secondary | ICD-10-CM

## 2016-11-01 DIAGNOSIS — N760 Acute vaginitis: Secondary | ICD-10-CM

## 2016-11-04 ENCOUNTER — Encounter: Payer: Self-pay | Admitting: Family

## 2016-11-04 ENCOUNTER — Ambulatory Visit (INDEPENDENT_AMBULATORY_CARE_PROVIDER_SITE_OTHER): Payer: Medicare Other | Admitting: Family

## 2016-11-04 ENCOUNTER — Ambulatory Visit (INDEPENDENT_AMBULATORY_CARE_PROVIDER_SITE_OTHER): Payer: Medicare Other | Admitting: Pharmacist

## 2016-11-04 VITALS — BP 139/70 | HR 77 | Temp 98.2°F | Ht 63.0 in | Wt 186.5 lb

## 2016-11-04 DIAGNOSIS — J209 Acute bronchitis, unspecified: Secondary | ICD-10-CM

## 2016-11-04 DIAGNOSIS — N183 Chronic kidney disease, stage 3 unspecified: Secondary | ICD-10-CM

## 2016-11-04 DIAGNOSIS — Z794 Long term (current) use of insulin: Secondary | ICD-10-CM

## 2016-11-04 DIAGNOSIS — R05 Cough: Secondary | ICD-10-CM

## 2016-11-04 DIAGNOSIS — E1122 Type 2 diabetes mellitus with diabetic chronic kidney disease: Secondary | ICD-10-CM

## 2016-11-04 DIAGNOSIS — R059 Cough, unspecified: Secondary | ICD-10-CM

## 2016-11-04 LAB — VERITOR FLU A/B WAIVED
Influenza A: NEGATIVE
Influenza B: NEGATIVE

## 2016-11-04 MED ORDER — BENZONATATE 200 MG PO CAPS: 200.0000 mg | ORAL_CAPSULE | Freq: Three times a day (TID) | ORAL | 1 refills | Status: DC | PRN

## 2016-11-04 MED ORDER — LEVOFLOXACIN 500 MG PO TABS: 500.0000 mg | ORAL_TABLET | Freq: Every day | ORAL | 0 refills | Status: DC

## 2016-11-04 MED ORDER — DULAGLUTIDE 1.5 MG/0.5ML ~~LOC~~ SOAJ: 1.5000 mg | SUBCUTANEOUS | 0 refills | Status: DC

## 2016-11-04 MED ORDER — INSULIN GLARGINE 100 UNIT/ML SOLOSTAR PEN: 36.0000 [IU] | PEN_INJECTOR | SUBCUTANEOUS | 3 refills | Status: DC

## 2016-11-04 NOTE — Progress Notes (Signed)
Patient came in today to download continuous glucose monitor and review results.  However, she c/o feeling sweaty / feverish, congested and having cough despite ABX therapy last week.  She was triaged to see Evelina Dun, NP  I did download and review result from CGM with her.   Average BG was 181  Estimated A1c = 7.9% No hypoglycemic events noted on report Most days BG was over 154, 75% of time.   Patient was advised to increase Trulicity to 1.5mg  SQ weekly Increase Lantus to 36 units once daily.  RTC in 6 weeks to see PCP and 9 weeks to see me for AWV.

## 2016-11-04 NOTE — Patient Instructions (Signed)

## 2016-11-04 NOTE — Progress Notes (Signed)
   Subjective:    Patient ID: [name], female    DOB: 12/26/1940, 76 y.o.   MRN: [id number]  Cough  This is a recurrent problem. The current episode started 1 to 4 weeks ago. The problem has been waxing and waning. The problem occurs every few minutes. The cough is productive of sputum (clear). Associated symptoms include chills, a fever, myalgias, nasal congestion, postnasal drip, rhinorrhea and wheezing. Pertinent negatives include no ear congestion, ear pain or headaches. The symptoms are aggravated by lying down. She has tried rest and OTC cough suppressant (zpak, albuterol) for the symptoms. The treatment provided mild relief. There is no history of asthma or COPD.      Review of Systems  Constitutional: Positive for chills and fever.  HENT: Positive for postnasal drip and rhinorrhea. Negative for ear pain.   Respiratory: Positive for cough and wheezing.   Musculoskeletal: Positive for myalgias.  Neurological: Negative for headaches.  All other systems reviewed and are negative.      Objective:   Physical Exam  Constitutional: She is oriented to person, place, and time. She appears well-developed and well-nourished. No distress.  HENT:  Head: Normocephalic and atraumatic.  Right Ear: External ear normal.  Left Ear: External ear normal.  Nose: Rhinorrhea present.  Mouth/Throat: Oropharynx is clear and moist.  Eyes: Pupils are equal, round, and reactive to light.  Neck: Normal range of motion. Neck supple. No thyromegaly present.  Cardiovascular: Normal rate, regular rhythm, normal heart sounds and intact distal pulses.   No murmur heard. Pulmonary/Chest: Effort normal. No respiratory distress. She has wheezes in the right middle field and the left middle field. She has rhonchi.  Abdominal: Soft. Bowel sounds are normal. She exhibits no distension. There is no tenderness.  Musculoskeletal: Normal range of motion. She exhibits no edema or tenderness.  Neurological: She is  alert and oriented to person, place, and time. She has normal reflexes. No cranial nerve deficit.  Skin: Skin is warm and dry.  Psychiatric: She has a normal mood and affect. Her behavior is normal. Judgment and thought content normal.  Vitals reviewed.     BP 139/70   Pulse 77   Temp 98.2 F (36.8 C) (Oral)   Ht 5\' 3"  (1.6 m)   Wt 186 lb 8 oz (84.6 kg)   BMI 33.04 kg/m      Assessment & Plan:  1. Cough - Veritor Flu A/B Waived  2. Acute bronchitis, unspecified organism - Take meds as prescribed - Use a cool mist humidifier  -Use saline nose sprays frequently -Saline irrigations of the nose can be very helpful if done frequently.  * 4X daily for 1 week*  * Use of a nettie pot can be helpful with this. Follow directions with this* -Force fluids -For any cough or congestion  Use plain Mucinex- regular strength or max strength is fine   * Children- consult with Pharmacist for dosing -For fever or aces or pains- take tylenol or ibuprofen appropriate for age and weight.  * for fevers greater than 101 orally you may alternate ibuprofen and tylenol every  3 hours. -Throat lozenges if help - levofloxacin (LEVAQUIN) 500 MG tablet; Take 1 tablet (500 mg total) by mouth daily.  Dispense: 7 tablet; Refill: 0 - benzonatate (TESSALON) 200 MG capsule; Take 1 capsule (200 mg total) by mouth 3 (three) times daily as needed.  Dispense: 30 capsule; Refill: [place], FNP

## 2016-11-08 ENCOUNTER — Telehealth: Payer: Self-pay | Admitting: Family Medicine

## 2016-11-08 MED ORDER — DULAGLUTIDE 1.5 MG/0.5ML ~~LOC~~ SOAJ: 1.5000 mg | SUBCUTANEOUS | 1 refills | Status: DC

## 2016-11-08 NOTE — Telephone Encounter (Signed)
PATIETN REQUESTED RX FOR TRULICITY PRINTED SO SHE CAN SEND TO [facility].  RX PRINTED AND SIGNED BY PCP.

## 2016-11-25 ENCOUNTER — Ambulatory Visit (HOSPITAL_COMMUNITY)
Admission: RE | Admit: 2016-11-25 | Discharge: 2016-11-25 | Disposition: A | Discharge status: Home / Self Care | Payer: Medicare Other | Source: Ambulatory Visit | Attending: Orthopaedic Surgery | Admitting: Orthopaedic Surgery

## 2016-11-25 DIAGNOSIS — M456 Ankylosing spondylitis lumbar region: Secondary | ICD-10-CM | POA: Diagnosis not present

## 2016-11-25 DIAGNOSIS — M48061 Spinal stenosis, lumbar region without neurogenic claudication: Secondary | ICD-10-CM | POA: Diagnosis not present

## 2016-11-25 DIAGNOSIS — M4807 Spinal stenosis, lumbosacral region: Secondary | ICD-10-CM | POA: Insufficient documentation

## 2016-11-25 DIAGNOSIS — M47896 Other spondylosis, lumbar region: Secondary | ICD-10-CM | POA: Insufficient documentation

## 2016-12-13 DIAGNOSIS — R351 Nocturia: Secondary | ICD-10-CM | POA: Diagnosis not present

## 2016-12-13 DIAGNOSIS — N3020 Other chronic cystitis without hematuria: Secondary | ICD-10-CM | POA: Diagnosis not present

## 2016-12-13 DIAGNOSIS — N3946 Mixed incontinence: Secondary | ICD-10-CM | POA: Diagnosis not present

## 2016-12-23 ENCOUNTER — Ambulatory Visit (INDEPENDENT_AMBULATORY_CARE_PROVIDER_SITE_OTHER): Payer: Medicare Other | Admitting: Orthopaedic Surgery

## 2016-12-28 ENCOUNTER — Ambulatory Visit: Payer: Medicare Other | Admitting: Family Medicine

## 2017-01-04 ENCOUNTER — Encounter: Payer: Self-pay | Admitting: Family Medicine

## 2017-01-04 ENCOUNTER — Ambulatory Visit (INDEPENDENT_AMBULATORY_CARE_PROVIDER_SITE_OTHER): Payer: Medicare Other | Admitting: Family Medicine

## 2017-01-04 VITALS — BP 102/58 | HR 71 | Temp 97.3°F | Ht 63.0 in | Wt 189.6 lb

## 2017-01-04 DIAGNOSIS — E1142 Type 2 diabetes mellitus with diabetic polyneuropathy: Secondary | ICD-10-CM | POA: Diagnosis not present

## 2017-01-04 DIAGNOSIS — F3289 Other specified depressive episodes: Secondary | ICD-10-CM | POA: Diagnosis not present

## 2017-01-04 DIAGNOSIS — E785 Hyperlipidemia, unspecified: Secondary | ICD-10-CM | POA: Diagnosis not present

## 2017-01-04 DIAGNOSIS — E119 Type 2 diabetes mellitus without complications: Secondary | ICD-10-CM

## 2017-01-04 DIAGNOSIS — Z794 Long term (current) use of insulin: Secondary | ICD-10-CM

## 2017-01-04 DIAGNOSIS — I10 Essential (primary) hypertension: Secondary | ICD-10-CM

## 2017-01-04 DIAGNOSIS — E1122 Type 2 diabetes mellitus with diabetic chronic kidney disease: Secondary | ICD-10-CM | POA: Diagnosis not present

## 2017-01-04 DIAGNOSIS — N183 Chronic kidney disease, stage 3 unspecified: Secondary | ICD-10-CM | POA: Insufficient documentation

## 2017-01-04 DIAGNOSIS — E1169 Type 2 diabetes mellitus with other specified complication: Secondary | ICD-10-CM | POA: Diagnosis not present

## 2017-01-04 LAB — BAYER DCA HB A1C WAIVED: HB A1C: 6.9 % (ref <7.0)

## 2017-01-04 MED ORDER — ESCITALOPRAM OXALATE 20 MG PO TABS: 20.0000 mg | ORAL_TABLET | Freq: Every day | ORAL | 3 refills | Status: DC

## 2017-01-04 MED ORDER — TRAZODONE HCL 150 MG PO TABS: 75.0000 mg | ORAL_TABLET | Freq: Every day | ORAL | 3 refills | Status: DC

## 2017-01-04 MED ORDER — INSULIN GLARGINE 100 UNIT/ML SOLOSTAR PEN: 32.0000 [IU] | PEN_INJECTOR | SUBCUTANEOUS | 3 refills | Status: DC

## 2017-01-04 NOTE — Progress Notes (Signed)
   HPI  Patient presents today here for follow diabetes and other chronic medical conditions.  Diabetes Patient has a detailed blood glucose log with her today, over the last month or 2 her blood sugars have been improving steadily before meals, averaging 120-160, she has had a few blood sugars as low as 54, 65, and 79. Patient is taking Lantus 36 units daily, plus sliding scale NovoLog, plus now is taking weekly GLP, she is tolerating it well  Patient does have some intermittent foot numbness and tingling which is not severely bothersome. We discussed her chronic kidney disease.  She is taking atorvastatin without side effect.  Lexapro was also helpful, she needs a refill of this and trazodone printed on paper to take to the [place].  PMH: Smoking status noted ROS: Per HPI  Objective: BP (!) 102/58   Pulse 71   Temp 97.3 F (36.3 C) (Oral)   Ht '5\' 3"'$  (1.6 m)   Wt 189 lb 9.6 oz (86 kg)   BMI 33.59 kg/m  Gen: NAD, alert, cooperative with exam HEENT: NCAT, EOMI, PERRL CV: RRR, good S1/S2, no murmur Resp: CTABL, no wheezes, non-labored Ext: No edema, warm Neuro: Alert and oriented, No gross deficits  Diabetic Foot Exam - Simple   Simple Foot Form Diabetic Foot exam was performed with the following findings:  Yes 01/04/2017  5:18 PM  Visual Inspection No deformities, no ulcerations, no other skin breakdown bilaterally:  Yes Sensation Testing See comments:  Yes Pulse Check Posterior Tibialis and Dorsalis pulse intact bilaterally:  Yes Comments senstaion intact to monofilament on bilateral forefoot, however decreased on the heel bilaterally      Assessment and plan:  # Type 2 diabetes Trolamine improving, A1c 6.9 Now patient with some borderline hypoglycemia Reduce Lantus to 32 units once daily, continue sliding scale and GLP   # Diabetic neuropathy Stable  # CKD stage III Stable Labs Avoid NSAIDs, xcontiinue ARB  # HTN DC Metop, low today with some  dizziness   Depression  Stable, refill lexapro  HLD Continue statin Labs- non fasting    Orders Placed This Encounter  Procedures  . Bayer DCA Hb A1c Waived  . CMP14+EGFR  . CBC with Differential/Platelet  . LDL Cholesterol, Direct    Meds ordered this encounter  Medications  . Insulin Glargine (LANTUS SOLOSTAR) 100 UNIT/ML Solostar Pen    Sig: Inject 32 Units into the skin every morning.    Dispense:  45 mL    Refill:  3  . escitalopram (LEXAPRO) 20 MG tablet    Sig: Take 1 tablet (20 mg total) by mouth daily.    Dispense:  90 tablet    Refill:  3    This prescription was filled on 10/15/2016. Any refills authorized will be placed on file.  Marland Kitchen DISCONTD: traZODone (DESYREL) 150 MG tablet    Sig: Take 0.5 tablets (75 mg total) by mouth at bedtime.    Dispense:  45 tablet    Refill:  3  . traZODone (DESYREL) 150 MG tablet    Sig: Take 0.5 tablets (75 mg total) by mouth at bedtime.    Dispense:  45 tablet    Refill:  [place], MD [place] 01/04/2017, 5:16 PM

## 2017-01-04 NOTE — Patient Instructions (Addendum)
Great to see you!  Lets stop metoprolol with you rlow BP and dizziness  Decrease lantus to 32 units daily

## 2017-01-06 ENCOUNTER — Encounter (INDEPENDENT_AMBULATORY_CARE_PROVIDER_SITE_OTHER): Payer: Self-pay | Admitting: Orthopaedic Surgery

## 2017-01-06 ENCOUNTER — Ambulatory Visit: Payer: Medicare Other | Admitting: Family Medicine

## 2017-01-06 ENCOUNTER — Ambulatory Visit (INDEPENDENT_AMBULATORY_CARE_PROVIDER_SITE_OTHER): Payer: Medicare Other | Admitting: Orthopaedic Surgery

## 2017-01-06 VITALS — BP 115/60 | HR 75 | Ht 63.5 in | Wt 188.0 lb

## 2017-01-06 DIAGNOSIS — M47816 Spondylosis without myelopathy or radiculopathy, lumbar region: Secondary | ICD-10-CM

## 2017-01-06 NOTE — Progress Notes (Signed)
Office Visit Note   Patient: [name]           Date of Birth: 02/14/1941           MRN: [id number] Visit Date: 01/06/2017              Requested by: Timmothy Euler, MD [place], [place] [address] PCP: Kenn File, MD   Assessment & Plan: Visit Diagnoses:  1. Lumbar spondylosis        Lumbar facet degenerative changes most significant at L3-4. She does not have any moderate or high-grade lateral recess or central stenosis. No significant foraminal stenosis.  Plan: We reviewed the MRI scan and gave her copy of the report. She'll work on a walking program work on [employer] loss to help with her diabetes as well as hypertension overall health. Walking should help with her back pain symptoms. She can do some gentle stretching exercises and then incorporate some core strengthening. Office follow-up as needed.  Follow-Up Instructions: Return in about 4 weeks (around 02/03/2017), or if symptoms worsen or fail to improve.   Orders:  No orders of the defined types were placed in this encounter.  No orders of the defined types were placed in this encounter.     Procedures: No procedures performed   Clinical Data: No additional findings.   Subjective: Chief Complaint  Patient presents with  . Lower Back - Pain    Patient returns to review MRI Lumbar Spine.   Patient's had chronic back pain dating back at least more than a year. X-ray shows some thoracic spine spondylosis. She had lumbar spurring noted on plain radiographs. She is a diabetic with peripheral neuropathy. Patient takes insulin and also has been on Neurontin in the past. She's taken Effexor, Lexapro, Cymbalta. Currently she is taking Desyrel at night.  Review of Systems  Constitutional: Negative for chills and diaphoresis.  HENT: Negative for ear discharge, ear pain and nosebleeds.   Eyes: Negative for discharge and visual disturbance.  Respiratory: Negative for cough, choking and shortness of  breath.   Cardiovascular: Negative for chest pain and palpitations.       Positive for hypertension and mitral valve prolapse.  Gastrointestinal: Negative for abdominal distention and abdominal pain.  Endocrine: Negative for cold intolerance and heat intolerance.       Type 2 diabetes on insulin  Genitourinary: Negative for flank pain and hematuria.       History of cystitis.  Musculoskeletal:       Positive for chronic low back pain.  Skin: Negative for rash and wound.  Neurological: Negative for seizures and speech difficulty.       History of chronic low back pain.  Hematological: Negative for adenopathy. Does not bruise/bleed easily.  Psychiatric/Behavioral: Negative for agitation and suicidal ideas.     Objective: Vital Signs: BP 115/60   Pulse 75   Ht 5' 3.5" (1.613 m)   Wt 188 lb (85.3 kg)   BMI 32.78 kg/m   Physical Exam  Constitutional: She is oriented to person, place, and time. She appears well-developed.  HENT:  Head: Normocephalic.  Right Ear: External ear normal.  Left Ear: External ear normal.  Eyes: Pupils are equal, round, and reactive to light.  Neck: No tracheal deviation present. No thyromegaly present.  Cardiovascular: Normal rate.   Pulmonary/Chest: Effort normal.  Abdominal: Soft.  Musculoskeletal:  Patient has intact reflexes. Hip range of motion is normal. Negative Faber test. tenderness. Collateral ligaments  range of motion is normal to the knees. Good hip strength no isolated motor weakness resisted plantar flexion dorsiflexion of her foot is normal reflexes are 2+. She complains of pain with straight leg raising at 90 both right and left.  Neurological: She is alert and oriented to person, place, and time.  Skin: Skin is warm and dry.  Psychiatric: She has a normal mood and affect. Her behavior is normal.    Ortho Exam  Specialty Comments:  No specialty comments available.  Imaging: Show images for MR Lumbar Spine w/o contrast  Study  Result   CLINICAL DATA:  76 y/o  F; lower back pain radiating to both legs.  EXAM: MRI LUMBAR SPINE WITHOUT CONTRAST  TECHNIQUE: Multiplanar, multisequence MR imaging of the lumbar spine was performed. No intravenous contrast was administered.  COMPARISON:  09/24/2016 CT abdomen and pelvis.  FINDINGS: Segmentation:  Standard.  Alignment:  Physiologic.  Vertebrae: No fracture, evidence of discitis, or bone lesion. L3-4 mild degenerative endplate edema and small right-sided facet effusion.  Conus medullaris: Extends to the L1-2 level and appears normal. S3 right-sided Tarlov cyst.  Paraspinal and other soft tissues: Negative.  Disc levels:  L1-2: No significant disc displacement, foraminal narrowing, or canal stenosis.  L2-3: Minimal disc bulge. No significant foraminal narrowing or canal stenosis.  L3-4: Moderate disc bulge with mild facet and ligamentum flavum hypertrophy. Mild foraminal and lateral recess narrowing. No significant canal stenosis.  L4-5: Small disc bulge with mild facet and moderate ligamentum flavum hypertrophy. Mild bilateral foraminal and lateral recess narrowing. No significant canal stenosis.  L5-S1: Small disc bulge with moderate right-greater-than-left facet hypertrophy. Mild right greater than left foraminal narrowing. No significant canal stenosis.  IMPRESSION: 1. No acute osseous abnormality. 2. Lumbar spondylosis with discogenic and facet degenerative changes greatest at L3-4. 3. Mild foraminal narrowing from the L3 through S1 levels. 4. No high-grade foraminal narrowing or canal stenosis.   Electronically Signed   By: Kristine Garbe M.D.   On: 11/25/2016 14:36       PMFS History: Patient Active Problem List   Diagnosis Date Noted  . CKD (chronic kidney disease) stage 3, GFR 30-59 ml/min 01/04/2017  . Diabetic neuropathy ([place]) 06/18/2016  . OAB (overactive bladder) 06/18/2016  . Radial scar of  breast 06/04/2016  . Healthcare maintenance 01/19/2016  . Diabetic retinopathy ([place]) 09/16/2015  . Obesity (BMI 30-39.9) 07/04/2014  . Aneurysm of iliac artery ([place]) 01/07/2014  . GERD (gastroesophageal reflux disease) 03/23/2013  . Type 2 diabetes mellitus with insulin therapy ([place]) 03/23/2013  . Insomnia 03/23/2013  . Other malaise and fatigue 03/23/2013  . MITRAL VALVE PROLAPSE 07/17/2009  . Lichen planus 58/06/9832  . Fibromyalgia 07/17/2009  . VAGINITIS 04/04/2009  . Urinary incontinence 03/14/2009  . CHRONIC ULCER OF OTHER SPECIFIED SITE 12/24/2008  . Irritable bowel syndrome 08/29/2008  . BACK PAIN, LUMBAR 07/17/2008  . ACUTE CYSTITIS 01/24/2008  . UNSPECIFIED OSTEOPOROSIS 01/10/2008  . Dyslipidemia associated with type 2 diabetes mellitus ([place]) 05/22/2007  . Generalized anxiety disorder 05/22/2007  . Essential hypertension 05/22/2007   Past Medical History:  Diagnosis Date  . Acute cystitis   . Depression   . Diabetes mellitus type II   . Diabetic retinopathy   . DM type 2 causing CKD stage 3 ([place])   . Fibromyalgia   . GERD (gastroesophageal reflux disease)   . Hyperlipidemia   . Hypertension   . IBS (irritable bowel syndrome)   . IBS (irritable bowel syndrome)   .  Insomnia   . Lichen planus    vulvar  . Pancreatitis, gallstone   . Postmenopausal   . Radial scar of breast 06/04/2016  . Urge incontinence of urine     Family History  Problem Relation Age of Onset  . Diabetes Mother   . Hypertension Mother   . Kidney disease Mother   . Heart disease Mother   . Peripheral vascular disease Mother     amputation  . Cancer Father     prostate  . Diabetes Father   . Heart disease Father     CABG  . Hypertension Father   . Heart attack Father   . Depression Sister   . Hypertension Sister     Past Surgical History:  Procedure Laterality Date  . ANKLE SURGERY  02/2004  . BREAST LUMPECTOMY WITH RADIOACTIVE SEED LOCALIZATION Left 01/22/2016   Procedure: LEFT  BREAST LUMPECTOMY WITH RADIOACTIVE SEED LOCALIZATION;  Surgeon: Autumn Messing III, MD;  Location: [facility];  Service: General;  Laterality: Left;  . CATARACT EXTRACTION W/ INTRAOCULAR LENS IMPLANT  09/2003, 12/2003  . CHOLECYSTECTOMY  2004  . EYE SURGERY     laser treatments  . JOINT REPLACEMENT    . TOTAL KNEE ARTHROPLASTY     Left  . TRIGGER FINGER RELEASE Left 01/30/2002  . TUBAL LIGATION  1970   Social History   Occupational History  . retired Retired   Social History Main Topics  . Smoking status: Never Smoker  . Smokeless tobacco: Never Used  . Alcohol use No  . Drug use: No  . Sexual activity: No

## 2017-01-19 ENCOUNTER — Ambulatory Visit (INDEPENDENT_AMBULATORY_CARE_PROVIDER_SITE_OTHER): Payer: Medicare Other | Admitting: Pharmacist

## 2017-01-19 ENCOUNTER — Encounter: Payer: Self-pay | Admitting: Pharmacist

## 2017-01-19 VITALS — BP 138/78 | HR 72 | Ht 64.0 in | Wt 190.0 lb

## 2017-01-19 DIAGNOSIS — Z0000 Encounter for general adult medical examination without abnormal findings: Secondary | ICD-10-CM | POA: Diagnosis not present

## 2017-01-19 NOTE — Progress Notes (Signed)
Patient ID: [name], female   DOB: Jan 21, 1941, 76 y.o.   MRN: [id number]     Subjective:   [name] is a 76 y.o. female who presents for a subsequent Medicare Annual Wellness Visit.  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 Marital history: widowed x last 8 years 3 children She lives alone but one son is close.  She has 1 dog.   Alcohol/Tobacco/Substances: no to all  Patient brings in two radiology reports and has questions about her results regarding back pain and "calcifications that were noted on abdominal CT"  I reviewed results with patient and answered questions.  She is seeing Dr Lorin Mercy regarding back pain and has plans to received steroid epidural in the near future though it has not been scheduled yet.  She is aware that it could increase BG but she has sliding scale for Novolog which should help keep BG from increase a lot.   [name]'s last A1c was excellent at 6.9%.  In addition her HBG readings look great and ranges form 61 to 185.  Has had 5 low BG readings in March - 81, 54, 71, 61, 72.  Have occurred at various times of day - morning, lunch and supper.  Patient knows how to treat hypoglycemia appropriately and I noted that BG following hypoglycemic events is not excessively elevated.   Current Medications (verified) Outpatient Encounter Prescriptions as of 01/19/2017  Medication Sig  . aspirin 81 MG tablet Take 81 mg by mouth daily.    Marland Kitchen atorvastatin (LIPITOR) 20 MG tablet Take 0.5 tablets (10 mg total) by mouth at bedtime.  . Calcium Carb-Cholecalciferol (CALCIUM 600+D3) 600-200 MG-UNIT TABS Take 1 tablet by mouth 2 (two) times daily.    Marland Kitchen desonide (DESOWEN) 0.05 % ointment Apply 1 application topically 2 (two) times daily.   . diclofenac sodium (VOLTAREN) 1 % GEL Apply 4 g topically 4 (four) times daily.  . Dulaglutide (TRULICITY) 1.5 ZM/6.2HU SOPN Inject 1.5 mg into the skin once a week.  . escitalopram  (LEXAPRO) 20 MG tablet Take 1 tablet (20 mg total) by mouth daily.  Marland Kitchen esomeprazole (NEXIUM) 20 MG capsule Take 1 capsule (20 mg total) by mouth daily at 12 noon.  Marland Kitchen estradiol (ESTRACE) 0.1 MG/GM vaginal cream Place 2 g vaginally every Monday, Wednesday, and Friday.   . fluticasone (FLONASE) 50 MCG/ACT nasal spray Place 2 sprays into both nostrils daily.  Marland Kitchen glucose blood (FREESTYLE LITE) test strip Use qid to check blood sugar. Dx E11.9  . insulin aspart (NOVOLOG FLEXPEN) 100 UNIT/ML FlexPen Inject prior to each meal per sliding scale.BG less than 80 - no insulin; 80 to 120 - 3 units; 121 to 150 - 5 units; 151 to 200 - 7 units; 201 to 250 - 8 units; 250 or above - 9 units and call office.  . Insulin Glargine (LANTUS SOLOSTAR) 100 UNIT/ML Solostar Pen Inject 32 Units into the skin every morning.  . Insulin Pen Needle (NOVOFINE) 30G X 8 MM MISC Use to inject insulin up to qid.  Marland Kitchen losartan (COZAAR) 25 MG tablet Take 1 tablet (25 mg total) by mouth daily. (Patient taking differently: Take 12.5 mg by mouth daily. )  . mirabegron ER (MYRBETRIQ) 50 MG TB24 tablet Take 50 mg by mouth daily.  . Multiple Vitamin (MULTIVITAMIN) tablet Take 1 tablet by mouth daily.   . NYSTATIN powder APPLY UNDER BILATERAL BREASTS AND GROIN TWICE A DAY  . nystatin-triamcinolone ointment (  MYCOLOG) APPLY TOPICALLY 2 (TWO) TIMES DAILY.  Marland Kitchen ondansetron (ZOFRAN) 4 MG tablet Take 1 tablet (4 mg total) by mouth every 8 (eight) hours as needed for nausea or vomiting.  Vladimir Faster Glycol-Propyl Glycol 0.4-0.3 % SOLN Apply 1 drop to eye daily as needed (for dry eye relief).   . polyethylene glycol (MIRALAX / GLYCOLAX) packet Take 17 g by mouth once.   . Probiotic Product (PROBIOTIC ADVANCED PO) Take 1 capsule by mouth daily.   . traZODone (DESYREL) 150 MG tablet Take 0.5 tablets (75 mg total) by mouth at bedtime.  . triamcinolone (KENALOG) 0.025 % ointment Apply 1 application topically 2 (two) times daily.  . [DISCONTINUED] metoprolol  succinate (TOPROL-XL) 25 MG 24 hr tablet Take 0.5 tablets (12.5 mg total) by mouth daily. (Patient not taking: Reported on 01/19/2017)   No facility-administered encounter medications on file as of 01/19/2017.     Allergies (verified) Latex; Ambien [zolpidem]; Augmentin [amoxicillin-pot clavulanate]; Belsomra [suvorexant]; Hydrocodone-acetaminophen; Hyoscyamine sulfate; Melatonin; Mobic [meloxicam]; Morphine; Oxycodone; Tramadol; Celebrex [celecoxib]; and Sulfa antibiotics   History: Past Medical History:  Diagnosis Date  . Acute cystitis   . Anxiety   . Depression   . Diabetes mellitus type II   . Diabetic retinopathy   . DM type 2 causing CKD stage 3 ([place])   . Fibromyalgia   . GERD (gastroesophageal reflux disease)   . Hyperlipidemia   . Hypertension   . IBS (irritable bowel syndrome)   . IBS (irritable bowel syndrome)   . Insomnia   . Lichen planus    vulvar  . Pancreatitis, gallstone   . Postmenopausal   . Radial scar of breast 06/04/2016  . Urge incontinence of urine    Past Surgical History:  Procedure Laterality Date  . ANKLE SURGERY  02/2004  . BREAST LUMPECTOMY WITH RADIOACTIVE SEED LOCALIZATION Left 01/22/2016   Procedure: LEFT BREAST LUMPECTOMY WITH RADIOACTIVE SEED LOCALIZATION;  Surgeon: Autumn Messing III, MD;  Location: [facility];  Service: General;  Laterality: Left;  . CATARACT EXTRACTION W/ INTRAOCULAR LENS IMPLANT  09/2003, 12/2003  . CHOLECYSTECTOMY  2004  . EYE SURGERY     laser treatments  . JOINT REPLACEMENT    . TOTAL KNEE ARTHROPLASTY     Left  . TRIGGER FINGER RELEASE Left 01/30/2002  . TUBAL LIGATION  1970   Family History  Problem Relation Age of Onset  . Diabetes Mother   . Hypertension Mother   . Kidney disease Mother   . Heart disease Mother   . Peripheral vascular disease Mother     amputation  . Cancer Father     prostate  . Diabetes Father   . Heart disease Father     CABG  . Hypertension Father   . Heart attack  Father   . Depression Sister   . Hypertension Sister   . Neuropathy Sister    Social History   Occupational History  . retired Retired   Social History Main Topics  . Smoking status: Never Smoker  . Smokeless tobacco: Never Used  . Alcohol use No  . Drug use: No  . Sexual activity: No    Do you feel safe at home?  Yes Are there smokers in your home (other than you)? No  Dietary issues and exercise activities: Current Exercise Habits: Home exercise routine, Type of exercise: Other - see comments (cardio cruiser), Time (Minutes): 15, Frequency (Times/Week): 3, Weekly Exercise (Minutes/Week): 45, Intensity: Moderate  Current Dietary habits:  patietnt  continues to follow CHO counting diet.   Objective:    Today's Vitals   01/19/17 1445  BP: 138/78  Pulse: 72  Weight: 190 lb (86.2 kg)  Height: 5\' 4"  (1.626 m)  PainSc: 4   PainLoc: Back   Body mass index is 32.61 kg/m.  Activities of Daily Living In your present state of health, do you have any difficulty performing the following activities: 01/19/2017  Hearing? N  Vision? N  Difficulty concentrating or making decisions? Y  Walking or climbing stairs? Y  Dressing or bathing? N  Doing errands, shopping? N  Preparing Food and eating ? N  Using the Toilet? N  In the past six months, have you accidently leaked urine? N  Do you have problems with loss of bowel control? N  Managing your Medications? N  Managing your Finances? N  Housekeeping or managing your Housekeeping? N  Some recent data might be hidden     Cardiac Risk Factors include: advanced age (>65men, >66 women);diabetes mellitus;dyslipidemia;family history of premature cardiovascular disease;hypertension;obesity (BMI >30kg/m2)  Depression Screen PHQ 2/9 Scores 01/19/2017 01/04/2017 11/04/2016 10/28/2016  PHQ - 2 Score 2 0 0 0  PHQ- 9 Score 7 - - 0     Fall Risk Fall Risk  01/19/2017 01/04/2017 11/04/2016 10/28/2016 10/20/2016  Falls in the past year? No No No  No No  Risk for fall due to : - - - - -  Risk for fall due to (comments): - - - - -    Cognitive Function: MMSE - Mini Mental State Exam 01/19/2017 01/16/2016 12/30/2014  Orientation to time 5 5 5   Orientation to Place 5 5 5   Registration 3 3 3   Attention/ Calculation 5 5 5   Recall 2 3 3   Language- name 2 objects 2 2 2   Language- repeat 1 1 1   Language- follow 3 step command 3 3 3   Language- read & follow direction 1 1 1   Write a sentence 1 1 1   Copy design 1 1 1   Total score 29 30 30     Immunizations and Health Maintenance Immunization History  Administered Date(s) Administered  . Influenza, High Dose Seasonal PF 07/19/2016  . Influenza,inj,Quad PF,36+ Mos 07/30/2013, 07/24/2014, 08/04/2015  . Pneumococcal Conjugate-13 11/27/2014  . Pneumococcal Polysaccharide-23 10/22/2009  . Td 10/25/2004  . Tdap 11/27/2014  . Zoster 04/06/2012   Health Maintenance Due  Topic Date Due  . MAMMOGRAM  12/09/2016    Patient Care Team: Timmothy Euler, MD as PCP - General (Family Medicine) Kendell Bane, MD (Surgery) Magnus Sinning, MD (Inactive) as Consulting Physician (Orthopedic Surgery) Teena Irani, MD as Consulting Physician (Gastroenterology) Paula Compton, MD as Consulting Physician (Obstetrics and Gynecology) Rolm Bookbinder, MD as Consulting Physician (Dermatology) Bjorn Loser, MD as Consulting Physician (Urology) Minus Breeding, MD as Consulting Physician (Cardiology) Steffanie Rainwater, DPM as Consulting Physician (Podiatry) Raeanne Gathers, AUD as Consulting Physician (Audiology) Susa Day, MD as Consulting Physician (Orthopedic Surgery)  Indicate any recent Medical Services you may have received from other than [facility] providers in the past year (date may be approximate).    Assessment:    Annual Wellness Visit  Diabetes - controlled but concerned about hypoglycemia   Screening Tests Health Maintenance  Topic Date Due  . MAMMOGRAM  12/09/2016  . DEXA SCAN  03/19/2017  .  HEMOGLOBIN A1C  07/07/2017  . OPHTHALMOLOGY EXAM  09/10/2017  . FOOT EXAM  01/04/2018  . TETANUS/TDAP  11/27/2024  . INFLUENZA VACCINE  Completed  .  PNA vac Low Risk Adult  Completed        Plan:   During the course of the visit Anjel was educated and counseled about the following appropriate screening and preventive services:   Vaccines to include Pneumoccal, Influenza, Td and [place]les - all are  UTD  Colorectal cancer screening - UTD  Cardiovascular disease screening - UTD and has appt with cardiologist soon   BP is controlled and lipids are at goal and she is taking statin regularly  Reviewed s/s of hypoglycemia  Continue Lantus 32 units each morning.   Change Novolog - Inject Novolog prior to each meal per sliding scale:  BG less than 80 - no insulin;   80 to 120 - 2 units;   121 to 150 - 4 units;   151 to 200 - 6 units;   201 to 250 - 7 units;   250 or above - 8 units and call office.  Bone Denisty / Osteoporosis Screening - due now -patient usually gets through GYN at [facility] - reminded to make appt  Mammogram - due now - usually gets at GYN.  Reminded to make appt  Glaucoma screening / Diabetic Eye Exam - UTD  Nutrition counseling - reviewed CHO in foods and discussed limiting caloric intake and serving sizes  Discussed weight goal of 180 lbs.   Advanced Directives - pt to look for copy to bring in for our records  Physical Activity - increase to 150 minute daily as able   Goals    . Weight (lb) < 180 lb (81.6 kg)        Patient Instructions (the written plan) were given to the patient.   Cherre Robins, PharmD   01/19/2017

## 2017-01-19 NOTE — Patient Instructions (Addendum)
  [name] , Thank you for taking time to come for your Medicare Wellness Visit. I appreciate your ongoing commitment to your health goals. Please review the following plan we discussed and let me know if I can assist you in the future.   These are the goals we discussed:  Call Dr Nena Alexander office for gynecology appointment and mammogram - also ask about Bone Density. If they don't do bone density in their office we can do here.  Continue Lantus 32 units each morning.   Inject Novolog prior to each meal per sliding scale:  BG less than 80 - no insulin;   80 to 120 - 2 units;   121 to 150 - 4 units;   151 to 200 - 6 units;   201 to 250 - 7 units;   250 or above - 8 units and call office.  Look for copy of [place] (important to know where these are kept - you can also bring copy to our office to be placed in our file / electronic chart)  Continue to exercise - try to increase to goal os at least 150 minutes per week.   Great job with blood glucose - continue to limit high carbohydrate and sugar containing foods such as sweets, cakes, cookies, ice cream and pie;  Potatoes, peas, beans and bread.   This is a list of the screening recommended for you and due dates:  Health Maintenance  Topic Date Due  . Mammogram  12/09/2016  . DEXA scan (bone density measurement)  03/19/2017  . Hemoglobin A1C  07/07/2017  . Eye exam for diabetics  09/10/2017  . Complete foot exam   01/04/2018  . Tetanus Vaccine  11/27/2024  . Flu Shot  Completed  . Pneumonia vaccines  Completed

## 2017-02-01 DIAGNOSIS — L84 Corns and callosities: Secondary | ICD-10-CM | POA: Diagnosis not present

## 2017-02-01 DIAGNOSIS — E1142 Type 2 diabetes mellitus with diabetic polyneuropathy: Secondary | ICD-10-CM | POA: Diagnosis not present

## 2017-02-01 DIAGNOSIS — B351 Tinea unguium: Secondary | ICD-10-CM | POA: Diagnosis not present

## 2017-02-01 DIAGNOSIS — M79675 Pain in left toe(s): Secondary | ICD-10-CM | POA: Diagnosis not present

## 2017-02-02 ENCOUNTER — Telehealth: Payer: Self-pay | Admitting: Family Medicine

## 2017-02-02 DIAGNOSIS — Z794 Long term (current) use of insulin: Principal | ICD-10-CM

## 2017-02-02 DIAGNOSIS — E119 Type 2 diabetes mellitus without complications: Secondary | ICD-10-CM

## 2017-02-02 MED ORDER — GLUCOSE BLOOD VI STRP: ORAL_STRIP | 11 refills | Status: DC

## 2017-02-02 NOTE — Telephone Encounter (Signed)
Refill printed, patient aware.

## 2017-02-02 NOTE — Telephone Encounter (Signed)
What is the name of the medication? Free style light glucose test strips. 90 days with three refills  Have you contacted your pharmacy to request a refill? no  Which pharmacy would you like this sent to? Needs written to send to [facility]. Call when ready to pick up.   Patient notified that their request is being sent to the clinical staff for review and that they should receive a call once it is complete. If they do not receive a call within 24 hours they can check with their pharmacy or our office.

## 2017-02-03 ENCOUNTER — Ambulatory Visit (INDEPENDENT_AMBULATORY_CARE_PROVIDER_SITE_OTHER): Payer: Medicare Other | Admitting: Orthopaedic Surgery

## 2017-02-03 ENCOUNTER — Other Ambulatory Visit: Payer: Self-pay

## 2017-02-03 ENCOUNTER — Encounter (INDEPENDENT_AMBULATORY_CARE_PROVIDER_SITE_OTHER): Payer: Self-pay | Admitting: Orthopaedic Surgery

## 2017-02-03 VITALS — BP 127/65 | HR 83 | Ht 64.0 in | Wt 185.0 lb

## 2017-02-03 DIAGNOSIS — M5136 Other intervertebral disc degeneration, lumbar region: Secondary | ICD-10-CM | POA: Diagnosis not present

## 2017-02-03 DIAGNOSIS — E119 Type 2 diabetes mellitus without complications: Secondary | ICD-10-CM

## 2017-02-03 DIAGNOSIS — Z794 Long term (current) use of insulin: Principal | ICD-10-CM

## 2017-02-03 MED ORDER — GLUCOSE BLOOD VI STRP: ORAL_STRIP | 11 refills | Status: DC

## 2017-02-03 NOTE — Progress Notes (Signed)
Office Visit Note   Patient: [name]           Date of Birth: 01-16-1941           MRN: [id number] Visit Date: 02/03/2017              Requested by: Timmothy Euler, MD [place], [place] [address] PCP: Kenn File, MD   Assessment & Plan: Visit Diagnoses:  1. Other intervertebral disc degeneration, lumbar region     Plan: She'll work on some dieting try to gradually increase her walking to lose a little bit of weight. I'll check her back again in 3 months. Follow-Up Instructions: Return in about 3 months (around 05/05/2017).   Orders:  No orders of the defined types were placed in this encounter.  No orders of the defined types were placed in this encounter.     Procedures: No procedures performed   Clinical Data: No additional findings.   Subjective: Chief Complaint  Patient presents with  . Lower Back - Pain, Follow-up  . Right Hip - Pain, Follow-up    HPI patient got about 25% or so relief from the right 1 injection. She still has back pain that is giving her problems ongoing. She denies fever chills. No bowel or bladder problems. She has about a second injection but since she only got the small level of improvement I would defer the injection.  Review of Systems review of systems updated from last visit. Still positive for osteoporosis mitral valve prolapse history fibromyalgia, IBS. Type 2 diabetes .MRI scan showed disc degeneration at the L3-4 level with endplate changes and some disc bulge..   Objective: Vital Signs: BP 127/65   Pulse 83   Ht 5\' 4"  (1.626 m)   Wt 185 lb (83.9 kg)   BMI 31.76 kg/m   Physical Exam  Constitutional: She is oriented to person, place, and time. She appears well-developed.  HENT:  Head: Normocephalic.  Right Ear: External ear normal.  Left Ear: External ear normal.  Eyes: Pupils are equal, round, and reactive to light.  Neck: No tracheal deviation present. No thyromegaly present.  Cardiovascular:  Normal rate.   Pulmonary/Chest: Effort normal.  Abdominal: Soft.  Musculoskeletal:  Negative straight leg raising. She walks gingerly but is not using any walking aids. She is in walking and and only has a 90 flexion after left total knee arthroplasty but does have full extension. Anterior tib EHL quads are strong. Trochanteric bursa still slightly tender on the right none on the left. Mild sciatic notch tenderness. Skin or lumbar regions normal mouth, tenderness over the spinous processes with palpation L3-S1. Negative Faber test.  Neurological: She is alert and oriented to person, place, and time.  Skin: Skin is warm and dry.  Psychiatric: She has a normal mood and affect. Her behavior is normal.    Ortho Exam  Specialty Comments:  No specialty comments available.  Imaging: No results found.   PMFS History: Patient Active Problem List   Diagnosis Date Noted  . CKD (chronic kidney disease) stage 3, GFR 30-59 ml/min 01/04/2017  . Diabetic neuropathy ([place]) 06/18/2016  . OAB (overactive bladder) 06/18/2016  . Radial scar of breast 06/04/2016  . Healthcare maintenance 01/19/2016  . Diabetic retinopathy ([place]) 09/16/2015  . Obesity (BMI 30-39.9) 07/04/2014  . Aneurysm of iliac artery ([place]) 01/07/2014  . GERD (gastroesophageal reflux disease) 03/23/2013  . Type 2 diabetes mellitus with insulin therapy ([place]) 03/23/2013  . Insomnia 03/23/2013  .  Other malaise and fatigue 03/23/2013  . MITRAL VALVE PROLAPSE 07/17/2009  . Lichen planus 58/06/9832  . Fibromyalgia 07/17/2009  . VAGINITIS 04/04/2009  . Urinary incontinence 03/14/2009  . CHRONIC ULCER OF OTHER SPECIFIED SITE 12/24/2008  . Irritable bowel syndrome 08/29/2008  . BACK PAIN, LUMBAR 07/17/2008  . ACUTE CYSTITIS 01/24/2008  . UNSPECIFIED OSTEOPOROSIS 01/10/2008  . Dyslipidemia associated with type 2 diabetes mellitus ([place]) 05/22/2007  . Generalized anxiety disorder 05/22/2007  . Essential hypertension 05/22/2007   Past  Medical History:  Diagnosis Date  . Acute cystitis   . Anxiety   . Depression   . Diabetes mellitus type II   . Diabetic retinopathy   . DM type 2 causing CKD stage 3 ([place])   . Fibromyalgia   . GERD (gastroesophageal reflux disease)   . Hyperlipidemia   . Hypertension   . IBS (irritable bowel syndrome)   . IBS (irritable bowel syndrome)   . Insomnia   . Lichen planus    vulvar  . Pancreatitis, gallstone   . Postmenopausal   . Radial scar of breast 06/04/2016  . Urge incontinence of urine     Family History  Problem Relation Age of Onset  . Diabetes Mother   . Hypertension Mother   . Kidney disease Mother   . Heart disease Mother   . Peripheral vascular disease Mother     amputation  . Cancer Father     prostate  . Diabetes Father   . Heart disease Father     CABG  . Hypertension Father   . Heart attack Father   . Depression Sister   . Hypertension Sister   . Neuropathy Sister     Past Surgical History:  Procedure Laterality Date  . ANKLE SURGERY  02/2004  . BREAST LUMPECTOMY WITH RADIOACTIVE SEED LOCALIZATION Left 01/22/2016   Procedure: LEFT BREAST LUMPECTOMY WITH RADIOACTIVE SEED LOCALIZATION;  Surgeon: Autumn Messing III, MD;  Location: [facility];  Service: General;  Laterality: Left;  . CATARACT EXTRACTION W/ INTRAOCULAR LENS IMPLANT  09/2003, 12/2003  . CHOLECYSTECTOMY  2004  . EYE SURGERY     laser treatments  . JOINT REPLACEMENT    . TOTAL KNEE ARTHROPLASTY     Left  . TRIGGER FINGER RELEASE Left 01/30/2002  . TUBAL LIGATION  1970   Social History   Occupational History  . retired Retired   Social History Main Topics  . Smoking status: Never Smoker  . Smokeless tobacco: Never Used  . Alcohol use No  . Drug use: No  . Sexual activity: No

## 2017-02-28 ENCOUNTER — Telehealth: Payer: Self-pay | Admitting: Family Medicine

## 2017-02-28 ENCOUNTER — Other Ambulatory Visit: Payer: Self-pay | Admitting: Family Medicine

## 2017-02-28 MED ORDER — DICLOFENAC SODIUM 1 % TD GEL: 4.0000 g | Freq: Four times a day (QID) | TRANSDERMAL | 3 refills | Status: DC

## 2017-02-28 MED ORDER — LOSARTAN POTASSIUM 25 MG PO TABS: 12.5000 mg | ORAL_TABLET | Freq: Every day | ORAL | 1 refills | Status: DC

## 2017-02-28 NOTE — Telephone Encounter (Signed)
Pt aware rxs are printed and ready

## 2017-02-28 NOTE — Telephone Encounter (Signed)
Patient had chronic health visit 01-04-17 with lab work.  Please advise if refills approved.

## 2017-04-08 ENCOUNTER — Encounter: Payer: Self-pay | Admitting: Family Medicine

## 2017-04-08 ENCOUNTER — Ambulatory Visit (INDEPENDENT_AMBULATORY_CARE_PROVIDER_SITE_OTHER): Payer: Medicare Other | Admitting: Family Medicine

## 2017-04-08 VITALS — BP 137/70 | HR 75 | Temp 98.2°F | Ht 64.0 in | Wt 187.2 lb

## 2017-04-08 DIAGNOSIS — E785 Hyperlipidemia, unspecified: Secondary | ICD-10-CM | POA: Diagnosis not present

## 2017-04-08 DIAGNOSIS — I10 Essential (primary) hypertension: Secondary | ICD-10-CM | POA: Diagnosis not present

## 2017-04-08 DIAGNOSIS — Z794 Long term (current) use of insulin: Secondary | ICD-10-CM

## 2017-04-08 DIAGNOSIS — G4700 Insomnia, unspecified: Secondary | ICD-10-CM

## 2017-04-08 DIAGNOSIS — E119 Type 2 diabetes mellitus without complications: Secondary | ICD-10-CM | POA: Diagnosis not present

## 2017-04-08 DIAGNOSIS — E1169 Type 2 diabetes mellitus with other specified complication: Secondary | ICD-10-CM

## 2017-04-08 LAB — BAYER DCA HB A1C WAIVED: HB A1C (BAYER DCA - WAIVED): 6.5 % (ref <7.0)

## 2017-04-08 MED ORDER — INSULIN PEN NEEDLE 32G X 6 MM MISC: 1.0000 | Freq: Four times a day (QID) | 11 refills | Status: DC

## 2017-04-08 MED ORDER — TRAZODONE HCL 150 MG PO TABS: 75.0000 mg | ORAL_TABLET | Freq: Every day | ORAL | 3 refills | Status: DC

## 2017-04-08 NOTE — Progress Notes (Signed)
   HPI  Patient presents today here to follow-up for chronic medical conditions.  Patient feels well and has no complaints.  She needs a refill of trazodone, she's taking Septra 5 mg and is working well.  Diabetes Watching her diet moderately, she takes 32 units of basal insulin daily +2-6 units of mealtime insulin up to 3 times daily. Also taking GLP Average fasting 140 170, pre-meal 120-180, has had 2 low blood sugars as low as 57, the other 62. ( she has good hypoglycemia awareness) She's walking 2 times a week.  Hyperlipidemia Good medication compliance, no side effect.    PMH: Smoking status noted ROS: Per HPI  Objective: BP 137/70   Pulse 75   Temp 98.2 F (36.8 C) (Oral)   Ht '5\' 4"'$  (1.626 m)   Wt 187 lb 3.2 oz (84.9 kg)   BMI 32.13 kg/m  Gen: NAD, alert, cooperative with exam HEENT: NCAT, EOMI, PERRL CV: RRR, good S1/S2, no murmur Resp: CTABL, no wheezes, non-labored Abd: SNTND, BS present, no guarding or organomegaly Ext: No edema, warm Neuro: Alert and oriented, No gross deficits  Diabetic Foot Exam - Simple   Simple Foot Form Diabetic Foot exam was performed with the following findings:  Yes 04/08/2017  3:39 PM  Visual Inspection No deformities, no ulcerations, no other skin breakdown bilaterally:  Yes Sensation Testing See comments:  Yes Pulse Check Posterior Tibialis and Dorsalis pulse intact bilaterally:  Yes Comments Sensation absent with monofilament on right heel, otherwise intact     Assessment and plan:  # Type 2 diabetes Insulin dependent, control improved much, A1c now 6.5 No changes Discussed hypoglycemia, if she continues to have these episodes would back off the basal insulin. Foot exam as above Requesting thinner needles. Prescription given  # Insomnia Doing well with trazodone 75 mg, refill  # Hyperlipidemia Repeat labs, Lipitor 20 mg.   Orders Placed This Encounter  Procedures  . Bayer DCA Hb A1c Waived  . LDL  Cholesterol, Direct  . CMP14+EGFR  . CBC with Differential/Platelet    Meds ordered this encounter  Medications  . Insulin Pen Needle 32G X 6 MM MISC    Sig: 1 each by Does not apply route 4 (four) times daily.    Dispense:  200 each    Refill:  11  . traZODone (DESYREL) 150 MG tablet    Sig: Take 0.5 tablets (75 mg total) by mouth at bedtime.    Dispense:  45 tablet    Refill:  Carroll, MD [place] 04/08/2017, 3:40 PM

## 2017-04-08 NOTE — Patient Instructions (Signed)
Great to see you!  Come back in 3 months unless you need [place] sooner.   You are doing great, keep up the good work!

## 2017-04-09 LAB — CBC WITH DIFFERENTIAL/PLATELET
BASOS: 1 %
Basophils Absolute: 0 10*3/uL (ref 0.0–0.2)
EOS (ABSOLUTE): 0.1 10*3/uL (ref 0.0–0.4)
Eos: 2 %
HEMOGLOBIN: 13.1 g/dL (ref 11.1–15.9)
Hematocrit: 39.7 % (ref 34.0–46.6)
Immature Grans (Abs): 0 10*3/uL (ref 0.0–0.1)
Immature Granulocytes: 0 %
LYMPHS: 33 %
Lymphocytes Absolute: 1.6 10*3/uL (ref 0.7–3.1)
MCH: 31.1 pg (ref 26.6–33.0)
MCHC: 33 g/dL (ref 31.5–35.7)
MCV: 94 fL (ref 79–97)
MONOCYTES: 7 %
Monocytes Absolute: 0.4 10*3/uL (ref 0.1–0.9)
NEUTROS ABS: 2.9 10*3/uL (ref 1.4–7.0)
Neutrophils: 57 %
Platelets: 167 10*3/uL (ref 150–379)
RBC: 4.21 x10E6/uL (ref 3.77–5.28)
RDW: 13.8 % (ref 12.3–15.4)
WBC: 5 10*3/uL (ref 3.4–10.8)

## 2017-04-09 LAB — CMP14+EGFR
A/G RATIO: 1.7 (ref 1.2–2.2)
ALBUMIN: 3.8 g/dL (ref 3.5–4.8)
ALT: 17 IU/L (ref 0–32)
AST: 24 IU/L (ref 0–40)
Alkaline Phosphatase: 59 IU/L (ref 39–117)
BUN/Creatinine Ratio: 19 (ref 12–28)
BUN: 21 mg/dL (ref 8–27)
Bilirubin Total: 0.3 mg/dL (ref 0.0–1.2)
CALCIUM: 9 mg/dL (ref 8.7–10.3)
CO2: 28 mmol/L (ref 20–29)
Chloride: 95 mmol/L — ABNORMAL LOW (ref 96–106)
Creatinine, Ser: 1.12 mg/dL — ABNORMAL HIGH (ref 0.57–1.00)
GFR, EST AFRICAN AMERICAN: 55 mL/min/{1.73_m2} — AB (ref >59)
GFR, EST NON AFRICAN AMERICAN: 48 mL/min/{1.73_m2} — AB (ref >59)
GLOBULIN, TOTAL: 2.3 g/dL (ref 1.5–4.5)
Glucose: 215 mg/dL — ABNORMAL HIGH (ref 65–99)
POTASSIUM: 4.7 mmol/L (ref 3.5–5.2)
Sodium: 136 mmol/L (ref 134–144)
TOTAL PROTEIN: 6.1 g/dL (ref 6.0–8.5)

## 2017-04-09 LAB — LDL CHOLESTEROL, DIRECT: LDL Direct: 46 mg/dL (ref 0–99)

## 2017-04-11 ENCOUNTER — Ambulatory Visit (INDEPENDENT_AMBULATORY_CARE_PROVIDER_SITE_OTHER): Payer: Medicare Other | Admitting: Ophthalmology

## 2017-04-11 DIAGNOSIS — E113391 Type 2 diabetes mellitus with moderate nonproliferative diabetic retinopathy without macular edema, right eye: Secondary | ICD-10-CM | POA: Diagnosis not present

## 2017-04-11 DIAGNOSIS — I10 Essential (primary) hypertension: Secondary | ICD-10-CM | POA: Diagnosis not present

## 2017-04-11 DIAGNOSIS — E11319 Type 2 diabetes mellitus with unspecified diabetic retinopathy without macular edema: Secondary | ICD-10-CM | POA: Diagnosis not present

## 2017-04-11 DIAGNOSIS — H43813 Vitreous degeneration, bilateral: Secondary | ICD-10-CM | POA: Diagnosis not present

## 2017-04-11 DIAGNOSIS — E113292 Type 2 diabetes mellitus with mild nonproliferative diabetic retinopathy without macular edema, left eye: Secondary | ICD-10-CM

## 2017-04-11 DIAGNOSIS — H35033 Hypertensive retinopathy, bilateral: Secondary | ICD-10-CM | POA: Diagnosis not present

## 2017-04-14 ENCOUNTER — Other Ambulatory Visit: Payer: Self-pay

## 2017-04-14 ENCOUNTER — Telehealth: Payer: Self-pay | Admitting: Family Medicine

## 2017-04-14 DIAGNOSIS — E119 Type 2 diabetes mellitus without complications: Secondary | ICD-10-CM | POA: Diagnosis not present

## 2017-04-14 DIAGNOSIS — H40033 Anatomical narrow angle, bilateral: Secondary | ICD-10-CM | POA: Diagnosis not present

## 2017-04-14 MED ORDER — DULAGLUTIDE 1.5 MG/0.5ML ~~LOC~~ SOAJ: 1.5000 mg | SUBCUTANEOUS | 3 refills | Status: DC

## 2017-04-14 NOTE — Telephone Encounter (Signed)
What is the name of the medication? Dulaglutide (TRULICITY) 1.5 WT/0.9MB SOPN pt wants 3 mo supply with 3 refills  Have you contacted your pharmacy to request a refill? No its mail order to [facility] would you like this sent to? Mail order.   Patient notified that their request is being sent to the clinical staff for review and that they should receive a call once it is complete. If they do not receive a call within 24 hours they can check with their pharmacy or our office.

## 2017-04-14 NOTE — Telephone Encounter (Signed)
Rx was printed and patient notified

## 2017-04-14 NOTE — Telephone Encounter (Signed)
Rx printed for patient and notified

## 2017-04-19 ENCOUNTER — Other Ambulatory Visit: Payer: Self-pay | Admitting: Family Medicine

## 2017-04-19 DIAGNOSIS — N760 Acute vaginitis: Secondary | ICD-10-CM

## 2017-04-19 DIAGNOSIS — B372 Candidiasis of skin and nail: Secondary | ICD-10-CM

## 2017-04-23 DIAGNOSIS — H04123 Dry eye syndrome of bilateral lacrimal glands: Secondary | ICD-10-CM | POA: Diagnosis not present

## 2017-05-05 ENCOUNTER — Ambulatory Visit (INDEPENDENT_AMBULATORY_CARE_PROVIDER_SITE_OTHER): Payer: Medicare Other | Admitting: Orthopaedic Surgery

## 2017-05-17 DIAGNOSIS — L84 Corns and callosities: Secondary | ICD-10-CM | POA: Diagnosis not present

## 2017-05-17 DIAGNOSIS — B351 Tinea unguium: Secondary | ICD-10-CM | POA: Diagnosis not present

## 2017-05-17 DIAGNOSIS — E1142 Type 2 diabetes mellitus with diabetic polyneuropathy: Secondary | ICD-10-CM | POA: Diagnosis not present

## 2017-05-17 DIAGNOSIS — M79675 Pain in left toe(s): Secondary | ICD-10-CM | POA: Diagnosis not present

## 2017-05-31 DIAGNOSIS — Z1389 Encounter for screening for other disorder: Secondary | ICD-10-CM | POA: Diagnosis not present

## 2017-05-31 DIAGNOSIS — Z124 Encounter for screening for malignant neoplasm of cervix: Secondary | ICD-10-CM | POA: Diagnosis not present

## 2017-05-31 DIAGNOSIS — Z1231 Encounter for screening mammogram for malignant neoplasm of breast: Secondary | ICD-10-CM | POA: Diagnosis not present

## 2017-05-31 DIAGNOSIS — Z01419 Encounter for gynecological examination (general) (routine) without abnormal findings: Secondary | ICD-10-CM | POA: Diagnosis not present

## 2017-06-02 ENCOUNTER — Telehealth: Payer: Self-pay | Admitting: *Deleted

## 2017-06-02 NOTE — Telephone Encounter (Signed)
Per pt, she has changed her diet, trying to eat more healthy Since doing this she is having some hypoglycemic episodes Today BS was 48 Recently she has had several in the low 50's She has not had any recent changes in medications Pt is leaving to go out of town on Sunday Please advise

## 2017-06-02 NOTE — Telephone Encounter (Signed)
Patient called by Jake Michaelis.  Advised on hypoglycemia treatment and to recheck BG in 15 minutes.  If not over 70 then call office. She was given appt for tomorrow with Cherre Robins to make medication changes.

## 2017-06-03 ENCOUNTER — Ambulatory Visit (INDEPENDENT_AMBULATORY_CARE_PROVIDER_SITE_OTHER): Payer: Medicare Other | Admitting: Pharmacist

## 2017-06-03 ENCOUNTER — Encounter: Payer: Self-pay | Admitting: Pharmacist

## 2017-06-03 VITALS — BP 132/70 | Ht 64.0 in | Wt 182.8 lb

## 2017-06-03 DIAGNOSIS — N183 Chronic kidney disease, stage 3 (moderate): Secondary | ICD-10-CM | POA: Diagnosis not present

## 2017-06-03 DIAGNOSIS — Z780 Asymptomatic menopausal state: Secondary | ICD-10-CM | POA: Diagnosis not present

## 2017-06-03 DIAGNOSIS — Z794 Long term (current) use of insulin: Secondary | ICD-10-CM | POA: Diagnosis not present

## 2017-06-03 DIAGNOSIS — E162 Hypoglycemia, unspecified: Secondary | ICD-10-CM | POA: Diagnosis not present

## 2017-06-03 DIAGNOSIS — E119 Type 2 diabetes mellitus without complications: Secondary | ICD-10-CM | POA: Diagnosis not present

## 2017-06-03 DIAGNOSIS — E1122 Type 2 diabetes mellitus with diabetic chronic kidney disease: Secondary | ICD-10-CM | POA: Diagnosis not present

## 2017-06-03 MED ORDER — INSULIN ASPART 100 UNIT/ML FLEXPEN: PEN_INJECTOR | SUBCUTANEOUS | 3 refills | Status: DC

## 2017-06-03 NOTE — Progress Notes (Signed)
Patient ID: [name], female   DOB: 02-15-1941, 76 y.o.   MRN: [id number]   Subjective:    [name] is a 76 y.o. female who presents for an follow up evaluation of Type 2 diabetes mellitus.  Current symptoms/problems include hypoglycemia  and have been increasing in frequency for the last month. Reviewed BG records and patient has had 4 hypoglycemic events over the last 1 month.  All 4 events have occurred about 4 to 5pm between lunch and supper.  Patient has been trying to eat healthier and exercising over the last month.   Current diabetic medications include insulin injections: Lantus : 32 units qd and Novolog sliding scale. Inject Novolog prior to each meal per sliding scale.BG less than 80 - no insulin; 80 to 120 - 3 units; 121 to 150 - 5 units; 151 to 200 - 7 units; 201 to 250 - 8 units; 250 or above - 9 units and call office  Current monitoring regimen: home blood tests - 4 times daily Home blood sugar records:  AM - 125, 150, 96, 135, 142, 125, 126, 109, 108, 118 Prior to Lunch - 150, 145, 146, 81, 168, 198, 165, 110, 121 Between lunch and supper - 67, 55, 58, 48 Before evening meal - 121, 123, 103, 136, 128, 90, 176, 130   Known diabetic complications: nephropathy, retinopathy and peripheral neuropathy Cardiovascular risk factors: advanced age (older than 6 for men, 46 for women), diabetes mellitus and dyslipidemia Eye exam current (within one year): yes Weight trend: decreasing steadily - decrease about 5# over the last 8 weeks Prior visit with CDE: yes -   Current diet: in general, a "healthy" diet   Reports smaller portion sizes, more fruits and vegetables over the last month.  Current exercise: twice weekly about 30 minutes on cardio cruiser Medication Compliance?  Yes   Is She on ACE inhibitor or angiotensin II receptor blocker?  Yes    losartan (Cozaar)   Objective:    BP 132/70   Ht 5\' 4"  (1.626 m)   Wt 182 lb 12 oz (82.9 kg)   BMI 31.37 kg/m    A1c =  6.5% (04/08/2017) A1c = 6.9% (01/04/2017)  Lab Review BMP Latest Ref Rng & Units 04/08/2017 09/24/2016 03/17/2016  Glucose 65 - 99 mg/dL 215(H) 145(H) 217(H)  BUN 8 - 27 mg/dL 21 27(H) 18  Creatinine 0.57 - 1.00 mg/dL 1.12(H) 1.30(H) 1.28(H)  BUN/Creat Ratio 12 - 28 19 - 14  Sodium 134 - 144 mmol/L 136 132(L) 139  Potassium 3.5 - 5.2 mmol/L 4.7 4.1 4.8  Chloride 96 - 106 mmol/L 95(L) 97(L) 96  CO2 20 - 29 mmol/L 28 25 25   Calcium 8.7 - 10.3 mg/dL 9.0 8.9 9.3      Assessment:   Type 2 diabetes requiring insulin therapy - recent TLC changes leading to increased hypoglycemia  Health Maintainence - due Dexa (mammogram done August 2018 at [facility] - note sent to our quality department to request records.)  Plan:    1.  Rx changes:   Continue Lantus 32 units qd  Novolog - inject prior to each meal per sliding scale below.   BG less than 80 - no insulin; 80 to 120 - 3 units; 121 to 150 - 4 units; 151 to 200 - 5 units; 201 to 250 - 6 units; 250 or above - 7 units and call office 2.  Reviewed s/s of hypoglycemia and treatment.  3. Discussed CGM -  pamphlet given to patinet 4. Continue CHO counting diet discussed. 5.  Recommend continue to check BG 4 times a day 6.  Patient commended on  twice weekly exercise. Increase as able to goal of 150 minutes per week  7. Follow up: 6 weeks  with PCP as planned   Orders Placed This Encounter  Procedures  . DG [facility] DEXA    Standing Status:   Future    Standing Expiration Date:   09/03/2017    Order Specific Question:   Reason for Exam (SYMPTOM  OR DIAGNOSIS REQUIRED)    Answer:   post menopausal female  . Ambulatory referral to Endocrinology    Referral Priority:   Routine    Referral Type:   Consultation    Referral Reason:   Specialty Services Required    Requested Specialty:   Endocrinology    Number of Visits Requested:   1

## 2017-06-03 NOTE — Patient Instructions (Signed)
Novolog - inject prior to each meal per sliding scale below.  BG less than 80 - no insulin;  80 to 120 - 3 units;  121 to 150 - 4 units;  151 to 200 - 5 units;  201 to 250 - 6 units;  250 or above - 7 units and call office  We are sending in referral for endocrinologist - Please call office referral department [phone]) if you have not been contacted by June 13, 2017

## 2017-06-08 ENCOUNTER — Ambulatory Visit: Payer: Medicare Other | Admitting: Pharmacist

## 2017-06-14 ENCOUNTER — Telehealth: Payer: Self-pay | Admitting: Family Medicine

## 2017-06-14 DIAGNOSIS — N760 Acute vaginitis: Secondary | ICD-10-CM

## 2017-06-14 DIAGNOSIS — B372 Candidiasis of skin and nail: Secondary | ICD-10-CM

## 2017-06-14 DIAGNOSIS — K219 Gastro-esophageal reflux disease without esophagitis: Secondary | ICD-10-CM

## 2017-06-14 MED ORDER — ESOMEPRAZOLE MAGNESIUM 20 MG PO CPDR: 20.0000 mg | DELAYED_RELEASE_CAPSULE | Freq: Every day | ORAL | 3 refills | Status: DC

## 2017-06-14 MED ORDER — NYSTATIN 100000 UNIT/GM EX POWD: CUTANEOUS | 3 refills | Status: DC

## 2017-06-14 MED ORDER — NYSTATIN-TRIAMCINOLONE 100000-0.1 UNIT/GM-% EX OINT: TOPICAL_OINTMENT | CUTANEOUS | 2 refills | Status: DC

## 2017-06-14 NOTE — Telephone Encounter (Signed)
R x printed and given to nursing.   Laroy Apple, MD [facility] 06/14/2017, 5:48 PM

## 2017-06-14 NOTE — Telephone Encounter (Signed)
Patient aware that we have mailed rx to her.

## 2017-07-12 ENCOUNTER — Ambulatory Visit (HOSPITAL_COMMUNITY): Payer: Medicare Other | Admitting: Oncology

## 2017-07-12 ENCOUNTER — Encounter (HOSPITAL_COMMUNITY): Payer: Self-pay

## 2017-07-12 ENCOUNTER — Ambulatory Visit: Payer: Medicare Other | Admitting: Family Medicine

## 2017-07-12 ENCOUNTER — Encounter (HOSPITAL_COMMUNITY): Payer: Medicare Other | Attending: Oncology | Admitting: Oncology

## 2017-07-12 VITALS — BP 153/55 | HR 81 | Resp 16 | Ht 63.5 in | Wt 186.0 lb

## 2017-07-12 DIAGNOSIS — N6489 Other specified disorders of breast: Secondary | ICD-10-CM | POA: Diagnosis not present

## 2017-07-12 DIAGNOSIS — M545 Low back pain: Secondary | ICD-10-CM | POA: Diagnosis not present

## 2017-07-12 DIAGNOSIS — G8929 Other chronic pain: Secondary | ICD-10-CM | POA: Diagnosis not present

## 2017-07-12 NOTE — Progress Notes (Signed)
[facility] Hematology/Oncology Consultation   Name: [name]      MRN: [id number]    Date: 07/12/2017 Time:1:49 PM   REFERRING PHYSICIAN:  Autumn Messing, MD (Gen Surg)  REASON FOR CONSULT:  High risk breast   DIAGNOSIS:  Complex sclerosing lesion/radial scar of left breast  HISTORY OF PRESENT ILLNESS:   [name] is a 76 y.o. female with a medical history significant for dyslipidemia, general anxiety disorder, hypertension, mitral valve prolapse, uro-bowel syndrome, fibromyalgia, GERD, diabetes type 2 with insulin therapy, insomnia, obesity, diabetic retinopathy, diabetic neuropathy who is referred to the [facility] for complex sclerosing lesion/radial scar of left breast.  The patient reports that she underwent annual mammogram in January/February 2017. She notes that she is typically on time for her annual screening mammogram ordered by her gynecologist, Dr. Paula Compton, but notes that this one was approximately 6 months late. She reports that this mammogram was abnormal and therefore she underwent diagnostic mammogram with ultrasound of left breast. Mammogram on 12/03/2015 demonstrates a persistent area of distortion in the left upper outer quadrant of breast measuring 1.4 x 1.1 x 0.5 cm and a hypoechoic mass in the left breast at the 2:00 position 4 cm from the nipple measuring 565 mm.  This was followed by a biopsy on 12/10/2015 demonstrating a complex sclerosing lesion/radial scar showing fibrocystic changes and usual ductal hyperplasia. Additionally, there was associated calcifications. Subsequently, on 01/22/2016, she underwent a left breast lumpectomy with seed placement. Pathology demonstrated a complex sclerosing lesion/radial scar showing fibrocystic changes and usual ductal hyperplasia without any indication of malignancy   INTERVAL HISTORY: Today, she denies any complaints. She recently had her mammogram in July 2018 and it was negative  for malignancy. Patient states that she gets an occasional twinge at the site of her lumpectomy her left breast but otherwise has not palpated any new breast masses. She denies any chest pain, shores breath, abdominal pain. She does have chronic back pain which is unchanged.  Review of Systems  Constitutional: Positive for malaise/fatigue. Negative for chills, fever and weight loss.  HENT: Negative.  Negative for congestion and sore throat.   Eyes: Negative.  Negative for double vision.  Respiratory: Negative.  Negative for cough, hemoptysis, sputum production, shortness of breath and wheezing.   Cardiovascular: Negative.  Negative for chest pain.  Gastrointestinal: Negative.  Negative for blood in stool, constipation, diarrhea, melena, nausea and vomiting.  Genitourinary: Negative.  Negative for dysuria.  Musculoskeletal: Positive for back pain. Negative for falls.  Skin: Negative.   Neurological: Negative.  Negative for weakness and headaches.  Endo/Heme/Allergies: Negative.   Psychiatric/Behavioral: Negative.   14 point review of systems was performed and is negative except as detailed under history of present illness and above   PAST MEDICAL HISTORY:   Past Medical History:  Diagnosis Date  . Acute cystitis   . Anxiety   . Depression   . Diabetes mellitus type II   . Diabetic retinopathy   . DM type 2 causing CKD stage 3 ([place])   . Fibromyalgia   . GERD (gastroesophageal reflux disease)   . Hyperlipidemia   . Hypertension   . IBS (irritable bowel syndrome)   . IBS (irritable bowel syndrome)   . Insomnia   . Lichen planus    vulvar  . Pancreatitis, gallstone   . Postmenopausal   . Radial scar of breast 06/04/2016  . Urge incontinence of urine  ALLERGIES: Allergies  Allergen Reactions  . Latex Anaphylaxis and Rash  . Ambien [Zolpidem] Other (See Comments)    Hallucinations  . Augmentin [Amoxicillin-Pot Clavulanate] Nausea And Vomiting  . Belsomra [Suvorexant] Other  (See Comments)    Hallucinations  . Hydrocodone-Acetaminophen Other (See Comments)    hallucinations  . Hyoscyamine Sulfate Other (See Comments)  . Melatonin Other (See Comments)    Adverse reaction-"keeps me awake"  . Mobic [Meloxicam]   . Morphine Nausea And Vomiting    REACTION: hallucinations,GI upset  . Oxycodone Other (See Comments)    Hallucinations  . Tramadol     Unknown reaction  . Celebrex [Celecoxib] Itching and Rash  . Sulfa Antibiotics Rash      MEDICATIONS: I have reviewed the patient's current medications.    Current Outpatient Prescriptions on File Prior to Visit  Medication Sig Dispense Refill  . acetaminophen (TYLENOL) 325 MG tablet Take 325 mg by mouth as needed.    Marland Kitchen aspirin 81 MG tablet Take 81 mg by mouth every other day.     Marland Kitchen atorvastatin (LIPITOR) 20 MG tablet Take 0.5 tablets (10 mg total) by mouth at bedtime. 90 tablet 3  . Calcium Carb-Cholecalciferol (CALCIUM 600+D3) 600-200 MG-UNIT TABS Take 1 tablet by mouth 2 (two) times daily.      . cephALEXin (KEFLEX) 500 MG capsule Take 1 capsule by mouth daily.    Marland Kitchen desonide (DESOWEN) 0.05 % ointment Apply 1 application topically 2 (two) times daily.     . diclofenac sodium (VOLTAREN) 1 % GEL Apply 4 g topically 4 (four) times daily. 100 g 3  . Dulaglutide (TRULICITY) 1.5 QP/5.9FM SOPN Inject 1.5 mg into the skin once a week. 12 pen 3  . escitalopram (LEXAPRO) 20 MG tablet Take 1 tablet (20 mg total) by mouth daily. 90 tablet 3  . esomeprazole (NEXIUM) 20 MG capsule Take 1 capsule (20 mg total) by mouth daily at 12 noon. 90 capsule 3  . estradiol (ESTRACE) 0.1 MG/GM vaginal cream Place 2 g vaginally every Monday, Wednesday, and Friday.     . fluticasone (FLONASE) 50 MCG/ACT nasal spray Place 2 sprays into both nostrils daily. 16 g 6  . glucose blood (FREESTYLE LITE) test strip Use qid to check blood sugar. Dx E11.9 200 each 11  . insulin aspart (NOVOLOG FLEXPEN) 100 UNIT/ML FlexPen Inject prior to each meal per  sliding scale.BG less than 80 - no insulin; 80 to 120 - 3 units; 121 to 150 - 4 units; 151 to 200 - 5 units; 201 to 250 - 6 units; 250 or above - 7 units and call office. 30 mL 3  . Insulin Glargine (LANTUS SOLOSTAR) 100 UNIT/ML Solostar Pen Inject 32 Units into the skin every morning. 45 mL 3  . Insulin Pen Needle 32G X 6 MM MISC 1 each by Does not apply route 4 (four) times daily. 200 each 11  . losartan (COZAAR) 25 MG tablet Take 0.5 tablets (12.5 mg total) by mouth daily. 90 tablet 1  . mirabegron ER (MYRBETRIQ) 50 MG TB24 tablet Take 50 mg by mouth daily.    . Multiple Vitamin (MULTIVITAMIN) tablet Take 1 tablet by mouth daily.     Marland Kitchen nystatin (NYSTATIN) powder APPLY UNDER BILATERAL BREASTS AND GROIN TWICE A DAY 60 g 3  . nystatin-triamcinolone ointment (MYCOLOG) APPLY TOPICALLY 2 (TWO) TIMES DAILY. 60 g 2  . ondansetron (ZOFRAN) 4 MG tablet Take 1 tablet (4 mg total) by mouth every 8 (eight) hours as  needed for nausea or vomiting. 20 tablet 0  . Polyethyl Glycol-Propyl Glycol 0.4-0.3 % SOLN Apply 1 drop to eye daily as needed (for dry eye relief).     . polyethylene glycol (MIRALAX / GLYCOLAX) packet Take 17 g by mouth daily as needed.     . Probiotic Product (PROBIOTIC ADVANCED PO) Take 1 capsule by mouth daily.     . RESTASIS MULTIDOSE 0.05 % ophthalmic emulsion INSTILL 1 DROP INTO BOTH EYES 2 TIMES A DAY  3  . traZODone (DESYREL) 150 MG tablet Take 0.5 tablets (75 mg total) by mouth at bedtime. 45 tablet 3   No current facility-administered medications on file prior to visit.      PAST SURGICAL HISTORY Past Surgical History:  Procedure Laterality Date  . ANKLE SURGERY  02/2004  . BREAST LUMPECTOMY WITH RADIOACTIVE SEED LOCALIZATION Left 01/22/2016   Procedure: LEFT BREAST LUMPECTOMY WITH RADIOACTIVE SEED LOCALIZATION;  Surgeon: Autumn Messing III, MD;  Location: [facility];  Service: General;  Laterality: Left;  . CATARACT EXTRACTION W/ INTRAOCULAR LENS IMPLANT  09/2003,  12/2003  . CHOLECYSTECTOMY  2004  . EYE SURGERY     laser treatments  . JOINT REPLACEMENT    . TOTAL KNEE ARTHROPLASTY     Left  . TRIGGER FINGER RELEASE Left 01/30/2002  . TUBAL LIGATION  1970    FAMILY HISTORY: Family History  Problem Relation Age of Onset  . Diabetes Mother   . Hypertension Mother   . Kidney disease Mother   . Heart disease Mother   . Peripheral vascular disease Mother        amputation  . Cancer Father        prostate  . Diabetes Father   . Heart disease Father        CABG  . Hypertension Father   . Heart attack Father   . Depression Sister   . Hypertension Sister   . Neuropathy Sister   Her husband is deceased 65 years secondary to prostate cancer. 3 children. Daughter lives in [place], [place]. Son lives in [place], [place]. She has another son who lives locally.  SOCIAL HISTORY:  reports that she has never smoked. She has never used smokeless tobacco. She reports that she does not drink alcohol or use drugs.  She is widowed 8 years. Her husband is deceased secondary to prostate cancer. She does have 3 children. She is [place] and religion. He used to work as a bookkeeper/accounting.  Social History   Social History  . Marital status: Widowed    Spouse name: N/A  . Number of children: N/A  . Years of education: N/A   Occupational History  . retired Retired   Social History Main Topics  . Smoking status: Never Smoker  . Smokeless tobacco: Never Used  . Alcohol use No  . Drug use: No  . Sexual activity: No   Other Topics Concern  . None   Social History Narrative   Widowed in 2010    PERFORMANCE STATUS: The patient's performance status is 0 - Asymptomatic  PHYSICAL EXAM: Most Recent Vital Signs: Blood pressure (!) 153/55, pulse 81, resp. rate 16, height 5' 3.5" (1.613 m), weight 186 lb (84.4 kg), SpO2 100 %. BP (!) 153/55 (BP Location: Left Arm, Patient Position: Sitting)   Pulse 81   Resp 16   Ht 5'  3.5" (1.613 m)   Wt 186 lb (84.4 kg)   SpO2 100%   BMI 32.43  kg/m   General Appearance:    Alert, cooperative, no distress, appears stated age, accompanied by her sister, Becky  Head:    Normocephalic, without obvious abnormality, atraumatic  Eyes:    conjunctiva/corneas clear, EOM's intact  Ears:    Normal external ear bilaterallly.  Nose:   Nares normal, septum midline, mucosa normal, no drainage    or sinus tenderness  Throat:   Lips, mucosa, and tongue normal  Neck:   Supple, symmetrical, trachea midline, no adenopathy;    thyroid:  no enlargement/tenderness/nodules.  Back:     Symmetric, no curvature, ROM normal, no CVA tenderness  Lungs:     Clear to auscultation bilaterally, respirations unlabored  Chest Wall:    No tenderness or deformity   Heart:    Regular rate and rhythm, S1 and S2 normal, no murmur, rub   or gallop  Breast Exam:    Right breast without any palpable abnormalities or skin changes. Left breast with upper outer quadrant lumpectomy scar site well healed without any signs of infection. No palpable left breast abnormalities or skin changes. Chronic nipple inversion noted on left.   Abdomen:     Soft, non-tender, bowel sounds active all four quadrants,    no masses, no organomegaly  Genitalia:    Rectal:    Extremities:   Extremities normal, atraumatic, no cyanosis or edema  Pulses:   2+ and symmetric all extremities  Skin:   Skin color, texture, turgor normal, no rashes or lesions  Lymph nodes:   Cervical, supraclavicular, and axillary nodes normal  Neurologic:   CNII-XII intact, normal strength, sensation and reflexes    throughout     LABORATORY DATA:  CBC    Component Value Date/Time   WBC 5.0 04/08/2017 1538   WBC 13.1 (H) 09/24/2016 2133   RBC 4.21 04/08/2017 1538   RBC 4.37 09/24/2016 2133   HGB 13.1 04/08/2017 1538   HCT 39.7 04/08/2017 1538   PLT 167 04/08/2017 1538   MCV 94 04/08/2017 1538   MCH 31.1 04/08/2017 1538   MCH 31.6 09/24/2016  2133   MCHC 33.0 04/08/2017 1538   MCHC 34.9 09/24/2016 2133   RDW 13.8 04/08/2017 1538   LYMPHSABS 1.6 04/08/2017 1538   MONOABS 1.0 09/24/2016 2133   EOSABS 0.1 04/08/2017 1538   BASOSABS 0.0 04/08/2017 1538     Chemistry      Component Value Date/Time   NA 136 04/08/2017 1538   K 4.7 04/08/2017 1538   CL 95 (L) 04/08/2017 1538   CO2 28 04/08/2017 1538   BUN 21 04/08/2017 1538   CREATININE 1.12 (H) 04/08/2017 1538   CREATININE 1.18 (H) 03/23/2013 1327      Component Value Date/Time   CALCIUM 9.0 04/08/2017 1538   CALCIUM 9.4 10/22/2009 1414   ALKPHOS 59 04/08/2017 1538   AST 24 04/08/2017 1538   ALT 17 04/08/2017 1538   BILITOT 0.3 04/08/2017 1538      RADIOGRAPHY: CLINICAL DATA:  Patient presents for needle localization of left breast lesion prior to excision.  EXAM: MAMMOGRAPHIC GUIDED RADIOACTIVE SEED LOCALIZATION OF THE LEFT BREAST  COMPARISON:  12/10/2015 and earlier  FINDINGS: Patient presents for radioactive seed localization prior to excision. I met with the patient and we discussed the procedure of seed localization including benefits and alternatives. We discussed the high likelihood of a successful procedure. We discussed the risks of the procedure including infection, bleeding, tissue injury and further surgery. We discussed the low dose of radioactivity  involved in the procedure. Informed, written consent was given.  The usual time-out protocol was performed immediately prior to the procedure.  Using mammographic guidance, sterile technique, 1% lidocaine and an I-125 radioactive seed, clip in the upper portion of the left breast was localized using a cephalad approach. The follow-up mammogram images confirm the seed in the expected location and were marked for Dr. Marlou Starks.  Follow-up survey of the patient confirms presence of the radioactive seed.  Order number of I-125 seed:  [id number].  Total activity:  1.601 millicuries  Reference  Date: 01/06/2016  The patient tolerated the procedure well and was released from the [facility]. She was given instructions regarding seed removal.  IMPRESSION: Radioactive seed localization of the left breast. No apparent complications.   Electronically Signed   By: Nolon Nations M.D.   On: 01/21/2016 13:50   PATHOLOGY:    Diagnosis Breast, lumpectomy, Left Breast Tissue w/seed - COMPLEX SCLEROSING LESION WITH USUAL DUCTAL HYPERPLASIA. - FIBROCYSTIC CHANGES WITH CALCIFICATIONS. - BIOPSY SITE CHANGES. - NO MALIGNANCY IDENTIFIED. Claudette Laws MD Pathologist, Electronic Signature (Case signed 01/26/2016)  ASSESSMENT/PLAN:   Radial scar of L breast Complex sclerosing lesion/radial scar showing fibrocystic changes with usual ductal hyperplasia with associated calcifications biopsy proven on 12/12/2015 followed by a left breast lumpectomy by Dr. Marlou Starks III on 01/22/2016 again confirming aforementioned diagnosis without any identified malignancy. Of note, she has chronic left nipple inversion times many years. There was no evidence of atypical ductal hyperplasia or other high risk breast lesion.  Radial scars, also called complex sclerosing lesions, are a pathologic diagnosis, usually discovered incidentally when a breast mass or radiologic abnormality is removed or biopsied. Occasionally, radial scars are large enough to be detected by mammography, which cannot reliably differentiate between these lesions and spiculated carcinoma. Radial scars are characterized microscopically by a fibroelastic core with radiating ducts and lobules.  There is ongoing controversy about the need for surgical excision when radial scars are found on core biopsy.  Usually these are excised since most series show that 8 to 17 percent of surgical specimens at subsequent excision are positive for malignancy. In addition to the possibility of finding an unrecognized in situ or invasive component, there is some  evidence that radial scars may be premalignant lesions, meaning that they can slowly progress from scar to hyperplasia to carcinoma over time.  No additional treatment beyond excision is needed for radial scars. The risk of subsequent breast cancer after excision in this population is small, and chemoprevention is not indicated.  Clinically NED on breast exam today.  Surveillance includes annual mammogram in addition to annual breast exam. Patient states her gynecologist orders her mammograms.  Mammogram is due in July  2019.  No role for labs from an oncology perspective.  Return in 1 year for follow-up.  All questions were answered. The patient knows to call the clinic with any problems, questions or concerns. We can certainly see the patient much sooner if necessary.  This note is electronically signed [id number] Talbert Cage, MD  07/12/2017 1:49 PM

## 2017-07-14 ENCOUNTER — Ambulatory Visit (INDEPENDENT_AMBULATORY_CARE_PROVIDER_SITE_OTHER): Payer: Medicare Other | Admitting: Family Medicine

## 2017-07-14 VITALS — BP 139/67 | HR 76 | Temp 97.0°F | Ht 63.5 in | Wt 184.2 lb

## 2017-07-14 DIAGNOSIS — E1142 Type 2 diabetes mellitus with diabetic polyneuropathy: Secondary | ICD-10-CM

## 2017-07-14 DIAGNOSIS — Z794 Long term (current) use of insulin: Secondary | ICD-10-CM | POA: Diagnosis not present

## 2017-07-14 DIAGNOSIS — E785 Hyperlipidemia, unspecified: Secondary | ICD-10-CM | POA: Diagnosis not present

## 2017-07-14 DIAGNOSIS — E119 Type 2 diabetes mellitus without complications: Secondary | ICD-10-CM | POA: Diagnosis not present

## 2017-07-14 LAB — BAYER DCA HB A1C WAIVED: HB A1C: 6.7 % (ref <7.0)

## 2017-07-14 MED ORDER — ATORVASTATIN CALCIUM 20 MG PO TABS: 10.0000 mg | ORAL_TABLET | Freq: Every day | ORAL | 3 refills | Status: DC

## 2017-07-14 NOTE — Progress Notes (Signed)
   HPI  Patient presents today here for follow-up of chronic medical conditions.  Hyperlipidemia Patient watching diet, needs refill of Lipitor.  Type 2 diabetes Average fasting blood sugar is in the 130s, ranging 110-150. Hyperglycemia, mild, 60s 4-5 episodes  Diabetic neuropathy Numbness and tingling in the toes and fingers. Patient states it's tolerable, does not want medications at this time.  PMH: Smoking status noted ROS: Per HPI  Objective: BP 139/67   Pulse 76   Temp (!) 97 F (36.1 C) (Oral)   Ht 5' 3.5" (1.613 m)   Wt 184 lb 3.2 oz (83.6 kg)   BMI 32.12 kg/m  Gen: NAD, alert, cooperative with exam HEENT: NCAT, EOMI, PERRL CV: RRR, good S1/S2, no murmur Resp: CTABL, no wheezes, non-labored Ext: No edema, warm Neuro: Alert and oriented, No gross deficits  Diabetic Foot Exam - Simple   Simple Foot Form Diabetic Foot exam was performed with the following findings:  Yes 07/14/2017  3:57 PM  Visual Inspection No deformities, no ulcerations, no other skin breakdown bilaterally:  Yes Sensation Testing See comments:  Yes Pulse Check Posterior Tibialis and Dorsalis pulse intact bilaterally:  Yes Comments Right heel with sensation absent to monofilament, otherwise intact      Assessment and plan:  # Type 2 diabetes Well-controlled Given mild lows I have reduced her Lantus dose., 30 units once daily. Continue sliding-scale   # Diabetic neuropathy Discussed gabapentin, she would like to wait, symptoms stable.  She does have an upcoming appointment with endocrinology  # Hyperlipidemia Refill Lipitor, tolerating well. Labs are up-to-date   Orders Placed This Encounter  Procedures  . Bayer DCA Hb A1c Waived  . CMP14+EGFR    Meds ordered this encounter  Medications  . atorvastatin (LIPITOR) 20 MG tablet    Sig: Take 0.5 tablets (10 mg total) by mouth at bedtime.    Dispense:  90 tablet    Refill:  [place], MD [facility] 07/14/2017, 3:59 PM

## 2017-07-14 NOTE — Patient Instructions (Signed)
Great to see you!  Lets follow up in 3-4 months unless you need [place] sooner.   Lets change your lantus to 30 units once daily.

## 2017-07-15 LAB — CMP14+EGFR
ALK PHOS: 62 IU/L (ref 39–117)
ALT: 15 IU/L (ref 0–32)
AST: 25 IU/L (ref 0–40)
Albumin/Globulin Ratio: 1.5 (ref 1.2–2.2)
Albumin: 3.8 g/dL (ref 3.5–4.8)
BILIRUBIN TOTAL: 0.4 mg/dL (ref 0.0–1.2)
BUN/Creatinine Ratio: 19 (ref 12–28)
BUN: 21 mg/dL (ref 8–27)
CHLORIDE: 98 mmol/L (ref 96–106)
CO2: 27 mmol/L (ref 20–29)
Calcium: 9.1 mg/dL (ref 8.7–10.3)
Creatinine, Ser: 1.13 mg/dL — ABNORMAL HIGH (ref 0.57–1.00)
GFR calc Af Amer: 54 mL/min/{1.73_m2} — ABNORMAL LOW (ref >59)
GFR calc non Af Amer: 47 mL/min/{1.73_m2} — ABNORMAL LOW (ref >59)
GLOBULIN, TOTAL: 2.6 g/dL (ref 1.5–4.5)
GLUCOSE: 189 mg/dL — AB (ref 65–99)
Potassium: 4.4 mmol/L (ref 3.5–5.2)
Sodium: 138 mmol/L (ref 134–144)
Total Protein: 6.4 g/dL (ref 6.0–8.5)

## 2017-08-02 DIAGNOSIS — E1142 Type 2 diabetes mellitus with diabetic polyneuropathy: Secondary | ICD-10-CM | POA: Diagnosis not present

## 2017-08-02 DIAGNOSIS — B351 Tinea unguium: Secondary | ICD-10-CM | POA: Diagnosis not present

## 2017-08-02 DIAGNOSIS — M79676 Pain in unspecified toe(s): Secondary | ICD-10-CM | POA: Diagnosis not present

## 2017-08-02 DIAGNOSIS — L84 Corns and callosities: Secondary | ICD-10-CM | POA: Diagnosis not present

## 2017-08-09 ENCOUNTER — Ambulatory Visit: Payer: Self-pay | Admitting: Pharmacist

## 2017-08-15 DIAGNOSIS — E1140 Type 2 diabetes mellitus with diabetic neuropathy, unspecified: Secondary | ICD-10-CM | POA: Diagnosis not present

## 2017-08-15 DIAGNOSIS — Z794 Long term (current) use of insulin: Secondary | ICD-10-CM | POA: Diagnosis not present

## 2017-09-12 DIAGNOSIS — H40033 Anatomical narrow angle, bilateral: Secondary | ICD-10-CM | POA: Diagnosis not present

## 2017-09-12 DIAGNOSIS — H2513 Age-related nuclear cataract, bilateral: Secondary | ICD-10-CM | POA: Diagnosis not present

## 2017-10-10 ENCOUNTER — Ambulatory Visit (INDEPENDENT_AMBULATORY_CARE_PROVIDER_SITE_OTHER): Payer: Medicare Other | Admitting: Ophthalmology

## 2017-10-10 DIAGNOSIS — E11319 Type 2 diabetes mellitus with unspecified diabetic retinopathy without macular edema: Secondary | ICD-10-CM

## 2017-10-10 DIAGNOSIS — H43813 Vitreous degeneration, bilateral: Secondary | ICD-10-CM

## 2017-10-10 DIAGNOSIS — H35033 Hypertensive retinopathy, bilateral: Secondary | ICD-10-CM

## 2017-10-10 DIAGNOSIS — I10 Essential (primary) hypertension: Secondary | ICD-10-CM | POA: Diagnosis not present

## 2017-10-10 DIAGNOSIS — E113393 Type 2 diabetes mellitus with moderate nonproliferative diabetic retinopathy without macular edema, bilateral: Secondary | ICD-10-CM

## 2017-10-13 ENCOUNTER — Encounter: Payer: Self-pay | Admitting: Family Medicine

## 2017-10-13 ENCOUNTER — Ambulatory Visit (INDEPENDENT_AMBULATORY_CARE_PROVIDER_SITE_OTHER): Payer: Medicare Other | Admitting: Family Medicine

## 2017-10-13 VITALS — BP 140/73 | HR 73 | Temp 97.8°F | Ht 63.5 in | Wt 181.6 lb

## 2017-10-13 DIAGNOSIS — B372 Candidiasis of skin and nail: Secondary | ICD-10-CM | POA: Diagnosis not present

## 2017-10-13 DIAGNOSIS — E119 Type 2 diabetes mellitus without complications: Secondary | ICD-10-CM | POA: Diagnosis not present

## 2017-10-13 DIAGNOSIS — Z794 Long term (current) use of insulin: Secondary | ICD-10-CM | POA: Diagnosis not present

## 2017-10-13 DIAGNOSIS — N183 Chronic kidney disease, stage 3 (moderate): Secondary | ICD-10-CM | POA: Diagnosis not present

## 2017-10-13 DIAGNOSIS — F411 Generalized anxiety disorder: Secondary | ICD-10-CM

## 2017-10-13 DIAGNOSIS — E1122 Type 2 diabetes mellitus with diabetic chronic kidney disease: Secondary | ICD-10-CM | POA: Diagnosis not present

## 2017-10-13 DIAGNOSIS — I10 Essential (primary) hypertension: Secondary | ICD-10-CM

## 2017-10-13 DIAGNOSIS — Z23 Encounter for immunization: Secondary | ICD-10-CM | POA: Diagnosis not present

## 2017-10-13 LAB — BAYER DCA HB A1C WAIVED: HB A1C: 6.4 % (ref <7.0)

## 2017-10-13 MED ORDER — INSULIN GLARGINE 100 UNIT/ML SOLOSTAR PEN: 32.0000 [IU] | PEN_INJECTOR | SUBCUTANEOUS | 3 refills | Status: DC

## 2017-10-13 MED ORDER — NYSTATIN-TRIAMCINOLONE 100000-0.1 UNIT/GM-% EX OINT: TOPICAL_OINTMENT | CUTANEOUS | 2 refills | Status: DC

## 2017-10-13 MED ORDER — DICLOFENAC SODIUM 1 % TD GEL: 4.0000 g | Freq: Four times a day (QID) | TRANSDERMAL | 3 refills | Status: DC

## 2017-10-13 MED ORDER — INSULIN ASPART 100 UNIT/ML FLEXPEN
PEN_INJECTOR | SUBCUTANEOUS | 3 refills | Status: DC
Start: 2017-10-13 — End: 2018-12-05

## 2017-10-13 MED ORDER — INSULIN PEN NEEDLE 31G X 5 MM MISC: 1.0000 | Freq: Four times a day (QID) | 4 refills | Status: DC

## 2017-10-13 NOTE — Patient Instructions (Addendum)
Great to see you!  Come back in 4 months unless you need us sooner.    

## 2017-10-13 NOTE — Progress Notes (Signed)
   HPI  Patient presents today to follow-up for chronic medical conditions.  Type 2 diabetes Patient has met the new endocrinologist and really likes him.  She felt very well cared for.  He has adjusted her sliding scale back. She has still had 3 episodes of hypoglycemia but has very good awareness.  She recovers very quickly.  Anxiety Well-controlled on current medications  Hypertension No headache or chest pain, good medication compliance   PMH: Smoking status noted ROS: Per HPI  Objective: BP 140/73   Pulse 73   Temp 97.8 F (36.6 C) (Oral)   Ht 5' 3.5" (1.613 m)   Wt 181 lb 9.6 oz (82.4 kg)   BMI 31.66 kg/m  Gen: NAD, alert, cooperative with exam HEENT: NCAT CV: RRR, good S1/S2, no murmur Resp: CTABL, no wheezes, non-labored Ext: No edema, warm Neuro: Alert and oriented, No gross deficits  Diabetic Foot Exam - Simple   Simple Foot Form Diabetic Foot exam was performed with the following findings:  Yes 10/13/2017  3:37 PM  Visual Inspection No deformities, no ulcerations, no other skin breakdown bilaterally:  Yes Sensation Testing Intact to touch and monofilament testing bilaterally:  Yes Pulse Check Posterior Tibialis and Dorsalis pulse intact bilaterally:  Yes Comments      Assessment and plan:  #Type 2 diabetes Well controlled, patient will begin primarily being managed by endocrinology. No changes Patient has had 3 episodes of hypoglycemia with very good awareness  #Anxiety Doing well No changes, continue Lexapro  #Hypertension Well-controlled, no changes Labs  #Skin yeast infection-refilled topical cream    Orders Placed This Encounter  Procedures  . Bayer DCA Hb A1c Waived  . CMP14+EGFR  . TSH    Meds ordered this encounter  Medications  . nystatin-triamcinolone ointment (MYCOLOG)    Sig: APPLY TOPICALLY 2 (TWO) TIMES DAILY.    Dispense:  60 g    Refill:  2  . diclofenac sodium (VOLTAREN) 1 % GEL    Sig: Apply 4 g topically 4  (four) times daily.    Dispense:  100 g    Refill:  3  . Insulin Glargine (LANTUS SOLOSTAR) 100 UNIT/ML Solostar Pen    Sig: Inject 32 Units into the skin every morning.    Dispense:  45 mL    Refill:  3  . insulin aspart (NOVOLOG FLEXPEN) 100 UNIT/ML FlexPen    Sig: BG less than 80 - no insulin; 80 to 120 - 3 units; 121 to 150 - 4 units; 151 to 200 - 5 units; 201 to 250 - 6 units; 250 or above - 7 units    Dispense:  30 mL    Refill:  3    DIRECTIONS UPDATED  . Insulin Pen Needle 31G X 5 MM MISC    Sig: 1 Device by Does not apply route 4 (four) times daily.    Dispense:  300 each    Refill:  [place], [facility] 10/13/2017, 3:38 PM

## 2017-10-14 LAB — CMP14+EGFR
A/G RATIO: 1.6 (ref 1.2–2.2)
ALBUMIN: 3.9 g/dL (ref 3.5–4.8)
ALK PHOS: 61 IU/L (ref 39–117)
ALT: 17 IU/L (ref 0–32)
AST: 26 IU/L (ref 0–40)
BILIRUBIN TOTAL: 0.3 mg/dL (ref 0.0–1.2)
BUN / CREAT RATIO: 20 (ref 12–28)
BUN: 24 mg/dL (ref 8–27)
CO2: 28 mmol/L (ref 20–29)
CREATININE: 1.18 mg/dL — AB (ref 0.57–1.00)
Calcium: 9 mg/dL (ref 8.7–10.3)
Chloride: 100 mmol/L (ref 96–106)
GFR calc Af Amer: 51 mL/min/{1.73_m2} — ABNORMAL LOW (ref >59)
GFR calc non Af Amer: 45 mL/min/{1.73_m2} — ABNORMAL LOW (ref >59)
GLOBULIN, TOTAL: 2.4 g/dL (ref 1.5–4.5)
Glucose: 146 mg/dL — ABNORMAL HIGH (ref 65–99)
POTASSIUM: 3.7 mmol/L (ref 3.5–5.2)
SODIUM: 140 mmol/L (ref 134–144)
Total Protein: 6.3 g/dL (ref 6.0–8.5)

## 2017-10-14 LAB — TSH: TSH: 1.73 u[IU]/mL (ref 0.450–4.500)

## 2017-11-01 DIAGNOSIS — E1142 Type 2 diabetes mellitus with diabetic polyneuropathy: Secondary | ICD-10-CM | POA: Diagnosis not present

## 2017-11-01 DIAGNOSIS — L84 Corns and callosities: Secondary | ICD-10-CM | POA: Diagnosis not present

## 2017-11-01 DIAGNOSIS — B351 Tinea unguium: Secondary | ICD-10-CM | POA: Diagnosis not present

## 2017-11-01 DIAGNOSIS — M79672 Pain in left foot: Secondary | ICD-10-CM | POA: Diagnosis not present

## 2017-11-23 DIAGNOSIS — E1140 Type 2 diabetes mellitus with diabetic neuropathy, unspecified: Secondary | ICD-10-CM | POA: Diagnosis not present

## 2017-11-23 DIAGNOSIS — E1165 Type 2 diabetes mellitus with hyperglycemia: Secondary | ICD-10-CM | POA: Diagnosis not present

## 2018-01-12 DIAGNOSIS — L258 Unspecified contact dermatitis due to other agents: Secondary | ICD-10-CM | POA: Diagnosis not present

## 2018-01-23 ENCOUNTER — Ambulatory Visit: Payer: Self-pay | Admitting: Pharmacist

## 2018-01-24 DIAGNOSIS — B351 Tinea unguium: Secondary | ICD-10-CM | POA: Diagnosis not present

## 2018-01-24 DIAGNOSIS — L84 Corns and callosities: Secondary | ICD-10-CM | POA: Diagnosis not present

## 2018-01-24 DIAGNOSIS — M79676 Pain in unspecified toe(s): Secondary | ICD-10-CM | POA: Diagnosis not present

## 2018-01-24 DIAGNOSIS — E1142 Type 2 diabetes mellitus with diabetic polyneuropathy: Secondary | ICD-10-CM | POA: Diagnosis not present

## 2018-01-26 DIAGNOSIS — N3946 Mixed incontinence: Secondary | ICD-10-CM | POA: Diagnosis not present

## 2018-01-26 DIAGNOSIS — N3020 Other chronic cystitis without hematuria: Secondary | ICD-10-CM | POA: Diagnosis not present

## 2018-01-30 ENCOUNTER — Other Ambulatory Visit: Payer: Self-pay | Admitting: Family Medicine

## 2018-01-30 DIAGNOSIS — F3289 Other specified depressive episodes: Secondary | ICD-10-CM

## 2018-01-30 MED ORDER — ESCITALOPRAM OXALATE 20 MG PO TABS: 20.0000 mg | ORAL_TABLET | Freq: Every day | ORAL | 2 refills | Status: DC

## 2018-01-30 NOTE — Addendum Note (Signed)
Addended by: Timmothy Euler on: 01/30/2018 02:49 PM   Modules accepted: Orders

## 2018-02-14 ENCOUNTER — Ambulatory Visit: Payer: Medicare Other | Admitting: Family Medicine

## 2018-02-20 ENCOUNTER — Ambulatory Visit: Payer: Medicare Other | Admitting: Family Medicine

## 2018-02-23 ENCOUNTER — Encounter: Payer: Self-pay | Admitting: Family Medicine

## 2018-02-23 ENCOUNTER — Ambulatory Visit (INDEPENDENT_AMBULATORY_CARE_PROVIDER_SITE_OTHER): Payer: Medicare Other | Admitting: Family Medicine

## 2018-02-23 VITALS — BP 116/66 | HR 79 | Temp 99.2°F | Ht 63.5 in | Wt 173.0 lb

## 2018-02-23 DIAGNOSIS — I10 Essential (primary) hypertension: Secondary | ICD-10-CM

## 2018-02-23 DIAGNOSIS — E119 Type 2 diabetes mellitus without complications: Secondary | ICD-10-CM

## 2018-02-23 DIAGNOSIS — Z794 Long term (current) use of insulin: Secondary | ICD-10-CM

## 2018-02-23 DIAGNOSIS — B372 Candidiasis of skin and nail: Secondary | ICD-10-CM | POA: Diagnosis not present

## 2018-02-23 LAB — BAYER DCA HB A1C WAIVED: HB A1C: 6 % (ref <7.0)

## 2018-02-23 MED ORDER — DULAGLUTIDE 1.5 MG/0.5ML ~~LOC~~ SOAJ: 1.5000 mg | SUBCUTANEOUS | 3 refills | Status: DC

## 2018-02-23 MED ORDER — LISINOPRIL 5 MG PO TABS: 5.0000 mg | ORAL_TABLET | Freq: Every day | ORAL | 3 refills | Status: DC

## 2018-02-23 MED ORDER — GLUCOSE BLOOD VI STRP: ORAL_STRIP | 11 refills | Status: DC

## 2018-02-23 MED ORDER — NYSTATIN-TRIAMCINOLONE 100000-0.1 UNIT/GM-% EX OINT: TOPICAL_OINTMENT | CUTANEOUS | 2 refills | Status: DC

## 2018-02-23 NOTE — Progress Notes (Signed)
   HPI  Patient presents today for follow-up of chronic medical conditions.  Hypertension Patient's losartan has been recalled, we discussed an alternative.  Diabetes Patient is very happy with her new endocrinologist, she is using a continuous glucose monitor. Average fasting blood sugar has been 100-130 No significant hypoglycemia  PMH: Smoking status noted ROS: Per HPI  Objective: BP 116/66   Pulse 79   Temp 99.2 F (37.3 C) (Oral)   Ht 5' 3.5" (1.613 m)   Wt 173 lb (78.5 kg)   BMI 30.16 kg/m  Gen: NAD, alert, cooperative with exam HEENT: NCAT CV: RRR, good S1/S2, no murmur Resp: CTABL, no wheezes, non-labored Ext: No edema, warm Neuro: Alert and oriented, No gross deficits  Assessment and plan:  #Type 2 diabetes Very well controlled, patient is doing well with endocrinology A1c 6.0, will try to defer further A1c checks to her endocrinologist.  #Hypertension Very well controlled, changing losartan 12.5 mg to lisinopril 5 mg   #Intertrigo -skin yeast infection Stable Refill nystatin cream     Orders Placed This Encounter  Procedures  . Bayer DCA Hb A1c Waived  . CMP14+EGFR  . CBC with Differential/Platelet  . Lipid panel    Meds ordered this encounter  Medications  . nystatin-triamcinolone ointment (MYCOLOG)    Sig: APPLY TOPICALLY 2 (TWO) TIMES DAILY.    Dispense:  60 g    Refill:  2  . glucose blood (FREESTYLE LITE) test strip    Sig: Use qid to check blood sugar. Dx E11.9    Dispense:  200 each    Refill:  11  . Dulaglutide (TRULICITY) 1.5 NT/6.1WE SOPN    Sig: Inject 1.5 mg into the skin once a week.    Dispense:  12 pen    Refill:  3  . lisinopril (PRINIVIL,ZESTRIL) 5 MG tablet    Sig: Take 1 tablet (5 mg total) by mouth daily.    Dispense:  90 tablet    Refill:  [place], MD [place] 02/23/2018, 4:51 PM

## 2018-02-24 LAB — CBC WITH DIFFERENTIAL/PLATELET
BASOS: 0 %
Basophils Absolute: 0 10*3/uL (ref 0.0–0.2)
EOS (ABSOLUTE): 0.1 10*3/uL (ref 0.0–0.4)
EOS: 2 %
HEMATOCRIT: 39.3 % (ref 34.0–46.6)
HEMOGLOBIN: 13 g/dL (ref 11.1–15.9)
IMMATURE GRANS (ABS): 0 10*3/uL (ref 0.0–0.1)
Immature Granulocytes: 0 %
LYMPHS: 30 %
Lymphocytes Absolute: 1.9 10*3/uL (ref 0.7–3.1)
MCH: 31 pg (ref 26.6–33.0)
MCHC: 33.1 g/dL (ref 31.5–35.7)
MCV: 94 fL (ref 79–97)
MONOCYTES: 8 %
Monocytes Absolute: 0.5 10*3/uL (ref 0.1–0.9)
NEUTROS ABS: 3.8 10*3/uL (ref 1.4–7.0)
Neutrophils: 60 %
Platelets: 177 10*3/uL (ref 150–379)
RBC: 4.19 x10E6/uL (ref 3.77–5.28)
RDW: 13.8 % (ref 12.3–15.4)
WBC: 6.4 10*3/uL (ref 3.4–10.8)

## 2018-02-24 LAB — CMP14+EGFR
ALBUMIN: 3.9 g/dL (ref 3.5–4.8)
ALT: 17 IU/L (ref 0–32)
AST: 22 IU/L (ref 0–40)
Albumin/Globulin Ratio: 1.8 (ref 1.2–2.2)
Alkaline Phosphatase: 49 IU/L (ref 39–117)
BILIRUBIN TOTAL: 0.2 mg/dL (ref 0.0–1.2)
BUN / CREAT RATIO: 19 (ref 12–28)
BUN: 24 mg/dL (ref 8–27)
CALCIUM: 8.7 mg/dL (ref 8.7–10.3)
CO2: 24 mmol/L (ref 20–29)
CREATININE: 1.24 mg/dL — AB (ref 0.57–1.00)
Chloride: 100 mmol/L (ref 96–106)
GFR, EST AFRICAN AMERICAN: 48 mL/min/{1.73_m2} — AB (ref >59)
GFR, EST NON AFRICAN AMERICAN: 42 mL/min/{1.73_m2} — AB (ref >59)
Globulin, Total: 2.2 g/dL (ref 1.5–4.5)
Glucose: 142 mg/dL — ABNORMAL HIGH (ref 65–99)
Potassium: 4 mmol/L (ref 3.5–5.2)
Sodium: 138 mmol/L (ref 134–144)
TOTAL PROTEIN: 6.1 g/dL (ref 6.0–8.5)

## 2018-02-24 LAB — LIPID PANEL
CHOL/HDL RATIO: 2.2 ratio (ref 0.0–4.4)
Cholesterol, Total: 133 mg/dL (ref 100–199)
HDL: 61 mg/dL (ref >39)
LDL CALC: 43 mg/dL (ref 0–99)
TRIGLYCERIDES: 143 mg/dL (ref 0–149)
VLDL CHOLESTEROL CAL: 29 mg/dL (ref 5–40)

## 2018-03-02 ENCOUNTER — Encounter: Payer: Self-pay | Admitting: *Deleted

## 2018-03-02 ENCOUNTER — Ambulatory Visit (INDEPENDENT_AMBULATORY_CARE_PROVIDER_SITE_OTHER): Payer: Medicare Other | Admitting: *Deleted

## 2018-03-02 VITALS — BP 141/62 | HR 75 | Ht 63.0 in | Wt 172.0 lb

## 2018-03-02 DIAGNOSIS — Z0000 Encounter for general adult medical examination without abnormal findings: Secondary | ICD-10-CM | POA: Diagnosis not present

## 2018-03-02 DIAGNOSIS — Z780 Asymptomatic menopausal state: Secondary | ICD-10-CM

## 2018-03-02 DIAGNOSIS — N909 Noninflammatory disorder of vulva and perineum, unspecified: Secondary | ICD-10-CM | POA: Insufficient documentation

## 2018-03-02 NOTE — Patient Instructions (Addendum)
  Ms. Hiser , Thank you for taking time to come for your Medicare Wellness Visit. I appreciate your ongoing commitment to your health goals. Please review the following plan we discussed and let me know if I can assist you in the future.   These are the goals we discussed: Goals    . Weight (lb) < 180 lb (81.6 kg)     Aim for 150 minutes of moderate activity a week   This is a list of the screening recommended for you and due dates:  Health Maintenance  Topic Date Due  . DEXA scan (bone density measurement)  03/19/2017  . Eye exam for diabetics  09/10/2017  . Flu Shot  05/25/2018  . Mammogram  05/31/2018  . Hemoglobin A1C  08/26/2018  . Complete foot exam   10/13/2018  . Tetanus Vaccine  11/27/2024  . Pneumonia vaccines  Completed

## 2018-03-02 NOTE — Progress Notes (Addendum)
Subjective:   [name] is a 77 y.o. female who presents for a Medicare Annual Wellness Visit. [name] lives at home alone since her husband died in 01/26/09. Has one adult daughter and 2 sons, 2 granddaughters. Her youngest son lives in [place] and they share an outside dog. Her daughter lives in [place] and her older son lives in [place].  She does have some difficulty with reading comprehension.   Review of Systems    Patient reports that her health is unchanged compared to last year.  Cardiac Risk Factors include: advanced age (>79men, >53 women);diabetes mellitus;dyslipidemia;family history of premature cardiovascular disease;sedentary lifestyle;obesity (BMI >30kg/m2);hypertension   Other systems negative unless otherwise noted in other area.       Current Medications (verified) Outpatient Encounter Medications as of 03/02/2018  Medication Sig  . acetaminophen (TYLENOL) 325 MG tablet Take 325 mg by mouth as needed.  Marland Kitchen aspirin 81 MG tablet Take 81 mg by mouth every other day.   Marland Kitchen atorvastatin (LIPITOR) 20 MG tablet Take 0.5 tablets (10 mg total) by mouth at bedtime.  . Calcium Carb-Cholecalciferol (CALCIUM 600+D3) 600-200 MG-UNIT TABS Take 1 tablet by mouth 2 (two) times daily.    . cephALEXin (KEFLEX) 500 MG capsule Take 1 capsule by mouth daily.  Marland Kitchen desonide (DESOWEN) 0.05 % ointment Apply 1 application topically 2 (two) times daily.   . diclofenac sodium (VOLTAREN) 1 % GEL Apply 4 g topically 4 (four) times daily.  . Dulaglutide (TRULICITY) 1.5 OQ/9.4TM SOPN Inject 1.5 mg into the skin once a week.  . escitalopram (LEXAPRO) 20 MG tablet Take 1 tablet (20 mg total) by mouth daily.  Marland Kitchen esomeprazole (NEXIUM) 20 MG capsule Take 1 capsule (20 mg total) by mouth daily at 12 noon.  Marland Kitchen estradiol (ESTRACE) 0.1 MG/GM vaginal cream Place 2 g vaginally every Monday, Wednesday, and 27-Jan-2023.   . fluticasone (FLONASE) 50 MCG/ACT nasal spray Place 2 sprays into both nostrils daily.  Marland Kitchen  glucose blood (FREESTYLE LITE) test strip Use qid to check blood sugar. Dx E11.9  . insulin aspart (NOVOLOG FLEXPEN) 100 UNIT/ML FlexPen BG less than 80 - no insulin; 80 to 120 - 3 units; 121 to 150 - 4 units; 151 to 200 - 5 units; 201 to 250 - 6 units; 250 or above - 7 units  . Insulin Glargine (LANTUS SOLOSTAR) 100 UNIT/ML Solostar Pen Inject 32 Units into the skin every morning.  . Insulin Pen Needle 31G X 5 MM MISC 1 Device by Does not apply route 4 (four) times daily.  . mirabegron ER (MYRBETRIQ) 50 MG TB24 tablet Take 50 mg by mouth daily.  . Multiple Vitamin (MULTIVITAMIN) tablet Take 1 tablet by mouth daily.   Marland Kitchen nystatin (NYSTATIN) powder APPLY UNDER BILATERAL BREASTS AND GROIN TWICE A DAY  . nystatin-triamcinolone ointment (MYCOLOG) APPLY TOPICALLY 2 (TWO) TIMES DAILY.  Marland Kitchen ondansetron (ZOFRAN) 4 MG tablet Take 1 tablet (4 mg total) by mouth every 8 (eight) hours as needed for nausea or vomiting.  Vladimir Faster Glycol-Propyl Glycol 0.4-0.3 % SOLN Apply 1 drop to eye daily as needed (for dry eye relief).   . polyethylene glycol (MIRALAX / GLYCOLAX) packet Take 17 g by mouth daily as needed.   . Probiotic Product (PROBIOTIC ADVANCED PO) Take 1 capsule by mouth daily.   . traZODone (DESYREL) 150 MG tablet Take 0.5 tablets (75 mg total) by mouth at bedtime.  . [DISCONTINUED] lisinopril (PRINIVIL,ZESTRIL) 5 MG tablet Take 1 tablet (5 mg  total) by mouth daily. (Patient not taking: Reported on 03/02/2018)   No facility-administered encounter medications on file as of 03/02/2018.     Allergies (verified) Latex; Ambien [zolpidem]; Augmentin [amoxicillin-pot clavulanate]; Belsomra [suvorexant]; Hydrocodone-acetaminophen; Hyoscyamine sulfate; Melatonin; Mobic [meloxicam]; Morphine; Oxycodone; Tramadol; Celebrex [celecoxib]; and Sulfa antibiotics   History: Past Medical History:  Diagnosis Date  . Acute cystitis   . Anxiety   . Depression   . Diabetes mellitus type II   . Diabetic retinopathy   .  DM type 2 causing CKD stage 3 ([place])   . Fibromyalgia   . GERD (gastroesophageal reflux disease)   . Hyperlipidemia   . Hypertension   . IBS (irritable bowel syndrome)   . IBS (irritable bowel syndrome)   . Insomnia   . Lichen planus    vulvar  . Pancreatitis, gallstone   . Postmenopausal   . Radial scar of breast 06/04/2016  . Urge incontinence of urine    Past Surgical History:  Procedure Laterality Date  . ANKLE SURGERY  02/2004  . BREAST LUMPECTOMY WITH RADIOACTIVE SEED LOCALIZATION Left 01/22/2016   Procedure: LEFT BREAST LUMPECTOMY WITH RADIOACTIVE SEED LOCALIZATION;  Surgeon: Autumn Messing III, MD;  Location: [facility];  Service: General;  Laterality: Left;  . CATARACT EXTRACTION W/ INTRAOCULAR LENS IMPLANT  09/2003, 12/2003  . CHOLECYSTECTOMY  2004  . EYE SURGERY     laser treatments  . JOINT REPLACEMENT    . TOTAL KNEE ARTHROPLASTY     Left  . TRIGGER FINGER RELEASE Left 01/30/2002  . TUBAL LIGATION  1970   Family History  Problem Relation Age of Onset  . Diabetes Mother   . Hypertension Mother   . Kidney disease Mother   . Heart disease Mother   . Peripheral vascular disease Mother        amputation  . Cancer Father        prostate  . Diabetes Father   . Heart disease Father        CABG  . Hypertension Father   . Heart attack Father   . Depression Sister   . Hypertension Sister   . Neuropathy Sister    Social History   Socioeconomic History  . Marital status: Widowed    Spouse name: Not on file  . Number of children: 3  . Years of education: 31  . Highest education level: Some college, no degree  Occupational History  . Occupation: retired    Fish farm manager: RETIRED    Comment: bookeeping  Social Needs  . Financial resource strain: Not hard at all  . Food insecurity:    Worry: Never true    Inability: Never true  . Transportation needs:    Medical: No    Non-medical: No  Tobacco Use  . Smoking status: Never Smoker  . Smokeless  tobacco: Never Used  Substance and Sexual Activity  . Alcohol use: No  . Drug use: No  . Sexual activity: Not Currently  Lifestyle  . Physical activity:    Days per week: 3 days    Minutes per session: 30 min  . Stress: Only a little  Relationships  . Social connections:    Talks on phone: Not on file    Gets together: Not on file    Attends religious service: Not on file    Active member of club or organization: Not on file    Attends meetings of clubs or organizations: Not on file    Relationship status:  Not on file  Other Topics Concern  . Not on file  Social History Narrative   Widowed in 2010    Tobacco Use No.  Clinical Intake:     Pain : 0-10 Pain Score: 3  Pain Type: Chronic pain Pain Location: (S) Generalized(multiple joint pain) Pain Orientation: Left, Right Pain Onset: More than a month ago Pain Frequency: Constant Pain Relieving Factors: rest Effect of Pain on Daily Activities: minimal  Pain Relieving Factors: rest  Nutritional Status: BMI 25 -29 Overweight Diabetes: Yes CBG done?: No Did pt. bring in CBG monitor from home?: No  How often do you need to have someone help you when you read instructions, pamphlets, or other written materials from your doctor or pharmacy?: 2 - Rarely(sometimes has trouble with reading comprehension. Has to reread things sometimes) What is the last grade level you completed in school?: one year of college     Information entered by :: Chong Sicilian, RN   Activities of Daily Living In your present state of health, do you have any difficulty performing the following activities: 03/02/2018  Hearing? Y  Comment children have mentioned that she has trouble hearing and she turns the TV up some. Has had a hearing exam and thougt about hearing aids but hasn't bought any  Vision? N  Comment has routine eye exams yearly with optometrist and sees retina specialist every 6 months  Difficulty concentrating or making decisions? N    Walking or climbing stairs? N  Dressing or bathing? N  Doing errands, shopping? N  Preparing Food and eating ? N  Using the Toilet? N  In the past six months, have you accidently leaked urine? N  Do you have problems with loss of bowel control? N  Managing your Medications? N  Comment uses a pill box  Managing your Finances? N  Housekeeping or managing your Housekeeping? N  Some recent data might be hidden     Immunizations and Health Maintenance Immunization History  Administered Date(s) Administered  . Influenza, High Dose Seasonal PF 07/19/2016, 10/13/2017  . Influenza,inj,Quad PF,6+ Mos 07/30/2013, 07/24/2014, 08/04/2015  . Pneumococcal Conjugate-13 11/27/2014  . Pneumococcal Polysaccharide-23 10/22/2009  . Td 10/25/2004  . Tdap 11/27/2014  . Zoster 04/06/2012   Health Maintenance Due  Topic Date Due  . DEXA SCAN  03/19/2017  . OPHTHALMOLOGY EXAM  09/10/2017    Diet 3 meals a day  Exercise Current Exercise Habits: The patient does not participate in regular exercise at present   Depression Screen PHQ 2/9 Scores 03/02/2018 02/23/2018 10/13/2017 07/14/2017 04/08/2017 01/19/2017 01/04/2017  PHQ - 2 Score 0 0 0 1 0 2 0  PHQ- 9 Score - - - - - 7 -     Fall Risk Fall Risk  03/02/2018 02/23/2018 10/13/2017 07/14/2017 04/08/2017  Falls in the past year? No No No No No  Risk for fall due to : - - - - -  Risk for fall due to: Comment - - - - -    Safety Is the patient's home free of loose throw rugs in walkways, pet beds, electrical cords, etc?   yes      Grab bars in the bathroom? no      Walkin shower? no      Shower Seat? no      Handrails on the stairs?   no      Adequate lighting?   yes  Patient Care Team: Timmothy Euler, MD as PCP - General (Family Medicine) Jacelyn Grip,  Mary Sella, MD (Surgery) [place], Laurice Record, MD (Inactive) as Consulting Physician (Orthopedic Surgery) Teena Irani, MD (Inactive) as Consulting Physician (Gastroenterology) Paula Compton, MD as  Consulting Physician (Obstetrics and Gynecology) Rolm Bookbinder, MD as Consulting Physician (Dermatology) Bjorn Loser, MD as Consulting Physician (Urology) Minus Breeding, MD as Consulting Physician (Cardiology) Steffanie Rainwater, DPM as Consulting Physician (Podiatry) Raeanne Gathers, AUD as Consulting Physician (Audiology) Susa Day, MD as Consulting Physician (Orthopedic Surgery)   No hospitalizations, ER visits, or surgeries this past year.  Objective:    Today's Vitals   03/02/18 1526  BP: (!) 141/62  Pulse: 75  Weight: 172 lb (78 kg)  Height: 5\' 3"  (1.6 m)  PainSc: 3    Body mass index is 30.47 kg/m.  Advanced Directives 03/02/2018 07/12/2017 09/24/2016 09/21/2016 07/07/2016 01/16/2016 01/15/2016  Does Patient Have a Medical Advance Directive? Yes Yes Yes Yes Yes Yes Yes  Type of Advance Directive Living will;Healthcare Power of [place];Living will - - - Living will Living will  Does patient want to make changes to medical advance directive? No - Patient declined No - Patient declined No - Patient declined Yes (Inpatient - patient defers changing a medical advance directive at this time) - No - Patient declined -  Copy of [place] in Chart? No - copy requested No - copy requested - - - No - copy requested -  Would patient like information on creating a medical advance directive? No - Patient declined - - - - - -    Hearing/Vision  normal or No deficits noted during visit.  Cognitive Function: MMSE - Mini Mental State Exam 03/02/2018 01/19/2017 01/16/2016 12/30/2014  Orientation to time 5 5 5 5   Orientation to Place 5 5 5 5   Registration 3 3 3 3   Attention/ Calculation 5 5 5 5   Recall 1 2 3 3   Language- name 2 objects 2 2 2 2   Language- repeat 1 1 1 1   Language- follow 3 step command 3 3 3 3   Language- read & follow direction 1 1 1 1   Write a sentence 1 1 1 1   Copy design 1 1 1 1   Total score 28 29 30 30        Normal  Cognitive Function Screening: Yes      Assessment:   This is a routine wellness examination for [place].    Plan:    Goals    . Weight (lb) < 180 lb (81.6 kg)       Keep f/u with Timmothy Euler, MD and any other specialty appointments you may have Continue current medications Move carefully to avoid falls. Use assistive devices like a can or walker if needed. Aim for at least 150 minutes of moderate activity a week. This can be done with chair exercises if necessary. Read or work on puzzles daily Stay connected with friends and family  Review and return a signed, witnessed, and notarized copy of Advance Directives if given.  Health Maintenance: Tdap Vaccine recommended: no Zostavax (Shingles vaccine) recommended:no Prevnar or Pneumovax (pneumonia vaccines) recommended:no  Cancer Screenings: Lung: Low Dose CT Chest recommended if Age 15-80 years, 30 pack-year currently smoking OR have quit w/in 15years. Patient does not qualify. Colon cancer screening recommended: not applicable Mammogram recommended:not applicable Pap Smear recommended: not applicable  Additional Screenings Hepatitis C Screening recommended: not applicable Dexa Scan recommended: yes Diabetic Eye Exam recommended: yes  Orders Placed This Encounter  Procedures  . DG [facility] DEXA  Scheduling Instructions:     Will have done at next visit in Aug 2019    Order Specific Question:   Reason for Exam (SYMPTOM  OR DIAGNOSIS REQUIRED)    Answer:   post menopausal    I have personally reviewed and noted the following in the patient's chart:   . Medical and social history . Use of alcohol, tobacco or illicit drugs  . Current medications and supplements . Functional ability and status . Nutritional status . Physical activity . Advanced directives . List of other physicians . Hospitalizations, surgeries, and ER visits in previous 12 months . Vitals . Screenings to include cognitive, depression, and  falls . Referrals and appointments  In addition, I have reviewed and discussed with patient certain preventive protocols, quality metrics, and best practice recommendations. A written personalized care plan for preventive services as well as general preventive health recommendations were provided to patient.     Chong Sicilian, RN   03/02/18     I have reviewed and agree with the above AWV documentation.   Laroy Apple, MD [facility] 03/24/2018, 11:41 AM

## 2018-03-23 ENCOUNTER — Telehealth: Payer: Self-pay | Admitting: Family Medicine

## 2018-03-23 MED ORDER — BENAZEPRIL HCL 5 MG PO TABS: 5.0000 mg | ORAL_TABLET | Freq: Every day | ORAL | 0 refills | Status: DC

## 2018-03-23 NOTE — Telephone Encounter (Signed)
Pt states that she think the lisinopril (PRINIVIL,ZESTRIL) 5 MG tablet is causing her to have the following side effects:  Diarrhea, stomach pains, unusually tiredness, and not able to sleep.  She isnt sure if this is normal side effects

## 2018-03-23 NOTE — Telephone Encounter (Signed)
Aware of new script. 

## 2018-03-23 NOTE — Telephone Encounter (Signed)
These are not typical side effects of lisinopril, will try benazepril 5 mg.  This is been sent to her mail order pharmacy.  For now discontinue lisinopril.  Laroy Apple, MD [facility] 03/23/2018, 4:55 PM

## 2018-04-04 DIAGNOSIS — M79676 Pain in unspecified toe(s): Secondary | ICD-10-CM | POA: Diagnosis not present

## 2018-04-04 DIAGNOSIS — E1142 Type 2 diabetes mellitus with diabetic polyneuropathy: Secondary | ICD-10-CM | POA: Diagnosis not present

## 2018-04-04 DIAGNOSIS — B351 Tinea unguium: Secondary | ICD-10-CM | POA: Diagnosis not present

## 2018-04-04 DIAGNOSIS — L84 Corns and callosities: Secondary | ICD-10-CM | POA: Diagnosis not present

## 2018-04-17 ENCOUNTER — Encounter (INDEPENDENT_AMBULATORY_CARE_PROVIDER_SITE_OTHER): Payer: Medicare Other | Admitting: Ophthalmology

## 2018-04-25 LAB — HM DIABETES EYE EXAM

## 2018-04-26 ENCOUNTER — Encounter (INDEPENDENT_AMBULATORY_CARE_PROVIDER_SITE_OTHER): Payer: Medicare Other | Admitting: Ophthalmology

## 2018-04-26 DIAGNOSIS — E113393 Type 2 diabetes mellitus with moderate nonproliferative diabetic retinopathy without macular edema, bilateral: Secondary | ICD-10-CM | POA: Diagnosis not present

## 2018-04-26 DIAGNOSIS — H43813 Vitreous degeneration, bilateral: Secondary | ICD-10-CM

## 2018-04-26 DIAGNOSIS — H35033 Hypertensive retinopathy, bilateral: Secondary | ICD-10-CM

## 2018-04-26 DIAGNOSIS — I10 Essential (primary) hypertension: Secondary | ICD-10-CM

## 2018-04-26 DIAGNOSIS — E11319 Type 2 diabetes mellitus with unspecified diabetic retinopathy without macular edema: Secondary | ICD-10-CM

## 2018-04-26 LAB — HM DIABETES EYE EXAM

## 2018-05-02 ENCOUNTER — Telehealth: Payer: Self-pay | Admitting: Family Medicine

## 2018-05-03 ENCOUNTER — Ambulatory Visit (INDEPENDENT_AMBULATORY_CARE_PROVIDER_SITE_OTHER): Payer: Medicare Other | Admitting: Family Medicine

## 2018-05-03 ENCOUNTER — Encounter: Payer: Self-pay | Admitting: Family Medicine

## 2018-05-03 VITALS — BP 101/54 | HR 80 | Temp 98.7°F | Ht 63.0 in | Wt 166.8 lb

## 2018-05-03 DIAGNOSIS — I10 Essential (primary) hypertension: Secondary | ICD-10-CM | POA: Diagnosis not present

## 2018-05-03 MED ORDER — TRAZODONE HCL 150 MG PO TABS: 75.0000 mg | ORAL_TABLET | Freq: Every day | ORAL | 3 refills | Status: DC

## 2018-05-03 NOTE — Patient Instructions (Signed)
Great to see you!  Stop benzepril, come back as scheduled

## 2018-05-03 NOTE — Telephone Encounter (Signed)
Left message - rx requested has been sent to the pharmacy. 

## 2018-05-03 NOTE — Telephone Encounter (Signed)
Trazodone Rx sent  Laroy Apple, MD [facility] 05/03/2018, 11:42 AM

## 2018-05-03 NOTE — Progress Notes (Signed)
   HPI  Patient presents today for hypertension.  Patient states over the last 2 or 3 weeks she is noticed that she has more fluctuating sugars recently.  She states that she feels weak and tired when her blood pressure is low. She is tolerating benazepril well except for the low blood pressures.  BP Home log reviewed with blood pressures fluctuating from 88 systolic to 007 systolic over 12R and 97J typically diastolic. Most blood pressures are low 100s and 90s.   PMH: Smoking status noted ROS: Per HPI  Objective: BP (!) 101/54   Pulse 80   Temp 98.7 F (37.1 C) (Oral)   Ht 5\' 3"  (1.6 m)   Wt 166 lb 12.8 oz (75.7 kg)   BMI 29.55 kg/m  Gen: NAD, alert, cooperative with exam HEENT: NCAT,  CV: RRR, good S1/S2, no murmur Resp: CTABL, no wheezes, non-labored Ext: No edema, warm Neuro: Alert and oriented, No gross deficits  Assessment and plan:  #Hypertension Patient has lost 20 pounds over the last 10 months on review of her chart I do not believe she requires her benazepril any longer, discontinue. Previous urine microalbumin was normal. Follow-up in 1 month as scheduled   Laroy Apple, MD [facility] 05/03/2018, 2:40 PM

## 2018-05-05 DIAGNOSIS — Z794 Long term (current) use of insulin: Secondary | ICD-10-CM | POA: Diagnosis not present

## 2018-05-05 DIAGNOSIS — E1140 Type 2 diabetes mellitus with diabetic neuropathy, unspecified: Secondary | ICD-10-CM | POA: Diagnosis not present

## 2018-05-26 ENCOUNTER — Encounter: Payer: Self-pay | Admitting: Family Medicine

## 2018-05-26 ENCOUNTER — Ambulatory Visit (INDEPENDENT_AMBULATORY_CARE_PROVIDER_SITE_OTHER): Payer: Medicare Other | Admitting: Family Medicine

## 2018-05-26 ENCOUNTER — Ambulatory Visit (INDEPENDENT_AMBULATORY_CARE_PROVIDER_SITE_OTHER): Payer: Medicare Other

## 2018-05-26 VITALS — BP 121/66 | HR 77 | Temp 98.6°F | Ht 63.0 in | Wt 165.0 lb

## 2018-05-26 DIAGNOSIS — E11319 Type 2 diabetes mellitus with unspecified diabetic retinopathy without macular edema: Secondary | ICD-10-CM | POA: Diagnosis not present

## 2018-05-26 DIAGNOSIS — E119 Type 2 diabetes mellitus without complications: Secondary | ICD-10-CM | POA: Diagnosis not present

## 2018-05-26 DIAGNOSIS — I10 Essential (primary) hypertension: Secondary | ICD-10-CM | POA: Diagnosis not present

## 2018-05-26 DIAGNOSIS — N183 Chronic kidney disease, stage 3 unspecified: Secondary | ICD-10-CM

## 2018-05-26 DIAGNOSIS — Z794 Long term (current) use of insulin: Secondary | ICD-10-CM | POA: Diagnosis not present

## 2018-05-26 DIAGNOSIS — Z780 Asymptomatic menopausal state: Secondary | ICD-10-CM

## 2018-05-26 NOTE — Progress Notes (Signed)
Subjective: CC: HTN, DM PCP: [name] Norlander, DO VZD:GLOVF [name] is a 77 y.o. female presenting to clinic today for:  1. Hypertension/CKD Patient was seen by previous PCP 1 month ago.  She was noted to have fluctuating blood pressures.  Her ACE inhibitor was discontinued.  She follows up for 1 month repeat blood pressure check.  Denies headache, dizziness, visual changes, nausea, vomiting, chest pain, LE swelling, abdominal pain or shortness of breath.   2. Type 2 Diabetes:  Patient reports: Glucometer: using continuous glucose monitoring.  No seeing endocrinology w/ next visit 09/2018. Per her report A1c in June 6.2., High at home: 292; Low at home: 70 (feels symptomatic <100, describes as confused, jittery), Taking medication(s): Lantus 30 qhs, Novolog SSI; rarely has to use, Trucility, Side effects: none  Last eye exam: Sees Dr [name] in [facility], retinal specialist q6 months. Last foot exam: UTD Last A1c: 6.0 on 02/23/18 Nephropathy screen indicated?: Yes Last flu, zoster and/or pneumovax: UTD  ROS: denies fever, chills, dizziness, LOC, polyuria, polydipsia, unintended weight loss/gain, foot ulcerations, numbness or tingling in extremities or chest pain.  ROS: Per HPI  Allergies  Allergen Reactions  . Latex Anaphylaxis and Rash  . Ambien [Zolpidem] Other (See Comments)    Hallucinations  . Augmentin [Amoxicillin-Pot Clavulanate] Nausea And Vomiting  . Belsomra [Suvorexant] Other (See Comments)    Hallucinations  . Hydrocodone-Acetaminophen Other (See Comments)    hallucinations  . Hyoscyamine Sulfate Other (See Comments)  . Melatonin Other (See Comments)    Adverse reaction-"keeps me awake"  . Mobic [Meloxicam]   . Morphine Nausea And Vomiting    REACTION: hallucinations,GI upset  . Oxycodone Other (See Comments)    Hallucinations  . Tramadol     Unknown reaction  . Celebrex [Celecoxib] Itching and Rash  . Sulfa Antibiotics Rash   Past Medical History:  Diagnosis  Date  . Acute cystitis   . Anxiety   . Depression   . Diabetes mellitus type II   . Diabetic retinopathy   . DM type 2 causing CKD stage 3 ([place])   . Fibromyalgia   . GERD (gastroesophageal reflux disease)   . Hyperlipidemia   . Hypertension   . IBS (irritable bowel syndrome)   . IBS (irritable bowel syndrome)   . Insomnia   . Lichen planus    vulvar  . Pancreatitis, gallstone   . Postmenopausal   . Radial scar of breast 06/04/2016  . Urge incontinence of urine     Current Outpatient Medications:  .  acetaminophen (TYLENOL) 325 MG tablet, Take 325 mg by mouth as needed., Disp: , Rfl:  .  aspirin 81 MG tablet, Take 81 mg by mouth every other day. , Disp: , Rfl:  .  atorvastatin (LIPITOR) 20 MG tablet, Take 0.5 tablets (10 mg total) by mouth at bedtime., Disp: 90 tablet, Rfl: 3 .  Calcium Carb-Cholecalciferol (CALCIUM 600+D3) 600-200 MG-UNIT TABS, Take 1 tablet by mouth 2 (two) times daily.  , Disp: , Rfl:  .  cephALEXin (KEFLEX) 500 MG capsule, Take 1 capsule by mouth daily., Disp: , Rfl:  .  desonide (DESOWEN) 0.05 % ointment, Apply 1 application topically 2 (two) times daily. , Disp: , Rfl:  .  diclofenac sodium (VOLTAREN) 1 % GEL, Apply 4 g topically 4 (four) times daily., Disp: 100 g, Rfl: 3 .  Dulaglutide (TRULICITY) 1.5 IE/3.3IR SOPN, Inject 1.5 mg into the skin once a week., Disp: 12 pen, Rfl: 3 .  escitalopram (  LEXAPRO) 20 MG tablet, Take 1 tablet (20 mg total) by mouth daily., Disp: 90 tablet, Rfl: 2 .  esomeprazole (NEXIUM) 20 MG capsule, Take 1 capsule (20 mg total) by mouth daily at 12 noon., Disp: 90 capsule, Rfl: 3 .  estradiol (ESTRACE) 0.1 MG/GM vaginal cream, Place 2 g vaginally every Monday, Wednesday, and Friday. , Disp: , Rfl:  .  fluticasone (FLONASE) 50 MCG/ACT nasal spray, Place 2 sprays into both nostrils daily., Disp: 16 g, Rfl: 6 .  glucose blood (FREESTYLE LITE) test strip, Use qid to check blood sugar. Dx E11.9, Disp: 200 each, Rfl: 11 .  insulin aspart  (NOVOLOG FLEXPEN) 100 UNIT/ML FlexPen, BG less than 80 - no insulin; 80 to 120 - 3 units; 121 to 150 - 4 units; 151 to 200 - 5 units; 201 to 250 - 6 units; 250 or above - 7 units, Disp: 30 mL, Rfl: 3 .  Insulin Glargine (LANTUS SOLOSTAR) 100 UNIT/ML Solostar Pen, Inject 32 Units into the skin every morning., Disp: 45 mL, Rfl: 3 .  Insulin Pen Needle 31G X 5 MM MISC, 1 Device by Does not apply route 4 (four) times daily., Disp: 300 each, Rfl: 4 .  mirabegron ER (MYRBETRIQ) 50 MG TB24 tablet, Take 50 mg by mouth daily., Disp: , Rfl:  .  Multiple Vitamin (MULTIVITAMIN) tablet, Take 1 tablet by mouth daily. , Disp: , Rfl:  .  nystatin (NYSTATIN) powder, APPLY UNDER BILATERAL BREASTS AND GROIN TWICE A DAY, Disp: 60 g, Rfl: 3 .  nystatin-triamcinolone ointment (MYCOLOG), APPLY TOPICALLY 2 (TWO) TIMES DAILY., Disp: 60 g, Rfl: 2 .  ondansetron (ZOFRAN) 4 MG tablet, Take 1 tablet (4 mg total) by mouth every 8 (eight) hours as needed for nausea or vomiting., Disp: 20 tablet, Rfl: 0 .  Polyethyl Glycol-Propyl Glycol 0.4-0.3 % SOLN, Apply 1 drop to eye daily as needed (for dry eye relief). , Disp: , Rfl:  .  polyethylene glycol (MIRALAX / GLYCOLAX) packet, Take 17 g by mouth daily as needed. , Disp: , Rfl:  .  Probiotic Product (PROBIOTIC ADVANCED PO), Take 1 capsule by mouth daily. , Disp: , Rfl:  .  traZODone (DESYREL) 150 MG tablet, Take 0.5 tablets (75 mg total) by mouth at bedtime., Disp: 45 tablet, Rfl: 3 Social History   Socioeconomic History  . Marital status: Widowed    Spouse name: Not on file  . Number of children: 3  . Years of education: 67  . Highest education level: Some college, no degree  Occupational History  . Occupation: retired    Fish farm manager: RETIRED    Comment: bookeeping  Social Needs  . Financial resource strain: Not hard at all  . Food insecurity:    Worry: Never true    Inability: Never true  . Transportation needs:    Medical: No    Non-medical: No  Tobacco Use  .  Smoking status: Never Smoker  . Smokeless tobacco: Never Used  Substance and Sexual Activity  . Alcohol use: No  . Drug use: No  . Sexual activity: Not Currently  Lifestyle  . Physical activity:    Days per week: 3 days    Minutes per session: 30 min  . Stress: Only a little  Relationships  . Social connections:    Talks on phone: Not on file    Gets together: Not on file    Attends religious service: Not on file    Active member of club or organization: Not on  file    Attends meetings of clubs or organizations: Not on file    Relationship status: Not on file  . Intimate partner violence:    Fear of current or ex partner: Not on file    Emotionally abused: Not on file    Physically abused: Not on file    Forced sexual activity: Not on file  Other Topics Concern  . Not on file  Social History Narrative   Widowed in 2010   Family History  Problem Relation Age of Onset  . Diabetes Mother   . Hypertension Mother   . Kidney disease Mother   . Heart disease Mother   . Peripheral vascular disease Mother        amputation  . Cancer Father        prostate  . Diabetes Father   . Heart disease Father        CABG  . Hypertension Father   . Heart attack Father   . Depression Sister   . Hypertension Sister   . Neuropathy Sister     Objective: Office vital signs reviewed. BP 121/66   Pulse 77   Temp 98.6 F (37 [name]) (Oral)   Ht 5\' 3"  (1.6 m)   Wt 165 lb (74.8 kg)   BMI 29.23 kg/m   Physical Examination:  General: Awake, alert, well nourished, No acute distress HEENT: Normal, MMM, sclera white; no carotid bruits Cardio: regular rate and rhythm, S1S2 heard, no murmurs appreciated Pulm: clear to auscultation bilaterally, no wheezes, rhonchi or rales; normal work of breathing on room air Extremities: warm, well perfused, No edema, cyanosis or clubbing; +2 pulses bilaterally Skin: ecchymosis noted along RUE and left hand.  Assessment/ Plan: 77 y.o. female   1. Type 2  diabetes mellitus with insulin therapy ([place]) History of CKD 3.  We discussed what this diagnosis means during today's visit.  She was taken off her ACE inhibitor at last visit for fluctuating blood pressures.  We will check urine microalbumin/creatinine ratio.  We discussed that likely she would benefit from at least 2.5 mg daily for renal protection.  Patient was good understanding.  Will contact patient with results once available A1c not repeated because this was just obtained at endocrinologist in June.  This was at therapeutic goal.  She has an eye doctor in [place] and we will obtain records from previous visits.  - Microalbumin/Creatinine Ratio, Urine  2. Essential hypertension Diet controlled.  We discussed consideration for low-dose lisinopril for renal protection given history of CKD.  Patient will be amenable to this.  We will plan to restart this.  3. CKD (chronic kidney disease) stage 3, GFR 30-59 ml/min ([facility]) Discussed would likely benefit from Lisinopril 2.5mg  daily  4. Diabetic retinopathy without macular edema associated with type 2 diabetes mellitus, unspecified laterality, unspecified retinopathy severity ([place]) Will obtain records.  ROI completed today.   Orders Placed This Encounter  Procedures  . Microalbumin/Creatinine Ratio, Urine     [name] [name], [place] [phone]

## 2018-05-26 NOTE — Patient Instructions (Addendum)
We will check your urine for evidence of protein.  You do have a history of chronic kidney disease which may warrant continuation of lisinopril at a very low dose.  I will contact you with results of the urine study once available and let you know whether or not we need to start lisinopril back at 2.5 mg.  I am also requesting the records from Dr. Zigmund Daniel' office.  Follow-up with me in 6 months or sooner if needed.  Do not forget to come in and get your flu shot.  Bone Densitometry Bone densitometry is an imaging test that uses a special X-ray to measure the amount of calcium and other minerals in your bones (bone density). This test is also known as a bone mineral density test or dual-energy X-ray absorptiometry (DXA). The test can measure bone density at your hip and your spine. It is similar to having a regular X-ray. You may have this test to:  Diagnose a condition that causes weak or thin bones (osteoporosis).  Predict your risk of a broken bone (fracture).  Determine how well osteoporosis treatment is working.  Tell a health care provider about:  Any allergies you have.  All medicines you are taking, including vitamins, herbs, eye drops, creams, and over-the-counter medicines.  Any problems you or family members have had with anesthetic medicines.  Any blood disorders you have.  Any surgeries you have had.  Any medical conditions you have.  Possibility of pregnancy.  Any other medical test you had within the previous 14 days that used contrast material. What are the risks? Generally, this is a safe procedure. However, problems can occur and may include the following:  This test exposes you to a very small amount of radiation.  The risks of radiation exposure may be greater to unborn children.  What happens before the procedure?  Do not take any calcium supplements for 24 hours before having the test. You can otherwise eat and drink what you usually do.  Take off all  metal jewelry, eyeglasses, dental appliances, and any other metal objects. What happens during the procedure?  You may lie on an exam table. There will be an X-ray generator below you and an imaging device above you.  Other devices, such as boxes or braces, may be used to position your body properly for the scan.  You will need to lie still while the machine slowly scans your body.  The images will show up on a computer monitor. What happens after the procedure? You may need more testing at a later time. This information is not intended to replace advice given to you by your health care provider. Make sure you discuss any questions you have with your health care provider. Document Released: 11/02/2004 Document Revised: 03/18/2016 Document Reviewed: 03/21/2014 Elsevier Interactive Patient Education  2018 [employer].

## 2018-05-27 LAB — MICROALBUMIN / CREATININE URINE RATIO
Creatinine, Urine: 103.6 mg/dL
MICROALB/CREAT RATIO: 4.5 mg/g{creat} (ref 0.0–30.0)
MICROALBUM., U, RANDOM: 4.7 ug/mL

## 2018-05-29 ENCOUNTER — Other Ambulatory Visit: Payer: Self-pay | Admitting: Family Medicine

## 2018-05-29 ENCOUNTER — Other Ambulatory Visit: Payer: Medicare Other

## 2018-05-29 DIAGNOSIS — N183 Chronic kidney disease, stage 3 unspecified: Secondary | ICD-10-CM

## 2018-05-29 DIAGNOSIS — Z13820 Encounter for screening for osteoporosis: Secondary | ICD-10-CM | POA: Diagnosis not present

## 2018-05-29 DIAGNOSIS — Z780 Asymptomatic menopausal state: Secondary | ICD-10-CM | POA: Diagnosis not present

## 2018-05-29 MED ORDER — LISINOPRIL 2.5 MG PO TABS: 2.5000 mg | ORAL_TABLET | Freq: Every day | ORAL | 2 refills | Status: DC

## 2018-05-30 LAB — BASIC METABOLIC PANEL
BUN/Creatinine Ratio: 17 (ref 12–28)
BUN: 22 mg/dL (ref 8–27)
CALCIUM: 9.3 mg/dL (ref 8.7–10.3)
CO2: 27 mmol/L (ref 20–29)
Chloride: 97 mmol/L (ref 96–106)
Creatinine, Ser: 1.32 mg/dL — ABNORMAL HIGH (ref 0.57–1.00)
GFR, EST AFRICAN AMERICAN: 45 mL/min/{1.73_m2} — AB (ref >59)
GFR, EST NON AFRICAN AMERICAN: 39 mL/min/{1.73_m2} — AB (ref >59)
GLUCOSE: 282 mg/dL — AB (ref 65–99)
Potassium: 4.2 mmol/L (ref 3.5–5.2)
Sodium: 135 mmol/L (ref 134–144)

## 2018-06-07 DIAGNOSIS — Z6829 Body mass index (BMI) 29.0-29.9, adult: Secondary | ICD-10-CM | POA: Diagnosis not present

## 2018-06-07 DIAGNOSIS — Z1231 Encounter for screening mammogram for malignant neoplasm of breast: Secondary | ICD-10-CM | POA: Diagnosis not present

## 2018-06-07 DIAGNOSIS — N898 Other specified noninflammatory disorders of vagina: Secondary | ICD-10-CM | POA: Diagnosis not present

## 2018-06-07 DIAGNOSIS — Z01419 Encounter for gynecological examination (general) (routine) without abnormal findings: Secondary | ICD-10-CM | POA: Diagnosis not present

## 2018-06-20 ENCOUNTER — Telehealth: Payer: Self-pay | Admitting: Family Medicine

## 2018-06-20 NOTE — Telephone Encounter (Signed)
Pt is having some low BP readings since starting Lisinopril appt scheduled for reevaluation

## 2018-06-21 ENCOUNTER — Encounter: Payer: Self-pay | Admitting: Family Medicine

## 2018-06-21 ENCOUNTER — Ambulatory Visit (INDEPENDENT_AMBULATORY_CARE_PROVIDER_SITE_OTHER): Payer: Medicare Other | Admitting: Family Medicine

## 2018-06-21 VITALS — BP 134/70 | HR 75 | Temp 98.4°F | Ht 63.0 in | Wt 165.0 lb

## 2018-06-21 DIAGNOSIS — I959 Hypotension, unspecified: Secondary | ICD-10-CM

## 2018-06-21 DIAGNOSIS — I9589 Other hypotension: Secondary | ICD-10-CM

## 2018-06-21 NOTE — Progress Notes (Signed)
Subjective: CC: Hypotension PCP: [name], DO PJK:DTOIZ [name] is a 77 y.o. female presenting to clinic today for:  1.  Hypotension Patient reports 2-3 episodes of hypotension since we saw each other last.  She brings a blood pressure report with blood pressures of concern being measured blood pressures of systolics of 124-580D/98P.  Most of the blood pressure she brings in our noted to be in the 120s- 130s /70s.  She notes feeling fatigued, lightheaded during measured low blood pressures.  No syncope.  She attributes these blood pressures to the initiation of the lisinopril 2.5 mg, which was initiated for renal protection in the setting of CKD 3 and type 2 diabetes.   ROS: Per HPI  Allergies  Allergen Reactions  . Latex Anaphylaxis and Rash  . Ambien [Zolpidem] Other (See Comments)    Hallucinations  . Augmentin [Amoxicillin-Pot Clavulanate] Nausea And Vomiting  . Belsomra [Suvorexant] Other (See Comments)    Hallucinations  . Hydrocodone-Acetaminophen Other (See Comments)    hallucinations  . Hyoscyamine Sulfate Other (See Comments)  . Melatonin Other (See Comments)    Adverse reaction-"keeps me awake"  . Mobic [Meloxicam]   . Morphine Nausea And Vomiting    REACTION: hallucinations,GI upset  . Oxycodone Other (See Comments)    Hallucinations  . Tramadol     Unknown reaction  . Celebrex [Celecoxib] Itching and Rash  . Sulfa Antibiotics Rash   Past Medical History:  Diagnosis Date  . Acute cystitis   . Anxiety   . Depression   . Diabetes mellitus type II   . Diabetic retinopathy   . DM type 2 causing CKD stage 3 ([place])   . Fibromyalgia   . GERD (gastroesophageal reflux disease)   . Hyperlipidemia   . Hypertension   . IBS (irritable bowel syndrome)   . IBS (irritable bowel syndrome)   . Insomnia   . Lichen planus    vulvar  . Pancreatitis, gallstone   . Postmenopausal   . Radial scar of breast 06/04/2016  . Urge incontinence of urine     Current  Outpatient Medications:  .  acetaminophen (TYLENOL) 325 MG tablet, Take 325 mg by mouth as needed., Disp: , Rfl:  .  aspirin 81 MG tablet, Take 81 mg by mouth every other day. , Disp: , Rfl:  .  atorvastatin (LIPITOR) 20 MG tablet, Take 0.5 tablets (10 mg total) by mouth at bedtime., Disp: 90 tablet, Rfl: 3 .  Calcium Carb-Cholecalciferol (CALCIUM 600+D3) 600-200 MG-UNIT TABS, Take 1 tablet by mouth 2 (two) times daily.  , Disp: , Rfl:  .  cephALEXin (KEFLEX) 500 MG capsule, Take 1 capsule by mouth daily., Disp: , Rfl:  .  desonide (DESOWEN) 0.05 % ointment, Apply 1 application topically 2 (two) times daily. , Disp: , Rfl:  .  diclofenac sodium (VOLTAREN) 1 % GEL, Apply 4 g topically 4 (four) times daily., Disp: 100 g, Rfl: 3 .  Dulaglutide (TRULICITY) 1.5 JA/2.5KN SOPN, Inject 1.5 mg into the skin once a week., Disp: 12 pen, Rfl: 3 .  escitalopram (LEXAPRO) 20 MG tablet, Take 1 tablet (20 mg total) by mouth daily., Disp: 90 tablet, Rfl: 2 .  esomeprazole (NEXIUM) 20 MG capsule, Take 1 capsule (20 mg total) by mouth daily at 12 noon., Disp: 90 capsule, Rfl: 3 .  estradiol (ESTRACE) 0.1 MG/GM vaginal cream, Place 2 g vaginally every Monday, Wednesday, and Friday. , Disp: , Rfl:  .  fluticasone (FLONASE) 50 MCG/ACT nasal  spray, Place 2 sprays into both nostrils daily., Disp: 16 g, Rfl: 6 .  glucose blood (FREESTYLE LITE) test strip, Use qid to check blood sugar. Dx E11.9, Disp: 200 each, Rfl: 11 .  insulin aspart (NOVOLOG FLEXPEN) 100 UNIT/ML FlexPen, BG less than 80 - no insulin; 80 to 120 - 3 units; 121 to 150 - 4 units; 151 to 200 - 5 units; 201 to 250 - 6 units; 250 or above - 7 units, Disp: 30 mL, Rfl: 3 .  Insulin Glargine (LANTUS SOLOSTAR) 100 UNIT/ML Solostar Pen, Inject 32 Units into the skin every morning., Disp: 45 mL, Rfl: 3 .  Insulin Pen Needle 31G X 5 MM MISC, 1 Device by Does not apply route 4 (four) times daily., Disp: 300 each, Rfl: 4 .  lisinopril (ZESTRIL) 2.5 MG tablet, Take 1  tablet (2.5 mg total) by mouth daily., Disp: 90 tablet, Rfl: 2 .  mirabegron ER (MYRBETRIQ) 50 MG TB24 tablet, Take 50 mg by mouth daily., Disp: , Rfl:  .  Multiple Vitamin (MULTIVITAMIN) tablet, Take 1 tablet by mouth daily. , Disp: , Rfl:  .  nystatin (NYSTATIN) powder, APPLY UNDER BILATERAL BREASTS AND GROIN TWICE A DAY, Disp: 60 g, Rfl: 3 .  nystatin-triamcinolone ointment (MYCOLOG), APPLY TOPICALLY 2 (TWO) TIMES DAILY., Disp: 60 g, Rfl: 2 .  ondansetron (ZOFRAN) 4 MG tablet, Take 1 tablet (4 mg total) by mouth every 8 (eight) hours as needed for nausea or vomiting., Disp: 20 tablet, Rfl: 0 .  Polyethyl Glycol-Propyl Glycol 0.4-0.3 % SOLN, Apply 1 drop to eye daily as needed (for dry eye relief). , Disp: , Rfl:  .  polyethylene glycol (MIRALAX / GLYCOLAX) packet, Take 17 g by mouth daily as needed. , Disp: , Rfl:  .  Probiotic Product (PROBIOTIC ADVANCED PO), Take 1 capsule by mouth daily. , Disp: , Rfl:  .  traZODone (DESYREL) 150 MG tablet, Take 0.5 tablets (75 mg total) by mouth at bedtime., Disp: 45 tablet, Rfl: 3 Social History   Socioeconomic History  . Marital status: Widowed    Spouse name: Not on file  . Number of children: 3  . Years of education: 56  . Highest education level: Some college, no degree  Occupational History  . Occupation: retired    Fish farm manager: RETIRED    Comment: bookeeping  Social Needs  . Financial resource strain: Not hard at all  . Food insecurity:    Worry: Never true    Inability: Never true  . Transportation needs:    Medical: No    Non-medical: No  Tobacco Use  . Smoking status: Never Smoker  . Smokeless tobacco: Never Used  Substance and Sexual Activity  . Alcohol use: No  . Drug use: No  . Sexual activity: Not Currently  Lifestyle  . Physical activity:    Days per week: 3 days    Minutes per session: 30 min  . Stress: Only a little  Relationships  . Social connections:    Talks on phone: Not on file    Gets together: Not on file     Attends religious service: Not on file    Active member of club or organization: Not on file    Attends meetings of clubs or organizations: Not on file    Relationship status: Not on file  . Intimate partner violence:    Fear of current or ex partner: Not on file    Emotionally abused: Not on file    Physically  abused: Not on file    Forced sexual activity: Not on file  Other Topics Concern  . Not on file  Social History Narrative   Widowed in 2010   Family History  Problem Relation Age of Onset  . Diabetes Mother   . Hypertension Mother   . Kidney disease Mother   . Heart disease Mother   . Peripheral vascular disease Mother        amputation  . Cancer Father        prostate  . Diabetes Father   . Heart disease Father        CABG  . Hypertension Father   . Heart attack Father   . Depression Sister   . Hypertension Sister   . Neuropathy Sister     Objective: Office vital signs reviewed. BP 134/70   Pulse 75   Temp 98.4 F (36.9 C) (Oral)   Ht 5\' 3"  (1.6 m)   Wt 165 lb (74.8 kg)   BMI 29.23 kg/m   Physical Examination:  General: Awake, alert, well nourished, No acute distress Cardio: regular rate and rhythm, S1S2 heard, no murmurs appreciated Pulm: clear to auscultation bilaterally, no wheezes, rhonchi or rales; normal work of breathing on room air Extremities: warm, well perfused, No edema, cyanosis or clubbing; +2 pulses bilaterally  Assessment/ Plan: 77 y.o. female   1. Symptomatic hypotension We discussed discontinuing the lisinopril 2.5 mg.  Her urine microalbumin actually did not demonstrate significant protein.  She does have CKD 3 and an ACE or an ARB would be beneficial in protecting from future damage.  However, given the risk of hypertension possibly precipitating a fall I feel that this outweighs the benefits at this point.  We discussed monitoring her blood pressure closely and that if it is to start rising we may need to consider revisiting an ACE  or an ARB.  If she has persistent symptoms outside of discontinuing the medication, we should consider reevaluating her in reinitiating this medicine.  She was good understanding and will follow up with me as recommended. - Basic Metabolic Panel   Orders Placed This Encounter  Procedures  . Basic Metabolic Panel   Medications Discontinued During This Encounter  Medication Reason  . lisinopril (ZESTRIL) 2.5 MG tablet       [name], DO [place] [phone]

## 2018-06-21 NOTE — Patient Instructions (Signed)
Stop the lisinopril.  We will continue to monitor your kidneys closely.  Continue to monitor blood pressure.  If you continue to have low symptomatic blood pressures, I would like you to follow-up.   Hypotension As your heart beats, it forces blood through your body. This force is called blood pressure. If you have hypotension, you have low blood pressure. When your blood pressure is too low, you may not get enough blood to your brain. You may feel weak, feel light-headed, have a fast heartbeat, or even pass out (faint). Follow these instructions at home: Eating and drinking  Drink enough fluids to keep your pee (urine) clear or pale yellow.  Eat a healthy diet, and follow instructions from your doctor about eating or drinking restrictions. A healthy diet includes: ? Fresh fruits and vegetables. ? Whole grains. ? Low-fat (lean) meats. ? Low-fat dairy products.  Eat extra salt only as told. Do not add extra salt to your diet unless your doctor tells you to.  Eat small meals often.  Avoid standing up quickly after you eat. Medicines  Take over-the-counter and prescription medicines only as told by your doctor. ? Follow instructions from your doctor about changing how much you take (the dosage) of your medicines, if this applies. ? Do not stop or change your medicine on your own. General instructions  Wear compression stockings as told by your doctor.  Get up slowly from lying down or sitting.  Avoid hot showers and a lot of heat as told by your doctor.  Return to your normal activities as told by your doctor. Ask what activities are safe for you.  Do not use any products that contain nicotine or tobacco, such as cigarettes and e-cigarettes. If you need help quitting, ask your doctor.  Keep all follow-up visits as told by your doctor. This is important. Contact a doctor if:  You throw up (vomit).  You have watery poop (diarrhea).  You have a fever for more than 2-3  days.  You feel more thirsty than normal.  You feel weak and tired. Get help right away if:  You have chest pain.  You have a fast or irregular heartbeat.  You lose feeling (get numbness) in any part of your body.  You cannot move your arms or your legs.  You have trouble talking.  You get sweaty or feel light-headed.  You faint.  You have trouble breathing.  You have trouble staying awake.  You feel confused. This information is not intended to replace advice given to you by your health care provider. Make sure you discuss any questions you have with your health care provider. Document Released: 01/05/2010 Document Revised: 06/29/2016 Document Reviewed: 06/29/2016 Elsevier Interactive Patient Education  2017 [employer].

## 2018-06-22 LAB — BASIC METABOLIC PANEL
BUN/Creatinine Ratio: 17 (ref 12–28)
BUN: 22 mg/dL (ref 8–27)
CO2: 29 mmol/L (ref 20–29)
CREATININE: 1.26 mg/dL — AB (ref 0.57–1.00)
Calcium: 9.2 mg/dL (ref 8.7–10.3)
Chloride: 95 mmol/L — ABNORMAL LOW (ref 96–106)
GFR calc Af Amer: 47 mL/min/{1.73_m2} — ABNORMAL LOW (ref >59)
GFR calc non Af Amer: 41 mL/min/{1.73_m2} — ABNORMAL LOW (ref >59)
GLUCOSE: 186 mg/dL — AB (ref 65–99)
Potassium: 4.6 mmol/L (ref 3.5–5.2)
Sodium: 135 mmol/L (ref 134–144)

## 2018-07-03 ENCOUNTER — Telehealth: Payer: Self-pay | Admitting: Family Medicine

## 2018-07-03 DIAGNOSIS — K219 Gastro-esophageal reflux disease without esophagitis: Secondary | ICD-10-CM

## 2018-07-03 MED ORDER — ESOMEPRAZOLE MAGNESIUM 20 MG PO CPDR
20.0000 mg | DELAYED_RELEASE_CAPSULE | Freq: Every day | ORAL | 1 refills | Status: DC
Start: 2018-07-03 — End: 2018-12-05

## 2018-07-03 NOTE — Telephone Encounter (Signed)
Med refilled to mail order pharmacy

## 2018-07-06 DIAGNOSIS — T148XXA Other injury of unspecified body region, initial encounter: Secondary | ICD-10-CM | POA: Diagnosis not present

## 2018-07-06 DIAGNOSIS — H40033 Anatomical narrow angle, bilateral: Secondary | ICD-10-CM | POA: Diagnosis not present

## 2018-07-06 DIAGNOSIS — H2513 Age-related nuclear cataract, bilateral: Secondary | ICD-10-CM | POA: Diagnosis not present

## 2018-07-12 ENCOUNTER — Ambulatory Visit (HOSPITAL_COMMUNITY): Payer: Medicare Other | Admitting: Internal Medicine

## 2018-07-25 ENCOUNTER — Telehealth: Payer: Self-pay | Admitting: Family Medicine

## 2018-07-26 ENCOUNTER — Ambulatory Visit (HOSPITAL_COMMUNITY): Payer: Medicare Other | Admitting: Internal Medicine

## 2018-07-26 ENCOUNTER — Other Ambulatory Visit: Payer: Self-pay | Admitting: *Deleted

## 2018-07-26 DIAGNOSIS — N760 Acute vaginitis: Secondary | ICD-10-CM

## 2018-07-26 DIAGNOSIS — E785 Hyperlipidemia, unspecified: Secondary | ICD-10-CM

## 2018-07-26 DIAGNOSIS — B372 Candidiasis of skin and nail: Secondary | ICD-10-CM

## 2018-07-26 MED ORDER — NYSTATIN 100000 UNIT/GM EX POWD: CUTANEOUS | 3 refills | Status: DC

## 2018-07-26 MED ORDER — ATORVASTATIN CALCIUM 20 MG PO TABS: 10.0000 mg | ORAL_TABLET | Freq: Every day | ORAL | 3 refills | Status: DC

## 2018-07-26 MED ORDER — NYSTATIN-TRIAMCINOLONE 100000-0.1 UNIT/GM-% EX OINT: TOPICAL_OINTMENT | CUTANEOUS | 3 refills | Status: DC

## 2018-07-26 NOTE — Telephone Encounter (Signed)
Patient notified refills sent

## 2018-08-02 ENCOUNTER — Inpatient Hospital Stay (HOSPITAL_COMMUNITY): Payer: Medicare Other | Attending: Hematology | Admitting: Internal Medicine

## 2018-08-02 ENCOUNTER — Other Ambulatory Visit: Payer: Self-pay

## 2018-08-02 ENCOUNTER — Encounter (HOSPITAL_COMMUNITY): Payer: Self-pay | Admitting: Internal Medicine

## 2018-08-02 VITALS — BP 131/56 | HR 75 | Temp 98.4°F | Resp 18 | Wt 163.6 lb

## 2018-08-02 DIAGNOSIS — Z7982 Long term (current) use of aspirin: Secondary | ICD-10-CM | POA: Diagnosis not present

## 2018-08-02 DIAGNOSIS — E11319 Type 2 diabetes mellitus with unspecified diabetic retinopathy without macular edema: Secondary | ICD-10-CM | POA: Insufficient documentation

## 2018-08-02 DIAGNOSIS — R197 Diarrhea, unspecified: Secondary | ICD-10-CM | POA: Diagnosis not present

## 2018-08-02 DIAGNOSIS — E785 Hyperlipidemia, unspecified: Secondary | ICD-10-CM | POA: Insufficient documentation

## 2018-08-02 DIAGNOSIS — Z794 Long term (current) use of insulin: Secondary | ICD-10-CM | POA: Diagnosis not present

## 2018-08-02 DIAGNOSIS — M797 Fibromyalgia: Secondary | ICD-10-CM | POA: Diagnosis not present

## 2018-08-02 DIAGNOSIS — F411 Generalized anxiety disorder: Secondary | ICD-10-CM | POA: Diagnosis not present

## 2018-08-02 DIAGNOSIS — N6489 Other specified disorders of breast: Secondary | ICD-10-CM | POA: Diagnosis not present

## 2018-08-02 DIAGNOSIS — H6983 Other specified disorders of Eustachian tube, bilateral: Secondary | ICD-10-CM | POA: Insufficient documentation

## 2018-08-02 DIAGNOSIS — K589 Irritable bowel syndrome without diarrhea: Secondary | ICD-10-CM | POA: Diagnosis not present

## 2018-08-02 DIAGNOSIS — K219 Gastro-esophageal reflux disease without esophagitis: Secondary | ICD-10-CM | POA: Diagnosis not present

## 2018-08-02 DIAGNOSIS — Z79899 Other long term (current) drug therapy: Secondary | ICD-10-CM | POA: Insufficient documentation

## 2018-08-02 DIAGNOSIS — I129 Hypertensive chronic kidney disease with stage 1 through stage 4 chronic kidney disease, or unspecified chronic kidney disease: Secondary | ICD-10-CM | POA: Insufficient documentation

## 2018-08-02 DIAGNOSIS — N183 Chronic kidney disease, stage 3 (moderate): Secondary | ICD-10-CM | POA: Diagnosis not present

## 2018-08-02 DIAGNOSIS — I10 Essential (primary) hypertension: Secondary | ICD-10-CM | POA: Diagnosis not present

## 2018-08-02 DIAGNOSIS — F418 Other specified anxiety disorders: Secondary | ICD-10-CM | POA: Diagnosis not present

## 2018-08-02 DIAGNOSIS — M810 Age-related osteoporosis without current pathological fracture: Secondary | ICD-10-CM | POA: Diagnosis not present

## 2018-08-02 DIAGNOSIS — K5900 Constipation, unspecified: Secondary | ICD-10-CM | POA: Insufficient documentation

## 2018-08-02 DIAGNOSIS — G4700 Insomnia, unspecified: Secondary | ICD-10-CM | POA: Diagnosis not present

## 2018-08-02 NOTE — Progress Notes (Signed)
Diagnosis No diagnosis found.  Staging Cancer Staging No matching staging information was found for the patient.  Assessment and Plan:  1.  Radial scar of L breast.  Pt was previously followed by Dr. Talbert Cage.  She had complex sclerosing lesion/radial scar showing fibrocystic changes with usual ductal hyperplasia with associated calcifications biopsy proven on 12/12/2015 followed by a left breast lumpectomy by Dr. Marlou Starks III on 01/22/2016 again confirming diagnosis without any identified malignancy. She has chronic left nipple inversion times many years. There was no evidence of atypical ductal hyperplasia or other high risk breast lesion.  Radial scars, also called complex sclerosing lesions, are a pathologic diagnosis, usually discovered incidentally when a breast mass or radiologic abnormality is removed or biopsied. Occasionally, radial scars are large enough to be detected by mammography, which cannot reliably differentiate between these lesions and spiculated carcinoma. Usually these are excised since most series show that 8 to 17 percent of surgical specimens at subsequent excision are positive for malignancy. In addition to the possibility of finding an unrecognized in situ or invasive component, there is some evidence that radial scars may be premalignant lesions, meaning that they can slowly progress from scar to hyperplasia to carcinoma over time.  No additional treatment beyond excision is needed for radial scars. The risk of subsequent breast cancer after excision in this population is small, and chemoprevention is not indicated.  Pt reports she had mammogram done with Dr. Quentin Cornwall this year.  Will obtain reports for review.  Pt will RTC in 1 year for follow-up with mammogram in 2020.    2.  HTN.  BP is 131/56.  Follow-up with PCP.    3.  Constipation/Diarrhea.  PT has history of IBS.  Follow-up with PCP or GI as recommended.    Current Status:  Pt is seen today for follow-up.  She denies any  breast concerns.  She reports she had mammogram done with Dr. Quentin Cornwall.     Problem List Patient Active Problem List   Diagnosis Date Noted  . Disorder of vulva [N90.9] 03/02/2018  . CKD (chronic kidney disease) stage 3, GFR 30-59 ml/min (HCC) [N18.3] 01/04/2017  . Bilateral impacted cerumen [H61.23] 08/18/2016  . Chronic seasonal allergic rhinitis [J30.2] 08/18/2016  . ETD (Eustachian tube dysfunction), bilateral [H69.83] 08/18/2016  . Diabetic neuropathy ([place]) [E11.40] 06/18/2016  . OAB (overactive bladder) [N32.81] 06/18/2016  . Radial scar of breast [N64.89] 06/04/2016  . Healthcare maintenance [Z00.00] 01/19/2016  . Diabetic retinopathy ([place]) [E11.319] 09/16/2015  . Overweight (BMI 25.0-29.9) [E66.3] 07/04/2014  . Aneurysm of iliac artery ([place]) [I72.3] 01/07/2014  . GERD (gastroesophageal reflux disease) [K21.9] 03/23/2013  . Type 2 diabetes mellitus with insulin therapy ([place]) [E11.9, Z79.4] 03/23/2013  . Insomnia [G47.00] 03/23/2013  . Other malaise and fatigue [R53.81, R53.83] 03/23/2013  . MITRAL VALVE PROLAPSE [I05.9] 07/17/2009  . Lichen planus [Z66.0] 07/17/2009  . Fibromyalgia [M79.7] 07/17/2009  . VAGINITIS [N76.0] 04/04/2009  . Urinary incontinence [R32] 03/14/2009  . CHRONIC ULCER OF OTHER SPECIFIED SITE [L98.499] 12/24/2008  . Irritable bowel syndrome [K58.9] 08/29/2008  . BACK PAIN, LUMBAR [M54.5] 07/17/2008  . ACUTE CYSTITIS [N30.00] 01/24/2008  . UNSPECIFIED OSTEOPOROSIS [M81.0] 01/10/2008  . Dyslipidemia associated with type 2 diabetes mellitus ([place]) [E11.69, E78.5] 05/22/2007  . Generalized anxiety disorder [F41.1] 05/22/2007  . Essential hypertension [I10] 05/22/2007    Past Medical History Past Medical History:  Diagnosis Date  . Acute cystitis   . Anxiety   . Depression   . Diabetes mellitus type II   .  Diabetic retinopathy   . DM type 2 causing CKD stage 3 ([place])   . Fibromyalgia   . GERD (gastroesophageal reflux disease)   . Hyperlipidemia   .  Hypertension   . IBS (irritable bowel syndrome)   . IBS (irritable bowel syndrome)   . Insomnia   . Lichen planus    vulvar  . Pancreatitis, gallstone   . Postmenopausal   . Radial scar of breast 06/04/2016  . Urge incontinence of urine     Past Surgical History Past Surgical History:  Procedure Laterality Date  . ANKLE SURGERY  02/2004  . BREAST LUMPECTOMY WITH RADIOACTIVE SEED LOCALIZATION Left 01/22/2016   Procedure: LEFT BREAST LUMPECTOMY WITH RADIOACTIVE SEED LOCALIZATION;  Surgeon: Autumn Messing III, MD;  Location: [facility];  Service: General;  Laterality: Left;  . CATARACT EXTRACTION W/ INTRAOCULAR LENS IMPLANT  09/2003, 12/2003  . CHOLECYSTECTOMY  2004  . EYE SURGERY     laser treatments  . JOINT REPLACEMENT    . TOTAL KNEE ARTHROPLASTY     Left  . TRIGGER FINGER RELEASE Left 01/30/2002  . TUBAL LIGATION  1970    Family History Family History  Problem Relation Age of Onset  . Diabetes Mother   . Hypertension Mother   . Kidney disease Mother   . Heart disease Mother   . Peripheral vascular disease Mother        amputation  . Cancer Father        prostate  . Diabetes Father   . Heart disease Father        CABG  . Hypertension Father   . Heart attack Father   . Depression Sister   . Hypertension Sister   . Neuropathy Sister      Social History  reports that she has never smoked. She has never used smokeless tobacco. She reports that she does not drink alcohol or use drugs.  Medications  Current Outpatient Medications:  .  acetaminophen (TYLENOL) 325 MG tablet, Take 325 mg by mouth as needed., Disp: , Rfl:  .  aspirin 81 MG tablet, Take 81 mg by mouth every other day. , Disp: , Rfl:  .  atorvastatin (LIPITOR) 20 MG tablet, Take 0.5 tablets (10 mg total) by mouth at bedtime., Disp: 90 tablet, Rfl: 3 .  Calcium Carb-Cholecalciferol (CALCIUM 600+D3) 600-200 MG-UNIT TABS, Take 1 tablet by mouth 2 (two) times daily.  , Disp: , Rfl:  .  cephALEXin  (KEFLEX) 500 MG capsule, Take 1 capsule by mouth daily., Disp: , Rfl:  .  desonide (DESOWEN) 0.05 % ointment, Apply 1 application topically 2 (two) times daily. , Disp: , Rfl:  .  diclofenac sodium (VOLTAREN) 1 % GEL, Apply 4 g topically 4 (four) times daily., Disp: 100 g, Rfl: 3 .  Dulaglutide (TRULICITY) 1.5 XT/0.6YI SOPN, Inject 1.5 mg into the skin once a week., Disp: 12 pen, Rfl: 3 .  escitalopram (LEXAPRO) 20 MG tablet, Take 1 tablet (20 mg total) by mouth daily., Disp: 90 tablet, Rfl: 2 .  esomeprazole (NEXIUM) 20 MG capsule, Take 1 capsule (20 mg total) by mouth daily at 12 noon., Disp: 90 capsule, Rfl: 1 .  estradiol (ESTRACE) 0.1 MG/GM vaginal cream, Place 2 g vaginally every Monday, Wednesday, and Friday. , Disp: , Rfl:  .  fluticasone (FLONASE) 50 MCG/ACT nasal spray, Place 2 sprays into both nostrils daily., Disp: 16 g, Rfl: 6 .  glucose blood (FREESTYLE LITE) test strip, Use qid to check blood  sugar. Dx E11.9, Disp: 200 each, Rfl: 11 .  insulin aspart (NOVOLOG FLEXPEN) 100 UNIT/ML FlexPen, BG less than 80 - no insulin; 80 to 120 - 3 units; 121 to 150 - 4 units; 151 to 200 - 5 units; 201 to 250 - 6 units; 250 or above - 7 units, Disp: 30 mL, Rfl: 3 .  Insulin Glargine (LANTUS SOLOSTAR) 100 UNIT/ML Solostar Pen, Inject 32 Units into the skin every morning., Disp: 45 mL, Rfl: 3 .  Insulin Pen Needle 31G X 5 MM MISC, 1 Device by Does not apply route 4 (four) times daily., Disp: 300 each, Rfl: 4 .  mirabegron ER (MYRBETRIQ) 50 MG TB24 tablet, Take 50 mg by mouth daily., Disp: , Rfl:  .  Multiple Vitamin (MULTIVITAMIN) tablet, Take 1 tablet by mouth daily. , Disp: , Rfl:  .  nystatin (NYSTATIN) powder, APPLY UNDER BILATERAL BREASTS AND GROIN TWICE A DAY, Disp: 60 g, Rfl: 3 .  nystatin-triamcinolone ointment (MYCOLOG), APPLY TOPICALLY 2 (TWO) TIMES DAILY., Disp: 60 g, Rfl: 3 .  ondansetron (ZOFRAN) 4 MG tablet, Take 1 tablet (4 mg total) by mouth every 8 (eight) hours as needed for nausea or  vomiting., Disp: 20 tablet, Rfl: 0 .  Polyethyl Glycol-Propyl Glycol 0.4-0.3 % SOLN, Apply 1 drop to eye daily as needed (for dry eye relief). , Disp: , Rfl:  .  polyethylene glycol (MIRALAX / GLYCOLAX) packet, Take 17 g by mouth daily as needed. , Disp: , Rfl:  .  Probiotic Product (PROBIOTIC ADVANCED PO), Take 1 capsule by mouth daily. , Disp: , Rfl:  .  traZODone (DESYREL) 150 MG tablet, Take 0.5 tablets (75 mg total) by mouth at bedtime., Disp: 45 tablet, Rfl: 3  Allergies Latex; Ambien [zolpidem]; Augmentin [amoxicillin-pot clavulanate]; Belsomra [suvorexant]; Hydrocodone-acetaminophen; Hyoscyamine sulfate; Melatonin; Mobic [meloxicam]; Morphine; Oxycodone; Tramadol; Celebrex [celecoxib]; and Sulfa antibiotics  Review of Systems Review of Systems - Oncology ROS negative other than constipation and diarrhea.     Physical Exam  Vitals Wt Readings from Last 3 Encounters:  08/02/18 163 lb 9.6 oz (74.2 kg)  06/21/18 165 lb (74.8 kg)  05/26/18 165 lb (74.8 kg)   Temp Readings from Last 3 Encounters:  08/02/18 98.4 F (36.9 C) (Oral)  06/21/18 98.4 F (36.9 C) (Oral)  05/26/18 98.6 F (37 C) (Oral)   BP Readings from Last 3 Encounters:  08/02/18 (!) 131/56  06/21/18 134/70  05/26/18 121/66   Pulse Readings from Last 3 Encounters:  08/02/18 75  06/21/18 75  05/26/18 77   Constitutional: Well-developed, well-nourished, and in no distress.   HENT: Head: Normocephalic and atraumatic.  Mouth/Throat: No oropharyngeal exudate. Mucosa moist. Eyes: Pupils are equal, round, and reactive to light. Conjunctivae are normal. No scleral icterus.  Neck: Normal range of motion. Neck supple. No JVD present.  Cardiovascular: Normal rate, regular rhythm and normal heart sounds.  Exam reveals no gallop and no friction rub.   No murmur heard. Pulmonary/Chest: Effort normal and breath sounds normal. No respiratory distress. No wheezes.No rales.  Abdominal: Soft. Bowel sounds are normal. No  distension. There is no tenderness. There is no guarding.  Musculoskeletal: No edema or tenderness.  Lymphadenopathy: No cervical, axillary or supraclavicular adenopathy.  Neurological: Alert and oriented to person, place, and time. No cranial nerve deficit.  Skin: Skin is warm and dry. No rash noted. No erythema. No pallor.  Psychiatric: Affect and judgment normal.  Breast exam:  Chaperone present.  Lumpectomy scar noted left breast.  No  palpable masses noted bilaterally.    Labs No visits with results within 3 Day(s) from this visit.  Latest known visit with results is:  Scanned Document on 07/20/2018  Component Date Value Ref Range Status  . HM Diabetic Eye Exam 04/26/2018 Retinopathy* No Retinopathy Final   Tempie Hoist, MD     Pathology No orders of the defined types were placed in this encounter.      Zoila Shutter MD

## 2018-08-15 DIAGNOSIS — E1142 Type 2 diabetes mellitus with diabetic polyneuropathy: Secondary | ICD-10-CM | POA: Diagnosis not present

## 2018-08-15 DIAGNOSIS — L84 Corns and callosities: Secondary | ICD-10-CM | POA: Diagnosis not present

## 2018-08-15 DIAGNOSIS — M79676 Pain in unspecified toe(s): Secondary | ICD-10-CM | POA: Diagnosis not present

## 2018-08-15 DIAGNOSIS — B351 Tinea unguium: Secondary | ICD-10-CM | POA: Diagnosis not present

## 2018-08-28 ENCOUNTER — Other Ambulatory Visit: Payer: Self-pay | Admitting: Family Medicine

## 2018-08-28 ENCOUNTER — Ambulatory Visit (INDEPENDENT_AMBULATORY_CARE_PROVIDER_SITE_OTHER): Payer: Medicare Other | Admitting: Family Medicine

## 2018-08-28 VITALS — BP 118/62 | HR 74 | Temp 96.9°F | Ht 63.0 in | Wt 162.0 lb

## 2018-08-28 DIAGNOSIS — Z794 Long term (current) use of insulin: Secondary | ICD-10-CM

## 2018-08-28 DIAGNOSIS — E1169 Type 2 diabetes mellitus with other specified complication: Secondary | ICD-10-CM

## 2018-08-28 DIAGNOSIS — E1159 Type 2 diabetes mellitus with other circulatory complications: Secondary | ICD-10-CM

## 2018-08-28 DIAGNOSIS — Z23 Encounter for immunization: Secondary | ICD-10-CM | POA: Diagnosis not present

## 2018-08-28 DIAGNOSIS — I10 Essential (primary) hypertension: Secondary | ICD-10-CM

## 2018-08-28 DIAGNOSIS — E119 Type 2 diabetes mellitus without complications: Secondary | ICD-10-CM

## 2018-08-28 DIAGNOSIS — K580 Irritable bowel syndrome with diarrhea: Secondary | ICD-10-CM

## 2018-08-28 DIAGNOSIS — E785 Hyperlipidemia, unspecified: Secondary | ICD-10-CM | POA: Diagnosis not present

## 2018-08-28 DIAGNOSIS — E1122 Type 2 diabetes mellitus with diabetic chronic kidney disease: Secondary | ICD-10-CM

## 2018-08-28 DIAGNOSIS — I152 Hypertension secondary to endocrine disorders: Secondary | ICD-10-CM

## 2018-08-28 DIAGNOSIS — N183 Chronic kidney disease, stage 3 unspecified: Secondary | ICD-10-CM

## 2018-08-28 LAB — BAYER DCA HB A1C WAIVED: HB A1C: 6.5 % (ref <7.0)

## 2018-08-28 MED ORDER — RIFAXIMIN 200 MG PO TABS: 200.0000 mg | ORAL_TABLET | Freq: Three times a day (TID) | ORAL | 0 refills | Status: DC

## 2018-08-28 MED ORDER — RIFAXIMIN 550 MG PO TABS: 550.0000 mg | ORAL_TABLET | Freq: Three times a day (TID) | ORAL | 0 refills | Status: AC

## 2018-08-28 MED ORDER — INSULIN GLARGINE 100 UNIT/ML SOLOSTAR PEN: 27.0000 [IU] | PEN_INJECTOR | SUBCUTANEOUS | 3 refills | Status: DC

## 2018-08-28 NOTE — Patient Instructions (Addendum)
Your A1c was 6.5 today.  Reduce your Lantus to 27 units daily and see if this resolves your low blood sugar episodes.  See me in 3 months for Diabetes recheck.  You will be due for fasting labs in May.  I have prescribed Xifaxin for your diarrhea, likely related to IBS.  Continue your probiotics.   Diarrhea, Adult Diarrhea is when you have loose and water poop (stool) often. Diarrhea can make you feel weak and cause you to get dehydrated. Dehydration can make you tired and thirsty, make you have a dry mouth, and make it so you pee (urinate) less often. Diarrhea often lasts 2-3 days. However, it can last longer if it is a sign of something more serious. It is important to treat your diarrhea as told by your doctor. Follow these instructions at home: Eating and drinking  Follow these recommendations as told by your doctor:  Take an oral rehydration solution (ORS). This is a drink that is sold at pharmacies and stores.  Drink clear fluids, such as: ? Water. ? Ice chips. ? Diluted fruit juice. ? Low-calorie sports drinks.  Eat bland, easy-to-digest foods in small amounts as you are able. These foods include: ? Bananas. ? Applesauce. ? Rice. ? Low-fat (lean) meats. ? Toast. ? Crackers.  Avoid drinking fluids that have a lot of sugar or caffeine in them.  Avoid alcohol.  Avoid spicy or fatty foods.  General instructions   Drink enough fluid to keep your pee (urine) clear or pale yellow.  Wash your hands often. If you cannot use soap and water, use hand sanitizer.  Make sure that all people in your home wash their hands well and often.  Take over-the-counter and prescription medicines only as told by your doctor.  Rest at home while you get better.  Watch your condition for any changes.  Take a warm bath to help with any burning or pain from having diarrhea.  Keep all follow-up visits as told by your doctor. This is important. Contact a doctor if:  You have a  fever.  Your diarrhea gets worse.  You have new symptoms.  You cannot keep fluids down.  You feel light-headed or dizzy.  You have a headache.  You have muscle cramps. Get help right away if:  You have chest pain.  You feel very weak or you pass out (faint).  You have bloody or black poop or poop that look like tar.  You have very bad pain, cramping, or bloating in your belly (abdomen).  You have trouble breathing or you are breathing very quickly.  Your heart is beating very quickly.  Your skin feels cold and clammy.  You feel confused.  You have signs of dehydration, such as: ? Dark pee, hardly any pee, or no pee. ? Cracked lips. ? Dry mouth. ? Sunken eyes. ? Sleepiness. ? Weakness. This information is not intended to replace advice given to you by your health care provider. Make sure you discuss any questions you have with your health care provider. Document Released: 03/29/2008 Document Revised: 04/30/2016 Document Reviewed: 06/17/2015 Elsevier Interactive Patient Education  2018 [employer].

## 2018-08-28 NOTE — Progress Notes (Addendum)
Subjective: CC: DM PCP: [name] Norlander, DO IEP:PIRJJ [name] is a 77 y.o. female presenting to clinic today for:  1. Type 2 Diabetes w/ HTN and HLD.  She has known CKD 3. Patient reports: High at home: 161; Low at home: 37s (occurred overnight.  She notes she is typically symptomatic <100).  She has had some <100 BGs during the daytime.  She drinks OJ for this.  Typical FBG is around 110-120s, Taking medication(s): Lantus 28 u QAM and Trulicity 1.5mg  q week, lisinopril 2.5 mg daily and atorvastatin 20 mg daily.  Last eye exam: Dr Zigmund Daniel in [facility], retinal specialist q6 months. Last foot exam: Due 09/2017 Last A1c: 6.0 on 02/23/18 Nephropathy screen indicated?: Done 05/2018 normal.  But on lisinopril 2.5 mg daily because of CKD. Last flu, zoster and/or pneumovax:  Needs influenza  ROS: denies fever, chills, dizziness, LOC, polyuria, polydipsia, unintended weight loss/gain, foot ulcerations, she reports chronic numbness or tingling in bilateral hands and feet.  Denies chest pain.  2.  Diarrhea Patient reports a 1 week history of daily diarrhea.  This seems to occur each morning.  She describes as nonbloody.  She reports associated abdominal cramping.  No nausea, vomiting, hematochezia, melena or fevers.  She has a history of IBS [name] and D.  She is on daily probiotics and also takes yogurt daily.  She is attempted to eat a bland her diet but worries that it may affect her sugars.  Denies any consumption of undercooked foods, foods left out too long.  No sick contacts.   ROS: Per HPI  Allergies  Allergen Reactions  . Latex Anaphylaxis and Rash  . Ambien [Zolpidem] Other (See Comments)    Hallucinations  . Augmentin [Amoxicillin-Pot Clavulanate] Nausea And Vomiting  . Belsomra [Suvorexant] Other (See Comments)    Hallucinations  . Hydrocodone-Acetaminophen Other (See Comments)    hallucinations  . Hyoscyamine Sulfate Other (See Comments)  . Melatonin Other (See Comments)    Adverse  reaction-"keeps me awake"  . Mobic [Meloxicam]   . Morphine Nausea And Vomiting    REACTION: hallucinations,GI upset  . Oxycodone Other (See Comments)    Hallucinations  . Tramadol     Unknown reaction  . Celebrex [Celecoxib] Itching and Rash  . Sulfa Antibiotics Rash   Past Medical History:  Diagnosis Date  . Acute cystitis   . Anxiety   . Depression   . Diabetes mellitus type II   . Diabetic retinopathy   . DM type 2 causing CKD stage 3 ([place])   . Fibromyalgia   . GERD (gastroesophageal reflux disease)   . Hyperlipidemia   . Hypertension   . IBS (irritable bowel syndrome)   . IBS (irritable bowel syndrome)   . Insomnia   . Lichen planus    vulvar  . Pancreatitis, gallstone   . Postmenopausal   . Radial scar of breast 06/04/2016  . Urge incontinence of urine     Current Outpatient Medications:  .  acetaminophen (TYLENOL) 325 MG tablet, Take 325 mg by mouth as needed., Disp: , Rfl:  .  aspirin 81 MG tablet, Take 81 mg by mouth every other day. , Disp: , Rfl:  .  atorvastatin (LIPITOR) 20 MG tablet, Take 0.5 tablets (10 mg total) by mouth at bedtime., Disp: 90 tablet, Rfl: 3 .  Calcium Carb-Cholecalciferol (CALCIUM 600+D3) 600-200 MG-UNIT TABS, Take 1 tablet by mouth 2 (two) times daily.  , Disp: , Rfl:  .  cephALEXin (KEFLEX) 500  MG capsule, Take 1 capsule by mouth daily., Disp: , Rfl:  .  desonide (DESOWEN) 0.05 % ointment, Apply 1 application topically 2 (two) times daily. , Disp: , Rfl:  .  diclofenac sodium (VOLTAREN) 1 % GEL, Apply 4 g topically 4 (four) times daily., Disp: 100 g, Rfl: 3 .  Dulaglutide (TRULICITY) 1.5 YB/0.1BP SOPN, Inject 1.5 mg into the skin once a week., Disp: 12 pen, Rfl: 3 .  escitalopram (LEXAPRO) 20 MG tablet, Take 1 tablet (20 mg total) by mouth daily., Disp: 90 tablet, Rfl: 2 .  esomeprazole (NEXIUM) 20 MG capsule, Take 1 capsule (20 mg total) by mouth daily at 12 noon., Disp: 90 capsule, Rfl: 1 .  estradiol (ESTRACE) 0.1 MG/GM vaginal cream,  Place 2 g vaginally every Monday, Wednesday, and Friday. , Disp: , Rfl:  .  fluticasone (FLONASE) 50 MCG/ACT nasal spray, Place 2 sprays into both nostrils daily., Disp: 16 g, Rfl: 6 .  glucose blood (FREESTYLE LITE) test strip, Use qid to check blood sugar. Dx E11.9, Disp: 200 each, Rfl: 11 .  insulin aspart (NOVOLOG FLEXPEN) 100 UNIT/ML FlexPen, BG less than 80 - no insulin; 80 to 120 - 3 units; 121 to 150 - 4 units; 151 to 200 - 5 units; 201 to 250 - 6 units; 250 or above - 7 units, Disp: 30 mL, Rfl: 3 .  Insulin Glargine (LANTUS SOLOSTAR) 100 UNIT/ML Solostar Pen, Inject 32 Units into the skin every morning., Disp: 45 mL, Rfl: 3 .  Insulin Pen Needle 31G X 5 MM MISC, 1 Device by Does not apply route 4 (four) times daily., Disp: 300 each, Rfl: 4 .  mirabegron ER (MYRBETRIQ) 50 MG TB24 tablet, Take 50 mg by mouth daily., Disp: , Rfl:  .  Multiple Vitamin (MULTIVITAMIN) tablet, Take 1 tablet by mouth daily. , Disp: , Rfl:  .  nystatin (NYSTATIN) powder, APPLY UNDER BILATERAL BREASTS AND GROIN TWICE A DAY, Disp: 60 g, Rfl: 3 .  nystatin-triamcinolone ointment (MYCOLOG), APPLY TOPICALLY 2 (TWO) TIMES DAILY., Disp: 60 g, Rfl: 3 .  ondansetron (ZOFRAN) 4 MG tablet, Take 1 tablet (4 mg total) by mouth every 8 (eight) hours as needed for nausea or vomiting., Disp: 20 tablet, Rfl: 0 .  Polyethyl Glycol-Propyl Glycol 0.4-0.3 % SOLN, Apply 1 drop to eye daily as needed (for dry eye relief). , Disp: , Rfl:  .  polyethylene glycol (MIRALAX / GLYCOLAX) packet, Take 17 g by mouth daily as needed. , Disp: , Rfl:  .  Probiotic Product (PROBIOTIC ADVANCED PO), Take 1 capsule by mouth daily. , Disp: , Rfl:  .  traZODone (DESYREL) 150 MG tablet, Take 0.5 tablets (75 mg total) by mouth at bedtime., Disp: 45 tablet, Rfl: 3 Social History   Socioeconomic History  . Marital status: Widowed    Spouse name: Not on file  . Number of children: 3  . Years of education: 57  . Highest education level: Some college, no  degree  Occupational History  . Occupation: retired    Fish farm manager: RETIRED    Comment: bookeeping  Social Needs  . Financial resource strain: Not hard at all  . Food insecurity:    Worry: Never true    Inability: Never true  . Transportation needs:    Medical: No    Non-medical: No  Tobacco Use  . Smoking status: Never Smoker  . Smokeless tobacco: Never Used  Substance and Sexual Activity  . Alcohol use: No  . Drug use: No  .  Sexual activity: Not Currently  Lifestyle  . Physical activity:    Days per week: 3 days    Minutes per session: 30 min  . Stress: Only a little  Relationships  . Social connections:    Talks on phone: Not on file    Gets together: Not on file    Attends religious service: Not on file    Active member of club or organization: Not on file    Attends meetings of clubs or organizations: Not on file    Relationship status: Not on file  . Intimate partner violence:    Fear of current or ex partner: Not on file    Emotionally abused: Not on file    Physically abused: Not on file    Forced sexual activity: Not on file  Other Topics Concern  . Not on file  Social History Narrative   Widowed in 2010   Family History  Problem Relation Age of Onset  . Diabetes Mother   . Hypertension Mother   . Kidney disease Mother   . Heart disease Mother   . Peripheral vascular disease Mother        amputation  . Cancer Father        prostate  . Diabetes Father   . Heart disease Father        CABG  . Hypertension Father   . Heart attack Father   . Depression Sister   . Hypertension Sister   . Neuropathy Sister     Objective: Office vital signs reviewed. BP 118/62   Pulse 74   Temp (!) 96.9 F (36.1 [name]) (Oral)   Ht 5\' 3"  (1.6 m)   Wt 162 lb (73.5 kg)   BMI 28.70 kg/m   Physical Examination:  General: Awake, alert, well nourished, No acute distress HEENT: Normal    Eyes: PERRLA, extraocular membranes intact, sclera white    Throat: moist mucus  membranes Cardio: regular rate and rhythm, S1S2 heard, no murmurs appreciated Pulm: clear to auscultation bilaterally, no wheezes, rhonchi or rales; normal work of breathing on room air GI: soft, mild generalized tenderness. Negative Murphy's.  Negative McBurney's, non-distended, bowel sounds present and hyperactive x4, no hepatomegaly, no splenomegaly, no masses Extremities: warm, well perfused, No edema, cyanosis or clubbing; +2 pulses bilaterally Neuro: see below Diabetic Foot Exam - Simple   Simple Foot Form Diabetic Foot exam was performed with the following findings:  Yes 08/28/2018  2:35 PM  Visual Inspection No deformities, no ulcerations, no other skin breakdown bilaterally:  Yes Sensation Testing See comments:  Yes Pulse Check Posterior Tibialis and Dorsalis pulse intact bilaterally:  Yes Comments Decreased monofilament sensation on right greater than left.  She has surprisingly normal vibratory sense bilaterally.  No ulcers or calluses noted.     Assessment/ Plan: 77 y.o. female   1. Type 2 diabetes mellitus with insulin therapy ([place]) Blood sugars under excellent control with A1c of 6.5 today.  Because she is having intermittent hypoglycemic episodes we discussed decreasing the Lantus to 27 units every morning.  She will continue monitoring blood sugars daily as directed.  Continue Trulicity.  She will follow-up in 3 months or sooner if needed. - Bayer DCA Hb A1c Waived  2. Dyslipidemia associated with type 2 diabetes mellitus ([facility]) Continue Lipitor 20 mg daily.  3. Hypertension associated with diabetes ([place]) Blood pressures under good control during today's visit.  Continue lisinopril 2.5 mg daily for CKD 3  4. Irritable bowel syndrome with  diarrhea I suspect that her symptoms are related to IBS D.  Nothing on exam to suggest infectious etiology.  I have prescribed her Xifaxan 550 mg 3 times daily for the next 2 weeks since her symptoms are refractory to diet  modification, probiotics and yogurt.  5. Encounter for immunization Influenza vaccine administered. - Flu vaccine HIGH DOSE PF   Orders Placed This Encounter  Procedures  . Flu vaccine HIGH DOSE PF  . Bayer DCA Hb A1c Waived   Meds ordered this encounter  Medications  . DISCONTD: rifaximin (XIFAXAN) 200 MG tablet    Sig: Take 1 tablet (200 mg total) by mouth 3 (three) times daily for 14 days.    Dispense:  42 tablet    Refill:  0  . rifaximin (XIFAXAN) 550 MG TABS tablet    Sig: Take 1 tablet (550 mg total) by mouth 3 (three) times daily for 14 days.    Dispense:  42 tablet    Refill:  0    Please disregard previous Rx for 220mg .  . Insulin Glargine (LANTUS SOLOSTAR) 100 UNIT/ML Solostar Pen    Sig: Inject 27 Units into the skin every morning.    Dispense:  45 mL    Refill:  [place], [place] [phone]

## 2018-10-30 ENCOUNTER — Encounter (INDEPENDENT_AMBULATORY_CARE_PROVIDER_SITE_OTHER): Payer: Medicare Other | Admitting: Ophthalmology

## 2018-10-30 DIAGNOSIS — E113393 Type 2 diabetes mellitus with moderate nonproliferative diabetic retinopathy without macular edema, bilateral: Secondary | ICD-10-CM

## 2018-10-30 DIAGNOSIS — I10 Essential (primary) hypertension: Secondary | ICD-10-CM | POA: Diagnosis not present

## 2018-10-30 DIAGNOSIS — H35033 Hypertensive retinopathy, bilateral: Secondary | ICD-10-CM

## 2018-10-30 DIAGNOSIS — E11319 Type 2 diabetes mellitus with unspecified diabetic retinopathy without macular edema: Secondary | ICD-10-CM

## 2018-10-30 DIAGNOSIS — H43813 Vitreous degeneration, bilateral: Secondary | ICD-10-CM | POA: Diagnosis not present

## 2018-10-30 LAB — HM DIABETES EYE EXAM

## 2018-11-14 DIAGNOSIS — E1142 Type 2 diabetes mellitus with diabetic polyneuropathy: Secondary | ICD-10-CM | POA: Diagnosis not present

## 2018-11-14 DIAGNOSIS — B351 Tinea unguium: Secondary | ICD-10-CM | POA: Diagnosis not present

## 2018-11-14 DIAGNOSIS — M79676 Pain in unspecified toe(s): Secondary | ICD-10-CM | POA: Diagnosis not present

## 2018-11-14 DIAGNOSIS — L84 Corns and callosities: Secondary | ICD-10-CM | POA: Diagnosis not present

## 2018-11-15 DIAGNOSIS — E1140 Type 2 diabetes mellitus with diabetic neuropathy, unspecified: Secondary | ICD-10-CM | POA: Diagnosis not present

## 2018-11-15 DIAGNOSIS — Z794 Long term (current) use of insulin: Secondary | ICD-10-CM | POA: Diagnosis not present

## 2018-11-29 DIAGNOSIS — E1140 Type 2 diabetes mellitus with diabetic neuropathy, unspecified: Secondary | ICD-10-CM | POA: Diagnosis not present

## 2018-11-29 DIAGNOSIS — Z794 Long term (current) use of insulin: Secondary | ICD-10-CM | POA: Diagnosis not present

## 2018-12-04 ENCOUNTER — Telehealth: Payer: Self-pay | Admitting: Family Medicine

## 2018-12-04 NOTE — Telephone Encounter (Signed)
Has an appt tomorrow 2/11

## 2018-12-05 ENCOUNTER — Ambulatory Visit (INDEPENDENT_AMBULATORY_CARE_PROVIDER_SITE_OTHER): Payer: Medicare Other | Admitting: Family Medicine

## 2018-12-05 ENCOUNTER — Encounter: Payer: Self-pay | Admitting: Family Medicine

## 2018-12-05 VITALS — BP 121/62 | HR 79 | Temp 97.9°F | Ht 63.0 in | Wt 158.0 lb

## 2018-12-05 DIAGNOSIS — Z794 Long term (current) use of insulin: Secondary | ICD-10-CM | POA: Diagnosis not present

## 2018-12-05 DIAGNOSIS — R413 Other amnesia: Secondary | ICD-10-CM | POA: Insufficient documentation

## 2018-12-05 DIAGNOSIS — K582 Mixed irritable bowel syndrome: Secondary | ICD-10-CM | POA: Diagnosis not present

## 2018-12-05 DIAGNOSIS — E1169 Type 2 diabetes mellitus with other specified complication: Secondary | ICD-10-CM | POA: Diagnosis not present

## 2018-12-05 DIAGNOSIS — M542 Cervicalgia: Secondary | ICD-10-CM | POA: Diagnosis not present

## 2018-12-05 DIAGNOSIS — E1159 Type 2 diabetes mellitus with other circulatory complications: Secondary | ICD-10-CM | POA: Diagnosis not present

## 2018-12-05 DIAGNOSIS — I10 Essential (primary) hypertension: Secondary | ICD-10-CM

## 2018-12-05 DIAGNOSIS — K219 Gastro-esophageal reflux disease without esophagitis: Secondary | ICD-10-CM | POA: Diagnosis not present

## 2018-12-05 DIAGNOSIS — N183 Chronic kidney disease, stage 3 (moderate): Secondary | ICD-10-CM | POA: Diagnosis not present

## 2018-12-05 DIAGNOSIS — E785 Hyperlipidemia, unspecified: Secondary | ICD-10-CM

## 2018-12-05 DIAGNOSIS — E1122 Type 2 diabetes mellitus with diabetic chronic kidney disease: Secondary | ICD-10-CM

## 2018-12-05 MED ORDER — INSULIN GLARGINE 100 UNIT/ML SOLOSTAR PEN: 26.0000 [IU] | PEN_INJECTOR | SUBCUTANEOUS | 3 refills | Status: DC

## 2018-12-05 MED ORDER — ESOMEPRAZOLE MAGNESIUM 20 MG PO CPDR: 20.0000 mg | DELAYED_RELEASE_CAPSULE | Freq: Every day | ORAL | 1 refills | Status: DC

## 2018-12-05 MED ORDER — INSULIN ASPART 100 UNIT/ML FLEXPEN: PEN_INJECTOR | SUBCUTANEOUS | 3 refills | Status: DC

## 2018-12-05 MED ORDER — RIFAXIMIN 550 MG PO TABS: 550.0000 mg | ORAL_TABLET | Freq: Three times a day (TID) | ORAL | 0 refills | Status: AC

## 2018-12-05 NOTE — Patient Instructions (Addendum)
Your mental test was normal today.  I do not suspect Alzheimers or dementia.  You have normal changes in memory.  This test will be repeated at your Annual wellness visit.  We discussed that the Rifaxamin should only be used twice for IBS-diarrhea.  This will be the final treatment course.  If you have persistent symptoms, we should have you see the gastric specialist again.  Management of Memory Problems  There are some general things you can do to help manage your memory problems.  Your memory may not in fact recover, but by using techniques and strategies you will be able to manage your memory difficulties better.  1)  Establish a routine.  Try to establish and then stick to a regular routine.  By doing this, you will get used to what to expect and you will reduce the need to rely on your memory.  Also, try to do things at the same time of day, such as taking your medication or checking your calendar first thing in the morning.  Think about think that you can do as a part of a regular routine and make a list.  Then enter them into a daily planner to remind you.  This will help you establish a routine.  2)  Organize your environment.  Organize your environment so that it is uncluttered.  Decrease visual stimulation.  Place everyday items such as keys or cell phone in the same place every day (ie.  Basket next to front door)  Use post it notes with a brief message to yourself (ie. Turn off light, lock the door)  Use labels to indicate where things go (ie. Which cupboards are for food, dishes, etc.)  Keep a notepad and pen by the telephone to take messages  3)  Memory Aids  A diary or journal/notebook/daily planner  Making a list (shopping list, chore list, to do list that needs to be done)  Using an alarm as a reminder (kitchen timer or cell phone alarm)  Using cell phone to store information (Notes, Calendar, Reminders)  Calendar/White board placed in a prominent  position  Post-it notes  In order for memory aids to be useful, you need to have good habits.  It's no good remembering to make a note in your journal if you don't remember to look in it.  Try setting aside a certain time of day to look in journal.  4)  Improving mood and managing fatigue.  There may be other factors that contribute to memory difficulties.  Factors, such as anxiety, depression and tiredness can affect memory.  Regular gentle exercise can help improve your mood and give you more energy.  Simple relaxation techniques may help relieve symptoms of anxiety  Try to get back to completing activities or hobbies you enjoyed doing in the past.  Learn to pace yourself through activities to decrease fatigue.  Find out about some local support groups where you can share experiences with others.  Try and achieve 7-8 hours of sleep at night.

## 2018-12-05 NOTE — Progress Notes (Signed)
Subjective: CC: DM PCP: [name], DO TSV:XBLTJ [name] is a 78 y.o. female presenting to clinic today for:  1. Type 2 Diabetes w/ HTN and HLD.  She has known CKD 3. Patient reports: High at home: 228 (evening BG); Low at home: 98.  FBG 86-195.  Seeing endocrinology q6 months. Taking medication(s): Lantus 27 u QAM and Trulicity 0.3ES q week, and atorvastatin 20 mg daily.  Last eye exam: Dr Zigmund Daniel in [facility], retinal specialist q6 months. Last foot exam: Due 08/2018 Last A1c: 6.7 on 11/15/2018 w/ Dr Steffanie Dunn Nephropathy screen indicated?: Done 05/2018 normal, ACE-I dc'd by Endocrinology for hypotension Last flu, zoster and/or pneumovax:  UTD  ROS: denies dizziness, LOC, polyuria, polydipsia, unintended weight loss/gain, foot ulcerations or chest pain.  She reports chronic numbness or tingling in bilateral hands and feet.    2.  IBS-Mixed/GERD She reports total resolution of diarrhea after treatment with Xifaxan in November.  However, over the last 2 weeks she started having recurrence.  Diarrhea is not occurring every day but perhaps every other day.  She is having normal bowel movements in between.  She would like to proceed with a second course of Xifaxan to see if she can get total resolution.  No hematochezia, melena, nausea or vomiting.  She does have mild generalized abdominal pain.  She also needs refills on her Nexium, which is controlling her acid reflux symptoms.  3.  Neck pain Patient reports a couple week history of neck pain.  She has been applying a topical analgesic to the area with some improvement but no total resolution.  Denies any upper extremity weakness or numbness and tingling.  4.  Memory concern Patient reports memory concerns over the last month.  She recently was told by her son that he was concerned about her memory because she thought it was Saturday when in fact it was Sunday.  She denies getting lost or inability to manage finances.  She has not forgotten  any of her family members names.  She is independent and have her ADLs.  No change in medication except for slight reduction of Lantus and discontinuation of lisinopril as above.   ROS: Per HPI  Allergies  Allergen Reactions  . Latex Anaphylaxis and Rash  . Ambien [Zolpidem] Other (See Comments)    Hallucinations  . Augmentin [Amoxicillin-Pot Clavulanate] Nausea And Vomiting  . Belsomra [Suvorexant] Other (See Comments)    Hallucinations  . Hydrocodone-Acetaminophen Other (See Comments)    hallucinations  . Hyoscyamine Sulfate Other (See Comments)  . Melatonin Other (See Comments)    Adverse reaction-"keeps me awake"  . Mobic [Meloxicam]   . Morphine Nausea And Vomiting    REACTION: hallucinations,GI upset  . Oxycodone Other (See Comments)    Hallucinations  . Tramadol     Unknown reaction  . Celebrex [Celecoxib] Itching and Rash  . Sulfa Antibiotics Rash   Past Medical History:  Diagnosis Date  . Acute cystitis   . Anxiety   . Depression   . Diabetes mellitus type II   . Diabetic retinopathy   . DM type 2 causing CKD stage 3 ([place])   . Fibromyalgia   . GERD (gastroesophageal reflux disease)   . Hyperlipidemia   . Hypertension   . IBS (irritable bowel syndrome)   . IBS (irritable bowel syndrome)   . Insomnia   . Lichen planus    vulvar  . Pancreatitis, gallstone   . Postmenopausal   . Radial scar of  breast 06/04/2016  . Urge incontinence of urine     Current Outpatient Medications:  .  acetaminophen (TYLENOL) 325 MG tablet, Take 325 mg by mouth as needed., Disp: , Rfl:  .  aspirin 81 MG tablet, Take 81 mg by mouth every other day. , Disp: , Rfl:  .  atorvastatin (LIPITOR) 20 MG tablet, Take 0.5 tablets (10 mg total) by mouth at bedtime., Disp: 90 tablet, Rfl: 3 .  Calcium Carb-Cholecalciferol (CALCIUM 600+D3) 600-200 MG-UNIT TABS, Take 1 tablet by mouth 2 (two) times daily.  , Disp: , Rfl:  .  cephALEXin (KEFLEX) 500 MG capsule, Take 1 capsule by mouth daily.,  Disp: , Rfl:  .  desonide (DESOWEN) 0.05 % ointment, Apply 1 application topically 2 (two) times daily. , Disp: , Rfl:  .  diclofenac sodium (VOLTAREN) 1 % GEL, Apply 4 g topically 4 (four) times daily., Disp: 100 g, Rfl: 3 .  Dulaglutide (TRULICITY) 1.5 KD/9.8PJ SOPN, Inject 1.5 mg into the skin once a week., Disp: 12 pen, Rfl: 3 .  escitalopram (LEXAPRO) 20 MG tablet, Take 1 tablet (20 mg total) by mouth daily., Disp: 90 tablet, Rfl: 2 .  esomeprazole (NEXIUM) 20 MG capsule, Take 1 capsule (20 mg total) by mouth daily at 12 noon., Disp: 90 capsule, Rfl: 1 .  estradiol (ESTRACE) 0.1 MG/GM vaginal cream, Place 2 g vaginally every Monday, Wednesday, and Friday. , Disp: , Rfl:  .  fluticasone (FLONASE) 50 MCG/ACT nasal spray, Place 2 sprays into both nostrils daily., Disp: 16 g, Rfl: 6 .  glucose blood (FREESTYLE LITE) test strip, Use qid to check blood sugar. Dx E11.9, Disp: 200 each, Rfl: 11 .  insulin aspart (NOVOLOG FLEXPEN) 100 UNIT/ML FlexPen, BG less than 80 - no insulin; 80 to 120 - 3 units; 121 to 150 - 4 units; 151 to 200 - 5 units; 201 to 250 - 6 units; 250 or above - 7 units, Disp: 30 mL, Rfl: 3 .  Insulin Glargine (LANTUS SOLOSTAR) 100 UNIT/ML Solostar Pen, Inject 27 Units into the skin every morning., Disp: 45 mL, Rfl: 3 .  Insulin Pen Needle 31G X 5 MM MISC, 1 Device by Does not apply route 4 (four) times daily., Disp: 300 each, Rfl: 4 .  mirabegron ER (MYRBETRIQ) 50 MG TB24 tablet, Take 50 mg by mouth daily., Disp: , Rfl:  .  Multiple Vitamin (MULTIVITAMIN) tablet, Take 1 tablet by mouth daily. , Disp: , Rfl:  .  nystatin (NYSTATIN) powder, APPLY UNDER BILATERAL BREASTS AND GROIN TWICE A DAY, Disp: 60 g, Rfl: 3 .  nystatin-triamcinolone ointment (MYCOLOG), APPLY TOPICALLY 2 (TWO) TIMES DAILY., Disp: 60 g, Rfl: 3 .  ondansetron (ZOFRAN) 4 MG tablet, Take 1 tablet (4 mg total) by mouth every 8 (eight) hours as needed for nausea or vomiting., Disp: 20 tablet, Rfl: 0 .  Polyethyl  Glycol-Propyl Glycol 0.4-0.3 % SOLN, Apply 1 drop to eye daily as needed (for dry eye relief). , Disp: , Rfl:  .  polyethylene glycol (MIRALAX / GLYCOLAX) packet, Take 17 g by mouth daily as needed. , Disp: , Rfl:  .  Probiotic Product (PROBIOTIC ADVANCED PO), Take 1 capsule by mouth daily. , Disp: , Rfl:  .  traZODone (DESYREL) 150 MG tablet, Take 0.5 tablets (75 mg total) by mouth at bedtime., Disp: 45 tablet, Rfl: 3 Social History   Socioeconomic History  . Marital status: Widowed    Spouse name: Not on file  . Number of children: 3  .  Years of education: 73  . Highest education level: Some college, no degree  Occupational History  . Occupation: retired    Fish farm manager: RETIRED    Comment: bookeeping  Social Needs  . Financial resource strain: Not hard at all  . Food insecurity:    Worry: Never true    Inability: Never true  . Transportation needs:    Medical: No    Non-medical: No  Tobacco Use  . Smoking status: Never Smoker  . Smokeless tobacco: Never Used  Substance and Sexual Activity  . Alcohol use: No  . Drug use: No  . Sexual activity: Not Currently  Lifestyle  . Physical activity:    Days per week: 3 days    Minutes per session: 30 min  . Stress: Only a little  Relationships  . Social connections:    Talks on phone: Not on file    Gets together: Not on file    Attends religious service: Not on file    Active member of club or organization: Not on file    Attends meetings of clubs or organizations: Not on file    Relationship status: Not on file  . Intimate partner violence:    Fear of current or ex partner: Not on file    Emotionally abused: Not on file    Physically abused: Not on file    Forced sexual activity: Not on file  Other Topics Concern  . Not on file  Social History Narrative   Widowed in 2010   Family History  Problem Relation Age of Onset  . Diabetes Mother   . Hypertension Mother   . Kidney disease Mother   . Heart disease Mother   .  Peripheral vascular disease Mother        amputation  . Cancer Father        prostate  . Diabetes Father   . Heart disease Father        CABG  . Hypertension Father   . Heart attack Father   . Depression Sister   . Hypertension Sister   . Neuropathy Sister     Objective: Office vital signs reviewed. BP 121/62   Pulse 79   Temp 97.9 F (36.6 C) (Oral)   Ht _0  (1.6 m)   Wt 158 lb (71.7 kg)   BMI 27.99 kg/m   Physical Examination:  General: Awake, alert, well nourished, No acute distress HEENT: Normal    Eyes: extraocular membranes intact, sclera white    Throat: moist mucus membranes Cardio: regular rate and rhythm, S1S2 heard, no murmurs appreciated Pulm: clear to auscultation bilaterally, no wheezes, rhonchi or rales; normal work of breathing on room air Extremities: warm, well perfused, No edema, cyanosis or clubbing; +2 pulses bilaterally MSK: Ambulating independently.  Active range of motion of cervical spine appears to be preserved. GI: Flat, soft, minimal tenderness in the periumbilical area.  No palpable masses.  Bowel sounds present throughout. Psych:AOx3; follows commands.  MMSE - Mini Mental State Exam 03/02/2018 01/19/2017 01/16/2016 12/30/2014  Orientation to time _1 Orientation to Place _2 Registration _3 Attention/ Calculation _4 Recall _5 Language- name 2 objects _6 Language- repeat _7 Language- follow 3 step command _8 Language- read & follow direction _9 Write a sentence _10 1  Copy design _0 Total score _1 Assessment/ Plan: 78 y.o. female   1. Type 2 diabetes mellitus with stage 3 chronic kidney disease, with long-term current use of insulin ([place]) I reviewed her A1c with endocrinology recently which was under excellent control at 6.7.  Continue current regimen.  She has reduced her Lantus dose to 26 units and I have changed that in the system and sent her refills to the [place]  per her request.  NovoLog also refilled. - insulin aspart (NOVOLOG FLEXPEN) 100 UNIT/ML FlexPen; BG less than 80 - no insulin; 80 to 120 - 3 units; 121 to 150 - 4 units; 151 to 200 - 5 units; 201 to 250 - 6 units; 250 or above - 7 units  Dispense: 30 mL; Refill: 3 - Insulin Glargine (LANTUS SOLOSTAR) 100 UNIT/ML Solostar Pen; Inject 26 Units into the skin every morning.  Dispense: 45 mL; Refill: 3  2. Hypertension associated with diabetes ([place]) Under excellent control without medication.  ACE inhibitor was discontinued because she had a hypotensive episode.  Agree with discontinuation.  We will continue to work with her in controlling her type 2 diabetes to avoid other complications. - CMP14+EGFR  3. Dyslipidemia associated with type 2 diabetes mellitus ([facility]) Continue atorvastatin.  Not due for fasting labs until summer - CMP14+EGFR  4. Memory difficulty Her Mini-Mental status exam was stable/slightly improved since last visit.  Do not suspect cognitive deficits at this time.  We discussed ways to improve organization and memory.  Handout was provided.  She will have a recheck of this at her annual wellness visit this summer  5. Gastroesophageal reflux disease, esophagitis presence not specified Under good control with Nexium.  This is been refilled. - esomeprazole (NEXIUM) 20 MG capsule; Take 1 capsule (20 mg total) by mouth daily at 12 noon.  Dispense: 90 capsule; Refill: 1  6. Neck pain Okay to continue topical therapies.  Physical therapy for home use discussed with patient and handout was provided  7. Irritable bowel syndrome with both constipation and diarrhea This will be her second treatment with Xifaxan, which she did have excellent results with initially.  We discussed that only 2 rounds of this medication is indicated for IBS D.  She voiced good understanding.  If she has persistent symptoms or recurrence, would recommend reevaluation with gastroenterology. - rifaximin (XIFAXAN)  550 MG TABS tablet; Take 1 tablet (550 mg total) by mouth 3 (three) times daily for 14 days.  Dispense: 42 tablet; Refill: 0   Orders Placed This Encounter  Procedures  . CMP14+EGFR   Meds ordered this encounter  Medications  . esomeprazole (NEXIUM) 20 MG capsule    Sig: Take 1 capsule (20 mg total) by mouth daily at 12 noon.    Dispense:  90 capsule    Refill:  1  . insulin aspart (NOVOLOG FLEXPEN) 100 UNIT/ML FlexPen    Sig: BG less than 80 - no insulin; 80 to 120 - 3 units; 121 to 150 - 4 units; 151 to 200 - 5 units; 201 to 250 - 6 units; 250 or above - 7 units    Dispense:  30 mL    Refill:  3  . DISCONTD: Insulin Glargine (LANTUS SOLOSTAR) 100 UNIT/ML Solostar Pen    Sig: Inject 26 Units into the skin every morning.    Dispense:  45 mL    Refill:  3  . rifaximin (XIFAXAN) 550 MG TABS  tablet    Sig: Take 1 tablet (550 mg total) by mouth 3 (three) times daily for 14 days.    Dispense:  42 tablet    Refill:  0  . Insulin Glargine (LANTUS SOLOSTAR) 100 UNIT/ML Solostar Pen    Sig: Inject 26 Units into the skin every morning.    Dispense:  45 mL    Refill:  [place], [place] [phone]

## 2018-12-06 LAB — CMP14+EGFR
A/G RATIO: 2.1 (ref 1.2–2.2)
ALK PHOS: 53 IU/L (ref 39–117)
ALT: 21 IU/L (ref 0–32)
AST: 22 IU/L (ref 0–40)
Albumin: 4 g/dL (ref 3.7–4.7)
BILIRUBIN TOTAL: 0.3 mg/dL (ref 0.0–1.2)
BUN/Creatinine Ratio: 19 (ref 12–28)
BUN: 24 mg/dL (ref 8–27)
CHLORIDE: 99 mmol/L (ref 96–106)
CO2: 24 mmol/L (ref 20–29)
Calcium: 9.4 mg/dL (ref 8.7–10.3)
Creatinine, Ser: 1.25 mg/dL — ABNORMAL HIGH (ref 0.57–1.00)
GFR calc Af Amer: 48 mL/min/{1.73_m2} — ABNORMAL LOW (ref >59)
GFR calc non Af Amer: 41 mL/min/{1.73_m2} — ABNORMAL LOW (ref >59)
Globulin, Total: 1.9 g/dL (ref 1.5–4.5)
Glucose: 257 mg/dL — ABNORMAL HIGH (ref 65–99)
POTASSIUM: 4.7 mmol/L (ref 3.5–5.2)
Sodium: 137 mmol/L (ref 134–144)
Total Protein: 5.9 g/dL — ABNORMAL LOW (ref 6.0–8.5)

## 2018-12-14 ENCOUNTER — Telehealth: Payer: Self-pay | Admitting: Family Medicine

## 2018-12-14 ENCOUNTER — Other Ambulatory Visit: Payer: Self-pay | Admitting: Family Medicine

## 2018-12-14 DIAGNOSIS — K219 Gastro-esophageal reflux disease without esophagitis: Secondary | ICD-10-CM

## 2018-12-14 DIAGNOSIS — F3289 Other specified depressive episodes: Secondary | ICD-10-CM

## 2018-12-14 MED ORDER — ESCITALOPRAM OXALATE 20 MG PO TABS: 20.0000 mg | ORAL_TABLET | Freq: Every day | ORAL | 0 refills | Status: DC

## 2018-12-14 NOTE — Telephone Encounter (Signed)
Refills sent to pharmacy and patient aware

## 2018-12-14 NOTE — Telephone Encounter (Signed)
Send a temporary supply to the local pharmacy of her choice. Then send the three months scrip to [facility]

## 2018-12-14 NOTE — Telephone Encounter (Signed)
escitalopram (LEXAPRO) 20 MG tablet never got sent to [facility] now patient is completely out. What does she need to do

## 2019-01-03 ENCOUNTER — Telehealth: Payer: Self-pay | Admitting: Family Medicine

## 2019-01-03 ENCOUNTER — Other Ambulatory Visit: Payer: Self-pay | Admitting: Family Medicine

## 2019-01-03 MED ORDER — DICLOFENAC SODIUM 1 % TD GEL: 4.0000 g | Freq: Four times a day (QID) | TRANSDERMAL | 3 refills | Status: DC

## 2019-01-03 NOTE — Telephone Encounter (Signed)
Patient aware.

## 2019-01-03 NOTE — Telephone Encounter (Signed)
Done

## 2019-02-03 ENCOUNTER — Other Ambulatory Visit: Payer: Self-pay | Admitting: Family Medicine

## 2019-02-03 DIAGNOSIS — F3289 Other specified depressive episodes: Secondary | ICD-10-CM

## 2019-02-12 ENCOUNTER — Other Ambulatory Visit: Payer: Self-pay | Admitting: Family Medicine

## 2019-02-12 ENCOUNTER — Telehealth: Payer: Self-pay | Admitting: Family Medicine

## 2019-02-12 DIAGNOSIS — F3289 Other specified depressive episodes: Secondary | ICD-10-CM

## 2019-02-12 MED ORDER — ESCITALOPRAM OXALATE 20 MG PO TABS: 20.0000 mg | ORAL_TABLET | Freq: Every day | ORAL | 1 refills | Status: DC

## 2019-02-21 DIAGNOSIS — N3946 Mixed incontinence: Secondary | ICD-10-CM | POA: Diagnosis not present

## 2019-02-21 DIAGNOSIS — N3020 Other chronic cystitis without hematuria: Secondary | ICD-10-CM | POA: Diagnosis not present

## 2019-03-01 ENCOUNTER — Telehealth: Payer: Self-pay | Admitting: Family Medicine

## 2019-03-01 DIAGNOSIS — F3289 Other specified depressive episodes: Secondary | ICD-10-CM

## 2019-03-01 MED ORDER — ESCITALOPRAM OXALATE 20 MG PO TABS: 20.0000 mg | ORAL_TABLET | Freq: Every day | ORAL | 3 refills | Status: DC

## 2019-03-01 NOTE — Telephone Encounter (Signed)
rx sent over and left message on pt VM stating rx sent over as requested and to call back with any further questions or concerns.

## 2019-03-01 NOTE — Telephone Encounter (Signed)
I see you sent rx on 02/12/19 for 90 with 1 refill. Is it ok to send the way pt wants it?

## 2019-03-01 NOTE — Telephone Encounter (Signed)
Yes

## 2019-03-15 ENCOUNTER — Telehealth: Payer: Self-pay | Admitting: Family Medicine

## 2019-03-26 ENCOUNTER — Encounter: Payer: Medicare Other | Admitting: *Deleted

## 2019-03-28 ENCOUNTER — Ambulatory Visit (INDEPENDENT_AMBULATORY_CARE_PROVIDER_SITE_OTHER): Payer: Medicare Other | Admitting: *Deleted

## 2019-03-28 ENCOUNTER — Other Ambulatory Visit: Payer: Self-pay

## 2019-03-28 VITALS — Ht 63.0 in | Wt 158.0 lb

## 2019-03-28 DIAGNOSIS — Z0000 Encounter for general adult medical examination without abnormal findings: Secondary | ICD-10-CM | POA: Diagnosis not present

## 2019-03-28 NOTE — Patient Instructions (Signed)
[name] , Thank you for taking time to come for your Medicare Wellness Visit. I appreciate your ongoing commitment to your health goals. Please review the following plan we discussed and let me know if I can assist you in the future.   These are the goals we discussed: Goals    . Exercise 3x per week (30 min per time)    . Weight (lb) < 180 lb (81.6 kg)       This is a list of the screening recommended for you and due dates:  Health Maintenance  Topic Date Due  . Mammogram  05/31/2018  . Hemoglobin A1C  02/26/2019  . Urine Protein Check  05/27/2019  . Flu Shot  05/26/2019  . Complete foot exam   08/29/2019  . Eye exam for diabetics  10/31/2019  . DEXA scan (bone density measurement)  05/29/2021  . Tetanus Vaccine  11/27/2024  . Pneumonia vaccines  Completed     Exercise for Older Adults Staying physically active is important as you age. The four types of exercises that are best for older adults are endurance, strength, balance, and flexibility. Contact your health care provider before you start any exercise routine. Ask your health care provider what activities are safe for you. What are the risks? Risks associated with exercising include:  Overdoing it. This may lead to sore muscles or fatigue.  Falls.  Injuries.  Dehydration. How to do these exercises Endurance exercises Endurance (aerobic) exercises raise your breathing rate and heart rate. Increasing your endurance helps you to do everyday tasks and stay healthy. By improving the health of your body system that includes your heart, lungs, and blood vessels (circulatory system), you may also delay or prevent diseases such as heart disease, diabetes, and bone loss (osteoporosis). Types of endurance exercises include:  Sports.  Indoor activities, such as using gym equipment, doing water aerobics, or dancing.  Outdoor activities, such as biking or jogging.  Tasks around the house, such as gardening, yard work, and  heavy household chores like cleaning.  Walking, such as hiking or walking around your neighborhood. When doing endurance exercises, make sure you:  Are aware of your surroundings.  Use safety equipment as directed.  Dress in layers when exercising outdoors.  Drink plenty of water to stay well hydrated. Build up endurance slowly. Start with 10 minutes at a time, and gradually build up to doing 30 minutes at a time. Unless your health care provider gave you different instructions, aim to exercise for a total of 150 minutes a week. Spread out that time so you are working on endurance on 3 or more days a week. Strength exercises Lifting, pulling, or pushing weights helps to strengthen muscles. Having stronger muscles makes it easier to do everyday activities, such as getting up from a chair, climbing stairs, carrying groceries, and playing with grandchildren. Strength exercises include arm and leg exercises that may be done:  With weights.  Without weights (using your own body weight).  With a resistance band. When doing strength exercises:  Move smoothly and steadily. Do not suddenly thrust or jerk the weights, the resistance band, or your body.  Start with no weights or with light weights, and gradually add more weight over time. Eventually, aim to use weights that are hard or very hard for you to lift. This means that you are able to do 8 repetitions with the weight, and the last few repetitions are very challenging.  Lift or push weights into position  for 3 seconds, hold the position for 1 second, and then take 3 seconds to return to your starting position.  Breathe out (exhale) during difficult movements, like lifting or pushing weights. Breathe in (inhale) to relax your muscles before the next repetition.  Consider alternating arms or legs, especially when you first start strength exercises.  Expect some slight muscle soreness after each session. Do strength exercises on 2 or more  days a week, for 30 minutes at a time. Avoid exercising the same muscle groups two days in a row. For example, if you work on your leg muscles one day, work on your arm muscles the next day. When you can do two sets of 10-15 repetitions with a certain weight, increase the amount of weight. Balance Balance exercises can help to prevent falls. Balance exercises include:  Standing on one foot.  Heel-to-toe walk.  Balance walk.  Tai chi. Make sure you have something sturdy to hold onto while doing balance exercises, such as a sturdy chair. As your balance improves, challenge yourself by holding onto the chair with one hand instead of two, and then with no hands. Trying exercises with your eyes closed also challenges your balance, but be sure to have a sturdy surface (like a countertop) close by in case you need it. Do balance exercises as often as you want, or as often as directed by your health care provider. Strength exercises for the lower body also help to improve balance. Flexibility Flexibility exercises improve how far you can bend, straighten, move, or rotate parts of your body (range of motion). These exercises also help you to do everyday activities such as getting dressed or reaching for objects. Flexibility exercises include stretching different parts of the body, and they may be done in a standing or seated position or on the floor. When stretching, make sure you:  Keep a slight bend in your arms and legs. Avoid completely straightening ("locking") your joints.  Do not stretch so far that you feel pain. You should feel a mild stretching feeling. You may try stretching farther as you become more flexible over time.  Relax and breathe between stretches.  Hold onto something sturdy for balance as needed. Hold each stretch for 10-30 seconds. Repeat each stretch 3-5 times. General safety tips  Exercise in well-lit areas.  Do not hold your breath during exercises or stretches.  Warm  up before exercising, and cool down after exercising. This can help prevent injury.  Drink plenty of water during exercise or any activity that makes you sweat.  Use smooth, steady movements. Do not use sudden, jerking movements, especially when lifting weights or doing flexibility exercises.  If you are not sure if an exercise is safe for you, or you are not sure how to do an exercise, talk with your health care provider. This is especially important if you have had surgery on muscles, bones, or joints (orthopedic surgery). Where to find more information You can find more information about exercise for older adults from:  Your local health department, fitness center, or community center. These facilities may have programs for aging adults.  [employer] on Aging: http://kim-miller.com/  National Council on Aging: www.ncoa.org Summary  Staying physically active is important as you age.  Make sure to contact your health care provider before you start any exercise routine. Ask your health care provider what activities are safe for you.  Doing endurance, strength, balance, and flexibility exercises can help to delay or prevent certain diseases, such  as heart disease, diabetes, and bone loss (osteoporosis). This information is not intended to replace advice given to you by your health care provider. Make sure you discuss any questions you have with your health care provider. Document Released: 03/02/2017 Document Revised: 03/02/2017 Document Reviewed: 03/02/2017 Elsevier Interactive Patient Education  2019 [employer].

## 2019-03-28 NOTE — Progress Notes (Signed)
MEDICARE ANNUAL WELLNESS VISIT  03/28/2019  Telephone Visit Disclaimer This Medicare AWV was conducted by telephone due to national recommendations for restrictions regarding the COVID-19 Pandemic (e.g. social distancing).  I verified, using two identifiers, that I am speaking with [name] or their authorized healthcare agent. I discussed the limitations, risks, security, and privacy concerns of performing an evaluation and management service by telephone and the potential availability of an in-person appointment in the future. The patient expressed understanding and agreed to proceed.   Subjective:  [name] is a 78 y.o. female patient of [name], DO who had a Medicare Annual Wellness Visit today via telephone. [name] is Retired and lives alone. she has 3 children. she reports that she is socially active and does interact with friends/family regularly. she is not physically active and enjoys gardening.  Patient Care Team: [name], DO as PCP - General (Family Medicine) Kendell Bane, MD (Surgery) [place], Laurice Record, MD (Inactive) as Consulting Physician (Orthopedic Surgery) Teena Irani, MD (Inactive) as Consulting Physician (Gastroenterology) Paula Compton, MD as Consulting Physician (Obstetrics and Gynecology) Rolm Bookbinder, MD as Consulting Physician (Dermatology) Bjorn Loser, MD as Consulting Physician (Urology) Minus Breeding, MD as Consulting Physician (Cardiology) Steffanie Rainwater, DPM as Consulting Physician (Podiatry) Raeanne Gathers, AUD as Consulting Physician (Audiology) Susa Day, MD as Consulting Physician (Orthopedic Surgery)  Advanced Directives 03/28/2019 08/02/2018 03/02/2018 07/12/2017 09/24/2016 09/21/2016 07/07/2016  Does Patient Have a Medical Advance Directive? Yes Yes Yes Yes Yes Yes Yes  Type of Paramedic of [place];Living will [place];Living will Living will;Healthcare Power of [place];Living will - - -  Does patient want to make changes to medical advance directive? Yes (MAU/Ambulatory/Procedural Areas - Information given) No - Patient declined No - Patient declined No - Patient declined No - Patient declined Yes (Inpatient - patient defers changing a medical advance directive at this time) -  Copy of [place] in Chart? No - copy requested No - copy requested No - copy requested No - copy requested - - -  Would patient like information on creating a medical advance directive? - - No - Patient declined - - - [facility] Utilization Over the Past 12 Months: # of hospitalizations or ER visits: 0 # of surgeries: 0  Review of Systems    Patient reports that her overall health is unchanged compared to last year.  Patient Reported Readings (BP, Pulse, CBG, Weight, etc) CBG-128  Current Medications & Allergies (verified) Allergies as of 03/28/2019      Reactions   Latex Anaphylaxis, Rash   Ambien [zolpidem] Other (See Comments)   Hallucinations   Augmentin [amoxicillin-pot Clavulanate] Nausea And Vomiting   Belsomra [suvorexant] Other (See Comments)   Hallucinations   Hydrocodone-acetaminophen Other (See Comments)   hallucinations   Hyoscyamine Sulfate Other (See Comments)   Melatonin Other (See Comments)   Adverse reaction-"keeps me awake"   Mobic [meloxicam]    Morphine Nausea And Vomiting   REACTION: hallucinations,GI upset   Oxycodone Other (See Comments)   Hallucinations   Tramadol    Unknown reaction   Celebrex [celecoxib] Itching, Rash   Sulfa Antibiotics Rash      Medication List       Accurate as of March 28, 2019  1:49 PM. If you have any questions, ask your nurse or doctor.        aspirin 81 MG tablet  Take 81 mg by mouth every other day.   atorvastatin 20 MG tablet Commonly known as:  LIPITOR Take 0.5 tablets (10 mg total) by mouth at bedtime.   Calcium 600+D3 600-200 MG-UNIT Tabs Generic  drug:  Calcium Carb-Cholecalciferol Take 1 tablet by mouth 2 (two) times daily.   desonide 0.05 % ointment Commonly known as:  DESOWEN Apply 1 application topically 2 (two) times daily.   diclofenac sodium 1 % Gel Commonly known as:  VOLTAREN Apply 4 g topically 4 (four) times daily.   Dulaglutide 1.5 MG/0.5ML Sopn Commonly known as:  Trulicity Inject 1.5 mg into the skin once a week.   escitalopram 20 MG tablet Commonly known as:  LEXAPRO Take 1 tablet (20 mg total) by mouth daily.   esomeprazole 20 MG capsule Commonly known as:  NexIUM Take 1 capsule (20 mg total) by mouth daily at 12 noon.   estradiol 0.1 MG/GM vaginal cream Commonly known as:  ESTRACE Place 2 g vaginally every Monday, Wednesday, and Friday.   fluticasone 50 MCG/ACT nasal spray Commonly known as:  FLONASE Place 2 sprays into both nostrils daily.   glucose blood test strip Commonly known as:  FREESTYLE LITE Use qid to check blood sugar. Dx E11.9   insulin aspart 100 UNIT/ML FlexPen Commonly known as:  NovoLOG FlexPen BG less than 80 - no insulin; 80 to 120 - 3 units; 121 to 150 - 4 units; 151 to 200 - 5 units; 201 to 250 - 6 units; 250 or above - 7 units   Insulin Glargine 100 UNIT/ML Solostar Pen Commonly known as:  Lantus SoloStar Inject 26 Units into the skin every morning.   Insulin Pen Needle 31G X 5 MM Misc 1 Device by Does not apply route 4 (four) times daily.   multivitamin tablet Take 1 tablet by mouth daily.   Myrbetriq 50 MG Tb24 tablet Generic drug:  mirabegron ER Take 50 mg by mouth daily.   nystatin powder Commonly known as:  nystatin APPLY UNDER BILATERAL BREASTS AND GROIN TWICE A DAY   nystatin-triamcinolone ointment Commonly known as:  MYCOLOG APPLY TOPICALLY 2 (TWO) TIMES DAILY.   ondansetron 4 MG tablet Commonly known as:  Zofran Take 1 tablet (4 mg total) by mouth every 8 (eight) hours as needed for nausea or vomiting.   Polyethyl Glycol-Propyl Glycol 0.4-0.3 %  Soln Apply 1 drop to eye daily as needed (for dry eye relief).   polyethylene glycol 17 g packet Commonly known as:  MIRALAX / GLYCOLAX Take 17 g by mouth daily as needed.   PROBIOTIC ADVANCED PO Take 1 capsule by mouth daily.   traZODone 150 MG tablet Commonly known as:  DESYREL Take 0.5 tablets (75 mg total) by mouth at bedtime.   Tylenol 325 MG tablet Generic drug:  acetaminophen Take 325 mg by mouth as needed.       History (reviewed): Past Medical History:  Diagnosis Date  . Acute cystitis   . Anxiety   . Depression   . Diabetes mellitus type II   . Diabetic retinopathy   . DM type 2 causing CKD stage 3 ([place])   . Fibromyalgia   . GERD (gastroesophageal reflux disease)   . Hyperlipidemia   . Hypertension   . IBS (irritable bowel syndrome)   . IBS (irritable bowel syndrome)   . Insomnia   . Lichen planus    vulvar  . Pancreatitis, gallstone   . Postmenopausal   . Radial scar of breast 06/04/2016  .  Urge incontinence of urine    Past Surgical History:  Procedure Laterality Date  . ANKLE SURGERY  02/2004  . BREAST LUMPECTOMY WITH RADIOACTIVE SEED LOCALIZATION Left 01/22/2016   Procedure: LEFT BREAST LUMPECTOMY WITH RADIOACTIVE SEED LOCALIZATION;  Surgeon: Autumn Messing III, MD;  Location: [facility];  Service: General;  Laterality: Left;  . CATARACT EXTRACTION W/ INTRAOCULAR LENS IMPLANT  09/2003, 12/2003  . CHOLECYSTECTOMY  2004  . EYE SURGERY     laser treatments  . JOINT REPLACEMENT    . TOTAL KNEE ARTHROPLASTY     Left  . TRIGGER FINGER RELEASE Left 01/30/2002  . TUBAL LIGATION  1970   Family History  Problem Relation Age of Onset  . Diabetes Mother   . Hypertension Mother   . Kidney disease Mother   . Heart disease Mother   . Peripheral vascular disease Mother        amputation  . Cancer Father        prostate  . Diabetes Father   . Heart disease Father        CABG  . Hypertension Father   . Heart attack Father   . Depression  Sister   . Hypertension Sister   . Neuropathy Sister    Social History   Socioeconomic History  . Marital status: Widowed    Spouse name: Not on file  . Number of children: 3  . Years of education: 7  . Highest education level: Some college, no degree  Occupational History  . Occupation: retired    Fish farm manager: RETIRED    Comment: bookeeping  Social Needs  . Financial resource strain: Not hard at all  . Food insecurity:    Worry: Never true    Inability: Never true  . Transportation needs:    Medical: No    Non-medical: No  Tobacco Use  . Smoking status: Never Smoker  . Smokeless tobacco: Never Used  Substance and Sexual Activity  . Alcohol use: No  . Drug use: No  . Sexual activity: Not Currently  Lifestyle  . Physical activity:    Days per week: 3 days    Minutes per session: 30 min  . Stress: Only a little  Relationships  . Social connections:    Talks on phone: More than three times a week    Gets together: Once a week    Attends religious service: Never    Active member of club or organization: No    Attends meetings of clubs or organizations: Never    Relationship status: Widowed  Other Topics Concern  . Not on file  Social History Narrative   Widowed in 2010    Activities of Daily Living In your present state of health, do you have any difficulty performing the following activities: 03/28/2019  Hearing? N  Vision? N  Difficulty concentrating or making decisions? N  Walking or climbing stairs? N  Dressing or bathing? N  Doing errands, shopping? N  Preparing Food and eating ? N  Using the Toilet? N  In the past six months, have you accidently leaked urine? N  Do you have problems with loss of bowel control? N  Managing your Medications? N  Managing your Finances? N  Housekeeping or managing your Housekeeping? N  Some recent data might be hidden    Patient Literacy How often do you need to have someone help you when you read instructions,  pamphlets, or other written materials from your doctor or pharmacy?: 1 -  Never What is the last grade level you completed in school?: Some College  Exercise Exercise limited by: None identified  Diet Patient reports consuming 3 meals a day and 2 snack(s) a day Patient reports that her primary diet is: Regular Patient reports that she does have regular access to food.   Depression Screen PHQ 2/9 Scores 03/28/2019 12/05/2018 12/05/2018 08/28/2018 06/21/2018 05/26/2018 05/03/2018  PHQ - 2 Score 0 2 2 1 2  0 2  PHQ- 9 Score - 4 - - 7 4 5      Fall Risk Fall Risk  03/28/2019 12/05/2018 08/28/2018 06/21/2018 05/26/2018  Falls in the past year? 0 0 0 No No  Risk for fall due to : - - - - -  Risk for fall due to: Comment - - - - -     Objective:  [name] seemed alert and oriented and she participated appropriately during our telephone visit.  Blood Pressure Weight BMI  BP Readings from Last 3 Encounters:  12/05/18 121/62  08/28/18 118/62  08/02/18 (!) 131/56   Wt Readings from Last 3 Encounters:  03/28/19 158 lb (71.7 kg)  12/05/18 158 lb (71.7 kg)  08/28/18 162 lb (73.5 kg)   BMI Readings from Last 1 Encounters:  03/28/19 27.99 kg/m    *Unable to obtain current vital signs, weight, and BMI due to telephone visit type  Hearing/Vision  . Keniesha did not seem to have difficulty with hearing/understanding during the telephone conversation . Reports that she has had a formal eye exam by an eye care professional within the past year . Reports that she has not had a formal hearing evaluation within the past year *Unable to fully assess hearing and vision during telephone visit type  Cognitive Function: 6CIT Screen 03/28/2019  What Year? 0 points  What month? 0 points  What time? 0 points  Count back from 20 0 points  Months in reverse 0 points  Repeat phrase 0 points  Total Score 0    Normal Cognitive Function Screening: Yes (Normal:0-7, Significant for Dysfunction: >8)  Immunization  & Health Maintenance Record Immunization History  Administered Date(s) Administered  . Influenza, High Dose Seasonal PF 07/19/2016, 10/13/2017, 08/28/2018  . Influenza,inj,Quad PF,6+ Mos 07/30/2013, 07/24/2014, 08/04/2015  . Pneumococcal Conjugate-13 11/27/2014  . Pneumococcal Polysaccharide-23 10/22/2009  . Td 10/25/2004  . Tdap 11/27/2014  . Zoster 04/06/2012    Health Maintenance  Topic Date Due  . MAMMOGRAM  05/31/2018  . HEMOGLOBIN A1C  02/26/2019  . URINE MICROALBUMIN  05/27/2019  . INFLUENZA VACCINE  05/26/2019  . FOOT EXAM  08/29/2019  . OPHTHALMOLOGY EXAM  10/31/2019  . DEXA SCAN  05/29/2021  . TETANUS/TDAP  11/27/2024  . PNA vac Low Risk Adult  Completed       Assessment  This is a routine wellness examination for [name].  Health Maintenance: Due or Overdue Health Maintenance Due  Topic Date Due  . MAMMOGRAM  05/31/2018  . HEMOGLOBIN A1C  02/26/2019  . URINE MICROALBUMIN  05/27/2019    [name] does not need a referral for Community Assistance: Care Management:   no Social Work:    no Prescription Assistance:  no Nutrition/Diabetes Education:  no   Plan:  Personalized Goals Goals Addressed            This Visit's Progress   . Exercise 3x per week (30 min per time)        Personalized Health Maintenance & Screening Recommendations  Screening  mammography Diabetes screening  Lung Cancer Screening Recommended: no (Low Dose CT Chest recommended if Age 15-80 years, 30 pack-year currently smoking OR have quit w/in past 15 years) Hepatitis C Screening recommended: no HIV Screening recommended: no  Advanced Directives: Written information was not prepared per patient's request.  Referrals & Orders No orders of the defined types were placed in this encounter.   Follow-up Plan . Follow-up with [name], DO as planned    I have personally reviewed and noted the following in the patient's chart:   . Medical and social  history . Use of alcohol, tobacco or illicit drugs  . Current medications and supplements . Functional ability and status . Nutritional status . Physical activity . Advanced directives . List of other physicians . Hospitalizations, surgeries, and ER visits in previous 12 months . Vitals . Screenings to include cognitive, depression, and falls . Referrals and appointments  In addition, I have reviewed and discussed with [name] certain preventive protocols, quality metrics, and best practice recommendations. A written personalized care plan for preventive services as well as general preventive health recommendations is available and can be mailed to the patient at her request.      [name]  03/28/2019

## 2019-04-30 ENCOUNTER — Encounter (INDEPENDENT_AMBULATORY_CARE_PROVIDER_SITE_OTHER): Payer: Medicare Other | Admitting: Ophthalmology

## 2019-04-30 ENCOUNTER — Other Ambulatory Visit: Payer: Self-pay

## 2019-04-30 DIAGNOSIS — I10 Essential (primary) hypertension: Secondary | ICD-10-CM

## 2019-04-30 DIAGNOSIS — E11319 Type 2 diabetes mellitus with unspecified diabetic retinopathy without macular edema: Secondary | ICD-10-CM

## 2019-04-30 DIAGNOSIS — E113393 Type 2 diabetes mellitus with moderate nonproliferative diabetic retinopathy without macular edema, bilateral: Secondary | ICD-10-CM | POA: Diagnosis not present

## 2019-04-30 DIAGNOSIS — H35033 Hypertensive retinopathy, bilateral: Secondary | ICD-10-CM

## 2019-04-30 DIAGNOSIS — H43813 Vitreous degeneration, bilateral: Secondary | ICD-10-CM

## 2019-05-16 DIAGNOSIS — Z794 Long term (current) use of insulin: Secondary | ICD-10-CM | POA: Diagnosis not present

## 2019-05-16 DIAGNOSIS — E1140 Type 2 diabetes mellitus with diabetic neuropathy, unspecified: Secondary | ICD-10-CM | POA: Diagnosis not present

## 2019-06-06 ENCOUNTER — Other Ambulatory Visit: Payer: Self-pay | Admitting: Family Medicine

## 2019-06-06 DIAGNOSIS — E785 Hyperlipidemia, unspecified: Secondary | ICD-10-CM

## 2019-06-06 DIAGNOSIS — K219 Gastro-esophageal reflux disease without esophagitis: Secondary | ICD-10-CM

## 2019-06-06 MED ORDER — TRAZODONE HCL 150 MG PO TABS: 75.0000 mg | ORAL_TABLET | Freq: Every day | ORAL | 0 refills | Status: DC

## 2019-06-06 MED ORDER — ATORVASTATIN CALCIUM 20 MG PO TABS: 10.0000 mg | ORAL_TABLET | Freq: Every day | ORAL | 0 refills | Status: DC

## 2019-06-06 MED ORDER — ESOMEPRAZOLE MAGNESIUM 20 MG PO CPDR: 20.0000 mg | DELAYED_RELEASE_CAPSULE | Freq: Every day | ORAL | 0 refills | Status: DC

## 2019-06-06 MED ORDER — TRULICITY 1.5 MG/0.5ML ~~LOC~~ SOAJ: 1.5000 mg | SUBCUTANEOUS | 0 refills | Status: DC

## 2019-06-06 NOTE — Telephone Encounter (Signed)
What is the name of the medication? Nexium 20 mg, Atorvastatin 20 mg, Trazodone 211 mg, Trulicity 1.5 mg Needs #15 with 3 refills  Have you contacted your pharmacy to request a refill? YES  Which pharmacy would you like this sent to? [facility]   Patient notified that their request is being sent to the clinical staff for review and that they should receive a call once it is complete. If they do not receive a call within 24 hours they can check with their pharmacy or our office.

## 2019-06-06 NOTE — Telephone Encounter (Signed)
OV 08/07/19 pt aware refill sent to pharmacy

## 2019-06-11 ENCOUNTER — Ambulatory Visit (INDEPENDENT_AMBULATORY_CARE_PROVIDER_SITE_OTHER): Payer: Medicare Other | Admitting: Family

## 2019-06-11 ENCOUNTER — Other Ambulatory Visit: Payer: Self-pay

## 2019-06-11 ENCOUNTER — Encounter: Payer: Self-pay | Admitting: Family

## 2019-06-11 VITALS — BP 137/66 | HR 78 | Temp 97.5°F | Ht 63.0 in | Wt 153.0 lb

## 2019-06-11 DIAGNOSIS — R42 Dizziness and giddiness: Secondary | ICD-10-CM | POA: Diagnosis not present

## 2019-06-11 DIAGNOSIS — R531 Weakness: Secondary | ICD-10-CM

## 2019-06-11 MED ORDER — MECLIZINE HCL 25 MG PO TABS: 25.0000 mg | ORAL_TABLET | Freq: Three times a day (TID) | ORAL | 0 refills | Status: DC | PRN

## 2019-06-11 NOTE — Patient Instructions (Signed)
Dizziness Dizziness is a common problem. It is a feeling of unsteadiness or light-headedness. You may feel like you are about to faint. Dizziness can lead to injury if you stumble or fall. Anyone can become dizzy, but dizziness is more common in older adults. This condition can be caused by a number of things, including medicines, dehydration, or illness. Follow these instructions at home: Eating and drinking  Drink enough fluid to keep your urine clear or pale yellow. This helps to keep you from becoming dehydrated. Try to drink more clear fluids, such as water.  Do not drink alcohol.  Limit your caffeine intake if told to do so by your health care provider. Check ingredients and nutrition facts to see if a food or beverage contains caffeine.  Limit your salt (sodium) intake if told to do so by your health care provider. Check ingredients and nutrition facts to see if a food or beverage contains sodium. Activity  Avoid making quick movements. ? Rise slowly from chairs and steady yourself until you feel okay. ? In the morning, first sit up on the side of the bed. When you feel okay, stand slowly while you hold onto something until you know that your balance is fine.  If you need to stand in one place for a long time, move your legs often. Tighten and relax the muscles in your legs while you are standing.  Do not drive or use heavy machinery if you feel dizzy.  Avoid bending down if you feel dizzy. Place items in your home so that they are easy for you to reach without leaning over. Lifestyle  Do not use any products that contain nicotine or tobacco, such as cigarettes and e-cigarettes. If you need help quitting, ask your health care provider.  Try to reduce your stress level by using methods such as yoga or meditation. Talk with your health care provider if you need help to manage your stress. General instructions  Watch your dizziness for any changes.  Take over-the-counter and  prescription medicines only as told by your health care provider. Talk with your health care provider if you think that your dizziness is caused by a medicine that you are taking.  Tell a friend or a family member that you are feeling dizzy. If he or she notices any changes in your behavior, have this person call your health care provider.  Keep all follow-up visits as told by your health care provider. This is important. Contact a health care provider if:  Your dizziness does not go away.  Your dizziness or light-headedness gets worse.  You feel nauseous.  You have reduced hearing.  You have new symptoms.  You are unsteady on your feet or you feel like the room is spinning. Get help right away if:  You vomit or have diarrhea and are unable to eat or drink anything.  You have problems talking, walking, swallowing, or using your arms, hands, or legs.  You feel generally weak.  You are not thinking clearly or you have trouble forming sentences. It may take a friend or family member to notice this.  You have chest pain, abdominal pain, shortness of breath, or sweating.  Your vision changes.  You have any bleeding.  You have a severe headache.  You have neck pain or a stiff neck.  You have a fever. These symptoms may represent a serious problem that is an emergency. Do not wait to see if the symptoms will go away. Get medical help   right away. Call your local emergency services (911 in the [place] Do not drive yourself to the hospital. Summary  Dizziness is a feeling of unsteadiness or light-headedness. This condition can be caused by a number of things, including medicines, dehydration, or illness.  Anyone can become dizzy, but dizziness is more common in older adults.  Drink enough fluid to keep your urine clear or pale yellow. Do not drink alcohol.  Avoid making quick movements if you feel dizzy. Monitor your dizziness for any changes. This information is not intended to  replace advice given to you by your health care provider. Make sure you discuss any questions you have with your health care provider. Document Released: 04/06/2001 Document Revised: 10/14/2017 Document Reviewed: 11/13/2016 Elsevier Patient Education  2020 [employer].

## 2019-06-11 NOTE — Progress Notes (Signed)
Subjective:    Patient ID: [name], female    DOB: Mar 21, 1941, 78 y.o.   MRN: [id number]  Chief Complaint  Patient presents with  . dizziness with weakness  . blood pressure problems   Pt presents to the office today with new onset of dizziness and weakness that started last Wednesday. She states she was outside working with her flowers and felt dizzy. She states since then her dizziness is constant and unchanged. She reports weakness, insomnia, and nausea.  Dizziness This is a new problem. The current episode started in the past 7 days. The problem occurs intermittently. The problem has been waxing and waning. Associated symptoms include nausea, vertigo and weakness. Pertinent negatives include no change in bowel habit, chills, congestion, coughing, fatigue, fever, headaches, sore throat, urinary symptoms, visual change or vomiting. Associated symptoms comments: Insomnia . The symptoms are aggravated by bending. She has tried rest for the symptoms. The treatment provided mild relief.  Diabetes She presents for her follow-up diabetic visit. She has type 2 diabetes mellitus. Hypoglycemia symptoms include dizziness. Pertinent negatives for hypoglycemia include no headaches. Associated symptoms include weakness. Pertinent negatives for diabetes include no fatigue and no visual change. Pertinent negatives for diabetic complications include no CVA, heart disease or nephropathy. Risk factors for coronary artery disease include dyslipidemia, diabetes mellitus and post-menopausal. Her overall blood glucose range is 130-140 mg/dl.      Review of Systems  Constitutional: Negative for chills, fatigue and fever.  HENT: Negative for congestion and sore throat.   Respiratory: Negative for cough.   Gastrointestinal: Positive for nausea. Negative for change in bowel habit and vomiting.  Neurological: Positive for dizziness, vertigo and weakness. Negative for headaches.  All other systems reviewed  and are negative.      Objective:   Physical Exam Vitals signs reviewed.  Constitutional:      General: She is not in acute distress.    Appearance: She is well-developed.  HENT:     Head: Normocephalic and atraumatic.     Right Ear: External ear normal.  Eyes:     Pupils: Pupils are equal, round, and reactive to light.  Neck:     Musculoskeletal: Normal range of motion and neck supple.     Thyroid: No thyromegaly.  Cardiovascular:     Rate and Rhythm: Normal rate. Rhythm irregular.     Heart sounds: Normal heart sounds. No murmur.  Pulmonary:     Effort: Pulmonary effort is normal. No respiratory distress.     Breath sounds: Normal breath sounds. No wheezing.  Abdominal:     General: Bowel sounds are normal. There is no distension.     Palpations: Abdomen is soft.     Tenderness: There is no abdominal tenderness.  Musculoskeletal: Normal range of motion.        General: No tenderness.  Skin:    General: Skin is warm and dry.  Neurological:     Mental Status: She is alert and oriented to person, place, and time.     Cranial Nerves: No cranial nerve deficit.     Deep Tendon Reflexes: Reflexes are normal and symmetric.  Psychiatric:        Behavior: Behavior normal.        Thought Content: Thought content normal.        Judgment: Judgment normal.       BP 137/66   Pulse 78   Temp (!) 97.5 F (36.4 C) (Oral)   Ht 5' 3"  (  1.6 m)   Wt 153 lb (69.4 kg)   BMI 27.10 kg/m      Assessment & Plan:  [name] comes in today with chief complaint of dizziness with weakness and blood pressure problems   Diagnosis and orders addressed:  1. Dizziness - EKG 12-Lead - CMP14+EGFR - Anemia Profile B - TSH - meclizine (ANTIVERT) 25 MG tablet; Take 1 tablet (25 mg total) by mouth 3 (three) times daily as needed for dizziness.  Dispense: 30 tablet; Refill: 0  2. Weakness - EKG 12-Lead - CMP14+EGFR - Anemia Profile B - TSH   Labs pending Fall preventions  discussed Avoid fast position changes or bending EKG stable Antivert as needed Follow up with PCP in 2 weeks, if dizziness continues may need referral   Evelina Dun, FNP

## 2019-06-12 LAB — ANEMIA PROFILE B
Basophils Absolute: 0 10*3/uL (ref 0.0–0.2)
Basos: 1 %
EOS (ABSOLUTE): 0.1 10*3/uL (ref 0.0–0.4)
Eos: 2 %
Ferritin: 73 ng/mL (ref 15–150)
Folate: >20 ng/mL (ref >3.0)
Hematocrit: 40.5 % (ref 34.0–46.6)
Hemoglobin: 13.7 g/dL (ref 11.1–15.9)
Immature Grans (Abs): 0 10*3/uL (ref 0.0–0.1)
Immature Granulocytes: 0 %
Iron Saturation: 24 % (ref 15–55)
Iron: 82 ug/dL (ref 27–139)
Lymphocytes Absolute: 1.2 10*3/uL (ref 0.7–3.1)
Lymphs: 24 %
MCH: 32.2 pg (ref 26.6–33.0)
MCHC: 33.8 g/dL (ref 31.5–35.7)
MCV: 95 fL (ref 79–97)
Monocytes Absolute: 0.3 10*3/uL (ref 0.1–0.9)
Monocytes: 7 %
Neutrophils Absolute: 3.2 10*3/uL (ref 1.4–7.0)
Neutrophils: 66 %
Platelets: 151 10*3/uL (ref 150–450)
RBC: 4.26 x10E6/uL (ref 3.77–5.28)
RDW: 12.5 % (ref 11.7–15.4)
Retic Ct Pct: 1.2 % (ref 0.6–2.6)
Total Iron Binding Capacity: 335 ug/dL (ref 250–450)
UIBC: 253 ug/dL (ref 118–369)
Vitamin B-12: 1464 pg/mL — ABNORMAL HIGH (ref 232–1245)
WBC: 4.9 10*3/uL (ref 3.4–10.8)

## 2019-06-12 LAB — CMP14+EGFR
ALT: 20 IU/L (ref 0–32)
AST: 23 IU/L (ref 0–40)
Albumin/Globulin Ratio: 1.7 (ref 1.2–2.2)
Albumin: 4.2 g/dL (ref 3.7–4.7)
Alkaline Phosphatase: 52 IU/L (ref 39–117)
BUN/Creatinine Ratio: 16 (ref 12–28)
BUN: 19 mg/dL (ref 8–27)
Bilirubin Total: 0.3 mg/dL (ref 0.0–1.2)
CO2: 30 mmol/L — ABNORMAL HIGH (ref 20–29)
Calcium: 9.6 mg/dL (ref 8.7–10.3)
Chloride: 96 mmol/L (ref 96–106)
Creatinine, Ser: 1.18 mg/dL — ABNORMAL HIGH (ref 0.57–1.00)
GFR calc Af Amer: 51 mL/min/{1.73_m2} — ABNORMAL LOW (ref >59)
GFR calc non Af Amer: 44 mL/min/{1.73_m2} — ABNORMAL LOW (ref >59)
Globulin, Total: 2.5 g/dL (ref 1.5–4.5)
Glucose: 174 mg/dL — ABNORMAL HIGH (ref 65–99)
Potassium: 4.6 mmol/L (ref 3.5–5.2)
Sodium: 139 mmol/L (ref 134–144)
Total Protein: 6.7 g/dL (ref 6.0–8.5)

## 2019-06-12 LAB — TSH: TSH: 1.62 u[IU]/mL (ref 0.450–4.500)

## 2019-06-27 ENCOUNTER — Other Ambulatory Visit: Payer: Self-pay | Admitting: Family Medicine

## 2019-06-27 MED ORDER — DESONIDE 0.05 % EX OINT: 1.0000 "application " | TOPICAL_OINTMENT | Freq: Two times a day (BID) | CUTANEOUS | 0 refills | Status: DC

## 2019-07-06 ENCOUNTER — Other Ambulatory Visit: Payer: Self-pay

## 2019-07-09 ENCOUNTER — Encounter: Payer: Self-pay | Admitting: Family Medicine

## 2019-07-09 ENCOUNTER — Ambulatory Visit (INDEPENDENT_AMBULATORY_CARE_PROVIDER_SITE_OTHER): Payer: Medicare Other | Admitting: Family Medicine

## 2019-07-09 ENCOUNTER — Other Ambulatory Visit: Payer: Self-pay

## 2019-07-09 VITALS — BP 136/65 | HR 75 | Temp 97.8°F | Resp 18 | Ht 63.0 in | Wt 156.0 lb

## 2019-07-09 DIAGNOSIS — Z23 Encounter for immunization: Secondary | ICD-10-CM | POA: Diagnosis not present

## 2019-07-09 DIAGNOSIS — I951 Orthostatic hypotension: Secondary | ICD-10-CM

## 2019-07-09 DIAGNOSIS — R42 Dizziness and giddiness: Secondary | ICD-10-CM | POA: Diagnosis not present

## 2019-07-09 NOTE — Progress Notes (Signed)
Subjective: CC: recheck dizziness PCP: [name] Norlander, DO PB:[id number] [name] is a 78 y.o. female presenting to clinic today for:  1. Dizziness Patient was seen on 06/11/2019 for new onset dizziness and weakness that started a few days prior.  She had labs obtained an EKG performed.  Antivert was prescribed. Labs during that visit showed stable CKD 3.  CBC and anemia panel was within normal range except for elevated B12.  Thyroid panel was normal.  Sugar was noted to be elevated to 174.  Since her last visit she notes that the dizziness has gotten quite a bit better.  She brings me her blood pressure log since onset of symptoms.  Apparently on the day of symptoms she had systolic blood pressures into the 80s and 0000000 over diastolics of 0000000.  She is had one other episode with similar blood pressure readings, during which time she was dizzy.  She is unsure if she has been hydrating adequately.  Not currently using any compression hose.  She denies any vertiginous symptoms.  No nausea, vomiting, room spinning.  No falls.  She did feel that balance was off during dizzy episodes.  However this ultimately resolved.  Denies any unilateral weakness, sensation changes, slurred speech.    ROS: Per HPI  Allergies  Allergen Reactions  . Latex Anaphylaxis and Rash  . Ambien [Zolpidem] Other (See Comments)    Hallucinations  . Augmentin [Amoxicillin-Pot Clavulanate] Nausea And Vomiting  . Belsomra [Suvorexant] Other (See Comments)    Hallucinations  . Hydrocodone-Acetaminophen Other (See Comments)    hallucinations  . Hyoscyamine Sulfate Other (See Comments)  . Melatonin Other (See Comments)    Adverse reaction-"keeps me awake"  . Mobic [Meloxicam]   . Morphine Nausea And Vomiting    REACTION: hallucinations,GI upset  . Oxycodone Other (See Comments)    Hallucinations  . Tramadol     Unknown reaction  . Celebrex [Celecoxib] Itching and Rash  . Sulfa Antibiotics Rash   Past Medical  History:  Diagnosis Date  . Acute cystitis   . Anxiety   . Depression   . Diabetes mellitus type II   . Diabetic retinopathy   . DM type 2 causing CKD stage 3 ([place])   . Fibromyalgia   . GERD (gastroesophageal reflux disease)   . Hyperlipidemia   . Hypertension   . IBS (irritable bowel syndrome)   . IBS (irritable bowel syndrome)   . Insomnia   . Lichen planus    vulvar  . Pancreatitis, gallstone   . Postmenopausal   . Radial scar of breast 06/04/2016  . Urge incontinence of urine     Current Outpatient Medications:  .  acetaminophen (TYLENOL) 325 MG tablet, Take 325 mg by mouth as needed., Disp: , Rfl:  .  aspirin 81 MG tablet, Take 81 mg by mouth every other day. , Disp: , Rfl:  .  atorvastatin (LIPITOR) 20 MG tablet, Take 0.5 tablets (10 mg total) by mouth at bedtime. Disregard 30 day supply, Disp: 45 tablet, Rfl: 0 .  Calcium Carb-Cholecalciferol (CALCIUM 600+D3) 600-200 MG-UNIT TABS, Take 1 tablet by mouth 2 (two) times daily.  , Disp: , Rfl:  .  desonide (DESOWEN) 0.05 % ointment, Apply 1 application topically 2 (two) times daily., Disp: 15 g, Rfl: 0 .  diclofenac sodium (VOLTAREN) 1 % GEL, Apply 4 g topically 4 (four) times daily., Disp: 400 g, Rfl: 3 .  Dulaglutide (TRULICITY) 1.5 0000000 SOPN, Inject 1.5 mg into the skin  once a week., Disp: 12 mL, Rfl: 0 .  escitalopram (LEXAPRO) 20 MG tablet, Take 1 tablet (20 mg total) by mouth daily., Disp: 90 tablet, Rfl: 3 .  esomeprazole (NEXIUM) 20 MG capsule, Take 1 capsule (20 mg total) by mouth daily at 12 noon. Disregard 30 day supply, Disp: 90 capsule, Rfl: 0 .  estradiol (ESTRACE) 0.1 MG/GM vaginal cream, Place 2 g vaginally every Monday, Wednesday, and Friday. , Disp: , Rfl:  .  fluticasone (FLONASE) 50 MCG/ACT nasal spray, Place 2 sprays into both nostrils daily., Disp: 16 g, Rfl: 6 .  glucose blood (FREESTYLE LITE) test strip, Use qid to check blood sugar. Dx E11.9, Disp: 200 each, Rfl: 11 .  insulin aspart (NOVOLOG  FLEXPEN) 100 UNIT/ML FlexPen, BG less than 80 - no insulin; 80 to 120 - 3 units; 121 to 150 - 4 units; 151 to 200 - 5 units; 201 to 250 - 6 units; 250 or above - 7 units, Disp: 30 mL, Rfl: 3 .  Insulin Glargine (LANTUS SOLOSTAR) 100 UNIT/ML Solostar Pen, Inject 26 Units into the skin every morning., Disp: 45 mL, Rfl: 3 .  Insulin Pen Needle 31G X 5 MM MISC, 1 Device by Does not apply route 4 (four) times daily., Disp: 300 each, Rfl: 4 .  meclizine (ANTIVERT) 25 MG tablet, Take 1 tablet (25 mg total) by mouth 3 (three) times daily as needed for dizziness., Disp: 30 tablet, Rfl: 0 .  mirabegron ER (MYRBETRIQ) 50 MG TB24 tablet, Take 50 mg by mouth daily., Disp: , Rfl:  .  Multiple Vitamin (MULTIVITAMIN) tablet, Take 1 tablet by mouth daily. , Disp: , Rfl:  .  nystatin (NYSTATIN) powder, APPLY UNDER BILATERAL BREASTS AND GROIN TWICE A DAY, Disp: 60 g, Rfl: 3 .  nystatin-triamcinolone ointment (MYCOLOG), APPLY TOPICALLY 2 (TWO) TIMES DAILY., Disp: 60 g, Rfl: 3 .  Polyethyl Glycol-Propyl Glycol 0.4-0.3 % SOLN, Apply 1 drop to eye daily as needed (for dry eye relief). , Disp: , Rfl:  .  polyethylene glycol (MIRALAX / GLYCOLAX) packet, Take 17 g by mouth daily as needed. , Disp: , Rfl:  .  Probiotic Product (PROBIOTIC ADVANCED PO), Take 1 capsule by mouth daily. , Disp: , Rfl:  .  traZODone (DESYREL) 150 MG tablet, Take 0.5 tablets (75 mg total) by mouth at bedtime., Disp: 45 tablet, Rfl: 0 Social History   Socioeconomic History  . Marital status: Widowed    Spouse name: Not on file  . Number of children: 3  . Years of education: 30  . Highest education level: Some college, no degree  Occupational History  . Occupation: retired    Fish farm manager: RETIRED    Comment: bookeeping  Social Needs  . Financial resource strain: Not hard at all  . Food insecurity    Worry: Never true    Inability: Never true  . Transportation needs    Medical: No    Non-medical: No  Tobacco Use  . Smoking status: Never  Smoker  . Smokeless tobacco: Never Used  Substance and Sexual Activity  . Alcohol use: No  . Drug use: No  . Sexual activity: Not Currently  Lifestyle  . Physical activity    Days per week: 3 days    Minutes per session: 30 min  . Stress: Only a little  Relationships  . Social connections    Talks on phone: More than three times a week    Gets together: Once a week    Attends  religious service: Never    Active member of club or organization: No    Attends meetings of clubs or organizations: Never    Relationship status: Widowed  . Intimate partner violence    Fear of current or ex partner: No    Emotionally abused: No    Physically abused: No    Forced sexual activity: No  Other Topics Concern  . Not on file  Social History Narrative   Widowed in 2010   Family History  Problem Relation Age of Onset  . Diabetes Mother   . Hypertension Mother   . Kidney disease Mother   . Heart disease Mother   . Peripheral vascular disease Mother        amputation  . Cancer Father        prostate  . Diabetes Father   . Heart disease Father        CABG  . Hypertension Father   . Heart attack Father   . Depression Sister   . Hypertension Sister   . Neuropathy Sister     Objective: Office vital signs reviewed. Temp 97.8 F (36.6 C)   Resp 18   Ht 5\' 3"  (1.6 m)   Wt 156 lb (70.8 kg)   SpO2 96%   BMI 27.63 kg/m   Physical Examination:  General: Awake, alert, well nourished, No acute distress HEENT: Normal.  Sclera white.  PERRLA.  EOMI. Cardio: regular rate and rhythm, S1S2 heard, no murmurs appreciated Pulm: clear to auscultation bilaterally, no wheezes, rhonchi or rales; normal work of breathing on room air MSK: normal gait and station Neuro: 5/5 UE and LE Strength and light touch sensation grossly intact, cranial 2 through 12 grossly intact.  No focal neurologic deficits  Orthostatic VS for the past 24 hrs:  BP- Lying Pulse- Lying BP- Sitting Pulse- Sitting BP-  Standing at 0 minutes Pulse- Standing at 0 minutes  07/09/19 1347 136/65 75 132/69 69 117/58 74    Assessment/ Plan: 78 y.o. female   1. Dizziness Improving.  I suspect orthostatic hypotension as etiology given markedly low blood pressure noted during symptoms.  We discussed ways to combat this including slow transition between positions, lower extremity compression hose, adequate hydration.  We discussed red flag signs and symptoms warranting further evaluation emergency department.  She voiced good understanding.  Follow-up PRN  2. Orthostatic hypotension  3. Need for immunization against influenza Administered during today's visit. - Flu Vaccine QUAD High Dose(Fluad)   No orders of the defined types were placed in this encounter.  No orders of the defined types were placed in this encounter.    [name] Norlander, DO [place] [phone]

## 2019-07-09 NOTE — Patient Instructions (Signed)
I think you are having something called orthostatic hypotension.  We talked about drinking adequate water and using compression hose.  If you develop any signs or symptoms concerning for stroke, go the the ER.   Orthostatic Hypotension Blood pressure is a measurement of how strongly, or weakly, your blood is pressing against the walls of your arteries. Orthostatic hypotension is a sudden drop in blood pressure that happens when you quickly change positions, such as when you get up from sitting or lying down. Arteries are blood vessels that carry blood from your heart throughout your body. When blood pressure is too low, you may not get enough blood to your brain or to the rest of your organs. This can cause weakness, light-headedness, rapid heartbeat, and fainting. This can last for just a few seconds or for up to a few minutes. Orthostatic hypotension is usually not a serious problem. However, if it happens frequently or gets worse, it may be a sign of something more serious. What are the causes? This condition may be caused by:  Sudden changes in posture, such as standing up quickly after you have been sitting or lying down.  Blood loss.  Loss of body fluids (dehydration).  Heart problems.  Hormone (endocrine) problems.  Pregnancy.  Severe infection.  Lack of certain nutrients.  Severe allergic reactions (anaphylaxis).  Certain medicines, such as blood pressure medicine or medicines that make the body lose excess fluids (diuretics). Sometimes, this condition can be caused by not taking medicine as directed, such as taking too much of a certain medicine. What increases the risk? The following factors may make you more likely to develop this condition:  Age. Risk increases as you get older.  Conditions that affect the heart or the central nervous system.  Taking certain medicines, such as blood pressure medicine or diuretics.  Being pregnant. What are the signs or symptoms?  Symptoms of this condition may include:  Weakness.  Light-headedness.  Dizziness.  Blurred vision.  Fatigue.  Rapid heartbeat.  Fainting, in severe cases. How is this diagnosed? This condition is diagnosed based on:  Your medical history.  Your symptoms.  Your blood pressure measurement. Your health care provider will check your blood pressure when you are: ? Lying down. ? Sitting. ? Standing. A blood pressure reading is recorded as two numbers, such as "120 over 80" (or 120/80). The first ("top") number is called the systolic pressure. It is a measure of the pressure in your arteries as your heart beats. The second ("bottom") number is called the diastolic pressure. It is a measure of the pressure in your arteries when your heart relaxes between beats. Blood pressure is measured in a unit called mm Hg. Healthy blood pressure for most adults is 120/80. If your blood pressure is below 90/60, you may be diagnosed with hypotension. Other information or tests that may be used to diagnose orthostatic hypotension include:  Your other vital signs, such as your heart rate and temperature.  Blood tests.  Tilt table test. For this test, you will be safely secured to a table that moves you from a lying position to an upright position. Your heart rhythm and blood pressure will be monitored during the test. How is this treated? This condition may be treated by:  Changing your diet. This may involve eating more salt (sodium) or drinking more water.  Taking medicines to raise your blood pressure.  Changing the dosage of certain medicines you are taking that might be lowering your blood  pressure.  Wearing compression stockings. These stockings help to prevent blood clots and reduce swelling in your legs. In some cases, you may need to go to the hospital for:  Fluid replacement. This means you will receive fluids through an IV.  Blood replacement. This means you will receive donated  blood through an IV (transfusion).  Treating an infection or heart problems, if this applies.  Monitoring. You may need to be monitored while medicines that you are taking wear off. Follow these instructions at home: Eating and drinking   Drink enough fluid to keep your urine pale yellow.  Eat a healthy diet, and follow instructions from your health care provider about eating or drinking restrictions. A healthy diet includes: ? Fresh fruits and vegetables. ? Whole grains. ? Lean meats. ? Low-fat dairy products.  Eat extra salt only as directed. Do not add extra salt to your diet unless your health care provider told you to do that.  Eat frequent, small meals.  Avoid standing up suddenly after eating. Medicines  Take over-the-counter and prescription medicines only as told by your health care provider. ? Follow instructions from your health care provider about changing the dosage of your current medicines, if this applies. ? Do not stop or adjust any of your medicines on your own. General instructions   Wear compression stockings as told by your health care provider.  Get up slowly from lying down or sitting positions. This gives your blood pressure a chance to adjust.  Avoid hot showers and excessive heat as directed by your health care provider.  Return to your normal activities as told by your health care provider. Ask your health care provider what activities are safe for you.  Do not use any products that contain nicotine or tobacco, such as cigarettes, e-cigarettes, and chewing tobacco. If you need help quitting, ask your health care provider.  Keep all follow-up visits as told by your health care provider. This is important. Contact a health care provider if you:  Vomit.  Have diarrhea.  Have a fever for more than 2-3 days.  Feel more thirsty than usual.  Feel weak and tired. Get help right away if you:  Have chest pain.  Have a fast or irregular  heartbeat.  Develop numbness in any part of your body.  Cannot move your arms or your legs.  Have trouble speaking.  Become sweaty or feel light-headed.  Faint.  Feel short of breath.  Have trouble staying awake.  Feel confused. Summary  Orthostatic hypotension is a sudden drop in blood pressure that happens when you quickly change positions.  Orthostatic hypotension is usually not a serious problem.  It is diagnosed by having your blood pressure taken lying down, sitting, and then standing.  It may be treated by changing your diet or adjusting your medicines. This information is not intended to replace advice given to you by your health care provider. Make sure you discuss any questions you have with your health care provider. Document Released: 10/01/2002 Document Revised: 04/06/2018 Document Reviewed: 04/06/2018 Elsevier Patient Education  2020 [employer].

## 2019-07-17 ENCOUNTER — Telehealth (HOSPITAL_COMMUNITY): Payer: Self-pay | Admitting: *Deleted

## 2019-07-19 DIAGNOSIS — M79676 Pain in unspecified toe(s): Secondary | ICD-10-CM | POA: Diagnosis not present

## 2019-07-19 DIAGNOSIS — B351 Tinea unguium: Secondary | ICD-10-CM | POA: Diagnosis not present

## 2019-07-19 DIAGNOSIS — L84 Corns and callosities: Secondary | ICD-10-CM | POA: Diagnosis not present

## 2019-07-19 DIAGNOSIS — E1142 Type 2 diabetes mellitus with diabetic polyneuropathy: Secondary | ICD-10-CM | POA: Diagnosis not present

## 2019-07-26 ENCOUNTER — Other Ambulatory Visit (HOSPITAL_COMMUNITY): Payer: Self-pay | Admitting: Nurse Practitioner

## 2019-07-26 DIAGNOSIS — N6489 Other specified disorders of breast: Secondary | ICD-10-CM

## 2019-07-27 ENCOUNTER — Other Ambulatory Visit: Payer: Medicare Other

## 2019-07-27 ENCOUNTER — Other Ambulatory Visit: Payer: Self-pay

## 2019-07-27 ENCOUNTER — Encounter (HOSPITAL_COMMUNITY): Payer: Self-pay | Admitting: *Deleted

## 2019-07-27 ENCOUNTER — Inpatient Hospital Stay (HOSPITAL_COMMUNITY): Payer: Medicare Other | Attending: Hematology

## 2019-07-27 DIAGNOSIS — N6459 Other signs and symptoms in breast: Secondary | ICD-10-CM | POA: Insufficient documentation

## 2019-07-27 DIAGNOSIS — N6489 Other specified disorders of breast: Secondary | ICD-10-CM | POA: Insufficient documentation

## 2019-07-27 DIAGNOSIS — Z791 Long term (current) use of non-steroidal anti-inflammatories (NSAID): Secondary | ICD-10-CM | POA: Insufficient documentation

## 2019-07-27 DIAGNOSIS — K589 Irritable bowel syndrome without diarrhea: Secondary | ICD-10-CM | POA: Insufficient documentation

## 2019-07-27 DIAGNOSIS — I10 Essential (primary) hypertension: Secondary | ICD-10-CM | POA: Insufficient documentation

## 2019-07-27 DIAGNOSIS — Z794 Long term (current) use of insulin: Secondary | ICD-10-CM | POA: Insufficient documentation

## 2019-07-27 DIAGNOSIS — Z8249 Family history of ischemic heart disease and other diseases of the circulatory system: Secondary | ICD-10-CM | POA: Insufficient documentation

## 2019-07-27 DIAGNOSIS — E11319 Type 2 diabetes mellitus with unspecified diabetic retinopathy without macular edema: Secondary | ICD-10-CM | POA: Insufficient documentation

## 2019-07-27 DIAGNOSIS — G4700 Insomnia, unspecified: Secondary | ICD-10-CM | POA: Insufficient documentation

## 2019-07-27 DIAGNOSIS — N6099 Unspecified benign mammary dysplasia of unspecified breast: Secondary | ICD-10-CM | POA: Diagnosis not present

## 2019-07-27 DIAGNOSIS — Z8042 Family history of malignant neoplasm of prostate: Secondary | ICD-10-CM | POA: Insufficient documentation

## 2019-07-27 DIAGNOSIS — E785 Hyperlipidemia, unspecified: Secondary | ICD-10-CM | POA: Insufficient documentation

## 2019-07-27 DIAGNOSIS — Z79899 Other long term (current) drug therapy: Secondary | ICD-10-CM | POA: Insufficient documentation

## 2019-07-27 DIAGNOSIS — Z7982 Long term (current) use of aspirin: Secondary | ICD-10-CM | POA: Insufficient documentation

## 2019-07-27 DIAGNOSIS — Z833 Family history of diabetes mellitus: Secondary | ICD-10-CM | POA: Insufficient documentation

## 2019-07-27 DIAGNOSIS — F329 Major depressive disorder, single episode, unspecified: Secondary | ICD-10-CM | POA: Insufficient documentation

## 2019-07-27 DIAGNOSIS — F419 Anxiety disorder, unspecified: Secondary | ICD-10-CM | POA: Insufficient documentation

## 2019-08-03 ENCOUNTER — Other Ambulatory Visit: Payer: Self-pay

## 2019-08-03 ENCOUNTER — Inpatient Hospital Stay (HOSPITAL_BASED_OUTPATIENT_CLINIC_OR_DEPARTMENT_OTHER): Payer: Medicare Other | Admitting: Nurse Practitioner

## 2019-08-03 ENCOUNTER — Ambulatory Visit (HOSPITAL_COMMUNITY): Payer: Medicare Other | Admitting: Nurse Practitioner

## 2019-08-03 ENCOUNTER — Encounter (HOSPITAL_COMMUNITY): Payer: Self-pay | Admitting: Nurse Practitioner

## 2019-08-03 DIAGNOSIS — Z7982 Long term (current) use of aspirin: Secondary | ICD-10-CM | POA: Diagnosis not present

## 2019-08-03 DIAGNOSIS — E785 Hyperlipidemia, unspecified: Secondary | ICD-10-CM | POA: Diagnosis not present

## 2019-08-03 DIAGNOSIS — Z794 Long term (current) use of insulin: Secondary | ICD-10-CM | POA: Diagnosis not present

## 2019-08-03 DIAGNOSIS — I10 Essential (primary) hypertension: Secondary | ICD-10-CM | POA: Diagnosis not present

## 2019-08-03 DIAGNOSIS — Z79899 Other long term (current) drug therapy: Secondary | ICD-10-CM | POA: Diagnosis not present

## 2019-08-03 DIAGNOSIS — F329 Major depressive disorder, single episode, unspecified: Secondary | ICD-10-CM | POA: Diagnosis not present

## 2019-08-03 DIAGNOSIS — E11319 Type 2 diabetes mellitus with unspecified diabetic retinopathy without macular edema: Secondary | ICD-10-CM | POA: Diagnosis not present

## 2019-08-03 DIAGNOSIS — K589 Irritable bowel syndrome without diarrhea: Secondary | ICD-10-CM | POA: Diagnosis not present

## 2019-08-03 DIAGNOSIS — Z8042 Family history of malignant neoplasm of prostate: Secondary | ICD-10-CM | POA: Diagnosis not present

## 2019-08-03 DIAGNOSIS — Z791 Long term (current) use of non-steroidal anti-inflammatories (NSAID): Secondary | ICD-10-CM | POA: Diagnosis not present

## 2019-08-03 DIAGNOSIS — F419 Anxiety disorder, unspecified: Secondary | ICD-10-CM | POA: Diagnosis not present

## 2019-08-03 DIAGNOSIS — Z8249 Family history of ischemic heart disease and other diseases of the circulatory system: Secondary | ICD-10-CM | POA: Diagnosis not present

## 2019-08-03 DIAGNOSIS — N6459 Other signs and symptoms in breast: Secondary | ICD-10-CM | POA: Diagnosis not present

## 2019-08-03 DIAGNOSIS — N6489 Other specified disorders of breast: Secondary | ICD-10-CM

## 2019-08-03 DIAGNOSIS — G4700 Insomnia, unspecified: Secondary | ICD-10-CM | POA: Diagnosis not present

## 2019-08-03 DIAGNOSIS — Z833 Family history of diabetes mellitus: Secondary | ICD-10-CM | POA: Diagnosis not present

## 2019-08-03 NOTE — Progress Notes (Signed)
[name], [place] [id number]   CLINIC:  Medical Oncology/Hematology  PCP:  [name] Norlander, DO [place] [address] [phone]   REASON FOR VISIT: Follow-up for radical scar of left breast  CURRENT THERAPY: Observation   INTERVAL HISTORY:  [name] 78 y.o. female returns for routine follow-up for radical scar left breast.  Patient reports she has been doing well since her last visit.  She has had no problems.  She denies any new lumps that she has felt.  She denies any bleeding per rectum.  She denies any new abdominal pain or bone pain. Denies any nausea, vomiting, or diarrhea. Denies any new pains. Had not noticed any recent bleeding such as epistaxis, hematuria or hematochezia. Denies recent chest pain on exertion, shortness of breath on minimal exertion, pre-syncopal episodes, or palpitations. Denies any numbness or tingling in hands or feet. Denies any recent fevers, infections, or recent hospitalizations. Patient reports appetite at 100% and energy level at 75%.  She is eating well and maintaining her weight at this time.    REVIEW OF SYSTEMS:  Review of Systems  Gastrointestinal: Positive for constipation.  Psychiatric/Behavioral: Positive for depression. The patient is nervous/anxious.   All other systems reviewed and are negative.    PAST MEDICAL/SURGICAL HISTORY:  Past Medical History:  Diagnosis Date  . Acute cystitis   . Anxiety   . Depression   . Diabetes mellitus type II   . Diabetic retinopathy   . DM type 2 causing CKD stage 3 ([place])   . Fibromyalgia   . GERD (gastroesophageal reflux disease)   . Hyperlipidemia   . Hypertension   . IBS (irritable bowel syndrome)   . IBS (irritable bowel syndrome)   . Insomnia   . Lichen planus    vulvar  . Pancreatitis, gallstone   . Postmenopausal   . Radial scar of breast 06/04/2016  . Urge incontinence of urine    Past Surgical History:  Procedure  Laterality Date  . ANKLE SURGERY  02/2004  . BREAST LUMPECTOMY WITH RADIOACTIVE SEED LOCALIZATION Left 01/22/2016   Procedure: LEFT BREAST LUMPECTOMY WITH RADIOACTIVE SEED LOCALIZATION;  Surgeon: Autumn Messing III, MD;  Location: [facility];  Service: General;  Laterality: Left;  . CATARACT EXTRACTION W/ INTRAOCULAR LENS IMPLANT  09/2003, 12/2003  . CHOLECYSTECTOMY  2004  . EYE SURGERY     laser treatments  . JOINT REPLACEMENT    . TOTAL KNEE ARTHROPLASTY     Left  . TRIGGER FINGER RELEASE Left 01/30/2002  . TUBAL LIGATION  1970     SOCIAL HISTORY:  Social History   Socioeconomic History  . Marital status: Widowed    Spouse name: Not on file  . Number of children: 3  . Years of education: 74  . Highest education level: Some college, no degree  Occupational History  . Occupation: retired    Fish farm manager: RETIRED    Comment: bookeeping  Social Needs  . Financial resource strain: Not hard at all  . Food insecurity    Worry: Never true    Inability: Never true  . Transportation needs    Medical: No    Non-medical: No  Tobacco Use  . Smoking status: Never Smoker  . Smokeless tobacco: Never Used  Substance and Sexual Activity  . Alcohol use: No  . Drug use: No  . Sexual activity: Not Currently  Lifestyle  . Physical activity  Days per week: 3 days    Minutes per session: 30 min  . Stress: Only a little  Relationships  . Social connections    Talks on phone: More than three times a week    Gets together: Once a week    Attends religious service: Never    Active member of club or organization: No    Attends meetings of clubs or organizations: Never    Relationship status: Widowed  . Intimate partner violence    Fear of current or ex partner: No    Emotionally abused: No    Physically abused: No    Forced sexual activity: No  Other Topics Concern  . Not on file  Social History Narrative   Widowed in 2010    FAMILY HISTORY:  Family History  Problem  Relation Age of Onset  . Diabetes Mother   . Hypertension Mother   . Kidney disease Mother   . Heart disease Mother   . Peripheral vascular disease Mother        amputation  . Cancer Father        prostate  . Diabetes Father   . Heart disease Father        CABG  . Hypertension Father   . Heart attack Father   . Depression Sister   . Hypertension Sister   . Neuropathy Sister     CURRENT MEDICATIONS:  Outpatient Encounter Medications as of 08/03/2019  Medication Sig  . aspirin 81 MG tablet Take 81 mg by mouth every other day.   Marland Kitchen atorvastatin (LIPITOR) 20 MG tablet Take 0.5 tablets (10 mg total) by mouth at bedtime. Disregard 30 day supply  . Calcium Carb-Cholecalciferol (CALCIUM 600+D3) 600-200 MG-UNIT TABS Take 1 tablet by mouth 2 (two) times daily.    . cephALEXin (KEFLEX) 500 MG capsule Take 500 mg by mouth daily. For UTI prevention  . Dulaglutide (TRULICITY) 1.5 0000000 SOPN Inject 1.5 mg into the skin once a week.  . escitalopram (LEXAPRO) 20 MG tablet Take 1 tablet (20 mg total) by mouth daily.  Marland Kitchen esomeprazole (NEXIUM) 20 MG capsule Take 1 capsule (20 mg total) by mouth daily at 12 noon. Disregard 30 day supply  . estradiol (ESTRACE) 0.1 MG/GM vaginal cream Place 2 g vaginally every Monday, Wednesday, and Friday.   Marland Kitchen glucose blood (FREESTYLE LITE) test strip Use qid to check blood sugar. Dx E11.9  . Insulin Glargine (LANTUS SOLOSTAR) 100 UNIT/ML Solostar Pen Inject 26 Units into the skin every morning.  . Insulin Pen Needle 31G X 5 MM MISC 1 Device by Does not apply route 4 (four) times daily.  . mirabegron ER (MYRBETRIQ) 50 MG TB24 tablet Take 50 mg by mouth daily.  . Multiple Vitamin (MULTIVITAMIN) tablet Take 1 tablet by mouth daily.   . Probiotic Product (PROBIOTIC ADVANCED PO) Take 1 capsule by mouth daily.   . traZODone (DESYREL) 150 MG tablet Take 0.5 tablets (75 mg total) by mouth at bedtime.  Marland Kitchen acetaminophen (TYLENOL) 325 MG tablet Take 325 mg by mouth as needed.   . desonide (DESOWEN) 0.05 % ointment Apply 1 application topically 2 (two) times daily. (Patient not taking: Reported on 08/03/2019)  . diclofenac sodium (VOLTAREN) 1 % GEL Apply 4 g topically 4 (four) times daily. (Patient not taking: Reported on 08/03/2019)  . fluticasone (FLONASE) 50 MCG/ACT nasal spray Place 2 sprays into both nostrils daily. (Patient not taking: Reported on 08/03/2019)  . insulin aspart (NOVOLOG FLEXPEN) 100 UNIT/ML FlexPen BG  less than 80 - no insulin; 80 to 120 - 3 units; 121 to 150 - 4 units; 151 to 200 - 5 units; 201 to 250 - 6 units; 250 or above - 7 units (Patient not taking: Reported on 08/03/2019)  . meclizine (ANTIVERT) 25 MG tablet Take 1 tablet (25 mg total) by mouth 3 (three) times daily as needed for dizziness. (Patient not taking: Reported on 08/03/2019)  . nystatin (NYSTATIN) powder APPLY UNDER BILATERAL BREASTS AND GROIN TWICE A DAY (Patient not taking: Reported on 08/03/2019)  . nystatin-triamcinolone ointment (MYCOLOG) APPLY TOPICALLY 2 (TWO) TIMES DAILY. (Patient not taking: Reported on 08/03/2019)  . Polyethyl Glycol-Propyl Glycol 0.4-0.3 % SOLN Apply 1 drop to eye daily as needed (for dry eye relief).   . polyethylene glycol (MIRALAX / GLYCOLAX) packet Take 17 g by mouth daily as needed.    No facility-administered encounter medications on file as of 08/03/2019.     ALLERGIES:  Allergies  Allergen Reactions  . Latex Anaphylaxis and Rash  . Ambien [Zolpidem] Other (See Comments)    Hallucinations  . Augmentin [Amoxicillin-Pot Clavulanate] Nausea And Vomiting  . Belsomra [Suvorexant] Other (See Comments)    Hallucinations  . Hydrocodone-Acetaminophen Other (See Comments)    hallucinations  . Hyoscyamine Sulfate Other (See Comments)  . Melatonin Other (See Comments)    Adverse reaction-"keeps me awake"  . Mobic [Meloxicam]   . Morphine Nausea And Vomiting    REACTION: hallucinations,GI upset  . Oxycodone Other (See Comments)    Hallucinations  .  Tramadol     Unknown reaction  . Celebrex [Celecoxib] Itching and Rash  . Sulfa Antibiotics Rash     PHYSICAL EXAM:  ECOG Performance status: 1  Vitals:   08/03/19 1132  BP: (!) 174/64  Pulse: 74  Resp: 18  Temp: (!) 96.9 F (36.1 C)  SpO2: 96%   Filed Weights   08/03/19 1132  Weight: 156 lb 4.8 oz (70.9 kg)    Physical Exam Constitutional:      Appearance: Normal appearance. She is normal weight.  Cardiovascular:     Rate and Rhythm: Normal rate and regular rhythm.     Heart sounds: Normal heart sounds.  Pulmonary:     Effort: Pulmonary effort is normal.     Breath sounds: Normal breath sounds.  Abdominal:     General: Bowel sounds are normal.     Palpations: Abdomen is soft.  Musculoskeletal: Normal range of motion.  Skin:    General: Skin is warm.  Neurological:     Mental Status: She is alert and oriented to person, place, and time. Mental status is at baseline.  Psychiatric:        Mood and Affect: Mood normal.        Behavior: Behavior normal.        Thought Content: Thought content normal.        Judgment: Judgment normal.      LABORATORY DATA:  I have reviewed the labs as listed.  CBC    Component Value Date/Time   WBC 4.9 06/11/2019 1217   WBC 13.1 (H) 09/24/2016 2133   RBC 4.26 06/11/2019 1217   RBC 4.37 09/24/2016 2133   HGB 13.7 06/11/2019 1217   HCT 40.5 06/11/2019 1217   PLT 151 06/11/2019 1217   MCV 95 06/11/2019 1217   MCH 32.2 06/11/2019 1217   MCH 31.6 09/24/2016 2133   MCHC 33.8 06/11/2019 1217   MCHC 34.9 09/24/2016 2133   RDW 12.5 06/11/2019  1217   LYMPHSABS 1.2 06/11/2019 1217   MONOABS 1.0 09/24/2016 2133   EOSABS 0.1 06/11/2019 1217   BASOSABS 0.0 06/11/2019 1217   CMP Latest Ref Rng & Units 06/11/2019 12/05/2018 06/21/2018  Glucose 65 - 99 mg/dL 174(H) 257(H) 186(H)  BUN 8 - 27 mg/dL 19 24 22   Creatinine 0.57 - 1.00 mg/dL 1.18(H) 1.25(H) 1.26(H)  Sodium 134 - 144 mmol/L 139 137 135  Potassium 3.5 - 5.2 mmol/L 4.6 4.7  4.6  Chloride 96 - 106 mmol/L 96 99 95(L)  CO2 20 - 29 mmol/L 30(H) 24 29  Calcium 8.7 - 10.3 mg/dL 9.6 9.4 9.2  Total Protein 6.0 - 8.5 g/dL 6.7 5.9(L) -  Total Bilirubin 0.0 - 1.2 mg/dL 0.3 0.3 -  Alkaline Phos 39 - 117 IU/L 52 53 -  AST 0 - 40 IU/L 23 22 -  ALT 0 - 32 IU/L 20 21 -     I personally performed a face-to-face visit.  All questions were answered to patient's stated satisfaction. Encouraged patient to call with any new concerns or questions before his next visit to the cancer center and we can certain see him sooner, if needed.     ASSESSMENT & PLAN:   Radial scar of breast 1.  Radical scar of left breast: - She had complex sclerosing lesion/radical scar showing fibrocystic changes with usual ductal hyperplasia with associated calcifications. - Biopsy confirmed on 12/12/2015 followed by a left breast lumpectomy by Dr. Marlou Starks on 01/22/2016 again confirming diagnosis without any identified malignancy. -She has chronic left nipple inversion for many years.  There is no evidence of atypical ductal hyperplasia or other high risk breast lesion. - We will follow this yearly as sclerosing lesions can slowly progress from scar to hyperplasia to carcinoma over time. -Labs done at [facility] on 07/27/2019 showed potassium 4.8, creatinine 1.21, LFTs WNL, WBC 4.8, hemoglobin 13.5, platelets 153, vitamin D 53.9 -Patient reports she had a mammogram done with Dr. Quentin Cornwall last year.  Her yearly mammogram is at the end of December. -Patient deferred breast examination today.  Patient was requesting after next appointment to be followed by her primary care doctor -She will follow-up in 1 year with repeat mammogram and labs.      Orders placed this encounter:  Orders Placed This Encounter  Procedures  . Lactate dehydrogenase  . CBC with Differential/Platelet  . Comprehensive metabolic panel  . Vitamin B12  . VITAMIN D 25 Hydroxy (Vit-D Deficiency, Fractures)     Francene Finders, FNP-C  [place] [phone]

## 2019-08-03 NOTE — Patient Instructions (Signed)
[facility] at [facility] Discharge Instructions  Follow up in 1 year with labs    Thank you for choosing [facility] at [facility] to provide your oncology and hematology care.  To afford each patient quality time with our provider, please arrive at least 15 minutes before your scheduled appointment time.   If you have a lab appointment with the Cancer Center please come in thru the Main Entrance and check in at the main information desk.  You need to re-schedule your appointment should you arrive 10 or more minutes late.  We strive to give you quality time with our providers, and arriving late affects you and other patients whose appointments are after yours.  Also, if you no show three or more times for appointments you may be dismissed from the clinic at the providers discretion.     Again, thank you for choosing [facility].  Our hope is that these requests will decrease the amount of time that you wait before being seen by our physicians.       _____________________________________________________________  Should you have questions after your visit to  Cancer Center, please contact our office at [phone] between the hours of 8:00 a.m. and 4:30 p.m.  Voicemails left after 4:00 p.m. will not be returned until the following business day.  For prescription refill requests, have your pharmacy contact our office and allow 72 hours.    Due to Covid, you will need to wear a mask upon entering the hospital. If you do not have a mask, a mask will be given to you at the Main Entrance upon arrival. For doctor visits, patients may have 1 support person with them. For treatment visits, patients can not have anyone with them due to social distancing guidelines and our immunocompromised population.      

## 2019-08-03 NOTE — Assessment & Plan Note (Addendum)
1.  Radical scar of left breast: - She had complex sclerosing lesion/radical scar showing fibrocystic changes with usual ductal hyperplasia with associated calcifications. - Biopsy confirmed on 12/12/2015 followed by a left breast lumpectomy by Dr. Marlou Starks on 01/22/2016 again confirming diagnosis without any identified malignancy. -She has chronic left nipple inversion for many years.  There is no evidence of atypical ductal hyperplasia or other high risk breast lesion. - We will follow this yearly as sclerosing lesions can slowly progress from scar to hyperplasia to carcinoma over time. -Labs done at [facility] on 07/27/2019 showed potassium 4.8, creatinine 1.21, LFTs WNL, WBC 4.8, hemoglobin 13.5, platelets 153, vitamin D 53.9 -Patient reports she had a mammogram done with Dr. Quentin Cornwall last year.  Her yearly mammogram is at the end of December. -Patient deferred breast examination today.  Patient was requesting after next appointment to be followed by her primary care doctor -She will follow-up in 1 year with repeat mammogram and labs.

## 2019-08-07 ENCOUNTER — Ambulatory Visit: Payer: Medicare Other | Admitting: Family Medicine

## 2019-08-07 ENCOUNTER — Ambulatory Visit (INDEPENDENT_AMBULATORY_CARE_PROVIDER_SITE_OTHER): Payer: Medicare Other | Admitting: Family Medicine

## 2019-08-07 VITALS — BP 124/74

## 2019-08-07 DIAGNOSIS — E1159 Type 2 diabetes mellitus with other circulatory complications: Secondary | ICD-10-CM

## 2019-08-07 DIAGNOSIS — K219 Gastro-esophageal reflux disease without esophagitis: Secondary | ICD-10-CM

## 2019-08-07 DIAGNOSIS — N761 Subacute and chronic vaginitis: Secondary | ICD-10-CM

## 2019-08-07 DIAGNOSIS — B372 Candidiasis of skin and nail: Secondary | ICD-10-CM | POA: Diagnosis not present

## 2019-08-07 DIAGNOSIS — E785 Hyperlipidemia, unspecified: Secondary | ICD-10-CM

## 2019-08-07 DIAGNOSIS — I10 Essential (primary) hypertension: Secondary | ICD-10-CM

## 2019-08-07 MED ORDER — ATORVASTATIN CALCIUM 10 MG PO TABS: 10.0000 mg | ORAL_TABLET | Freq: Every day | ORAL | 3 refills | Status: DC

## 2019-08-07 MED ORDER — NYSTATIN 100000 UNIT/GM EX POWD: CUTANEOUS | 3 refills | Status: DC

## 2019-08-07 MED ORDER — ESOMEPRAZOLE MAGNESIUM 20 MG PO CPDR: 20.0000 mg | DELAYED_RELEASE_CAPSULE | Freq: Every day | ORAL | 3 refills | Status: DC

## 2019-08-07 MED ORDER — ESTRADIOL 0.1 MG/GM VA CREA: 2.0000 g | TOPICAL_CREAM | VAGINAL | 1 refills | Status: DC

## 2019-08-07 MED ORDER — DESONIDE 0.05 % EX CREA: TOPICAL_CREAM | Freq: Two times a day (BID) | CUTANEOUS | 1 refills | Status: DC

## 2019-08-07 NOTE — Progress Notes (Signed)
Telephone visit  Subjective: CC: HTN PCP: [name], DO PB:[id number] [name] is a 78 y.o. female calls for telephone consult today. Patient provides verbal consent for consult held via phone.  Location of patient: home Location of provider: Working remotely from home Others present for call: none  1. Hypertension Patient reports dizziness has resolved.  She reports BPs have since been high.  Her most recent 174/64 at the cancer center.  Her repeat BP at home 134/70s.   It has since come down 120/74-140s/70s.  She has felt a little strange but no chest pain, shortness of breath, falls or dizziness.  She was previously on lisinopril 2.5 mg daily but was having low blood pressures as a result and therefore discontinued it.  Medical history significant for CKD 3  2.  Vaginitis Patient with longstanding history of vaginitis.  She thinks she has lichen sclerosis and is prescribed desonide by her OB/GYN, Dr. Paula Compton.  She is out of this medicine and is in need of it.  She does use it twice daily every day.  She also uses vaginal estrogen cream 3 times per week.  She has a follow-up visit with Dr. Marvel Plan in December but needs refills.  3.  Intertrigo Patient reports that she often gets a rash under her breasts in her groin area.  She uses nystatin powder every day twice daily for "prevention".  Denies any active rash, itching, discharge or skin breakdown.   ROS: Per HPI  Allergies  Allergen Reactions  . Latex Anaphylaxis and Rash  . Ambien [Zolpidem] Other (See Comments)    Hallucinations  . Augmentin [Amoxicillin-Pot Clavulanate] Nausea And Vomiting  . Belsomra [Suvorexant] Other (See Comments)    Hallucinations  . Hydrocodone-Acetaminophen Other (See Comments)    hallucinations  . Hyoscyamine Sulfate Other (See Comments)  . Melatonin Other (See Comments)    Adverse reaction-"keeps me awake"  . Mobic [Meloxicam]   . Morphine Nausea And Vomiting    REACTION:  hallucinations,GI upset  . Oxycodone Other (See Comments)    Hallucinations  . Tramadol     Unknown reaction  . Celebrex [Celecoxib] Itching and Rash  . Sulfa Antibiotics Rash   Past Medical History:  Diagnosis Date  . Acute cystitis   . Anxiety   . Depression   . Diabetes mellitus type II   . Diabetic retinopathy   . DM type 2 causing CKD stage 3 ([place])   . Fibromyalgia   . GERD (gastroesophageal reflux disease)   . Hyperlipidemia   . Hypertension   . IBS (irritable bowel syndrome)   . IBS (irritable bowel syndrome)   . Insomnia   . Lichen planus    vulvar  . Pancreatitis, gallstone   . Postmenopausal   . Radial scar of breast 06/04/2016  . Urge incontinence of urine     Current Outpatient Medications:  .  acetaminophen (TYLENOL) 325 MG tablet, Take 325 mg by mouth as needed., Disp: , Rfl:  .  aspirin 81 MG tablet, Take 81 mg by mouth every other day. , Disp: , Rfl:  .  atorvastatin (LIPITOR) 20 MG tablet, Take 0.5 tablets (10 mg total) by mouth at bedtime. Disregard 30 day supply, Disp: 45 tablet, Rfl: 0 .  Calcium Carb-Cholecalciferol (CALCIUM 600+D3) 600-200 MG-UNIT TABS, Take 1 tablet by mouth 2 (two) times daily.  , Disp: , Rfl:  .  cephALEXin (KEFLEX) 500 MG capsule, Take 500 mg by mouth daily. For UTI prevention, Disp: , Rfl:  .  desonide (DESOWEN) 0.05 % ointment, Apply 1 application topically 2 (two) times daily. (Patient not taking: Reported on 08/03/2019), Disp: 15 g, Rfl: 0 .  diclofenac sodium (VOLTAREN) 1 % GEL, Apply 4 g topically 4 (four) times daily. (Patient not taking: Reported on 08/03/2019), Disp: 400 g, Rfl: 3 .  Dulaglutide (TRULICITY) 1.5 0000000 SOPN, Inject 1.5 mg into the skin once a week., Disp: 12 mL, Rfl: 0 .  escitalopram (LEXAPRO) 20 MG tablet, Take 1 tablet (20 mg total) by mouth daily., Disp: 90 tablet, Rfl: 3 .  esomeprazole (NEXIUM) 20 MG capsule, Take 1 capsule (20 mg total) by mouth daily at 12 noon. Disregard 30 day supply, Disp: 90  capsule, Rfl: 0 .  estradiol (ESTRACE) 0.1 MG/GM vaginal cream, Place 2 g vaginally every Monday, Wednesday, and Friday. , Disp: , Rfl:  .  fluticasone (FLONASE) 50 MCG/ACT nasal spray, Place 2 sprays into both nostrils daily. (Patient not taking: Reported on 08/03/2019), Disp: 16 g, Rfl: 6 .  glucose blood (FREESTYLE LITE) test strip, Use qid to check blood sugar. Dx E11.9, Disp: 200 each, Rfl: 11 .  insulin aspart (NOVOLOG FLEXPEN) 100 UNIT/ML FlexPen, BG less than 80 - no insulin; 80 to 120 - 3 units; 121 to 150 - 4 units; 151 to 200 - 5 units; 201 to 250 - 6 units; 250 or above - 7 units (Patient not taking: Reported on 08/03/2019), Disp: 30 mL, Rfl: 3 .  Insulin Glargine (LANTUS SOLOSTAR) 100 UNIT/ML Solostar Pen, Inject 26 Units into the skin every morning., Disp: 45 mL, Rfl: 3 .  Insulin Pen Needle 31G X 5 MM MISC, 1 Device by Does not apply route 4 (four) times daily., Disp: 300 each, Rfl: 4 .  meclizine (ANTIVERT) 25 MG tablet, Take 1 tablet (25 mg total) by mouth 3 (three) times daily as needed for dizziness. (Patient not taking: Reported on 08/03/2019), Disp: 30 tablet, Rfl: 0 .  mirabegron ER (MYRBETRIQ) 50 MG TB24 tablet, Take 50 mg by mouth daily., Disp: , Rfl:  .  Multiple Vitamin (MULTIVITAMIN) tablet, Take 1 tablet by mouth daily. , Disp: , Rfl:  .  nystatin (NYSTATIN) powder, APPLY UNDER BILATERAL BREASTS AND GROIN TWICE A DAY (Patient not taking: Reported on 08/03/2019), Disp: 60 g, Rfl: 3 .  nystatin-triamcinolone ointment (MYCOLOG), APPLY TOPICALLY 2 (TWO) TIMES DAILY. (Patient not taking: Reported on 08/03/2019), Disp: 60 g, Rfl: 3 .  Polyethyl Glycol-Propyl Glycol 0.4-0.3 % SOLN, Apply 1 drop to eye daily as needed (for dry eye relief). , Disp: , Rfl:  .  polyethylene glycol (MIRALAX / GLYCOLAX) packet, Take 17 g by mouth daily as needed. , Disp: , Rfl:  .  Probiotic Product (PROBIOTIC ADVANCED PO), Take 1 capsule by mouth daily. , Disp: , Rfl:  .  traZODone (DESYREL) 150 MG tablet,  Take 0.5 tablets (75 mg total) by mouth at bedtime., Disp: 45 tablet, Rfl: 0  Assessment/ Plan: 78 y.o. female   1. Hypertension associated with diabetes ([place]) She has had some spikes in blood pressure but overall blood pressure is well controlled without medication.  We did previously discontinue the lisinopril 2.5 mg because she was having hypotensive episodes where she was symptomatic and orthostatic.  I have advised her to monitor blood pressures closely for the next 2 weeks.  We discussed on how to measure her blood pressure appropriately.  If she has ongoing elevations in blood pressure they are not precipitated by situation, may need to consider adding back  lisinopril at low-dose.  2. Gastroesophageal reflux disease without esophagitis Controlled.  Continue Nexium - esomeprazole (NEXIUM) 20 MG capsule; Take 1 capsule (20 mg total) by mouth daily at 12 noon.  Dispense: 90 capsule; Refill: 3  3. Hyperlipidemia, unspecified hyperlipidemia type Continue Lipitor.  I have changed it to the 10 mg formulation for convenience - atorvastatin (LIPITOR) 10 MG tablet; Take 1 tablet (10 mg total) by mouth at bedtime.  Dispense: 90 tablet; Refill: 3  4. Chronic vaginitis Managed by her OB/GYN, who she has an appointment with in December.  I have given her enough refills of her medications to last her until then - estradiol (ESTRACE) 0.1 MG/GM vaginal cream; Place 2 g vaginally every Monday, Wednesday, and Friday.  Dispense: 42.5 g; Refill: 1 - desonide (DESOWEN) 0.05 % cream; Apply topically 2 (two) times daily.  Dispense: 30 g; Refill: 1  5. Intertriginous candidiasis We discussed that this is not intended to be preventative.  We discussed keeping the area dry to prevent infection.  Nystatin sent to have on hand if needed for candidal intertrigo.  No active infection currently. - nystatin (NYSTATIN) powder; APPLY UNDER BILATERAL BREASTS AND GROIN TWICE A DAY (If needed)  Dispense: 60 g; Refill:  3    Start time: 10:16am End time: 10:29am  Total time spent on patient care (including telephone call/ virtual visit): 19 minutes  [place], [place] [phone]

## 2019-08-22 DIAGNOSIS — E1140 Type 2 diabetes mellitus with diabetic neuropathy, unspecified: Secondary | ICD-10-CM | POA: Diagnosis not present

## 2019-08-22 DIAGNOSIS — Z794 Long term (current) use of insulin: Secondary | ICD-10-CM | POA: Diagnosis not present

## 2019-08-29 ENCOUNTER — Telehealth: Payer: Self-pay | Admitting: Family Medicine

## 2019-08-29 NOTE — Telephone Encounter (Signed)
Patient to drop readings off

## 2019-09-04 ENCOUNTER — Telehealth: Payer: Self-pay | Admitting: Family Medicine

## 2019-09-04 ENCOUNTER — Other Ambulatory Visit: Payer: Self-pay | Admitting: Family Medicine

## 2019-09-04 DIAGNOSIS — E1159 Type 2 diabetes mellitus with other circulatory complications: Secondary | ICD-10-CM

## 2019-09-04 MED ORDER — AMLODIPINE BESYLATE 5 MG PO TABS: 5.0000 mg | ORAL_TABLET | Freq: Every day | ORAL | 3 refills | Status: DC

## 2019-09-04 NOTE — Telephone Encounter (Signed)
LM 11/10 - jhb

## 2019-09-04 NOTE — Telephone Encounter (Signed)
Patient brought in BP readings from home and wanted to know if you looked at them yet?

## 2019-09-04 NOTE — Telephone Encounter (Signed)
Yes. I did.  They are a little on the high side.  I have sent in Norvasc 5mg .  Take 1 tablet daily.  Continue to monitor BPs.  See RN in 1 month for recheck in office.

## 2019-09-05 ENCOUNTER — Telehealth: Payer: Self-pay | Admitting: Family Medicine

## 2019-09-05 NOTE — Telephone Encounter (Signed)
Patient aware.

## 2019-09-11 ENCOUNTER — Telehealth: Payer: Self-pay | Admitting: Family Medicine

## 2019-09-11 NOTE — Telephone Encounter (Signed)
Aware of new medication. 

## 2019-09-11 NOTE — Telephone Encounter (Signed)
I received a notification from the [place] about amlodipine.  Apparently this is on the patient's allergy list at the [place].  We do not have an amlodipine allergy on our list so I called to clarify.  Patient does not recall ever having an allergic reaction, certainly no anaphylaxis, to amlodipine in the past.  She would like to proceed with medication.  We discussed signs and symptoms warranting discontinuation and emergent evaluation.  We discussed common side effects such as lower extremity edema.  She will contact me if she has any concerns going forward.

## 2019-09-19 DIAGNOSIS — Z1231 Encounter for screening mammogram for malignant neoplasm of breast: Secondary | ICD-10-CM | POA: Diagnosis not present

## 2019-09-19 DIAGNOSIS — Z01419 Encounter for gynecological examination (general) (routine) without abnormal findings: Secondary | ICD-10-CM | POA: Diagnosis not present

## 2019-09-19 DIAGNOSIS — N909 Noninflammatory disorder of vulva and perineum, unspecified: Secondary | ICD-10-CM | POA: Diagnosis not present

## 2019-09-19 DIAGNOSIS — Z1389 Encounter for screening for other disorder: Secondary | ICD-10-CM | POA: Diagnosis not present

## 2019-09-19 DIAGNOSIS — Z124 Encounter for screening for malignant neoplasm of cervix: Secondary | ICD-10-CM | POA: Diagnosis not present

## 2019-10-02 ENCOUNTER — Other Ambulatory Visit: Payer: Self-pay | Admitting: Family Medicine

## 2019-10-02 DIAGNOSIS — N183 Chronic kidney disease, stage 3 unspecified: Secondary | ICD-10-CM

## 2019-10-02 DIAGNOSIS — E1122 Type 2 diabetes mellitus with diabetic chronic kidney disease: Secondary | ICD-10-CM

## 2019-10-02 MED ORDER — LANTUS SOLOSTAR 100 UNIT/ML ~~LOC~~ SOPN: 26.0000 [IU] | PEN_INJECTOR | SUBCUTANEOUS | 0 refills | Status: DC

## 2019-10-02 NOTE — Telephone Encounter (Signed)
rx sent over to local pharmacy so pt doesn't run out while waiting for the mail order pharmacy. Left message on pt VM advising requested rx sent to pharmacy and to call back with any other questions or concerns.

## 2019-10-04 ENCOUNTER — Ambulatory Visit: Payer: Medicare Other

## 2019-10-12 ENCOUNTER — Other Ambulatory Visit: Payer: Self-pay

## 2019-10-15 ENCOUNTER — Other Ambulatory Visit: Payer: Self-pay

## 2019-10-15 ENCOUNTER — Ambulatory Visit: Payer: Medicare Other

## 2019-10-15 VITALS — BP 135/70 | HR 81

## 2019-10-15 DIAGNOSIS — E1159 Type 2 diabetes mellitus with other circulatory complications: Secondary | ICD-10-CM

## 2019-10-15 NOTE — Progress Notes (Signed)
Patient here today for blood pressure check after visit with Dr. Lajuana Ripple.  Blood pressure today was 135/70, pulse 81.

## 2019-11-05 ENCOUNTER — Encounter (INDEPENDENT_AMBULATORY_CARE_PROVIDER_SITE_OTHER): Payer: Medicare Other | Admitting: Ophthalmology

## 2019-11-16 ENCOUNTER — Telehealth: Payer: Self-pay | Admitting: Family Medicine

## 2019-11-16 DIAGNOSIS — Z794 Long term (current) use of insulin: Secondary | ICD-10-CM

## 2019-11-16 DIAGNOSIS — B372 Candidiasis of skin and nail: Secondary | ICD-10-CM

## 2019-11-16 DIAGNOSIS — E1122 Type 2 diabetes mellitus with diabetic chronic kidney disease: Secondary | ICD-10-CM

## 2019-11-16 MED ORDER — NYSTATIN 100000 UNIT/GM EX POWD: CUTANEOUS | 1 refills | Status: DC

## 2019-11-16 MED ORDER — LANTUS SOLOSTAR 100 UNIT/ML ~~LOC~~ SOPN: 26.0000 [IU] | PEN_INJECTOR | SUBCUTANEOUS | 1 refills | Status: DC

## 2019-11-16 MED ORDER — TRAZODONE HCL 150 MG PO TABS: 75.0000 mg | ORAL_TABLET | Freq: Every day | ORAL | 1 refills | Status: DC

## 2019-11-16 MED ORDER — TRULICITY 1.5 MG/0.5ML ~~LOC~~ SOAJ: 1.5000 mg | SUBCUTANEOUS | 1 refills | Status: DC

## 2019-11-16 NOTE — Telephone Encounter (Signed)
What is the name of the medication? Nystatin ointment , trasadone , trulicity , lantus  Have you contacted your pharmacy to request a refill?yes  Which pharmacy would you like this sent to? [facility]  Supp[facility] and [facility] mth refills [facility]   Patient notified that their request is being sent to the clinical staff for review and that they should receive a call once it is complete. If they do not receive a call within 24 hours they can check with their pharmacy or our office.

## 2019-11-28 ENCOUNTER — Other Ambulatory Visit: Payer: Self-pay

## 2019-11-28 ENCOUNTER — Encounter (INDEPENDENT_AMBULATORY_CARE_PROVIDER_SITE_OTHER): Payer: Medicare Other | Admitting: Ophthalmology

## 2019-11-28 DIAGNOSIS — H35033 Hypertensive retinopathy, bilateral: Secondary | ICD-10-CM

## 2019-11-28 DIAGNOSIS — H43813 Vitreous degeneration, bilateral: Secondary | ICD-10-CM

## 2019-11-28 DIAGNOSIS — I10 Essential (primary) hypertension: Secondary | ICD-10-CM | POA: Diagnosis not present

## 2019-11-28 DIAGNOSIS — E11319 Type 2 diabetes mellitus with unspecified diabetic retinopathy without macular edema: Secondary | ICD-10-CM

## 2019-11-28 DIAGNOSIS — E113393 Type 2 diabetes mellitus with moderate nonproliferative diabetic retinopathy without macular edema, bilateral: Secondary | ICD-10-CM | POA: Diagnosis not present

## 2019-11-29 ENCOUNTER — Ambulatory Visit: Payer: Medicare Other | Attending: Internal Medicine

## 2019-11-29 DIAGNOSIS — Z23 Encounter for immunization: Secondary | ICD-10-CM | POA: Insufficient documentation

## 2019-11-29 NOTE — Progress Notes (Signed)
The patient will observe these symptoms, and report promptly any worsening or unex

## 2019-11-29 NOTE — Progress Notes (Signed)
   Covid-19 Vaccination Clinic  Name:  [name]    MRN: [id number] DOB: [birth year]  11/29/2019  [name] was observed post Covid-19 immunization for 15 minutes without incidence. She was provided with Vaccine Information Sheet and instruction to access the V-Safe system.   [name] was instructed to call 911 with any severe reactions post vaccine: Marland Kitchen Difficulty breathing  . Swelling of your face and throat  . A fast heartbeat  . A bad rash all over your body  . Dizziness and weakness    Immunizations Administered    Name Date Dose VIS Date Route   Moderna COVID-19 Vaccine 11/29/2019 12:20 PM 0.5 mL 09/25/2019 Intramuscular   Manufacturer: Moderna   Lot: [id number]   [id number]

## 2019-12-31 ENCOUNTER — Ambulatory Visit: Payer: Medicare Other | Attending: Internal Medicine

## 2019-12-31 DIAGNOSIS — Z23 Encounter for immunization: Secondary | ICD-10-CM | POA: Insufficient documentation

## 2019-12-31 NOTE — Progress Notes (Signed)
   Covid-19 Vaccination Clinic  Name:  [name]    MRN: [id number] DOB: [birth year]  12/31/2019  [name] was observed post Covid-19 immunization for 15 minutes without incident. She was provided with Vaccine Information Sheet and instruction to access the V-Safe system.   [name] was instructed to call 911 with any severe reactions post vaccine: Marland Kitchen Difficulty breathing  . Swelling of face and throat  . A fast heartbeat  . A bad rash all over body  . Dizziness and weakness   Immunizations Administered    Name Date Dose VIS Date Route   Moderna COVID-19 Vaccine 12/31/2019 12:36 PM 0.5 mL 09/25/2019 Intramuscular   Manufacturer: Moderna   Lot: [id number]   [place]:[id number]

## 2020-02-13 ENCOUNTER — Telehealth: Payer: Self-pay | Admitting: Family Medicine

## 2020-02-13 ENCOUNTER — Other Ambulatory Visit: Payer: Self-pay | Admitting: Family Medicine

## 2020-02-13 DIAGNOSIS — B372 Candidiasis of skin and nail: Secondary | ICD-10-CM

## 2020-02-13 MED ORDER — NYSTATIN-TRIAMCINOLONE 100000-0.1 UNIT/GM-% EX OINT: TOPICAL_OINTMENT | CUTANEOUS | 3 refills | Status: DC

## 2020-02-13 NOTE — Telephone Encounter (Signed)
Aware of provider's advice. 

## 2020-02-13 NOTE — Telephone Encounter (Signed)
Left message to please call our office. 

## 2020-02-13 NOTE — Telephone Encounter (Signed)
Medication has been sent.  Please inform her that this is a medicine that is intended to be treatment only.  It is not a preventative medication.  I would recommend that she rely only on the nystatin powder and not the combination unless she is having significant itching and or pain at the rash site.  The cream can cause thinning of the skin and sometimes make the rash worse if used inappropriately.

## 2020-02-13 NOTE — Telephone Encounter (Signed)
°  Prescription Request  02/13/2020  What is the name of the medication or equipment? nystatin-triamcinolone ointment ([facility])    Have you contacted your pharmacy to request a refill? (if applicable) yes  Which pharmacy would you like this sent to? [facility], needs 3 month supply with 3 refills    Patient notified that their request is being sent to the clinical staff for review and that they should receive a response within 2 business days.

## 2020-02-13 NOTE — Telephone Encounter (Signed)
Patient has a rash under breast.  Please refill medicine.

## 2020-02-21 DIAGNOSIS — M79676 Pain in unspecified toe(s): Secondary | ICD-10-CM | POA: Diagnosis not present

## 2020-02-21 DIAGNOSIS — E1142 Type 2 diabetes mellitus with diabetic polyneuropathy: Secondary | ICD-10-CM | POA: Diagnosis not present

## 2020-02-21 DIAGNOSIS — B351 Tinea unguium: Secondary | ICD-10-CM | POA: Diagnosis not present

## 2020-02-21 DIAGNOSIS — L84 Corns and callosities: Secondary | ICD-10-CM | POA: Diagnosis not present

## 2020-04-01 DIAGNOSIS — Z794 Long term (current) use of insulin: Secondary | ICD-10-CM | POA: Diagnosis not present

## 2020-04-01 DIAGNOSIS — E1140 Type 2 diabetes mellitus with diabetic neuropathy, unspecified: Secondary | ICD-10-CM | POA: Diagnosis not present

## 2020-04-10 ENCOUNTER — Ambulatory Visit (INDEPENDENT_AMBULATORY_CARE_PROVIDER_SITE_OTHER): Payer: Medicare Other | Admitting: *Deleted

## 2020-04-10 DIAGNOSIS — Z0000 Encounter for general adult medical examination without abnormal findings: Secondary | ICD-10-CM

## 2020-04-10 NOTE — Progress Notes (Signed)
MEDICARE ANNUAL WELLNESS VISIT  04/10/2020  Telephone Visit Disclaimer This Medicare AWV was conducted by telephone due to national recommendations for restrictions regarding the COVID-19 Pandemic (e.g. social distancing).  I verified, using two identifiers, that I am speaking with [name] or their authorized healthcare agent. I discussed the limitations, risks, security, and privacy concerns of performing an evaluation and management service by telephone and the potential availability of an in-person appointment in the future. The patient expressed understanding and agreed to proceed.   Subjective:  [name] is a 79 y.o. female patient of [name], DO who had a Medicare Annual Wellness Visit today via telephone. [name] is Retired and lives alone. she has 3 children. she reports that she is socially active and does interact with friends/family regularly. she is not physically active and enjoys gardening, reading and puzzles.  Patient Care Team: [name], DO as PCP - General (Family Medicine) Paula Compton, MD as Consulting Physician (Obstetrics and Gynecology) Bjorn Loser, MD as Consulting Physician (Urology) Minus Breeding, MD as Consulting Physician (Cardiology) Steffanie Rainwater, [place] as Consulting Physician (Podiatry)  Advanced Directives 04/10/2020 08/03/2019 03/28/2019 08/02/2018 03/02/2018 07/12/2017 09/24/2016  Does Patient Have a Medical Advance Directive? Yes Yes Yes Yes Yes Yes Yes  Type of Advance Directive Living will;Out of facility DNR (pink MOST or yellow form);Healthcare Power of [place];Living will [place];Living will [place];Living will Living will;Healthcare Power of [place];Living will -  Does patient want to make changes to medical advance directive? No - Patient declined No - Patient declined Yes (MAU/Ambulatory/Procedural Areas -  Information given) No - Patient declined No - Patient declined No - Patient declined No - Patient declined  Copy of [place] in Chart? No - copy requested No - copy requested No - copy requested No - copy requested No - copy requested No - copy requested -  Would patient like information on creating a medical advance directive? - - - - No - Patient declined - [facility] Utilization Over the Past 12 Months: # of hospitalizations or ER visits: 0 # of surgeries: 0  Review of Systems    Patient reports that her overall health is unchanged compared to last year.  History obtained from chart review and the patient  Patient Reported Readings (BP, Pulse, CBG, Weight, etc) 135 CBG  Pain Assessment Pain : No/denies pain     Current Medications & Allergies (verified) Allergies as of 04/10/2020      Reactions   Latex Anaphylaxis, Rash   Ambien [zolpidem] Other (See Comments)   Hallucinations   Augmentin [amoxicillin-pot Clavulanate] Nausea And Vomiting   Belsomra [suvorexant] Other (See Comments)   Hallucinations   Hydrocodone-acetaminophen Other (See Comments)   hallucinations   Hyoscyamine Sulfate Other (See Comments)   Melatonin Other (See Comments)   Adverse reaction-"keeps me awake"   Mobic [meloxicam]    Morphine Nausea And Vomiting   REACTION: hallucinations,GI upset   Oxycodone Other (See Comments)   Hallucinations   Tramadol    Unknown reaction   Celebrex [celecoxib] Itching, Rash   Sulfa Antibiotics Rash      Medication List       Accurate as of April 10, 2020  1:58 PM. If you have any questions, ask your nurse or doctor.        amLODipine 5 MG tablet Commonly known as: NORVASC Take  1 tablet (5 mg total) by mouth daily.   aspirin 81 MG tablet Take 81 mg by mouth every other day.   atorvastatin 10 MG tablet Commonly known as: LIPITOR Take 1 tablet (10 mg total) by mouth at bedtime.   Calcium 600+D3 600-200 MG-UNIT Tabs Generic drug:  Calcium Carb-Cholecalciferol Take 1 tablet by mouth 2 (two) times daily.   cephALEXin 500 MG capsule Commonly known as: KEFLEX Take 500 mg by mouth daily. For UTI prevention   desonide 0.05 % cream Commonly known as: DesOwen Apply topically 2 (two) times daily.   diclofenac sodium 1 % Gel Commonly known as: VOLTAREN Apply 4 g topically 4 (four) times daily.   escitalopram 20 MG tablet Commonly known as: LEXAPRO Take 1 tablet (20 mg total) by mouth daily.   esomeprazole 20 MG capsule Commonly known as: NexIUM Take 1 capsule (20 mg total) by mouth daily at 12 noon.   estradiol 0.1 MG/GM vaginal cream Commonly known as: ESTRACE Place 2 g vaginally every Monday, Wednesday, and Friday.   fluticasone 50 MCG/ACT nasal spray Commonly known as: FLONASE Place 2 sprays into both nostrils daily.   glucose blood test strip Commonly known as: FREESTYLE LITE Use qid to check blood sugar. Dx E11.9   insulin aspart 100 UNIT/ML FlexPen Commonly known as: NovoLOG FlexPen BG less than 80 - no insulin; 80 to 120 - 3 units; 121 to 150 - 4 units; 151 to 200 - 5 units; 201 to 250 - 6 units; 250 or above - 7 units   Insulin Pen Needle 31G X 5 MM Misc 1 Device by Does not apply route 4 (four) times daily.   Lantus SoloStar 100 UNIT/ML Solostar Pen Generic drug: insulin glargine Inject 26 Units into the skin every morning.   meclizine 25 MG tablet Commonly known as: ANTIVERT Take 1 tablet (25 mg total) by mouth 3 (three) times daily as needed for dizziness.   multivitamin tablet Take 1 tablet by mouth daily.   Myrbetriq 50 MG Tb24 tablet Generic drug: mirabegron ER Take 50 mg by mouth daily.   nystatin powder Commonly known as: nystatin APPLY UNDER BILATERAL BREASTS AND GROIN TWICE A DAY (If needed)   nystatin-triamcinolone ointment Commonly known as: MYCOLOG APPLY TOPICALLY 2 (TWO) TIMES DAILY IF NEEDED FOR rash.  NOT A PREVENTATIVE MEDICATION.   Polyethyl Glycol-Propyl Glycol  0.4-0.3 % Soln Apply 1 drop to eye daily as needed (for dry eye relief).   polyethylene glycol 17 g packet Commonly known as: MIRALAX / GLYCOLAX Take 17 g by mouth daily as needed.   PROBIOTIC ADVANCED PO Take 1 capsule by mouth daily.   traZODone 150 MG tablet Commonly known as: DESYREL Take 0.5 tablets (75 mg total) by mouth at bedtime.   Trulicity 1.5 PY/1.9JK Sopn Generic drug: Dulaglutide Inject 1.5 mg into the skin once a week.   Tylenol 325 MG tablet Generic drug: acetaminophen Take 325 mg by mouth as needed.       History (reviewed): Past Medical History:  Diagnosis Date  . Acute cystitis   . Anxiety   . Depression   . Diabetes mellitus type II   . Diabetic retinopathy   . DM type 2 causing CKD stage 3 ([place])   . Fibromyalgia   . GERD (gastroesophageal reflux disease)   . Hyperlipidemia   . Hypertension   . IBS (irritable bowel syndrome)   . IBS (irritable bowel syndrome)   . Insomnia   . Lichen planus  vulvar  . Pancreatitis, gallstone   . Postmenopausal   . Radial scar of breast 06/04/2016  . Urge incontinence of urine    Past Surgical History:  Procedure Laterality Date  . ANKLE SURGERY  02/2004  . BREAST LUMPECTOMY WITH RADIOACTIVE SEED LOCALIZATION Left 01/22/2016   Procedure: LEFT BREAST LUMPECTOMY WITH RADIOACTIVE SEED LOCALIZATION;  Surgeon: Autumn Messing III, MD;  Location: [facility];  Service: General;  Laterality: Left;  . CATARACT EXTRACTION W/ INTRAOCULAR LENS IMPLANT  09/2003, 12/2003  . CHOLECYSTECTOMY  2004  . EYE SURGERY     laser treatments  . JOINT REPLACEMENT    . TOTAL KNEE ARTHROPLASTY     Left  . TRIGGER FINGER RELEASE Left 01/30/2002  . TUBAL LIGATION  1970   Family History  Problem Relation Age of Onset  . Diabetes Mother   . Hypertension Mother   . Kidney disease Mother   . Heart disease Mother   . Peripheral vascular disease Mother        amputation  . Cancer Father        prostate  . Diabetes  Father   . Heart disease Father        CABG  . Hypertension Father   . Heart attack Father   . Depression Sister   . Hypertension Sister   . Neuropathy Sister    Social History   Socioeconomic History  . Marital status: Widowed    Spouse name: Not on file  . Number of children: 3  . Years of education: 32  . Highest education level: Some college, no degree  Occupational History  . Occupation: retired    Fish farm manager: RETIRED    Comment: bookeeping  Tobacco Use  . Smoking status: Never Smoker  . Smokeless tobacco: Never Used  Vaping Use  . Vaping Use: Never used  Substance and Sexual Activity  . Alcohol use: No  . Drug use: No  . Sexual activity: Not Currently  Other Topics Concern  . Not on file  Social History Narrative   Widowed in 2010   Social Determinants of Health   Financial Resource Strain:   . Difficulty of Paying Living Expenses:   Food Insecurity: No Food Insecurity  . Worried About Charity fundraiser in the Last Year: Never true  . Ran Out of Food in the Last Year: Never true  Transportation Needs: No Transportation Needs  . Lack of Transportation (Medical): No  . Lack of Transportation (Non-Medical): No  Physical Activity: Inactive  . Days of Exercise per Week: 0 days  . Minutes of Exercise per Session: 0 min  Stress: No Stress Concern Present  . Feeling of Stress : Only a little  Social Connections: Moderately Isolated  . Frequency of Communication with Friends and Family: More than three times a week  . Frequency of Social Gatherings with Friends and Family: Twice a week  . Attends Religious Services: 1 to 4 times per year  . Active Member of Clubs or Organizations: No  . Attends Archivist Meetings: Never  . Marital Status: Widowed    Activities of Daily Living In your present state of health, do you have any difficulty performing the following activities: 04/10/2020  Hearing? N  Vision? Y  Difficulty concentrating or making  decisions? N  Walking or climbing stairs? N  Dressing or bathing? N  Doing errands, shopping? N  Preparing Food and eating ? N  Using the Toilet? N  In the  past six months, have you accidently leaked urine? N  Do you have problems with loss of bowel control? N  Managing your Medications? N  Managing your Finances? N  Housekeeping or managing your Housekeeping? N  Some recent data might be hidden    Patient Education/ Literacy How often do you need to have someone help you when you read instructions, pamphlets, or other written materials from your doctor or pharmacy?: 1 - Never What is the last grade level you completed in school?: 12  Exercise Current Exercise Habits: The patient does not participate in regular exercise at present, Exercise limited by: None identified  Diet Patient reports consuming 3 meals a day and 2 snack(s) a day Patient reports that her primary diet is: Diabetic Patient reports that she does have regular access to food.   Depression Screen PHQ 2/9 Scores 04/10/2020 07/09/2019 06/11/2019 03/28/2019 12/05/2018 12/05/2018 08/28/2018  PHQ - 2 Score 2 1 0 0 2 2 1   PHQ- 9 Score 5 - - - 4 - -     Fall Risk Fall Risk  04/10/2020 07/09/2019 03/28/2019 12/05/2018 08/28/2018  Falls in the past year? 0 0 0 0 0  Risk for fall due to : - - - - -  Risk for fall due to: Comment - - - - -     Objective:  [name] seemed alert and oriented and she participated appropriately during our telephone visit.  Blood Pressure Weight BMI  BP Readings from Last 3 Encounters:  10/15/19 135/70  08/07/19 124/74  08/03/19 (!) 174/64   Wt Readings from Last 3 Encounters:  08/03/19 156 lb 4.8 oz (70.9 kg)  07/09/19 156 lb (70.8 kg)  06/11/19 153 lb (69.4 kg)   BMI Readings from Last 1 Encounters:  08/03/19 27.69 kg/m    *Unable to obtain current vital signs, weight, and BMI due to telephone visit type  Hearing/Vision  . [place] did not seem to have difficulty with  hearing/understanding during the telephone conversation . Reports that she has had a formal eye exam by an eye care professional within the past year . Reports that she has not had a formal hearing evaluation within the past year *Unable to fully assess hearing and vision during telephone visit type  Cognitive Function: 6CIT Screen 04/10/2020 03/28/2019  What Year? 0 points 0 points  What month? 0 points 0 points  What time? 0 points 0 points  Count back from 20 0 points 0 points  Months in reverse 0 points 0 points  Repeat phrase 0 points 0 points  Total Score 0 0   (Normal:0-7, Significant for Dysfunction: >8)  Normal Cognitive Function Screening: Yes   Immunization & Health Maintenance Record Immunization History  Administered Date(s) Administered  . Fluad Quad(high Dose 65+) 07/09/2019  . Influenza, High Dose Seasonal PF 07/19/2016, 10/13/2017, 08/28/2018  . Influenza,inj,Quad PF,6+ Mos 07/30/2013, 07/24/2014, 08/04/2015  . Moderna SARS-COVID-2 Vaccination 11/29/2019, 12/31/2019  . Pneumococcal Conjugate-13 11/27/2014  . Pneumococcal Polysaccharide-23 10/22/2009  . Td 10/25/2004  . Tdap 11/27/2014  . Zoster 04/06/2012    Health Maintenance  Topic Date Due  . Hepatitis C Screening  Never done  . MAMMOGRAM  05/31/2018  . HEMOGLOBIN A1C  02/26/2019  . URINE MICROALBUMIN  05/27/2019  . FOOT EXAM  08/29/2019  . OPHTHALMOLOGY EXAM  10/31/2019  . INFLUENZA VACCINE  05/25/2020  . DEXA SCAN  05/29/2021  . TETANUS/TDAP  11/27/2024  . COVID-19 Vaccine  Completed  . PNA vac Low  Risk Adult  Completed       Assessment  This is a routine wellness examination for [facility].  Health Maintenance: Due or Overdue Health Maintenance Due  Topic Date Due  . Hepatitis C Screening  Never done  . MAMMOGRAM  05/31/2018  . HEMOGLOBIN A1C  02/26/2019  . URINE MICROALBUMIN  05/27/2019  . FOOT EXAM  08/29/2019  . OPHTHALMOLOGY EXAM  10/31/2019    [name] does not need a  referral for Community Assistance: Care Management:   no Social Work:    no Prescription Assistance:  no Nutrition/Diabetes Education:  no   Plan:  Personalized Goals Goals Addressed            This Visit's Progress   . Exercise 3x per week (30 min per time)   Not on track   . Prevent falls        Personalized Health Maintenance & Screening Recommendations  Screening mammography  Lung Cancer Screening Recommended: no (Low Dose CT Chest recommended if Age 63-80 years, 30 pack-year currently smoking OR have quit w/in past 15 years) Hepatitis C Screening recommended: no HIV Screening recommended: no  Advanced Directives: Written information was not prepared per patient's request.  Referrals & Orders No orders of the defined types were placed in this encounter.   Follow-up Plan . Follow-up with [name], DO as planned . Scheduled appt for 05/02/20 . Pt has not been in office due to covid pandemic. Marland Kitchen Pt needs a mammogram, she is aware . All routine labs and test will be done at next visit. A1C, urine microalbumin, foot exam. . Patient had diabetic eye exam in 12/2019 with Dr. Marin Comment. Record request sent. . Pt Denies hearing or vision difficulties. . Covid vaccine complete. . Pt remains very independent with all ADL's. . AVS printed and mailed to pt   I have personally reviewed and noted the following in the patient's chart:   . Medical and social history . Use of alcohol, tobacco or illicit drugs  . Current medications and supplements . Functional ability and status . Nutritional status . Physical activity . Advanced directives . List of other physicians . Hospitalizations, surgeries, and ER visits in previous 12 months . Vitals . Screenings to include cognitive, depression, and falls . Referrals and appointments  In addition, I have reviewed and discussed with [name] certain preventive protocols, quality metrics, and best practice recommendations.  A written personalized care plan for preventive services as well as general preventive health recommendations is available and can be mailed to the patient at her request.      Sharee Holster   [id number]

## 2020-04-14 DIAGNOSIS — N3020 Other chronic cystitis without hematuria: Secondary | ICD-10-CM | POA: Diagnosis not present

## 2020-04-22 ENCOUNTER — Telehealth: Payer: Self-pay | Admitting: Family Medicine

## 2020-04-22 DIAGNOSIS — F3289 Other specified depressive episodes: Secondary | ICD-10-CM

## 2020-04-22 MED ORDER — ESCITALOPRAM OXALATE 20 MG PO TABS: 20.0000 mg | ORAL_TABLET | Freq: Every day | ORAL | 0 refills | Status: DC

## 2020-04-22 NOTE — Telephone Encounter (Signed)
°  Prescription Request  04/22/2020  What is the name of the medication or equipment? escitalopram (LEXAPRO) 20 MG tablet    Have you contacted your pharmacy to request a refill? (if applicable) yes  Which pharmacy would you like this sent to? [facility] in [facility]   Patient notified that their request is being sent to the clinical staff for review and that they should receive a response within 2 business days.

## 2020-04-22 NOTE — Telephone Encounter (Signed)
Patient aware, per message left on voice mail,   script is ready but will need appointment with provider for further refills.

## 2020-04-22 NOTE — Telephone Encounter (Signed)
30 day supply sent in [facility] for further refills

## 2020-04-22 NOTE — Addendum Note (Signed)
Addended by: Milas Hock on: 04/22/2020 03:33 PM   Modules accepted: Orders

## 2020-05-09 ENCOUNTER — Encounter: Payer: Self-pay | Admitting: Family Medicine

## 2020-05-09 ENCOUNTER — Ambulatory Visit (INDEPENDENT_AMBULATORY_CARE_PROVIDER_SITE_OTHER): Payer: Medicare Other | Admitting: Family Medicine

## 2020-05-09 ENCOUNTER — Other Ambulatory Visit: Payer: Self-pay

## 2020-05-09 VITALS — BP 120/60 | HR 74 | Temp 97.6°F | Ht 63.0 in | Wt 148.0 lb

## 2020-05-09 DIAGNOSIS — Z794 Long term (current) use of insulin: Secondary | ICD-10-CM

## 2020-05-09 DIAGNOSIS — R634 Abnormal weight loss: Secondary | ICD-10-CM | POA: Diagnosis not present

## 2020-05-09 DIAGNOSIS — E1169 Type 2 diabetes mellitus with other specified complication: Secondary | ICD-10-CM

## 2020-05-09 DIAGNOSIS — F3289 Other specified depressive episodes: Secondary | ICD-10-CM

## 2020-05-09 DIAGNOSIS — E1121 Type 2 diabetes mellitus with diabetic nephropathy: Secondary | ICD-10-CM

## 2020-05-09 DIAGNOSIS — I10 Essential (primary) hypertension: Secondary | ICD-10-CM

## 2020-05-09 DIAGNOSIS — E1159 Type 2 diabetes mellitus with other circulatory complications: Secondary | ICD-10-CM

## 2020-05-09 DIAGNOSIS — E785 Hyperlipidemia, unspecified: Secondary | ICD-10-CM | POA: Diagnosis not present

## 2020-05-09 DIAGNOSIS — N1832 Chronic kidney disease, stage 3b: Secondary | ICD-10-CM | POA: Diagnosis not present

## 2020-05-09 NOTE — Progress Notes (Signed)
Subjective: CC: DM PCP: [name], DO EVO:JJKKX C Douthat is a 79 y.o. female presenting to clinic today for:  1. Type 2 Diabetes w/ HTN and HLD.  She has known CKD 3. Patient reports FBG 86-195.  Seeing endocrinology q6 months. Taking medication(s): Lantus 38H QAM and Trulicity 8.2XH q week, and atorvastatin 20 mg daily.  Her Novolog SSI was adjusted due to increased BGs.   Last eye exam: Dr Zigmund Daniel in [facility], retinal specialist q6 months. Last foot exam: Done with Dr Steffanie Dunn Last A1c: 7.9 on 04/01/2020 w/ Dr Steffanie Dunn Nephropathy screen indicated?: Done 07/2019 microalbuminuria present, ACE-I dc'd by Endocrinology for hypotension Last flu, zoster and/or pneumovax:  UTD Immunization History  Administered Date(s) Administered  . Fluad Quad(high Dose 65+) 07/09/2019  . Influenza, High Dose Seasonal PF 07/19/2016, 10/13/2017, 08/28/2018  . Influenza,inj,Quad PF,6+ Mos 07/30/2013, 07/24/2014, 08/04/2015  . Moderna SARS-COVID-2 Vaccination 11/29/2019, 12/31/2019  . Pneumococcal Conjugate-13 11/27/2014  . Pneumococcal Polysaccharide-23 10/22/2009  . Td 10/25/2004  . Tdap 11/27/2014  . Zoster 04/06/2012   ROS: denies dizziness, LOC, polyuria, polydipsia, unintended weight loss/gain, foot ulcerations or chest pain.  She reports chronic numbness or tingling in bilateral hands and feet that is stable.  2. Depression/ Insomnia Patient reports no exacerbation of symptoms.  They are well controlled on Trazodone and lexapro.  She needs refills of both.  3.  Weight loss Patient reports that she is dropped about 10 pounds since this time last year.  She denies any early satiety, night sweats, chills.  No hemoptysis, change in breathing.  She feels that she is eating her normal amounts.  ROS: Per HPI  Allergies  Allergen Reactions  . Latex Anaphylaxis and Rash  . Ambien [Zolpidem] Other (See Comments)    Hallucinations  . Augmentin [Amoxicillin-Pot Clavulanate] Nausea And Vomiting  .  Belsomra [Suvorexant] Other (See Comments)    Hallucinations  . Hydrocodone-Acetaminophen Other (See Comments)    hallucinations  . Hyoscyamine Sulfate Other (See Comments)  . Melatonin Other (See Comments)    Adverse reaction-"keeps me awake"  . Mobic [Meloxicam]   . Morphine Nausea And Vomiting    REACTION: hallucinations,GI upset  . Oxycodone Other (See Comments)    Hallucinations  . Tramadol     Unknown reaction  . Celebrex [Celecoxib] Itching and Rash  . Sulfa Antibiotics Rash   Past Medical History:  Diagnosis Date  . Acute cystitis   . Anxiety   . Depression   . Diabetes mellitus type II   . Diabetic retinopathy   . DM type 2 causing CKD stage 3 ([place])   . Fibromyalgia   . GERD (gastroesophageal reflux disease)   . Hyperlipidemia   . Hypertension   . IBS (irritable bowel syndrome)   . IBS (irritable bowel syndrome)   . Insomnia   . Lichen planus    vulvar  . Pancreatitis, gallstone   . Postmenopausal   . Radial scar of breast 06/04/2016  . Urge incontinence of urine     Current Outpatient Medications:  .  acetaminophen (TYLENOL) 325 MG tablet, Take 325 mg by mouth as needed., Disp: , Rfl:  .  amLODipine (NORVASC) 5 MG tablet, Take 1 tablet (5 mg total) by mouth daily., Disp: 90 tablet, Rfl: 3 .  aspirin 81 MG tablet, Take 81 mg by mouth every other day. , Disp: , Rfl:  .  atorvastatin (LIPITOR) 10 MG tablet, Take 1 tablet (10 mg total) by mouth at bedtime., Disp: 90 tablet,  Rfl: 3 .  Calcium Carb-Cholecalciferol (CALCIUM 600+D3) 600-200 MG-UNIT TABS, Take 1 tablet by mouth 2 (two) times daily.  , Disp: , Rfl:  .  cephALEXin (KEFLEX) 500 MG capsule, Take 500 mg by mouth daily. For UTI prevention, Disp: , Rfl:  .  desonide (DESOWEN) 0.05 % cream, Apply topically 2 (two) times daily., Disp: 30 g, Rfl: 1 .  diclofenac sodium (VOLTAREN) 1 % GEL, Apply 4 g topically 4 (four) times daily., Disp: 400 g, Rfl: 3 .  Dulaglutide (TRULICITY) 1.5 IW/8.0HO SOPN, Inject 1.5 mg  into the skin once a week., Disp: 12 mL, Rfl: 1 .  escitalopram (LEXAPRO) 20 MG tablet, Take 1 tablet (20 mg total) by mouth daily. Needs appointment for further refills, Disp: 30 tablet, Rfl: 0 .  esomeprazole (NEXIUM) 20 MG capsule, Take 1 capsule (20 mg total) by mouth daily at 12 noon., Disp: 90 capsule, Rfl: 3 .  estradiol (ESTRACE) 0.1 MG/GM vaginal cream, Place 2 g vaginally every Monday, Wednesday, and Friday., Disp: 42.5 g, Rfl: 1 .  fluticasone (FLONASE) 50 MCG/ACT nasal spray, Place 2 sprays into both nostrils daily. (Patient not taking: Reported on 08/03/2019), Disp: 16 g, Rfl: 6 .  glucose blood (FREESTYLE LITE) test strip, Use qid to check blood sugar. Dx E11.9, Disp: 200 each, Rfl: 11 .  insulin aspart (NOVOLOG FLEXPEN) 100 UNIT/ML FlexPen, BG less than 80 - no insulin; 80 to 120 - 3 units; 121 to 150 - 4 units; 151 to 200 - 5 units; 201 to 250 - 6 units; 250 or above - 7 units (Patient not taking: Reported on 08/03/2019), Disp: 30 mL, Rfl: 3 .  Insulin Glargine (LANTUS SOLOSTAR) 100 UNIT/ML Solostar Pen, Inject 26 Units into the skin every morning., Disp: 45 mL, Rfl: 1 .  Insulin Pen Needle 31G X 5 MM MISC, 1 Device by Does not apply route 4 (four) times daily., Disp: 300 each, Rfl: 4 .  meclizine (ANTIVERT) 25 MG tablet, Take 1 tablet (25 mg total) by mouth 3 (three) times daily as needed for dizziness. (Patient not taking: Reported on 08/03/2019), Disp: 30 tablet, Rfl: 0 .  mirabegron ER (MYRBETRIQ) 50 MG TB24 tablet, Take 50 mg by mouth daily., Disp: , Rfl:  .  Multiple Vitamin (MULTIVITAMIN) tablet, Take 1 tablet by mouth daily. , Disp: , Rfl:  .  nystatin (NYSTATIN) powder, APPLY UNDER BILATERAL BREASTS AND GROIN TWICE A DAY (If needed), Disp: 60 g, Rfl: 1 .  nystatin-triamcinolone ointment (MYCOLOG), APPLY TOPICALLY 2 (TWO) TIMES DAILY IF NEEDED FOR rash.  NOT A PREVENTATIVE MEDICATION., Disp: 60 g, Rfl: 3 .  Polyethyl Glycol-Propyl Glycol 0.4-0.3 % SOLN, Apply 1 drop to eye daily as  needed (for dry eye relief). , Disp: , Rfl:  .  polyethylene glycol (MIRALAX / GLYCOLAX) packet, Take 17 g by mouth daily as needed. , Disp: , Rfl:  .  Probiotic Product (PROBIOTIC ADVANCED PO), Take 1 capsule by mouth daily. , Disp: , Rfl:  .  traZODone (DESYREL) 150 MG tablet, Take 0.5 tablets (75 mg total) by mouth at bedtime., Disp: 45 tablet, Rfl: 1 Social History   Socioeconomic History  . Marital status: Widowed    Spouse name: Not on file  . Number of children: 3  . Years of education: 36  . Highest education level: Some college, no degree  Occupational History  . Occupation: retired    Fish farm manager: RETIRED    Comment: bookeeping  Tobacco Use  . Smoking status: Never  Smoker  . Smokeless tobacco: Never Used  Vaping Use  . Vaping Use: Never used  Substance and Sexual Activity  . Alcohol use: No  . Drug use: No  . Sexual activity: Not Currently  Other Topics Concern  . Not on file  Social History Narrative   Widowed in 2010   Social Determinants of Health   Financial Resource Strain:   . Difficulty of Paying Living Expenses:   Food Insecurity: No Food Insecurity  . Worried About Charity fundraiser in the Last Year: Never true  . Ran Out of Food in the Last Year: Never true  Transportation Needs: No Transportation Needs  . Lack of Transportation (Medical): No  . Lack of Transportation (Non-Medical): No  Physical Activity: Inactive  . Days of Exercise per Week: 0 days  . Minutes of Exercise per Session: 0 min  Stress: No Stress Concern Present  . Feeling of Stress : Only a little  Social Connections: Moderately Isolated  . Frequency of Communication with Friends and Family: More than three times a week  . Frequency of Social Gatherings with Friends and Family: Twice a week  . Attends Religious Services: 1 to 4 times per year  . Active Member of Clubs or Organizations: No  . Attends Archivist Meetings: Never  . Marital Status: Widowed  Intimate Partner  Violence:   . Fear of Current or Ex-Partner:   . Emotionally Abused:   Marland Kitchen Physically Abused:   . Sexually Abused:    Family History  Problem Relation Age of Onset  . Diabetes Mother   . Hypertension Mother   . Kidney disease Mother   . Heart disease Mother   . Peripheral vascular disease Mother        amputation  . Cancer Father        prostate  . Diabetes Father   . Heart disease Father        CABG  . Hypertension Father   . Heart attack Father   . Depression Sister   . Hypertension Sister   . Neuropathy Sister     Objective: Office vital signs reviewed. BP 120/60   Pulse 74   Temp 97.6 F (36.4 C) (Temporal)   Ht 5' 3"  (1.6 m)   Wt 148 lb (67.1 kg)   SpO2 97%   BMI 26.22 kg/m   Physical Examination:  General: Awake, alert, well nourished, No acute distress HEENT: Normal, MMM Cardio: regular rate and rhythm, S1S2 heard, no murmurs appreciated Pulm: clear to auscultation bilaterally, no wheezes, rhonchi or rales; normal work of breathing on room air Extremities: warm, well perfused, No edema, cyanosis or clubbing; +2 pulses bilaterally MSK: Ambulating independently.    Psych: Mood stable, speech normal  Depression screen [facility] 2/9 05/09/2020 04/10/2020 07/09/2019  Decreased Interest 1 1 0  Down, Depressed, Hopeless 1 1 1   PHQ - 2 Score 2 2 1   Altered sleeping 0 0 -  Tired, decreased energy 1 1 -  Change in appetite 0 1 -  Feeling bad or failure about yourself  0 0 -  Trouble concentrating 0 1 -  Moving slowly or fidgety/restless 0 0 -  Suicidal thoughts 0 0 -  PHQ-9 Score 3 5 -  Difficult doing work/chores Not difficult at all Somewhat difficult -  Some recent data might be hidden   Assessment/ Plan: 79 y.o. female   1. Type 2 diabetes mellitus with stage 3b chronic kidney disease, with long-term current use  of insulin ([place]) A1c slightly up last visit with endo.  Continue regimen as prescribed.  2. Hypertension associated with diabetes ([place]) Controlled  on recheck  3. Hyperlipidemia associated with type 2 diabetes mellitus ([place]) Continue statin  4. Unexplained weight loss ~8lb weight loss since September.  Look for metabolic etiology.  NO red flags otherwise.  Recheck weight in 8 weeks.  If persistent loss, need to consider imaging. - CMP14+EGFR - Thyroid Panel With TSH - CBC with Differential  5. Other depression Stable - escitalopram (LEXAPRO) 20 MG tablet; Take 1 tablet (20 mg total) by mouth daily.  Dispense: 90 tablet; Refill: 3   No orders of the defined types were placed in this encounter.  No orders of the defined types were placed in this encounter.    [name], DO [place] [phone]

## 2020-05-09 NOTE — Patient Instructions (Signed)

## 2020-05-10 LAB — CBC WITH DIFFERENTIAL/PLATELET
Basophils Absolute: 0 10*3/uL (ref 0.0–0.2)
Basos: 1 %
EOS (ABSOLUTE): 0.1 10*3/uL (ref 0.0–0.4)
Eos: 3 %
Hematocrit: 41.4 % (ref 34.0–46.6)
Hemoglobin: 13.8 g/dL (ref 11.1–15.9)
Immature Grans (Abs): 0 10*3/uL (ref 0.0–0.1)
Immature Granulocytes: 0 %
Lymphocytes Absolute: 1.7 10*3/uL (ref 0.7–3.1)
Lymphs: 36 %
MCH: 31.7 pg (ref 26.6–33.0)
MCHC: 33.3 g/dL (ref 31.5–35.7)
MCV: 95 fL (ref 79–97)
Monocytes Absolute: 0.5 10*3/uL (ref 0.1–0.9)
Monocytes: 10 %
Neutrophils Absolute: 2.5 10*3/uL (ref 1.4–7.0)
Neutrophils: 50 %
Platelets: 168 10*3/uL (ref 150–450)
RBC: 4.36 x10E6/uL (ref 3.77–5.28)
RDW: 12.6 % (ref 11.7–15.4)
WBC: 4.8 10*3/uL (ref 3.4–10.8)

## 2020-05-10 LAB — CMP14+EGFR
ALT: 21 IU/L (ref 0–32)
AST: 29 IU/L (ref 0–40)
Albumin/Globulin Ratio: 1.9 (ref 1.2–2.2)
Albumin: 4.1 g/dL (ref 3.7–4.7)
Alkaline Phosphatase: 57 IU/L (ref 48–121)
BUN/Creatinine Ratio: 16 (ref 12–28)
BUN: 17 mg/dL (ref 8–27)
Bilirubin Total: 0.3 mg/dL (ref 0.0–1.2)
CO2: 29 mmol/L (ref 20–29)
Calcium: 9.6 mg/dL (ref 8.7–10.3)
Chloride: 96 mmol/L (ref 96–106)
Creatinine, Ser: 1.09 mg/dL — ABNORMAL HIGH (ref 0.57–1.00)
GFR calc Af Amer: 56 mL/min/{1.73_m2} — ABNORMAL LOW (ref >59)
GFR calc non Af Amer: 48 mL/min/{1.73_m2} — ABNORMAL LOW (ref >59)
Globulin, Total: 2.2 g/dL (ref 1.5–4.5)
Glucose: 202 mg/dL — ABNORMAL HIGH (ref 65–99)
Potassium: 4.5 mmol/L (ref 3.5–5.2)
Sodium: 137 mmol/L (ref 134–144)
Total Protein: 6.3 g/dL (ref 6.0–8.5)

## 2020-05-10 LAB — THYROID PANEL WITH TSH
Free Thyroxine Index: 2.2 (ref 1.2–4.9)
T3 Uptake Ratio: 32 % (ref 24–39)
T4, Total: 6.8 ug/dL (ref 4.5–12.0)
TSH: 1.79 u[IU]/mL (ref 0.450–4.500)

## 2020-05-12 MED ORDER — TRAZODONE HCL 150 MG PO TABS: 75.0000 mg | ORAL_TABLET | Freq: Every day | ORAL | 3 refills | Status: DC

## 2020-05-12 MED ORDER — ESCITALOPRAM OXALATE 20 MG PO TABS: 20.0000 mg | ORAL_TABLET | Freq: Every day | ORAL | 3 refills | Status: DC

## 2020-05-12 MED ORDER — TRULICITY 1.5 MG/0.5ML ~~LOC~~ SOAJ: 1.5000 mg | SUBCUTANEOUS | 3 refills | Status: DC

## 2020-05-29 DIAGNOSIS — B351 Tinea unguium: Secondary | ICD-10-CM | POA: Diagnosis not present

## 2020-05-29 DIAGNOSIS — E1142 Type 2 diabetes mellitus with diabetic polyneuropathy: Secondary | ICD-10-CM | POA: Diagnosis not present

## 2020-05-29 DIAGNOSIS — L84 Corns and callosities: Secondary | ICD-10-CM | POA: Diagnosis not present

## 2020-05-29 DIAGNOSIS — M79676 Pain in unspecified toe(s): Secondary | ICD-10-CM | POA: Diagnosis not present

## 2020-06-04 ENCOUNTER — Other Ambulatory Visit: Payer: Self-pay

## 2020-06-04 ENCOUNTER — Encounter (INDEPENDENT_AMBULATORY_CARE_PROVIDER_SITE_OTHER): Payer: Medicare Other | Admitting: Ophthalmology

## 2020-06-04 DIAGNOSIS — I10 Essential (primary) hypertension: Secondary | ICD-10-CM

## 2020-06-04 DIAGNOSIS — H43813 Vitreous degeneration, bilateral: Secondary | ICD-10-CM

## 2020-06-04 DIAGNOSIS — E11311 Type 2 diabetes mellitus with unspecified diabetic retinopathy with macular edema: Secondary | ICD-10-CM | POA: Diagnosis not present

## 2020-06-04 DIAGNOSIS — E113391 Type 2 diabetes mellitus with moderate nonproliferative diabetic retinopathy without macular edema, right eye: Secondary | ICD-10-CM

## 2020-06-04 DIAGNOSIS — H35033 Hypertensive retinopathy, bilateral: Secondary | ICD-10-CM

## 2020-06-04 DIAGNOSIS — E113312 Type 2 diabetes mellitus with moderate nonproliferative diabetic retinopathy with macular edema, left eye: Secondary | ICD-10-CM | POA: Diagnosis not present

## 2020-06-11 ENCOUNTER — Other Ambulatory Visit: Payer: Self-pay

## 2020-06-11 ENCOUNTER — Ambulatory Visit (INDEPENDENT_AMBULATORY_CARE_PROVIDER_SITE_OTHER): Payer: Medicare Other | Admitting: Family Medicine

## 2020-06-11 VITALS — BP 120/71 | HR 72 | Temp 98.4°F | Ht 63.0 in | Wt 143.0 lb

## 2020-06-11 DIAGNOSIS — R238 Other skin changes: Secondary | ICD-10-CM

## 2020-06-11 DIAGNOSIS — R233 Spontaneous ecchymoses: Secondary | ICD-10-CM

## 2020-06-11 DIAGNOSIS — M7989 Other specified soft tissue disorders: Secondary | ICD-10-CM | POA: Diagnosis not present

## 2020-06-11 NOTE — Progress Notes (Signed)
Subjective: CC: KNot/bruising PCP: [name], DO HWE:[id number] [name] is a 79 y.o. female presenting to clinic today for:  1.  Mass/bruising Patient reports that her son noted some bruising on the posterior aspect of her right knee.  She denies any preceding injury.  She does have a history of easy bruising.  She is under the care of hematology.  She subsequently felt a palpable knot on the posterior knee.  She does not report any pain in the calves, lower extremity edema, pain with ambulation.    ROS: Per HPI  Allergies  Allergen Reactions  . Latex Anaphylaxis and Rash  . Ambien [Zolpidem] Other (See Comments)    Hallucinations  . Augmentin [Amoxicillin-Pot Clavulanate] Nausea And Vomiting  . Belsomra [Suvorexant] Other (See Comments)    Hallucinations  . Hydrocodone-Acetaminophen Other (See Comments)    hallucinations  . Hyoscyamine Sulfate Other (See Comments)  . Melatonin Other (See Comments)    Adverse reaction-"keeps me awake"  . Mobic [Meloxicam]   . Morphine Nausea And Vomiting    REACTION: hallucinations,GI upset  . Oxycodone Other (See Comments)    Hallucinations  . Tramadol     Unknown reaction  . Celebrex [Celecoxib] Itching and Rash  . Sulfa Antibiotics Rash   Past Medical History:  Diagnosis Date  . Acute cystitis   . Anxiety   . Depression   . Diabetes mellitus type II   . Diabetic retinopathy   . DM type 2 causing CKD stage 3 ([place])   . Fibromyalgia   . GERD (gastroesophageal reflux disease)   . Hyperlipidemia   . Hypertension   . IBS (irritable bowel syndrome)   . IBS (irritable bowel syndrome)   . Insomnia   . Lichen planus    vulvar  . Pancreatitis, gallstone   . Postmenopausal   . Radial scar of breast 06/04/2016  . Urge incontinence of urine     Current Outpatient Medications:  .  acetaminophen (TYLENOL) 325 MG tablet, Take 325 mg by mouth as needed., Disp: , Rfl:  .  amLODipine (NORVASC) 5 MG tablet, Take 1 tablet (5 mg total)  by mouth daily., Disp: 90 tablet, Rfl: 3 .  aspirin 81 MG tablet, Take 81 mg by mouth every other day. , Disp: , Rfl:  .  atorvastatin (LIPITOR) 10 MG tablet, Take 1 tablet (10 mg total) by mouth at bedtime., Disp: 90 tablet, Rfl: 3 .  Calcium Carb-Cholecalciferol (CALCIUM 600+D3) 600-200 MG-UNIT TABS, Take 1 tablet by mouth 2 (two) times daily.  , Disp: , Rfl:  .  cephALEXin (KEFLEX) 500 MG capsule, Take 500 mg by mouth daily. For UTI prevention, Disp: , Rfl:  .  desonide (DESOWEN) 0.05 % cream, Apply topically 2 (two) times daily., Disp: 30 g, Rfl: 1 .  diclofenac sodium (VOLTAREN) 1 % GEL, Apply 4 g topically 4 (four) times daily., Disp: 400 g, Rfl: 3 .  Dulaglutide (TRULICITY) 1.5 IR/6.7EL SOPN, Inject 0.5 mLs (1.5 mg total) into the skin once a week., Disp: 12 pen, Rfl: 3 .  escitalopram (LEXAPRO) 20 MG tablet, Take 1 tablet (20 mg total) by mouth daily., Disp: 90 tablet, Rfl: 3 .  esomeprazole (NEXIUM) 20 MG capsule, Take 1 capsule (20 mg total) by mouth daily at 12 noon., Disp: 90 capsule, Rfl: 3 .  estradiol (ESTRACE) 0.1 MG/GM vaginal cream, Place 2 g vaginally every Monday, Wednesday, and Friday., Disp: 42.5 g, Rfl: 1 .  fluticasone (FLONASE) 50 MCG/ACT nasal spray, Place  2 sprays into both nostrils daily., Disp: 16 g, Rfl: 6 .  glucose blood (FREESTYLE LITE) test strip, Use qid to check blood sugar. Dx E11.9, Disp: 200 each, Rfl: 11 .  insulin aspart (NOVOLOG FLEXPEN) 100 UNIT/ML FlexPen, BG less than 80 - no insulin; 80 to 120 - 3 units; 121 to 150 - 4 units; 151 to 200 - 5 units; 201 to 250 - 6 units; 250 or above - 7 units, Disp: 30 mL, Rfl: 3 .  Insulin Glargine (LANTUS SOLOSTAR) 100 UNIT/ML Solostar Pen, Inject 26 Units into the skin every morning. (Patient taking differently: Inject 22 Units into the skin every morning. ), Disp: 45 mL, Rfl: 1 .  Insulin Pen Needle 31G X 5 MM MISC, 1 Device by Does not apply route 4 (four) times daily., Disp: 300 each, Rfl: 4 .  meclizine (ANTIVERT)  25 MG tablet, Take 1 tablet (25 mg total) by mouth 3 (three) times daily as needed for dizziness., Disp: 30 tablet, Rfl: 0 .  mirabegron ER (MYRBETRIQ) 50 MG TB24 tablet, Take 50 mg by mouth daily., Disp: , Rfl:  .  Multiple Vitamin (MULTIVITAMIN) tablet, Take 1 tablet by mouth daily. , Disp: , Rfl:  .  nystatin (NYSTATIN) powder, APPLY UNDER BILATERAL BREASTS AND GROIN TWICE A DAY (If needed), Disp: 60 g, Rfl: 1 .  nystatin-triamcinolone ointment (MYCOLOG), APPLY TOPICALLY 2 (TWO) TIMES DAILY IF NEEDED FOR rash.  NOT A PREVENTATIVE MEDICATION., Disp: 60 g, Rfl: 3 .  Polyethyl Glycol-Propyl Glycol 0.4-0.3 % SOLN, Apply 1 drop to eye daily as needed (for dry eye relief). , Disp: , Rfl:  .  polyethylene glycol (MIRALAX / GLYCOLAX) packet, Take 17 g by mouth daily as needed. , Disp: , Rfl:  .  Probiotic Product (PROBIOTIC ADVANCED PO), Take 1 capsule by mouth daily. , Disp: , Rfl:  .  traZODone (DESYREL) 150 MG tablet, Take 0.5 tablets (75 mg total) by mouth at bedtime., Disp: 45 tablet, Rfl: 3 Social History   Socioeconomic History  . Marital status: Widowed    Spouse name: Not on file  . Number of children: 3  . Years of education: 16  . Highest education level: Some college, no degree  Occupational History  . Occupation: retired    Fish farm manager: RETIRED    Comment: bookeeping  Tobacco Use  . Smoking status: Never Smoker  . Smokeless tobacco: Never Used  Vaping Use  . Vaping Use: Never used  Substance and Sexual Activity  . Alcohol use: No  . Drug use: No  . Sexual activity: Not Currently  Other Topics Concern  . Not on file  Social History Narrative   Widowed in 2010   Social Determinants of Health   Financial Resource Strain:   . Difficulty of Paying Living Expenses:   Food Insecurity: No Food Insecurity  . Worried About Charity fundraiser in the Last Year: Never true  . Ran Out of Food in the Last Year: Never true  Transportation Needs: No Transportation Needs  . Lack of  Transportation (Medical): No  . Lack of Transportation (Non-Medical): No  Physical Activity: Inactive  . Days of Exercise per Week: 0 days  . Minutes of Exercise per Session: 0 min  Stress: No Stress Concern Present  . Feeling of Stress : Only a little  Social Connections: Moderately Isolated  . Frequency of Communication with Friends and Family: More than three times a week  . Frequency of Social Gatherings with Friends and  Family: Twice a week  . Attends Religious Services: 1 to 4 times per year  . Active Member of Clubs or Organizations: No  . Attends Archivist Meetings: Never  . Marital Status: Widowed  Intimate Partner Violence:   . Fear of Current or Ex-Partner:   . Emotionally Abused:   Marland Kitchen Physically Abused:   . Sexually Abused:    Family History  Problem Relation Age of Onset  . Diabetes Mother   . Hypertension Mother   . Kidney disease Mother   . Heart disease Mother   . Peripheral vascular disease Mother        amputation  . Cancer Father        prostate  . Diabetes Father   . Heart disease Father        CABG  . Hypertension Father   . Heart attack Father   . Depression Sister   . Hypertension Sister   . Neuropathy Sister     Objective: Office vital signs reviewed. BP 120/71   Pulse 72   Temp 98.4 F (36.9 [name]) (Temporal)   Ht 5\' 3"  (1.6 m)   Wt 143 lb (64.9 kg)   SpO2 97%   BMI 25.33 kg/m   Physical Examination:  General: Awake, alert, well nourished, No acute distress MSK: Gumball sized soft tissue mass noted in the posterior right knee.  This is nontender but somewhat firmer than I would expect.  She does have quite a bit of ecchymosis on the right posterior but bruising is actually notable in the left posterior medial knee as well. Extremities: No edema.  No increased warmth.  Negative Homans.  No palpable cord  Assessment/ Plan: 79 y.o. female   1. Abnormal bruising CBC was normal 1 month ago will repeat given spontaneous bruising  and posterior knees.  I went through her history quite a bit today and she could not remember having injured the back of the knees.  There is no specific pattern to suggest that this was from the seat or object.  I do not think that this is an acute DVT but do question possible aneurysm.  Given the palpable knot and bruising, I would like her to have both vascular and soft tissue ultrasound.  We will make plans pending these results - CBC with Differential/Platelet - [place] RT LOWER EXTREM LTD SOFT TISSUE NON VASCULAR; Future - US Venous Img Lower Unilateral Right; Future  2. Soft tissue mass - CBC with Differential/Platelet - [place] RT LOWER EXTREM LTD SOFT TISSUE NON VASCULAR; Future - US Venous Img Lower Unilateral Right; Future   No orders of the defined types were placed in this encounter.  No orders of the defined types were placed in this encounter.    [name], DO [place] [phone]

## 2020-06-12 LAB — CBC WITH DIFFERENTIAL/PLATELET
Basophils Absolute: 0 10*3/uL (ref 0.0–0.2)
Basos: 1 %
EOS (ABSOLUTE): 0.1 10*3/uL (ref 0.0–0.4)
Eos: 2 %
Hematocrit: 41.5 % (ref 34.0–46.6)
Hemoglobin: 14 g/dL (ref 11.1–15.9)
Immature Grans (Abs): 0 10*3/uL (ref 0.0–0.1)
Immature Granulocytes: 0 %
Lymphocytes Absolute: 1.4 10*3/uL (ref 0.7–3.1)
Lymphs: 27 %
MCH: 32.4 pg (ref 26.6–33.0)
MCHC: 33.7 g/dL (ref 31.5–35.7)
MCV: 96 fL (ref 79–97)
Monocytes Absolute: 0.4 10*3/uL (ref 0.1–0.9)
Monocytes: 9 %
Neutrophils Absolute: 3.1 10*3/uL (ref 1.4–7.0)
Neutrophils: 61 %
Platelets: 159 10*3/uL (ref 150–450)
RBC: 4.32 x10E6/uL (ref 3.77–5.28)
RDW: 12.2 % (ref 11.7–15.4)
WBC: 5 10*3/uL (ref 3.4–10.8)

## 2020-06-17 ENCOUNTER — Telehealth: Payer: Self-pay | Admitting: Family Medicine

## 2020-06-18 ENCOUNTER — Ambulatory Visit
Admission: RE | Admit: 2020-06-18 | Discharge: 2020-06-18 | Disposition: A | Discharge status: Home / Self Care | Payer: Medicare Other | Source: Ambulatory Visit | Attending: Family Medicine | Admitting: Family Medicine

## 2020-06-18 DIAGNOSIS — R2241 Localized swelling, mass and lump, right lower limb: Secondary | ICD-10-CM | POA: Diagnosis not present

## 2020-06-18 DIAGNOSIS — M7989 Other specified soft tissue disorders: Secondary | ICD-10-CM

## 2020-06-18 DIAGNOSIS — R233 Spontaneous ecchymoses: Secondary | ICD-10-CM

## 2020-07-02 DIAGNOSIS — Z794 Long term (current) use of insulin: Secondary | ICD-10-CM | POA: Diagnosis not present

## 2020-07-02 DIAGNOSIS — E1140 Type 2 diabetes mellitus with diabetic neuropathy, unspecified: Secondary | ICD-10-CM | POA: Diagnosis not present

## 2020-07-14 ENCOUNTER — Other Ambulatory Visit: Payer: Self-pay | Admitting: Family Medicine

## 2020-07-14 DIAGNOSIS — E1159 Type 2 diabetes mellitus with other circulatory complications: Secondary | ICD-10-CM

## 2020-07-14 DIAGNOSIS — K219 Gastro-esophageal reflux disease without esophagitis: Secondary | ICD-10-CM

## 2020-07-14 DIAGNOSIS — N183 Chronic kidney disease, stage 3 unspecified: Secondary | ICD-10-CM

## 2020-07-14 MED ORDER — AMLODIPINE BESYLATE 5 MG PO TABS: 5.0000 mg | ORAL_TABLET | Freq: Every day | ORAL | 3 refills | Status: DC

## 2020-07-14 MED ORDER — ESOMEPRAZOLE MAGNESIUM 20 MG PO CPDR: 20.0000 mg | DELAYED_RELEASE_CAPSULE | Freq: Every day | ORAL | 3 refills | Status: DC

## 2020-07-14 MED ORDER — LANTUS SOLOSTAR 100 UNIT/ML ~~LOC~~ SOPN: 26.0000 [IU] | PEN_INJECTOR | SUBCUTANEOUS | 3 refills | Status: DC

## 2020-07-14 NOTE — Telephone Encounter (Signed)
  Prescription Request  07/14/2020  What is the name of the medication or equipment? Nexium-20mg , Norvask-5mg , Lantus  Have you contacted your pharmacy to request a refill? (if applicable) No  Which pharmacy would you like this sent to? [facility]   Patient notified that their request is being sent to the clinical staff for review and that they should receive a response within 2 business days.   [name] pt.  90 day supply with 3 refills  Please call pt.

## 2020-07-25 ENCOUNTER — Ambulatory Visit: Payer: Medicare Other | Admitting: Family Medicine

## 2020-08-07 ENCOUNTER — Other Ambulatory Visit (HOSPITAL_COMMUNITY): Payer: Medicare Other

## 2020-08-14 ENCOUNTER — Ambulatory Visit (HOSPITAL_COMMUNITY): Payer: Medicare Other | Admitting: Nurse Practitioner

## 2020-08-28 DIAGNOSIS — M79676 Pain in unspecified toe(s): Secondary | ICD-10-CM | POA: Diagnosis not present

## 2020-08-28 DIAGNOSIS — B351 Tinea unguium: Secondary | ICD-10-CM | POA: Diagnosis not present

## 2020-08-28 DIAGNOSIS — E1142 Type 2 diabetes mellitus with diabetic polyneuropathy: Secondary | ICD-10-CM | POA: Diagnosis not present

## 2020-08-28 DIAGNOSIS — L84 Corns and callosities: Secondary | ICD-10-CM | POA: Diagnosis not present

## 2020-09-08 ENCOUNTER — Other Ambulatory Visit: Payer: Self-pay

## 2020-09-08 ENCOUNTER — Ambulatory Visit (INDEPENDENT_AMBULATORY_CARE_PROVIDER_SITE_OTHER): Payer: Medicare Other

## 2020-09-08 DIAGNOSIS — Z23 Encounter for immunization: Secondary | ICD-10-CM

## 2020-09-10 ENCOUNTER — Other Ambulatory Visit: Payer: Self-pay

## 2020-09-10 DIAGNOSIS — E785 Hyperlipidemia, unspecified: Secondary | ICD-10-CM

## 2020-09-10 MED ORDER — ATORVASTATIN CALCIUM 10 MG PO TABS: 10.0000 mg | ORAL_TABLET | Freq: Every day | ORAL | 0 refills | Status: DC

## 2020-09-10 NOTE — Telephone Encounter (Signed)
  Prescription Request  09/10/2020  What is the name of the medication or equipment? lipitor with 3 month refill  Have you contacted your pharmacy to request a refill? (if applicable) no  [facility]   Patient notified that their request is being sent to the clinical staff for review and that they should receive a response within 2 business days.

## 2020-09-10 NOTE — Telephone Encounter (Signed)
Pt aware refill sent to pharmacy 

## 2020-09-13 DIAGNOSIS — Z23 Encounter for immunization: Secondary | ICD-10-CM | POA: Diagnosis not present

## 2020-09-30 ENCOUNTER — Telehealth: Payer: Self-pay

## 2020-09-30 NOTE — Telephone Encounter (Signed)
  Prescription Request  09/30/2020  What is the name of the medication or equipment? Trazodone  Have you contacted your pharmacy to request a refill? (if applicable) yes  Which pharmacy would you like this sent to? [facility]   Patient notified that their request is being sent to the clinical staff for review and that they should receive a response within 2 business days.   [name] pt.  She wants 30 day supply sent in. (She wants to know, if [facility] will increase meds--I told her that she will probably have to be seen for that) She has an appt in December with her.  Please call pt.

## 2020-09-30 NOTE — Telephone Encounter (Signed)
Script was sent on 05/12/20 #45 with 3 refills Patient was sent last refill in September, instructed her to contact [facility] that she should have 3 refills on file with them.

## 2020-10-14 ENCOUNTER — Ambulatory Visit (INDEPENDENT_AMBULATORY_CARE_PROVIDER_SITE_OTHER): Payer: Medicare Other | Admitting: Family Medicine

## 2020-10-14 ENCOUNTER — Other Ambulatory Visit: Payer: Self-pay

## 2020-10-14 VITALS — Wt 148.0 lb

## 2020-10-14 DIAGNOSIS — E1122 Type 2 diabetes mellitus with diabetic chronic kidney disease: Secondary | ICD-10-CM | POA: Diagnosis not present

## 2020-10-14 DIAGNOSIS — E1169 Type 2 diabetes mellitus with other specified complication: Secondary | ICD-10-CM | POA: Diagnosis not present

## 2020-10-14 DIAGNOSIS — Z794 Long term (current) use of insulin: Secondary | ICD-10-CM | POA: Diagnosis not present

## 2020-10-14 DIAGNOSIS — N1832 Chronic kidney disease, stage 3b: Secondary | ICD-10-CM

## 2020-10-14 DIAGNOSIS — I152 Hypertension secondary to endocrine disorders: Secondary | ICD-10-CM

## 2020-10-14 DIAGNOSIS — F5101 Primary insomnia: Secondary | ICD-10-CM | POA: Diagnosis not present

## 2020-10-14 DIAGNOSIS — R059 Cough, unspecified: Secondary | ICD-10-CM | POA: Diagnosis not present

## 2020-10-14 DIAGNOSIS — E1159 Type 2 diabetes mellitus with other circulatory complications: Secondary | ICD-10-CM | POA: Diagnosis not present

## 2020-10-14 DIAGNOSIS — E785 Hyperlipidemia, unspecified: Secondary | ICD-10-CM

## 2020-10-14 MED ORDER — TRAZODONE HCL 100 MG PO TABS: 100.0000 mg | ORAL_TABLET | Freq: Every day | ORAL | 0 refills | Status: DC

## 2020-10-14 NOTE — Progress Notes (Signed)
Telephone visit  Subjective: CC: DM PCP: [name], DO [id number] [name] is a 79 y.o. female calls for telephone consult today. Patient provides verbal consent for consult held via phone.  Due to COVID-19 pandemic this visit was conducted virtually. This visit type was conducted due to national recommendations for restrictions regarding the COVID-19 Pandemic (e.g. social distancing, sheltering in place) in an effort to limit this patient's exposure and mitigate transmission in our community. All issues noted in this document were discussed and addressed.  A physical exam was not performed with this format.   Location of patient: home Location of provider: [facility] Others present for call: none  1. Type 2 Diabetes with hypertension, hyperlipidemia:  Blood sugars have been well controlled.  She is compliant with her medications.  Last OV with her endocrinologist demonstrate controlled type 2 diabetes with A1c down to 6.8.  2. Cough Patient reports mild cough that started in the last few days.  No reports of fever, hemoptysis or shortness of breath.  She has been vaccinated against COVID.  No known sick contacts  3.  Insomnia Patient reports insomnia has not been well controlled despite compliance with 75 mg of trazodone.  She continues to take Lexapro 20 mg daily.  ROS: Per HPI  Allergies  Allergen Reactions  . Latex Anaphylaxis and Rash  . Ambien [Zolpidem] Other (See Comments)    Hallucinations  . Augmentin [Amoxicillin-Pot Clavulanate] Nausea And Vomiting  . Belsomra [Suvorexant] Other (See Comments)    Hallucinations  . Hydrocodone-Acetaminophen Other (See Comments)    hallucinations  . Hyoscyamine Sulfate Other (See Comments)  . Melatonin Other (See Comments)    Adverse reaction-"keeps me awake"  . Mobic [Meloxicam]   . Morphine Nausea And Vomiting    REACTION: hallucinations,GI upset  . Oxycodone Other (See Comments)    Hallucinations  . Tramadol     Unknown  reaction  . Celebrex [Celecoxib] Itching and Rash  . Sulfa Antibiotics Rash   Past Medical History:  Diagnosis Date  . Acute cystitis   . Anxiety   . Depression   . Diabetes mellitus type II   . Diabetic retinopathy   . DM type 2 causing CKD stage 3 ([place])   . Fibromyalgia   . GERD (gastroesophageal reflux disease)   . Hyperlipidemia   . Hypertension   . IBS (irritable bowel syndrome)   . IBS (irritable bowel syndrome)   . Insomnia   . Lichen planus    vulvar  . Pancreatitis, gallstone   . Postmenopausal   . Radial scar of breast 06/04/2016  . Urge incontinence of urine     Current Outpatient Medications:  .  acetaminophen (TYLENOL) 325 MG tablet, Take 325 mg by mouth as needed., Disp: , Rfl:  .  amLODipine (NORVASC) 5 MG tablet, Take 1 tablet (5 mg total) by mouth daily., Disp: 90 tablet, Rfl: 3 .  aspirin 81 MG tablet, Take 81 mg by mouth every other day. , Disp: , Rfl:  .  atorvastatin (LIPITOR) 10 MG tablet, Take 1 tablet (10 mg total) by mouth at bedtime., Disp: 90 tablet, Rfl: 0 .  Calcium Carb-Cholecalciferol (CALCIUM 600+D3) 600-200 MG-UNIT TABS, Take 1 tablet by mouth 2 (two) times daily.  , Disp: , Rfl:  .  cephALEXin (KEFLEX) 500 MG capsule, Take 500 mg by mouth daily. For UTI prevention, Disp: , Rfl:  .  desonide (DESOWEN) 0.05 % cream, Apply topically 2 (two) times daily., Disp: 30 g, Rfl: 1 .  diclofenac sodium (VOLTAREN) 1 % GEL, Apply 4 g topically 4 (four) times daily., Disp: 400 g, Rfl: 3 .  Dulaglutide (TRULICITY) 1.5 0000000 SOPN, Inject 0.5 mLs (1.5 mg total) into the skin once a week., Disp: 12 pen, Rfl: 3 .  escitalopram (LEXAPRO) 20 MG tablet, Take 1 tablet (20 mg total) by mouth daily., Disp: 90 tablet, Rfl: 3 .  esomeprazole (NEXIUM) 20 MG capsule, Take 1 capsule (20 mg total) by mouth daily at 12 noon., Disp: 90 capsule, Rfl: 3 .  estradiol (ESTRACE) 0.1 MG/GM vaginal cream, Place 2 g vaginally every Monday, Wednesday, and Friday., Disp: 42.5 g, Rfl:  1 .  fluticasone (FLONASE) 50 MCG/ACT nasal spray, Place 2 sprays into both nostrils daily., Disp: 16 g, Rfl: 6 .  glucose blood (FREESTYLE LITE) test strip, Use qid to check blood sugar. Dx E11.9, Disp: 200 each, Rfl: 11 .  insulin aspart (NOVOLOG FLEXPEN) 100 UNIT/ML FlexPen, BG less than 80 - no insulin; 80 to 120 - 3 units; 121 to 150 - 4 units; 151 to 200 - 5 units; 201 to 250 - 6 units; 250 or above - 7 units, Disp: 30 mL, Rfl: 3 .  insulin glargine (LANTUS SOLOSTAR) 100 UNIT/ML Solostar Pen, Inject 26 Units into the skin every morning., Disp: 45 mL, Rfl: 3 .  Insulin Pen Needle 31G X 5 MM MISC, 1 Device by Does not apply route 4 (four) times daily., Disp: 300 each, Rfl: 4 .  meclizine (ANTIVERT) 25 MG tablet, Take 1 tablet (25 mg total) by mouth 3 (three) times daily as needed for dizziness., Disp: 30 tablet, Rfl: 0 .  mirabegron ER (MYRBETRIQ) 50 MG TB24 tablet, Take 50 mg by mouth daily., Disp: , Rfl:  .  Multiple Vitamin (MULTIVITAMIN) tablet, Take 1 tablet by mouth daily. , Disp: , Rfl:  .  nystatin (NYSTATIN) powder, APPLY UNDER BILATERAL BREASTS AND GROIN TWICE A DAY (If needed), Disp: 60 g, Rfl: 1 .  nystatin-triamcinolone ointment (MYCOLOG), APPLY TOPICALLY 2 (TWO) TIMES DAILY IF NEEDED FOR rash.  NOT A PREVENTATIVE MEDICATION., Disp: 60 g, Rfl: 3 .  Polyethyl Glycol-Propyl Glycol 0.4-0.3 % SOLN, Apply 1 drop to eye daily as needed (for dry eye relief). , Disp: , Rfl:  .  polyethylene glycol (MIRALAX / GLYCOLAX) packet, Take 17 g by mouth daily as needed. , Disp: , Rfl:  .  Probiotic Product (PROBIOTIC ADVANCED PO), Take 1 capsule by mouth daily. , Disp: , Rfl:  .  traZODone (DESYREL) 150 MG tablet, Take 0.5 tablets (75 mg total) by mouth at bedtime., Disp: 45 tablet, Rfl: 3  Assessment/ Plan: 79 y.o. female   Primary insomnia - Plan: traZODone (DESYREL) 100 MG tablet  Cough - Plan: Novel Coronavirus, NAA ([facility])  Type 2 diabetes mellitus with stage 3b chronic kidney disease,  with long-term current use of insulin ([place]) - Plan: Bayer DCA Hb A1c Waived, Renal Function Panel  Hypertension associated with diabetes ([place]) - Plan: Renal Function Panel  Hyperlipidemia associated with type 2 diabetes mellitus ([place]) - Plan: Lipid panel  Insomnia is not well controlled at this time.  We will gradually advance the trazodone.  Discussed risk of serotonin syndrome given use of Lexapro.  She is not exhibiting any other symptoms or signs but is aware of that risk.  It was increased to 100 mg.  New prescription sent to pharmacy.  Uncertain as to where the cough is coming from.  Of course there is the omicron strain of  COVID-19 so breakthroughs have been more prevalent despite vaccination.  She will come for drive up Covid testing this week.  Her blood sugar was well controlled with A1c down to 6.8 at her last visit with Dr. Steffanie Dunn.  She can feel free to come in to get renal function testing and A1c repeated at her earliest convenience when she is feeling well.  Start time: 1:01 pm End time: 1:08pm  Total time spent on patient care (including telephone call/ virtual visit): 7 minutes  [place], [place] [phone]

## 2020-10-15 ENCOUNTER — Other Ambulatory Visit (HOSPITAL_COMMUNITY): Payer: Medicare Other

## 2020-10-16 DIAGNOSIS — R059 Cough, unspecified: Secondary | ICD-10-CM | POA: Diagnosis not present

## 2020-10-16 NOTE — Addendum Note (Signed)
Addended by: Liliane Bade on: 10/16/2020 11:20 AM   Modules accepted: Orders

## 2020-10-18 LAB — NOVEL CORONAVIRUS, NAA: SARS-CoV-2, NAA: NOT DETECTED

## 2020-10-18 LAB — SARS-COV-2, NAA 2 DAY TAT

## 2020-10-20 ENCOUNTER — Ambulatory Visit (INDEPENDENT_AMBULATORY_CARE_PROVIDER_SITE_OTHER): Payer: Medicare Other | Admitting: Family Medicine

## 2020-10-20 ENCOUNTER — Encounter: Payer: Self-pay | Admitting: Family Medicine

## 2020-10-20 DIAGNOSIS — J0100 Acute maxillary sinusitis, unspecified: Secondary | ICD-10-CM | POA: Diagnosis not present

## 2020-10-20 MED ORDER — LEVOFLOXACIN 500 MG PO TABS: 500.0000 mg | ORAL_TABLET | Freq: Every day | ORAL | 0 refills | Status: DC

## 2020-10-20 NOTE — Progress Notes (Signed)
Subjective:    Patient ID: [name], female    DOB: 06-26-1941, 79 y.o.   MRN: [id number]   HPI: [name] is a 79 y.o. female presenting for  Follow up of CoVID sx. Had test on 12/23 that was negative. Cough,  sore throat and nasal congestion. Denies fever. No improvement since she saw Dr. Darnell Level on 12/21.  Yellow, mucid drainage and rhinorrhea. Throat is sore. Cough.    Depression screen [facility] 2/9 06/11/2020 05/09/2020 04/10/2020 07/09/2019 06/11/2019  Decreased Interest 0 1 1 0 0  Down, Depressed, Hopeless 1 1 1 1  0  PHQ - 2 Score 1 2 2 1  0  Altered sleeping 1 0 0 - -  Tired, decreased energy 1 1 1  - -  Change in appetite 0 0 1 - -  Feeling bad or failure about yourself  0 0 0 - -  Trouble concentrating 0 0 1 - -  Moving slowly or fidgety/restless 0 0 0 - -  Suicidal thoughts 0 0 0 - -  PHQ-9 Score 3 3 5  - -  Difficult doing work/chores Not difficult at all Not difficult at all Somewhat difficult - -  Some recent data might be hidden     Relevant past medical, surgical, family and social history reviewed and updated as indicated.  Interim medical history since our last visit reviewed. Allergies and medications reviewed and updated.  ROS:  Review of Systems  Constitutional: Negative for activity change, appetite change, chills and fever.  HENT: Positive for congestion, postnasal drip, rhinorrhea and sinus pressure. Negative for ear discharge, ear pain, hearing loss, nosebleeds, sneezing and trouble swallowing.   Respiratory: Positive for cough. Negative for chest tightness and shortness of breath.   Cardiovascular: Negative for chest pain and palpitations.  Skin: Negative for rash.     Social History   Tobacco Use  Smoking Status Never Smoker  Smokeless Tobacco Never Used       Objective:     Wt Readings from Last 3 Encounters:  10/14/20 148 lb (67.1 kg)  06/11/20 143 lb (64.9 kg)  05/09/20 148 lb (67.1 kg)     Exam deferred. Pt. Harboring due to COVID 19.  Phone visit performed.   Assessment & Plan:   1. Acute maxillary sinusitis, recurrence not specified     Meds ordered this encounter  Medications  . levofloxacin (LEVAQUIN) 500 MG tablet    Sig: Take 1 tablet (500 mg total) by mouth daily. For 10 days    Dispense:  10 tablet    Refill:  0    No orders of the defined types were placed in this encounter.     Diagnoses and all orders for this visit:  Acute maxillary sinusitis, recurrence not specified  Other orders -     levofloxacin (LEVAQUIN) 500 MG tablet; Take 1 tablet (500 mg total) by mouth daily. For 10 days    Virtual Visit via telephone Note  I discussed the limitations, risks, security and privacy concerns of performing an evaluation and management service by telephone and the availability of in person appointments. The patient was identified with two identifiers. Pt.expressed understanding and agreed to proceed. Pt. Is at home. Dr. Livia Snellen is in his office.  Follow Up Instructions:   I discussed the assessment and treatment plan with the patient. The patient was provided an opportunity to ask questions and all were answered. The patient agreed with the plan and demonstrated an understanding of the instructions.  The patient was advised to call back or seek an in-person evaluation if the symptoms worsen or if the condition fails to improve as anticipated.   Total minutes including chart review and phone contact time: 12   Follow up plan: No follow-ups on file.  Mechele Claude, MD [place] [facility]

## 2020-10-22 ENCOUNTER — Ambulatory Visit (HOSPITAL_COMMUNITY): Payer: Medicare Other | Admitting: Oncology

## 2020-10-23 ENCOUNTER — Ambulatory Visit (HOSPITAL_COMMUNITY): Payer: Medicare Other | Admitting: Oncology

## 2020-11-03 ENCOUNTER — Other Ambulatory Visit: Payer: Medicare Other

## 2020-11-03 DIAGNOSIS — Z20822 Contact with and (suspected) exposure to covid-19: Secondary | ICD-10-CM

## 2020-11-04 LAB — NOVEL CORONAVIRUS, NAA: SARS-CoV-2, NAA: NOT DETECTED

## 2020-11-04 LAB — SARS-COV-2, NAA 2 DAY TAT

## 2020-11-25 DIAGNOSIS — N898 Other specified noninflammatory disorders of vagina: Secondary | ICD-10-CM | POA: Diagnosis not present

## 2020-11-25 DIAGNOSIS — Z01419 Encounter for gynecological examination (general) (routine) without abnormal findings: Secondary | ICD-10-CM | POA: Diagnosis not present

## 2020-11-25 DIAGNOSIS — N909 Noninflammatory disorder of vulva and perineum, unspecified: Secondary | ICD-10-CM | POA: Diagnosis not present

## 2020-11-25 DIAGNOSIS — Z1231 Encounter for screening mammogram for malignant neoplasm of breast: Secondary | ICD-10-CM | POA: Diagnosis not present

## 2020-12-02 ENCOUNTER — Telehealth: Payer: Self-pay

## 2020-12-02 DIAGNOSIS — E785 Hyperlipidemia, unspecified: Secondary | ICD-10-CM

## 2020-12-02 MED ORDER — ATORVASTATIN CALCIUM 10 MG PO TABS: 10.0000 mg | ORAL_TABLET | Freq: Every day | ORAL | 1 refills | Status: DC

## 2020-12-02 NOTE — Telephone Encounter (Signed)
  Prescription Request  12/02/2020  What is the name of the medication or equipment? Lipitor  Have you contacted your pharmacy to request a refill? (if applicable) Yes  Which pharmacy would you like this sent to? [facility]  Pt scheduled to see Dr Lajuana Ripple for check up/med refill on 12/15/20. Will run out of her lipitor Rx before her appt and needs refill called in ASAP.

## 2020-12-02 NOTE — Telephone Encounter (Signed)
Pt aware refill sent to pharmacy 

## 2020-12-05 ENCOUNTER — Encounter (INDEPENDENT_AMBULATORY_CARE_PROVIDER_SITE_OTHER): Payer: Medicare Other | Admitting: Ophthalmology

## 2020-12-12 ENCOUNTER — Encounter (INDEPENDENT_AMBULATORY_CARE_PROVIDER_SITE_OTHER): Payer: Medicare Other | Admitting: Ophthalmology

## 2020-12-15 ENCOUNTER — Encounter: Payer: Self-pay | Admitting: Family Medicine

## 2020-12-15 ENCOUNTER — Ambulatory Visit (INDEPENDENT_AMBULATORY_CARE_PROVIDER_SITE_OTHER): Payer: Medicare Other | Admitting: Family Medicine

## 2020-12-15 ENCOUNTER — Other Ambulatory Visit: Payer: Self-pay

## 2020-12-15 VITALS — BP 127/62 | HR 73 | Temp 97.0°F | Ht 63.0 in | Wt 151.0 lb

## 2020-12-15 DIAGNOSIS — J392 Other diseases of pharynx: Secondary | ICD-10-CM | POA: Diagnosis not present

## 2020-12-15 DIAGNOSIS — Z658 Other specified problems related to psychosocial circumstances: Secondary | ICD-10-CM

## 2020-12-15 DIAGNOSIS — E1122 Type 2 diabetes mellitus with diabetic chronic kidney disease: Secondary | ICD-10-CM | POA: Diagnosis not present

## 2020-12-15 DIAGNOSIS — E1169 Type 2 diabetes mellitus with other specified complication: Secondary | ICD-10-CM | POA: Diagnosis not present

## 2020-12-15 DIAGNOSIS — E785 Hyperlipidemia, unspecified: Secondary | ICD-10-CM

## 2020-12-15 DIAGNOSIS — I152 Hypertension secondary to endocrine disorders: Secondary | ICD-10-CM | POA: Diagnosis not present

## 2020-12-15 DIAGNOSIS — N3281 Overactive bladder: Secondary | ICD-10-CM | POA: Diagnosis not present

## 2020-12-15 DIAGNOSIS — N1831 Chronic kidney disease, stage 3a: Secondary | ICD-10-CM

## 2020-12-15 DIAGNOSIS — Z794 Long term (current) use of insulin: Secondary | ICD-10-CM

## 2020-12-15 DIAGNOSIS — E1159 Type 2 diabetes mellitus with other circulatory complications: Secondary | ICD-10-CM | POA: Diagnosis not present

## 2020-12-15 LAB — BAYER DCA HB A1C WAIVED: HB A1C (BAYER DCA - WAIVED): 7.4 % — ABNORMAL HIGH (ref <7.0)

## 2020-12-15 NOTE — Progress Notes (Signed)
Subjective: CC: DM PCP: [name], DO [facility]:[name] is a 80 y.o. female presenting to clinic today for:  1. Type 2 Diabetes with hypertension, hyperlipidemia, CKD3a:  Managed by Dr Steffanie Dunn.  Last OV 06/2020.  Next OV 12/2020.  She is been compliant with her medications but admits that she has had a couple of hypoglycemic episodes in the morning where her sugars have gone down as far as 68s.  This has not been frequent.  She was not sure if perhaps she just was not eating enough with her suppers.  She has been purposefully eating toast with peanut butter in efforts to prevent hypoglycemic episodes in the morning.  Last eye exam: Due Last foot exam: UTD Last A1c:  Lab Results  Component Value Date   HGBA1C 6.5 08/28/2018   Nephropathy screen indicated?: UTD 06/2020 w/ Steffanie Dunn Last flu, zoster and/or pneumovax:  Immunization History  Administered Date(s) Administered  . Fluad Quad(high Dose 65+) 07/09/2019, 09/08/2020  . Influenza, High Dose Seasonal PF 07/19/2016, 10/13/2017, 08/28/2018  . Influenza,inj,Quad PF,6+ Mos 07/30/2013, 07/24/2014, 08/04/2015  . Moderna Sars-Covid-2 Vaccination 11/29/2019, 12/31/2019  . Pneumococcal Conjugate-13 11/27/2014  . Pneumococcal Polysaccharide-23 10/22/2009  . Td 10/25/2004  . Tdap 11/27/2014  . Zoster 04/06/2012    2.  Depression/ loneliness Patient reports feeling lonely.  She admits that she does not leave her home much.  She enjoys things like puzzles and crosswords.  She is not a part of any women's groups she plans on taking piano lessons soon as she recently got 1 for Christmas.  She spends time with her granddaughter and daughter sometimes.  3.  Urinary frequency Patient reports nocturia.  She sees Dr. Matilde Sprang at [facility] urology but has not had an appointment recently.  She is on Myrbetriq 50 mg but again continues to have quite a bit of urine output.  No hematuria, dysuria or pelvic pain.  ROS: Per HPI  Allergies   Allergen Reactions  . Latex Anaphylaxis and Rash  . Ambien [Zolpidem] Other (See Comments)    Hallucinations  . Augmentin [Amoxicillin-Pot Clavulanate] Nausea And Vomiting  . Belsomra [Suvorexant] Other (See Comments)    Hallucinations  . Hydrocodone-Acetaminophen Other (See Comments)    hallucinations  . Hyoscyamine Sulfate Other (See Comments)  . Melatonin Other (See Comments)    Adverse reaction-"keeps me awake"  . Mobic [Meloxicam]   . Morphine Nausea And Vomiting    REACTION: hallucinations,GI upset  . Oxycodone Other (See Comments)    Hallucinations  . Tramadol     Unknown reaction  . Celebrex [Celecoxib] Itching and Rash  . Sulfa Antibiotics Rash   Past Medical History:  Diagnosis Date  . Acute cystitis   . Anxiety   . Depression   . Diabetes mellitus type II   . Diabetic retinopathy   . DM type 2 causing CKD stage 3 ([place])   . Fibromyalgia   . GERD (gastroesophageal reflux disease)   . Hyperlipidemia   . Hypertension   . IBS (irritable bowel syndrome)   . IBS (irritable bowel syndrome)   . Insomnia   . Lichen planus    vulvar  . Pancreatitis, gallstone   . Postmenopausal   . Radial scar of breast 06/04/2016  . Urge incontinence of urine     Current Outpatient Medications:  .  acetaminophen (TYLENOL) 325 MG tablet, Take 325 mg by mouth as needed., Disp: , Rfl:  .  amLODipine (NORVASC) 5 MG tablet, Take 1 tablet (5  mg total) by mouth daily., Disp: 90 tablet, Rfl: 3 .  aspirin 81 MG tablet, Take 81 mg by mouth every other day. , Disp: , Rfl:  .  atorvastatin (LIPITOR) 10 MG tablet, Take 1 tablet (10 mg total) by mouth at bedtime., Disp: 90 tablet, Rfl: 1 .  Calcium Carb-Cholecalciferol (CALCIUM 600+D3) 600-200 MG-UNIT TABS, Take 1 tablet by mouth 2 (two) times daily.  , Disp: , Rfl:  .  cephALEXin (KEFLEX) 500 MG capsule, Take 500 mg by mouth daily. For UTI prevention, Disp: , Rfl:  .  desonide (DESOWEN) 0.05 % cream, Apply topically 2 (two) times daily.,  Disp: 30 g, Rfl: 1 .  diclofenac sodium (VOLTAREN) 1 % GEL, Apply 4 g topically 4 (four) times daily., Disp: 400 g, Rfl: 3 .  Dulaglutide (TRULICITY) 1.5 BO/1.7PZ SOPN, Inject 0.5 mLs (1.5 mg total) into the skin once a week., Disp: 12 pen, Rfl: 3 .  escitalopram (LEXAPRO) 20 MG tablet, Take 1 tablet (20 mg total) by mouth daily., Disp: 90 tablet, Rfl: 3 .  esomeprazole (NEXIUM) 20 MG capsule, Take 1 capsule (20 mg total) by mouth daily at 12 noon., Disp: 90 capsule, Rfl: 3 .  estradiol (ESTRACE) 0.1 MG/GM vaginal cream, Place 2 g vaginally every Monday, Wednesday, and Friday., Disp: 42.5 g, Rfl: 1 .  fluticasone (FLONASE) 50 MCG/ACT nasal spray, Place 2 sprays into both nostrils daily., Disp: 16 g, Rfl: 6 .  glucose blood (FREESTYLE LITE) test strip, Use qid to check blood sugar. Dx E11.9, Disp: 200 each, Rfl: 11 .  insulin aspart (NOVOLOG FLEXPEN) 100 UNIT/ML FlexPen, BG less than 80 - no insulin; 80 to 120 - 3 units; 121 to 150 - 4 units; 151 to 200 - 5 units; 201 to 250 - 6 units; 250 or above - 7 units, Disp: 30 mL, Rfl: 3 .  insulin glargine (LANTUS SOLOSTAR) 100 UNIT/ML Solostar Pen, Inject 26 Units into the skin every morning., Disp: 45 mL, Rfl: 3 .  Insulin Pen Needle 31G X 5 MM MISC, 1 Device by Does not apply route 4 (four) times daily., Disp: 300 each, Rfl: 4 .  meclizine (ANTIVERT) 25 MG tablet, Take 1 tablet (25 mg total) by mouth 3 (three) times daily as needed for dizziness., Disp: 30 tablet, Rfl: 0 .  mirabegron ER (MYRBETRIQ) 50 MG TB24 tablet, Take 50 mg by mouth daily., Disp: , Rfl:  .  Multiple Vitamin (MULTIVITAMIN) tablet, Take 1 tablet by mouth daily. , Disp: , Rfl:  .  nystatin (NYSTATIN) powder, APPLY UNDER BILATERAL BREASTS AND GROIN TWICE A DAY (If needed), Disp: 60 g, Rfl: 1 .  nystatin-triamcinolone ointment (MYCOLOG), APPLY TOPICALLY 2 (TWO) TIMES DAILY IF NEEDED FOR rash.  NOT A PREVENTATIVE MEDICATION., Disp: 60 g, Rfl: 3 .  Polyethyl Glycol-Propyl Glycol 0.4-0.3 %  SOLN, Apply 1 drop to eye daily as needed (for dry eye relief). , Disp: , Rfl:  .  polyethylene glycol (MIRALAX / GLYCOLAX) packet, Take 17 g by mouth daily as needed. , Disp: , Rfl:  .  Probiotic Product (PROBIOTIC ADVANCED PO), Take 1 capsule by mouth daily. , Disp: , Rfl:  .  traZODone (DESYREL) 100 MG tablet, Take 1 tablet (100 mg total) by mouth at bedtime., Disp: 90 tablet, Rfl: 0 Social History   Socioeconomic History  . Marital status: Widowed    Spouse name: Not on file  . Number of children: 3  . Years of education: 19  . Highest education level:  Some college, no degree  Occupational History  . Occupation: retired    Fish farm manager: RETIRED    Comment: bookeeping  Tobacco Use  . Smoking status: Never Smoker  . Smokeless tobacco: Never Used  Vaping Use  . Vaping Use: Never used  Substance and Sexual Activity  . Alcohol use: No  . Drug use: No  . Sexual activity: Not Currently  Other Topics Concern  . Not on file  Social History Narrative   Widowed in 2010   Social Determinants of Health   Financial Resource Strain: Not on file  Food Insecurity: No Food Insecurity  . Worried About Charity fundraiser in the Last Year: Never true  . Ran Out of Food in the Last Year: Never true  Transportation Needs: No Transportation Needs  . Lack of Transportation (Medical): No  . Lack of Transportation (Non-Medical): No  Physical Activity: Inactive  . Days of Exercise per Week: 0 days  . Minutes of Exercise per Session: 0 min  Stress: No Stress Concern Present  . Feeling of Stress : Only a little  Social Connections: Moderately Isolated  . Frequency of Communication with Friends and Family: More than three times a week  . Frequency of Social Gatherings with Friends and Family: Twice a week  . Attends Religious Services: 1 to 4 times per year  . Active Member of Clubs or Organizations: No  . Attends Archivist Meetings: Never  . Marital Status: Widowed  Intimate Partner  Violence: Not on file   Family History  Problem Relation Age of Onset  . Diabetes Mother   . Hypertension Mother   . Kidney disease Mother   . Heart disease Mother   . Peripheral vascular disease Mother        amputation  . Cancer Father        prostate  . Diabetes Father   . Heart disease Father        CABG  . Hypertension Father   . Heart attack Father   . Depression Sister   . Hypertension Sister   . Neuropathy Sister     Objective: Office vital signs reviewed. BP 127/62   Pulse 73   Temp (!) 97 F (36.1 C) (Temporal)   Ht 5\' 3"  (1.6 m)   Wt 151 lb (68.5 kg)   SpO2 99%   BMI 26.75 kg/m   Physical Examination:  General: Awake, alert, well nourished, No acute distress HEENT: Normal, sclera white, moist mucous membranes.  She does have mild cobblestone appearance of the oropharynx Cardio: regular rate and rhythm, S1S2 heard, no murmurs appreciated Pulm: clear to auscultation bilaterally, no wheezes, rhonchi or rales; normal work of breathing on room air Extremities: warm, well perfused, No edema, cyanosis or clubbing; +2 pulses bilaterally MSK: Ambulating independently Psych: Mood is stable, speech is normal, affect is appropriate  Depression screen [facility] 2/9 12/15/2020 06/11/2020 05/09/2020  Decreased Interest 1 0 1  Down, Depressed, Hopeless 3 1 1   PHQ - 2 Score 4 1 2   Altered sleeping 0 1 0  Tired, decreased energy 1 1 1   Change in appetite 0 0 0  Feeling bad or failure about yourself  0 0 0  Trouble concentrating 1 0 0  Moving slowly or fidgety/restless 0 0 0  Suicidal thoughts 0 0 0  PHQ-9 Score 6 3 3   Difficult doing work/chores Not difficult at all Not difficult at all Not difficult at all  Some recent data might be hidden  GAD 7 : Generalized Anxiety Score 12/05/2018  Nervous, Anxious, on Edge 1  Control/stop worrying 1  Worry too much - different things 1  Trouble relaxing 1  Restless 0  Easily annoyed or irritable 0  Afraid - awful might happen 0   Total GAD 7 Score 4    Assessment/ Plan: 80 y.o. female   Type 2 diabetes mellitus with stage 3a chronic kidney disease, with long-term current use of insulin (HCC) - Plan: Bayer DCA Hb A1c Waived, Renal Function Panel, VITAMIN D 25 Hydroxy (Vit-D Deficiency, Fractures)  Hypertension associated with diabetes ([place])  Hyperlipidemia associated with type 2 diabetes mellitus ([place])  Feeling lonely  Throat irritation  OAB (overactive bladder)  Sugars not at goal.  I have CCed her lab results to Dr. Steffanie Dunn.  I hesitate to advance any of her medications given intermittent hypoglycemic episodes.  If anything we may need to consider de-escalating medicine.  Her blood pressure is well controlled.  Continue current regimen  Continue statin  We discussed ways to increase socialization without having to be directly in person with someone.  We talked about online gaming services and Facebook groups in efforts to increase socialization and improve loneliness.  I think that starting the piano lessons will also improve her spirit.  She will continue taking the Lexapro as prescribed and I will see if we can get virtual behavioral health reach out to her to assist with other strategies.  On max dose of Myrbetriq.  Intolerant to Enablex previously.  Follow-up with urology  No orders of the defined types were placed in this encounter.  No orders of the defined types were placed in this encounter.    [name], DO [place] [phone]

## 2020-12-16 ENCOUNTER — Telehealth: Payer: Self-pay | Admitting: Licensed Clinical Social Worker

## 2020-12-16 LAB — RENAL FUNCTION PANEL
Albumin: 3.9 g/dL (ref 3.7–4.7)
BUN/Creatinine Ratio: 21 (ref 12–28)
BUN: 24 mg/dL (ref 8–27)
CO2: 27 mmol/L (ref 20–29)
Calcium: 9.3 mg/dL (ref 8.7–10.3)
Chloride: 96 mmol/L (ref 96–106)
Creatinine, Ser: 1.17 mg/dL — ABNORMAL HIGH (ref 0.57–1.00)
GFR calc Af Amer: 51 mL/min/{1.73_m2} — ABNORMAL LOW (ref >59)
GFR calc non Af Amer: 44 mL/min/{1.73_m2} — ABNORMAL LOW (ref >59)
Glucose: 327 mg/dL — ABNORMAL HIGH (ref 65–99)
Phosphorus: 3.1 mg/dL (ref 3.0–4.3)
Potassium: 4.2 mmol/L (ref 3.5–5.2)
Sodium: 139 mmol/L (ref 134–144)

## 2020-12-16 LAB — VITAMIN D 25 HYDROXY (VIT D DEFICIENCY, FRACTURES): Vit D, 25-Hydroxy: 63.6 ng/mL (ref 30.0–100.0)

## 2020-12-16 NOTE — Telephone Encounter (Signed)
Unable to leave message; will call back

## 2020-12-17 ENCOUNTER — Telehealth: Payer: Self-pay | Admitting: Licensed Clinical Social Worker

## 2020-12-17 NOTE — Telephone Encounter (Signed)
Left generic message encouraging call back

## 2020-12-19 ENCOUNTER — Encounter: Payer: Self-pay | Admitting: Family Medicine

## 2020-12-19 ENCOUNTER — Telehealth: Payer: Self-pay

## 2020-12-19 ENCOUNTER — Ambulatory Visit (INDEPENDENT_AMBULATORY_CARE_PROVIDER_SITE_OTHER): Payer: Medicare Other | Admitting: Family Medicine

## 2020-12-19 VITALS — BP 132/73 | HR 73 | Temp 97.7°F

## 2020-12-19 DIAGNOSIS — R0981 Nasal congestion: Secondary | ICD-10-CM | POA: Diagnosis not present

## 2020-12-19 DIAGNOSIS — R059 Cough, unspecified: Secondary | ICD-10-CM

## 2020-12-19 NOTE — Telephone Encounter (Signed)
  Incoming Patient Call  12/19/2020  What symptoms do you have? Sinus drainage, sore throat and coughing and Claritin is not helping that Dr.G put her on 2-21 and wants something called in  How long have you been sick? 2 week  Have you been seen for this problem? Yes on 12-15-20  If your provider decides to give you a prescription, which pharmacy would you like for it to be sent to? [facility] in [place]   Patient informed that this information will be sent to the clinical staff for review and that they should receive a follow up call.

## 2020-12-19 NOTE — Telephone Encounter (Signed)
Recommend covid testing

## 2020-12-19 NOTE — Progress Notes (Signed)
Acute Office Visit  Subjective:    Patient ID: [name], female    DOB: 1941-03-25, 80 y.o.   MRN: [id number]  Chief Complaint  Patient presents with  . Cough    Sore throat, congestion,   . Nasal Congestion    HPI Patient is in today for cough and congestion x 6 days. She also reports body aches, chills, tiredness, and sore throat and dry cough. Denies chest pain, shortness of breath, headache, dizziness, abdominal pain or vomiting. She is staying well hydrated. She has been vaccinated against Covid and denies known exposure. She has tried Claritin for her symptoms.   Past Medical History:  Diagnosis Date  . Acute cystitis   . Anxiety   . Depression   . Diabetes mellitus type II   . Diabetic retinopathy   . DM type 2 causing CKD stage 3 ([place])   . Fibromyalgia   . GERD (gastroesophageal reflux disease)   . Hyperlipidemia   . Hypertension   . IBS (irritable bowel syndrome)   . IBS (irritable bowel syndrome)   . Insomnia   . Lichen planus    vulvar  . Pancreatitis, gallstone   . Postmenopausal   . Radial scar of breast 06/04/2016  . Urge incontinence of urine     Past Surgical History:  Procedure Laterality Date  . ANKLE SURGERY  02/2004  . BREAST LUMPECTOMY WITH RADIOACTIVE SEED LOCALIZATION Left 01/22/2016   Procedure: LEFT BREAST LUMPECTOMY WITH RADIOACTIVE SEED LOCALIZATION;  Surgeon: Autumn Messing III, MD;  Location: [facility];  Service: General;  Laterality: Left;  . CATARACT EXTRACTION W/ INTRAOCULAR LENS IMPLANT  09/2003, 12/2003  . CHOLECYSTECTOMY  2004  . EYE SURGERY     laser treatments  . JOINT REPLACEMENT    . TOTAL KNEE ARTHROPLASTY     Left  . TRIGGER FINGER RELEASE Left 01/30/2002  . TUBAL LIGATION  1970    Family History  Problem Relation Age of Onset  . Diabetes Mother   . Hypertension Mother   . Kidney disease Mother   . Heart disease Mother   . Peripheral vascular disease Mother        amputation  . Cancer Father         prostate  . Diabetes Father   . Heart disease Father        CABG  . Hypertension Father   . Heart attack Father   . Depression Sister   . Hypertension Sister   . Neuropathy Sister     Social History   Socioeconomic History  . Marital status: Widowed    Spouse name: Not on file  . Number of children: 3  . Years of education: 33  . Highest education level: Some college, no degree  Occupational History  . Occupation: retired    Fish farm manager: RETIRED    Comment: bookeeping  Tobacco Use  . Smoking status: Never Smoker  . Smokeless tobacco: Never Used  Vaping Use  . Vaping Use: Never used  Substance and Sexual Activity  . Alcohol use: No  . Drug use: No  . Sexual activity: Not Currently  Other Topics Concern  . Not on file  Social History Narrative   Widowed in 2010   Social Determinants of Health   Financial Resource Strain: Not on file  Food Insecurity: No Food Insecurity  . Worried About Charity fundraiser in the Last Year: Never true  . Ran Out of Food in the Last  Year: Never true  Transportation Needs: No Transportation Needs  . Lack of Transportation (Medical): No  . Lack of Transportation (Non-Medical): No  Physical Activity: Inactive  . Days of Exercise per Week: 0 days  . Minutes of Exercise per Session: 0 min  Stress: No Stress Concern Present  . Feeling of Stress : Only a little  Social Connections: Moderately Isolated  . Frequency of Communication with Friends and Family: More than three times a week  . Frequency of Social Gatherings with Friends and Family: Twice a week  . Attends Religious Services: 1 to 4 times per year  . Active Member of Clubs or Organizations: No  . Attends Archivist Meetings: Never  . Marital Status: Widowed  Intimate Partner Violence: Not on file    Outpatient Medications Prior to Visit  Medication Sig Dispense Refill  . acetaminophen (TYLENOL) 325 MG tablet Take 325 mg by mouth as needed.    Marland Kitchen amLODipine  (NORVASC) 5 MG tablet Take 1 tablet (5 mg total) by mouth daily. 90 tablet 3  . aspirin 81 MG tablet Take 81 mg by mouth every other day.    Marland Kitchen atorvastatin (LIPITOR) 10 MG tablet Take 1 tablet (10 mg total) by mouth at bedtime. 90 tablet 1  . Calcium Carb-Cholecalciferol 600-200 MG-UNIT TABS Take 1 tablet by mouth 2 (two) times daily.    . cephALEXin (KEFLEX) 500 MG capsule Take 500 mg by mouth daily. For UTI prevention    . desonide (DESOWEN) 0.05 % cream Apply topically 2 (two) times daily. 30 g 1  . diclofenac sodium (VOLTAREN) 1 % GEL Apply 4 g topically 4 (four) times daily. 400 g 3  . Dulaglutide (TRULICITY) 1.5 QJ/3.3LK SOPN Inject 0.5 mLs (1.5 mg total) into the skin once a week. 12 pen 3  . escitalopram (LEXAPRO) 20 MG tablet Take 1 tablet (20 mg total) by mouth daily. 90 tablet 3  . esomeprazole (NEXIUM) 20 MG capsule Take 1 capsule (20 mg total) by mouth daily at 12 noon. 90 capsule 3  . estradiol (ESTRACE) 0.1 MG/GM vaginal cream Place 2 g vaginally every Monday, Wednesday, and Friday. 42.5 g 1  . fluticasone (FLONASE) 50 MCG/ACT nasal spray Place 2 sprays into both nostrils daily. 16 g 6  . glucose blood (FREESTYLE LITE) test strip Use qid to check blood sugar. Dx E11.9 200 each 11  . insulin aspart (NOVOLOG FLEXPEN) 100 UNIT/ML FlexPen BG less than 80 - no insulin; 80 to 120 - 3 units; 121 to 150 - 4 units; 151 to 200 - 5 units; 201 to 250 - 6 units; 250 or above - 7 units 30 mL 3  . insulin glargine (LANTUS SOLOSTAR) 100 UNIT/ML Solostar Pen Inject 26 Units into the skin every morning. 45 mL 3  . Insulin Pen Needle 31G X 5 MM MISC 1 Device by Does not apply route 4 (four) times daily. 300 each 4  . mirabegron ER (MYRBETRIQ) 50 MG TB24 tablet Take 50 mg by mouth daily.    . Multiple Vitamin (MULTIVITAMIN) tablet Take 1 tablet by mouth daily.    Marland Kitchen nystatin (NYSTATIN) powder APPLY UNDER BILATERAL BREASTS AND GROIN TWICE A DAY (If needed) 60 g 1  . nystatin-triamcinolone ointment  (MYCOLOG) APPLY TOPICALLY 2 (TWO) TIMES DAILY IF NEEDED FOR rash.  NOT A PREVENTATIVE MEDICATION. 60 g 3  . Polyethyl Glycol-Propyl Glycol 0.4-0.3 % SOLN Apply 1 drop to eye daily as needed (for dry eye relief).     Marland Kitchen  polyethylene glycol (MIRALAX / GLYCOLAX) packet Take 17 g by mouth daily as needed.     . Probiotic Product (PROBIOTIC ADVANCED PO) Take 1 capsule by mouth daily.     . traZODone (DESYREL) 100 MG tablet Take 1 tablet (100 mg total) by mouth at bedtime. 90 tablet 0  . meclizine (ANTIVERT) 25 MG tablet Take 1 tablet (25 mg total) by mouth 3 (three) times daily as needed for dizziness. 30 tablet 0   No facility-administered medications prior to visit.    Allergies  Allergen Reactions  . Latex Anaphylaxis and Rash  . Ambien [Zolpidem] Other (See Comments)    Hallucinations  . Augmentin [Amoxicillin-Pot Clavulanate] Nausea And Vomiting  . Belsomra [Suvorexant] Other (See Comments)    Hallucinations  . Hydrocodone-Acetaminophen Other (See Comments)    hallucinations  . Hyoscyamine Sulfate Other (See Comments)  . Melatonin Other (See Comments)    Adverse reaction-"keeps me awake"  . Mobic [Meloxicam]   . Morphine Nausea And Vomiting    REACTION: hallucinations,GI upset  . Oxycodone Other (See Comments)    Hallucinations  . Tramadol     Unknown reaction  . Celebrex [Celecoxib] Itching and Rash  . Sulfa Antibiotics Rash    Review of Systems As per HPI.     Objective:    Physical Exam Vitals and nursing note reviewed.  Constitutional:      General: She is not in acute distress.    Appearance: Normal appearance. She is not ill-appearing, toxic-appearing or diaphoretic.  HENT:     Head: Normocephalic and atraumatic.     Right Ear: Tympanic membrane, ear canal and external ear normal.     Left Ear: Tympanic membrane, ear canal and external ear normal.     Nose: Congestion present.     Mouth/Throat:     Mouth: Mucous membranes are moist.     Pharynx: Oropharynx is  clear. No oropharyngeal exudate or posterior oropharyngeal erythema.  Eyes:     Extraocular Movements: Extraocular movements intact.     Conjunctiva/sclera: Conjunctivae normal.     Pupils: Pupils are equal, round, and reactive to light.  Cardiovascular:     Rate and Rhythm: Normal rate and regular rhythm.     Pulses: Normal pulses.     Heart sounds: Normal heart sounds. No murmur heard.   Pulmonary:     Effort: Pulmonary effort is normal. No respiratory distress.     Breath sounds: Normal breath sounds.  Abdominal:     General: Bowel sounds are normal. There is no distension.     Palpations: Abdomen is soft. There is no mass.     Tenderness: There is no abdominal tenderness. There is no guarding or rebound.  Musculoskeletal:     Cervical back: Neck supple. No tenderness.     Right lower leg: No edema.     Left lower leg: No edema.  Lymphadenopathy:     Cervical: No cervical adenopathy.  Skin:    General: Skin is warm and dry.  Neurological:     General: No focal deficit present.     Mental Status: She is alert and oriented to person, place, and time.  Psychiatric:        Mood and Affect: Mood normal.        Behavior: Behavior normal.     BP 132/73   Pulse 73   Temp 97.7 F (36.5 C) (Temporal)   SpO2 99% Comment: room air Wt Readings from Last 3 Encounters:  12/15/20  151 lb (68.5 kg)  10/14/20 148 lb (67.1 kg)  06/11/20 143 lb (64.9 kg)    Health Maintenance Due  Topic Date Due  . MAMMOGRAM  05/31/2018  . OPHTHALMOLOGY EXAM  10/31/2019  . COVID-19 Vaccine (3 - Booster for Moderna series) 07/02/2020    There are no preventive care reminders to display for this patient.   Lab Results  Component Value Date   TSH 1.790 05/09/2020   Lab Results  Component Value Date   WBC 5.0 06/11/2020   HGB 14.0 06/11/2020   HCT 41.5 06/11/2020   MCV 96 06/11/2020   PLT 159 06/11/2020   Lab Results  Component Value Date   NA 139 12/15/2020   K 4.2 12/15/2020   CO2  27 12/15/2020   GLUCOSE 327 (H) 12/15/2020   BUN 24 12/15/2020   CREATININE 1.17 (H) 12/15/2020   BILITOT 0.3 05/09/2020   ALKPHOS 57 05/09/2020   AST 29 05/09/2020   ALT 21 05/09/2020   PROT 6.3 05/09/2020   ALBUMIN 3.9 12/15/2020   CALCIUM 9.3 12/15/2020   ANIONGAP 10 09/24/2016   Lab Results  Component Value Date   CHOL 133 02/23/2018   Lab Results  Component Value Date   HDL 61 02/23/2018   Lab Results  Component Value Date   LDLCALC 43 02/23/2018   Lab Results  Component Value Date   TRIG 143 02/23/2018   Lab Results  Component Value Date   CHOLHDL 2.2 02/23/2018   Lab Results  Component Value Date   HGBA1C 7.4 (H) 12/15/2020       Assessment & Plan:   Telia was seen today for cough and nasal congestion.  Diagnoses and all orders for this visit:  Cough Covid test pending, quarantine until results. OTC cough medication. -     Novel Coronavirus, NAA ([facility])  Head congestion Mucinex, nasal saline for congestion. Tylenol for pain. Stay well hydrated, rest.  Return to office for new or worsening symptoms, or if symptoms persist.   The patient indicates understanding of these issues and agrees with the plan.   Gwenlyn Perking, FNP

## 2020-12-19 NOTE — Telephone Encounter (Signed)
Spoke with patient, appointment scheduled today in Covid clinic at 4:15 pm

## 2020-12-20 LAB — NOVEL CORONAVIRUS, NAA: SARS-CoV-2, NAA: NOT DETECTED

## 2020-12-20 LAB — SARS-COV-2, NAA 2 DAY TAT

## 2020-12-25 ENCOUNTER — Encounter (INDEPENDENT_AMBULATORY_CARE_PROVIDER_SITE_OTHER): Payer: Medicare Other | Admitting: Ophthalmology

## 2020-12-26 ENCOUNTER — Telehealth: Payer: Self-pay

## 2020-12-26 ENCOUNTER — Other Ambulatory Visit: Payer: Self-pay | Admitting: Family Medicine

## 2020-12-26 ENCOUNTER — Other Ambulatory Visit: Payer: Self-pay

## 2020-12-26 DIAGNOSIS — R059 Cough, unspecified: Secondary | ICD-10-CM

## 2020-12-26 MED ORDER — BENZONATATE 100 MG PO CAPS: 100.0000 mg | ORAL_CAPSULE | Freq: Three times a day (TID) | ORAL | 0 refills | Status: DC | PRN

## 2020-12-26 NOTE — Telephone Encounter (Signed)
Patient notified that rx sent to pharmacy. Verbalized understanding

## 2020-12-26 NOTE — Telephone Encounter (Signed)
Patient seen Tiffany on 11/2020- please review and advise if medication can be sent in.

## 2020-12-26 NOTE — Telephone Encounter (Signed)
Per chart, medication sent to [facility].  Patient aware

## 2020-12-26 NOTE — Telephone Encounter (Signed)
Rx for tessalon perles sent in as requested.

## 2020-12-30 DIAGNOSIS — E1140 Type 2 diabetes mellitus with diabetic neuropathy, unspecified: Secondary | ICD-10-CM | POA: Diagnosis not present

## 2020-12-30 DIAGNOSIS — Z794 Long term (current) use of insulin: Secondary | ICD-10-CM | POA: Diagnosis not present

## 2021-01-08 DIAGNOSIS — L84 Corns and callosities: Secondary | ICD-10-CM | POA: Diagnosis not present

## 2021-01-08 DIAGNOSIS — E1142 Type 2 diabetes mellitus with diabetic polyneuropathy: Secondary | ICD-10-CM | POA: Diagnosis not present

## 2021-01-08 DIAGNOSIS — B351 Tinea unguium: Secondary | ICD-10-CM | POA: Diagnosis not present

## 2021-01-08 DIAGNOSIS — M79676 Pain in unspecified toe(s): Secondary | ICD-10-CM | POA: Diagnosis not present

## 2021-01-12 ENCOUNTER — Encounter (INDEPENDENT_AMBULATORY_CARE_PROVIDER_SITE_OTHER): Payer: Medicare Other | Admitting: Ophthalmology

## 2021-01-12 ENCOUNTER — Other Ambulatory Visit: Payer: Self-pay

## 2021-01-12 DIAGNOSIS — I10 Essential (primary) hypertension: Secondary | ICD-10-CM | POA: Diagnosis not present

## 2021-01-12 DIAGNOSIS — E113391 Type 2 diabetes mellitus with moderate nonproliferative diabetic retinopathy without macular edema, right eye: Secondary | ICD-10-CM | POA: Diagnosis not present

## 2021-01-12 DIAGNOSIS — H43813 Vitreous degeneration, bilateral: Secondary | ICD-10-CM | POA: Diagnosis not present

## 2021-01-12 DIAGNOSIS — H35033 Hypertensive retinopathy, bilateral: Secondary | ICD-10-CM | POA: Diagnosis not present

## 2021-01-12 DIAGNOSIS — E113312 Type 2 diabetes mellitus with moderate nonproliferative diabetic retinopathy with macular edema, left eye: Secondary | ICD-10-CM

## 2021-01-12 LAB — HM DIABETES EYE EXAM

## 2021-01-16 DIAGNOSIS — H2513 Age-related nuclear cataract, bilateral: Secondary | ICD-10-CM | POA: Diagnosis not present

## 2021-01-16 DIAGNOSIS — H40033 Anatomical narrow angle, bilateral: Secondary | ICD-10-CM | POA: Diagnosis not present

## 2021-01-22 DIAGNOSIS — N3281 Overactive bladder: Secondary | ICD-10-CM | POA: Diagnosis not present

## 2021-01-22 DIAGNOSIS — R8271 Bacteriuria: Secondary | ICD-10-CM | POA: Diagnosis not present

## 2021-01-22 DIAGNOSIS — N3020 Other chronic cystitis without hematuria: Secondary | ICD-10-CM | POA: Diagnosis not present

## 2021-01-28 ENCOUNTER — Encounter: Payer: Self-pay | Admitting: Family Medicine

## 2021-01-28 ENCOUNTER — Ambulatory Visit (INDEPENDENT_AMBULATORY_CARE_PROVIDER_SITE_OTHER): Payer: Medicare Other | Admitting: Family Medicine

## 2021-01-28 VITALS — BP 115/71 | Temp 96.0°F

## 2021-01-28 DIAGNOSIS — Z794 Long term (current) use of insulin: Secondary | ICD-10-CM | POA: Diagnosis not present

## 2021-01-28 DIAGNOSIS — A084 Viral intestinal infection, unspecified: Secondary | ICD-10-CM

## 2021-01-28 DIAGNOSIS — N1831 Chronic kidney disease, stage 3a: Secondary | ICD-10-CM

## 2021-01-28 DIAGNOSIS — E1122 Type 2 diabetes mellitus with diabetic chronic kidney disease: Secondary | ICD-10-CM

## 2021-01-28 MED ORDER — ONDANSETRON 4 MG PO TBDP: 4.0000 mg | ORAL_TABLET | Freq: Three times a day (TID) | ORAL | 0 refills | Status: DC | PRN

## 2021-01-28 MED ORDER — NOVOLOG FLEXPEN 100 UNIT/ML ~~LOC~~ SOPN: PEN_INJECTOR | SUBCUTANEOUS | 0 refills | Status: DC

## 2021-01-28 NOTE — Progress Notes (Signed)
Virtual Visit via Telephone Note  I connected with [name] on 01/28/21 at 12:00 PM by telephone and verified that I am speaking with the correct person using two identifiers. [name] is currently located at home and nobody is currently with her during this visit. The provider, Loman Brooklyn, FNP is located in their home at time of visit.  I discussed the limitations, risks, security and privacy concerns of performing an evaluation and management service by telephone and the availability of in person appointments. I also discussed with the patient that there may be a patient responsible charge related to this service. The patient expressed understanding and agreed to proceed.  Subjective: PCP: [name], DO  Chief Complaint  Patient presents with  . Nausea   Patient reports she was vomiting all day on Saturday. She has not vomited since but has been nauseated and feeling weak. Denies diarrhea. Is drinking plenty of fluids and urinating normally.    ROS: Per HPI  Current Outpatient Medications:  .  acetaminophen (TYLENOL) 325 MG tablet, Take 325 mg by mouth as needed., Disp: , Rfl:  .  amLODipine (NORVASC) 5 MG tablet, Take 1 tablet (5 mg total) by mouth daily., Disp: 90 tablet, Rfl: 3 .  aspirin 81 MG tablet, Take 81 mg by mouth every other day., Disp: , Rfl:  .  atorvastatin (LIPITOR) 10 MG tablet, Take 1 tablet (10 mg total) by mouth at bedtime., Disp: 90 tablet, Rfl: 1 .  benzonatate (TESSALON PERLES) 100 MG capsule, Take 1 capsule (100 mg total) by mouth 3 (three) times daily as needed for cough., Disp: 20 capsule, Rfl: 0 .  Calcium Carb-Cholecalciferol 600-200 MG-UNIT TABS, Take 1 tablet by mouth 2 (two) times daily., Disp: , Rfl:  .  cephALEXin (KEFLEX) 500 MG capsule, Take 500 mg by mouth daily. For UTI prevention, Disp: , Rfl:  .  desonide (DESOWEN) 0.05 % cream, Apply topically 2 (two) times daily., Disp: 30 g, Rfl: 1 .  diclofenac sodium (VOLTAREN) 1 %  GEL, Apply 4 g topically 4 (four) times daily., Disp: 400 g, Rfl: 3 .  Dulaglutide (TRULICITY) 1.5 VP/7.1GG SOPN, Inject 0.5 mLs (1.5 mg total) into the skin once a week., Disp: 12 pen, Rfl: 3 .  escitalopram (LEXAPRO) 20 MG tablet, Take 1 tablet (20 mg total) by mouth daily., Disp: 90 tablet, Rfl: 3 .  esomeprazole (NEXIUM) 20 MG capsule, Take 1 capsule (20 mg total) by mouth daily at 12 noon., Disp: 90 capsule, Rfl: 3 .  estradiol (ESTRACE) 0.1 MG/GM vaginal cream, Place 2 g vaginally every Monday, Wednesday, and Friday., Disp: 42.5 g, Rfl: 1 .  fluticasone (FLONASE) 50 MCG/ACT nasal spray, Place 2 sprays into both nostrils daily., Disp: 16 g, Rfl: 6 .  glucose blood (FREESTYLE LITE) test strip, Use qid to check blood sugar. Dx E11.9, Disp: 200 each, Rfl: 11 .  insulin aspart (NOVOLOG FLEXPEN) 100 UNIT/ML FlexPen, BG less than 80 - no insulin; 80 to 120 - 3 units; 121 to 150 - 4 units; 151 to 200 - 5 units; 201 to 250 - 6 units; 250 or above - 7 units, Disp: 30 mL, Rfl: 3 .  insulin glargine (LANTUS SOLOSTAR) 100 UNIT/ML Solostar Pen, Inject 26 Units into the skin every morning., Disp: 45 mL, Rfl: 3 .  Insulin Pen Needle 31G X 5 MM MISC, 1 Device by Does not apply route 4 (four) times daily., Disp: 300 each, Rfl: 4 .  mirabegron ER (MYRBETRIQ) 50 MG TB24 tablet, Take 50 mg by mouth daily., Disp: , Rfl:  .  Multiple Vitamin (MULTIVITAMIN) tablet, Take 1 tablet by mouth daily., Disp: , Rfl:  .  nystatin (NYSTATIN) powder, APPLY UNDER BILATERAL BREASTS AND GROIN TWICE A DAY (If needed), Disp: 60 g, Rfl: 1 .  nystatin-triamcinolone ointment (MYCOLOG), APPLY TOPICALLY 2 (TWO) TIMES DAILY IF NEEDED FOR rash.  NOT A PREVENTATIVE MEDICATION., Disp: 60 g, Rfl: 3 .  Polyethyl Glycol-Propyl Glycol 0.4-0.3 % SOLN, Apply 1 drop to eye daily as needed (for dry eye relief). , Disp: , Rfl:  .  polyethylene glycol (MIRALAX / GLYCOLAX) packet, Take 17 g by mouth daily as needed. , Disp: , Rfl:  .  Probiotic Product  (PROBIOTIC ADVANCED PO), Take 1 capsule by mouth daily. , Disp: , Rfl:  .  traZODone (DESYREL) 100 MG tablet, Take 1 tablet (100 mg total) by mouth at bedtime., Disp: 90 tablet, Rfl: 0  Allergies  Allergen Reactions  . Latex Anaphylaxis and Rash  . Ambien [Zolpidem] Other (See Comments)    Hallucinations  . Augmentin [Amoxicillin-Pot Clavulanate] Nausea And Vomiting  . Belsomra [Suvorexant] Other (See Comments)    Hallucinations  . Hydrocodone-Acetaminophen Other (See Comments)    hallucinations  . Hyoscyamine Sulfate Other (See Comments)  . Melatonin Other (See Comments)    Adverse reaction-"keeps me awake"  . Mobic [Meloxicam]   . Morphine Nausea And Vomiting    REACTION: hallucinations,GI upset  . Oxycodone Other (See Comments)    Hallucinations  . Tramadol     Unknown reaction  . Celebrex [Celecoxib] Itching and Rash  . Sulfa Antibiotics Rash   Past Medical History:  Diagnosis Date  . Acute cystitis   . Anxiety   . Depression   . Diabetes mellitus type II   . Diabetic retinopathy   . DM type 2 causing CKD stage 3 ([place])   . Fibromyalgia   . GERD (gastroesophageal reflux disease)   . Hyperlipidemia   . Hypertension   . IBS (irritable bowel syndrome)   . IBS (irritable bowel syndrome)   . Insomnia   . Lichen planus    vulvar  . Pancreatitis, gallstone   . Postmenopausal   . Radial scar of breast 06/04/2016  . Urge incontinence of urine     Observations/Objective: A&O  No respiratory distress or wheezing audible over the phone Mood, judgement, and thought processes all WNL   Assessment and Plan: 1. Viral gastroenteritis Encouraged adequate hydration. Advised not to stop diarrhea if it starts. Zofran given for nausea.  - ondansetron (ZOFRAN ODT) 4 MG disintegrating tablet; Take 1 tablet (4 mg total) by mouth every 8 (eight) hours as needed for nausea or vomiting.  Dispense: 30 tablet; Refill: 0   Follow Up Instructions:  I discussed the assessment and  treatment plan with the patient. The patient was provided an opportunity to ask questions and all were answered. The patient agreed with the plan and demonstrated an understanding of the instructions.   The patient was advised to call back or seek an in-person evaluation if the symptoms worsen or if the condition fails to improve as anticipated.  The above assessment and management plan was discussed with the patient. The patient verbalized understanding of and has agreed to the management plan. Patient is aware to call the clinic if symptoms persist or worsen. Patient is aware when to return to the clinic for a follow-up visit. Patient educated on when it is  appropriate to go to the emergency department.   Time call ended: 12:11 PM  I provided 11 minutes of non-face-to-face time during this encounter.  Hendricks Limes, MSN, APRN, FNP-[facility] 01/28/21

## 2021-02-12 ENCOUNTER — Telehealth: Payer: Self-pay

## 2021-02-12 DIAGNOSIS — Z794 Long term (current) use of insulin: Secondary | ICD-10-CM

## 2021-02-12 MED ORDER — FREESTYLE LITE TEST VI STRP: ORAL_STRIP | 3 refills | Status: DC

## 2021-02-12 NOTE — Telephone Encounter (Signed)
LMOVM test strips sent to [facility]

## 2021-02-12 NOTE — Telephone Encounter (Signed)
  Prescription Request  02/12/2021  What is the name of the medication or equipment? Pt needs test strips for freestyle libre light  Have you contacted your pharmacy to request a refill? (if applicable) no  Which pharmacy would you like this sent to? [facility]   Patient notified that their request is being sent to the clinical staff for review and that they should receive a response within 2 business days.

## 2021-02-23 DIAGNOSIS — N3020 Other chronic cystitis without hematuria: Secondary | ICD-10-CM | POA: Diagnosis not present

## 2021-02-23 DIAGNOSIS — N3281 Overactive bladder: Secondary | ICD-10-CM | POA: Diagnosis not present

## 2021-02-23 DIAGNOSIS — R350 Frequency of micturition: Secondary | ICD-10-CM | POA: Diagnosis not present

## 2021-03-11 ENCOUNTER — Other Ambulatory Visit: Payer: Self-pay

## 2021-03-11 DIAGNOSIS — F5101 Primary insomnia: Secondary | ICD-10-CM

## 2021-03-11 MED ORDER — TRAZODONE HCL 100 MG PO TABS: 100.0000 mg | ORAL_TABLET | Freq: Every day | ORAL | 1 refills | Status: DC

## 2021-03-11 NOTE — Telephone Encounter (Signed)
Last seen for Insomnia 09/2020  She does have f/u appt scheduled with you for 03/17/21.   Ok to refill

## 2021-03-11 NOTE — Telephone Encounter (Signed)
  Prescription Request  03/11/2021  What is the name of the medication or equipment? Trazodone  Have you contacted your pharmacy to request a refill? (if applicable) Yes  Which pharmacy would you like this sent to? [facility]

## 2021-03-12 NOTE — Telephone Encounter (Signed)
Pt aware refill sent to pharmacy 

## 2021-03-17 ENCOUNTER — Ambulatory Visit (INDEPENDENT_AMBULATORY_CARE_PROVIDER_SITE_OTHER): Payer: Medicare Other | Admitting: Family Medicine

## 2021-03-17 ENCOUNTER — Encounter: Payer: Self-pay | Admitting: Family Medicine

## 2021-03-17 ENCOUNTER — Other Ambulatory Visit: Payer: Self-pay

## 2021-03-17 VITALS — BP 128/74 | HR 74 | Temp 97.4°F | Ht 63.0 in | Wt 149.6 lb

## 2021-03-17 DIAGNOSIS — M1990 Unspecified osteoarthritis, unspecified site: Secondary | ICD-10-CM | POA: Diagnosis not present

## 2021-03-17 DIAGNOSIS — E1122 Type 2 diabetes mellitus with diabetic chronic kidney disease: Secondary | ICD-10-CM | POA: Diagnosis not present

## 2021-03-17 DIAGNOSIS — E1159 Type 2 diabetes mellitus with other circulatory complications: Secondary | ICD-10-CM

## 2021-03-17 DIAGNOSIS — K219 Gastro-esophageal reflux disease without esophagitis: Secondary | ICD-10-CM

## 2021-03-17 DIAGNOSIS — N1831 Chronic kidney disease, stage 3a: Secondary | ICD-10-CM | POA: Diagnosis not present

## 2021-03-17 DIAGNOSIS — E785 Hyperlipidemia, unspecified: Secondary | ICD-10-CM

## 2021-03-17 DIAGNOSIS — I152 Hypertension secondary to endocrine disorders: Secondary | ICD-10-CM | POA: Diagnosis not present

## 2021-03-17 DIAGNOSIS — E11319 Type 2 diabetes mellitus with unspecified diabetic retinopathy without macular edema: Secondary | ICD-10-CM | POA: Diagnosis not present

## 2021-03-17 DIAGNOSIS — E1169 Type 2 diabetes mellitus with other specified complication: Secondary | ICD-10-CM | POA: Diagnosis not present

## 2021-03-17 DIAGNOSIS — E1142 Type 2 diabetes mellitus with diabetic polyneuropathy: Secondary | ICD-10-CM

## 2021-03-17 DIAGNOSIS — Z794 Long term (current) use of insulin: Secondary | ICD-10-CM | POA: Diagnosis not present

## 2021-03-17 DIAGNOSIS — F3289 Other specified depressive episodes: Secondary | ICD-10-CM | POA: Diagnosis not present

## 2021-03-17 MED ORDER — DICLOFENAC SODIUM 1 % EX GEL: 4.0000 g | Freq: Four times a day (QID) | CUTANEOUS | 3 refills | Status: DC | PRN

## 2021-03-17 MED ORDER — ATORVASTATIN CALCIUM 10 MG PO TABS: 10.0000 mg | ORAL_TABLET | Freq: Every day | ORAL | 3 refills | Status: DC

## 2021-03-17 MED ORDER — ESCITALOPRAM OXALATE 20 MG PO TABS: 20.0000 mg | ORAL_TABLET | Freq: Every day | ORAL | 3 refills | Status: DC

## 2021-03-17 MED ORDER — ESOMEPRAZOLE MAGNESIUM 20 MG PO CPDR: 20.0000 mg | DELAYED_RELEASE_CAPSULE | Freq: Every day | ORAL | 3 refills | Status: DC

## 2021-03-17 MED ORDER — AMLODIPINE BESYLATE 5 MG PO TABS: 5.0000 mg | ORAL_TABLET | Freq: Every day | ORAL | 3 refills | Status: DC

## 2021-03-17 NOTE — Patient Instructions (Signed)

## 2021-03-17 NOTE — Progress Notes (Signed)
Subjective: CC: DM PCP: [name], DO YKZ:LDJTT C Strege is a 80 y.o. female presenting to clinic today for:  1. Type 2 Diabetes with hypertension, hyperlipidemia w/ CKD3a, retinopathy and neuropathy:  She is followed very closely by endocrinology.  Has appointment coming up soon.  Last A1c was actually slightly lower than our check at 7.2.  Because she was having some hypoglycemic episode she reports her Lantus was reduced to 20 units daily and her sliding scale was also reduced some.  She continues to have good urine output and takes Keflex daily for bladder maintenance.  She recently was switched from Myrbetriq to [place] for her bladder and she does feel that this is working better.  She is compliant with her Norvasc for blood pressure.  No chest pains, shortness of breath, dizziness reported.  She is currently using the freestyle libre 2 and finds that this is pretty helpful in maintaining a good check on her blood sugar.  Continues to avoid NSAIDs.  She continues to have arthritis and uses Voltaren gel for this.  She needs a refill on this.  Last eye exam: Up-to-date Last foot exam: Up-to-date Last A1c:  Lab Results  Component Value Date   HGBA1C 7.4 (H) 12/15/2020   Nephropathy screen indicated?:  Up-to-date Last flu, zoster and/or pneumovax:  Immunization History  Administered Date(s) Administered  . Fluad Quad(high Dose 65+) 07/09/2019, 09/08/2020  . Influenza, High Dose Seasonal PF 07/19/2016, 10/13/2017, 08/28/2018  . Influenza,inj,Quad PF,6+ Mos 07/30/2013, 07/24/2014, 08/04/2015  . Moderna Sars-Covid-2 Vaccination 11/29/2019, 12/31/2019  . Pneumococcal Conjugate-13 11/27/2014  . Pneumococcal Polysaccharide-23 10/22/2009  . Td 10/25/2004  . Tdap 11/27/2014  . Zoster 04/06/2012     ROS: Per HPI  Allergies  Allergen Reactions  . Latex Anaphylaxis and Rash  . Ambien [Zolpidem] Other (See Comments)    Hallucinations  . Augmentin [Amoxicillin-Pot Clavulanate]  Nausea And Vomiting  . Belsomra [Suvorexant] Other (See Comments)    Hallucinations  . Hydrocodone-Acetaminophen Other (See Comments)    hallucinations  . Hyoscyamine Sulfate Other (See Comments)  . Melatonin Other (See Comments)    Adverse reaction-"keeps me awake"  . Mobic [Meloxicam]   . Morphine Nausea And Vomiting    REACTION: hallucinations,GI upset  . Oxycodone Other (See Comments)    Hallucinations  . Tramadol     Unknown reaction  . Celebrex [Celecoxib] Itching and Rash  . Sulfa Antibiotics Rash   Past Medical History:  Diagnosis Date  . Acute cystitis   . Anxiety   . Depression   . Diabetes mellitus type II   . Diabetic retinopathy   . DM type 2 causing CKD stage 3 ([place])   . Fibromyalgia   . GERD (gastroesophageal reflux disease)   . Hyperlipidemia   . Hypertension   . IBS (irritable bowel syndrome)   . IBS (irritable bowel syndrome)   . Insomnia   . Lichen planus    vulvar  . Pancreatitis, gallstone   . Postmenopausal   . Radial scar of breast 06/04/2016  . Urge incontinence of urine     Current Outpatient Medications:  .  acetaminophen (TYLENOL) 325 MG tablet, Take 325 mg by mouth as needed., Disp: , Rfl:  .  amLODipine (NORVASC) 5 MG tablet, Take 1 tablet (5 mg total) by mouth daily., Disp: 90 tablet, Rfl: 3 .  aspirin 81 MG tablet, Take 81 mg by mouth every other day., Disp: , Rfl:  .  atorvastatin (LIPITOR) 10 MG tablet, Take  1 tablet (10 mg total) by mouth at bedtime., Disp: 90 tablet, Rfl: 1 .  Calcium Carb-Cholecalciferol 600-200 MG-UNIT TABS, Take 1 tablet by mouth 2 (two) times daily., Disp: , Rfl:  .  cephALEXin (KEFLEX) 500 MG capsule, Take 500 mg by mouth daily. For UTI prevention, Disp: , Rfl:  .  desonide (DESOWEN) 0.05 % cream, Apply topically 2 (two) times daily., Disp: 30 g, Rfl: 1 .  diclofenac sodium (VOLTAREN) 1 % GEL, Apply 4 g topically 4 (four) times daily., Disp: 400 g, Rfl: 3 .  Dulaglutide (TRULICITY) 1.5 UV/2.5DG SOPN, Inject 0.5  mLs (1.5 mg total) into the skin once a week., Disp: 12 pen, Rfl: 3 .  escitalopram (LEXAPRO) 20 MG tablet, Take 1 tablet (20 mg total) by mouth daily., Disp: 90 tablet, Rfl: 3 .  esomeprazole (NEXIUM) 20 MG capsule, Take 1 capsule (20 mg total) by mouth daily at 12 noon., Disp: 90 capsule, Rfl: 3 .  estradiol (ESTRACE) 0.1 MG/GM vaginal cream, Place 2 g vaginally every Monday, Wednesday, and Friday., Disp: 42.5 g, Rfl: 1 .  fluticasone (FLONASE) 50 MCG/ACT nasal spray, Place 2 sprays into both nostrils daily., Disp: 16 g, Rfl: 6 .  glucose blood (FREESTYLE LITE) test strip, Use qid to check blood sugar. Dx E11.9, Disp: 400 each, Rfl: 3 .  insulin aspart (NOVOLOG FLEXPEN) 100 UNIT/ML FlexPen, BG less than 80 - no insulin; 80 to 120 - 3 units; 121 to 150 - 4 units; 151 to 200 - 5 units; 201 to 250 - 6 units; 250 or above - 7 units, Disp: 30 mL, Rfl: 0 .  insulin glargine (LANTUS SOLOSTAR) 100 UNIT/ML Solostar Pen, Inject 26 Units into the skin every morning., Disp: 45 mL, Rfl: 3 .  Insulin Pen Needle 31G X 5 MM MISC, 1 Device by Does not apply route 4 (four) times daily., Disp: 300 each, Rfl: 4 .  mirabegron ER (MYRBETRIQ) 50 MG TB24 tablet, Take 50 mg by mouth daily., Disp: , Rfl:  .  Multiple Vitamin (MULTIVITAMIN) tablet, Take 1 tablet by mouth daily., Disp: , Rfl:  .  nystatin (NYSTATIN) powder, APPLY UNDER BILATERAL BREASTS AND GROIN TWICE A DAY (If needed), Disp: 60 g, Rfl: 1 .  nystatin-triamcinolone ointment (MYCOLOG), APPLY TOPICALLY 2 (TWO) TIMES DAILY IF NEEDED FOR rash.  NOT A PREVENTATIVE MEDICATION., Disp: 60 g, Rfl: 3 .  ondansetron (ZOFRAN ODT) 4 MG disintegrating tablet, Take 1 tablet (4 mg total) by mouth every 8 (eight) hours as needed for nausea or vomiting., Disp: 30 tablet, Rfl: 0 .  Polyethyl Glycol-Propyl Glycol 0.4-0.3 % SOLN, Apply 1 drop to eye daily as needed (for dry eye relief). , Disp: , Rfl:  .  polyethylene glycol (MIRALAX / GLYCOLAX) packet, Take 17 g by mouth daily as  needed. , Disp: , Rfl:  .  Probiotic Product (PROBIOTIC ADVANCED PO), Take 1 capsule by mouth daily. , Disp: , Rfl:  .  traZODone (DESYREL) 100 MG tablet, Take 1 tablet (100 mg total) by mouth at bedtime., Disp: 90 tablet, Rfl: 1 Social History   Socioeconomic History  . Marital status: Widowed    Spouse name: Not on file  . Number of children: 3  . Years of education: 21  . Highest education level: Some college, no degree  Occupational History  . Occupation: retired    Fish farm manager: RETIRED    Comment: bookeeping  Tobacco Use  . Smoking status: Never Smoker  . Smokeless tobacco: Never Used  Vaping  Use  . Vaping Use: Never used  Substance and Sexual Activity  . Alcohol use: No  . Drug use: No  . Sexual activity: Not Currently  Other Topics Concern  . Not on file  Social History Narrative   Widowed in 2010   Social Determinants of Health   Financial Resource Strain: Not on file  Food Insecurity: No Food Insecurity  . Worried About Charity fundraiser in the Last Year: Never true  . Ran Out of Food in the Last Year: Never true  Transportation Needs: No Transportation Needs  . Lack of Transportation (Medical): No  . Lack of Transportation (Non-Medical): No  Physical Activity: Inactive  . Days of Exercise per Week: 0 days  . Minutes of Exercise per Session: 0 min  Stress: No Stress Concern Present  . Feeling of Stress : Only a little  Social Connections: Moderately Isolated  . Frequency of Communication with Friends and Family: More than three times a week  . Frequency of Social Gatherings with Friends and Family: Twice a week  . Attends Religious Services: 1 to 4 times per year  . Active Member of Clubs or Organizations: No  . Attends Archivist Meetings: Never  . Marital Status: Widowed  Intimate Partner Violence: Not on file   Family History  Problem Relation Age of Onset  . Diabetes Mother   . Hypertension Mother   . Kidney disease Mother   . Heart  disease Mother   . Peripheral vascular disease Mother        amputation  . Cancer Father        prostate  . Diabetes Father   . Heart disease Father        CABG  . Hypertension Father   . Heart attack Father   . Depression Sister   . Hypertension Sister   . Neuropathy Sister     Objective: Office vital signs reviewed. BP 128/74   Pulse 74   Temp (!) 97.4 F (36.3 C)   Ht 5\' 3"  (1.6 m)   Wt 149 lb 9.6 oz (67.9 kg)   SpO2 98%   BMI 26.50 kg/m   Physical Examination:  General: Awake, alert, well nourished, No acute distress HEENT: Normal; sclera white.  Moist mucous membranes Cardio: regular rate and rhythm, S1S2 heard, no murmurs appreciated Pulm: clear to auscultation bilaterally, no wheezes, rhonchi or rales; normal work of breathing on room air Extremities: warm, well perfused, No edema, cyanosis or clubbing; +2 pulses bilaterally  Assessment/ Plan: 80 y.o. female   Type 2 diabetes mellitus with stage 3a chronic kidney disease, with long-term current use of insulin ([place]) - Plan: Renal Function Panel, CBC  Hyperlipidemia associated with type 2 diabetes mellitus ([place]) - Plan: TSH  Hypertension associated with diabetes ([place])  Diabetic polyneuropathy associated with type 2 diabetes mellitus ([place])  Diabetic retinopathy without macular edema associated with type 2 diabetes mellitus, unspecified laterality, unspecified retinopathy severity ([place])  Arthritis - Plan: diclofenac Sodium (VOLTAREN) 1 % GEL  Sugar is at goal for age.  Continue to follow-up with endocrinology as scheduled.  Ongoing to check a renal function panel, CBC and TSH today.  Blood pressure under good control.  No changes needed  Voltaren gel renewed for arthritis.  She may follow-up in 4 to 6 months, sooner if needed  Orders Placed This Encounter  Procedures  . Renal Function Panel  . CBC  . TSH   Meds ordered this encounter  Medications  .  diclofenac Sodium (VOLTAREN) 1 % GEL    Sig: Apply  4 g topically 4 (four) times daily as needed (arthritis).    Dispense:  400 g    Refill:  3  . amLODipine (NORVASC) 5 MG tablet    Sig: Take 1 tablet (5 mg total) by mouth daily.    Dispense:  90 tablet    Refill:  3  . atorvastatin (LIPITOR) 10 MG tablet    Sig: Take 1 tablet (10 mg total) by mouth at bedtime.    Dispense:  90 tablet    Refill:  3  . escitalopram (LEXAPRO) 20 MG tablet    Sig: Take 1 tablet (20 mg total) by mouth daily.    Dispense:  90 tablet    Refill:  3  . esomeprazole (NEXIUM) 20 MG capsule    Sig: Take 1 capsule (20 mg total) by mouth daily at 12 noon.    Dispense:  90 capsule    Refill:  Rosalia, [name] [phone]

## 2021-03-18 LAB — RENAL FUNCTION PANEL
Albumin: 4.1 g/dL (ref 3.7–4.7)
BUN/Creatinine Ratio: 24 (ref 12–28)
BUN: 31 mg/dL — ABNORMAL HIGH (ref 8–27)
CO2: 26 mmol/L (ref 20–29)
Calcium: 9.5 mg/dL (ref 8.7–10.3)
Chloride: 97 mmol/L (ref 96–106)
Creatinine, Ser: 1.28 mg/dL — ABNORMAL HIGH (ref 0.57–1.00)
Glucose: 151 mg/dL — ABNORMAL HIGH (ref 65–99)
Phosphorus: 4.4 mg/dL — ABNORMAL HIGH (ref 3.0–4.3)
Potassium: 4.9 mmol/L (ref 3.5–5.2)
Sodium: 139 mmol/L (ref 134–144)
eGFR: 42 mL/min/{1.73_m2} — ABNORMAL LOW (ref >59)

## 2021-03-18 LAB — CBC
Hematocrit: 41.8 % (ref 34.0–46.6)
Hemoglobin: 14.1 g/dL (ref 11.1–15.9)
MCH: 31.1 pg (ref 26.6–33.0)
MCHC: 33.7 g/dL (ref 31.5–35.7)
MCV: 92 fL (ref 79–97)
Platelets: 184 10*3/uL (ref 150–450)
RBC: 4.53 x10E6/uL (ref 3.77–5.28)
RDW: 11.8 % (ref 11.7–15.4)
WBC: 5 10*3/uL (ref 3.4–10.8)

## 2021-03-18 LAB — TSH: TSH: 1.73 u[IU]/mL (ref 0.450–4.500)

## 2021-03-27 DIAGNOSIS — H04123 Dry eye syndrome of bilateral lacrimal glands: Secondary | ICD-10-CM | POA: Diagnosis not present

## 2021-04-02 ENCOUNTER — Other Ambulatory Visit: Payer: Self-pay

## 2021-04-02 ENCOUNTER — Encounter (INDEPENDENT_AMBULATORY_CARE_PROVIDER_SITE_OTHER): Payer: Medicare Other | Admitting: Ophthalmology

## 2021-04-02 DIAGNOSIS — I10 Essential (primary) hypertension: Secondary | ICD-10-CM | POA: Diagnosis not present

## 2021-04-02 DIAGNOSIS — H35033 Hypertensive retinopathy, bilateral: Secondary | ICD-10-CM

## 2021-04-02 DIAGNOSIS — E113311 Type 2 diabetes mellitus with moderate nonproliferative diabetic retinopathy with macular edema, right eye: Secondary | ICD-10-CM | POA: Diagnosis not present

## 2021-04-02 DIAGNOSIS — E113392 Type 2 diabetes mellitus with moderate nonproliferative diabetic retinopathy without macular edema, left eye: Secondary | ICD-10-CM | POA: Diagnosis not present

## 2021-04-02 DIAGNOSIS — H43813 Vitreous degeneration, bilateral: Secondary | ICD-10-CM | POA: Diagnosis not present

## 2021-04-08 DIAGNOSIS — G44219 Episodic tension-type headache, not intractable: Secondary | ICD-10-CM | POA: Diagnosis not present

## 2021-04-13 ENCOUNTER — Ambulatory Visit (INDEPENDENT_AMBULATORY_CARE_PROVIDER_SITE_OTHER): Payer: Medicare Other

## 2021-04-13 VITALS — Ht 63.0 in | Wt 150.0 lb

## 2021-04-13 DIAGNOSIS — Z0000 Encounter for general adult medical examination without abnormal findings: Secondary | ICD-10-CM

## 2021-04-13 NOTE — Progress Notes (Signed)
Subjective:   [name] is a 80 y.o. female who presents for Medicare Annual (Subsequent) preventive examination.  Virtual Visit via Telephone Note  I connected with  [name] on 04/13/21 at  2:00 PM EDT by telephone and verified that I am speaking with the correct person using two identifiers.  Location: Patient: Home Provider: [facility] Persons participating in the virtual visit: patient/Nurse Health Advisor   I discussed the limitations, risks, security and privacy concerns of performing an evaluation and management service by telephone and the availability of in person appointments. The patient expressed understanding and agreed to proceed.  Interactive audio and video telecommunications were attempted between this nurse and patient, however failed, due to patient having technical difficulties OR patient did not have access to video capability.  We continued and completed visit with audio only.  Some vital signs may be absent or patient reported.   Jahseh Lucchese E Freda Jaquith, LPN   Review of Systems     Cardiac Risk Factors include: advanced age (>30men, >3 women);diabetes mellitus;dyslipidemia;hypertension;sedentary lifestyle     Objective:    Today's Vitals   04/13/21 1421 04/13/21 1422  Weight: 150 lb (68 kg)   Height: 5\' 3"  (1.6 m)   PainSc:  3    Body mass index is 26.57 kg/m.  Advanced Directives 04/13/2021 04/10/2020 08/03/2019 03/28/2019 08/02/2018 03/02/2018 07/12/2017  Does Patient Have a Medical Advance Directive? Yes Yes Yes Yes Yes Yes Yes  Type of Paramedic of [place];Living will Living will;Out of facility DNR (pink MOST or yellow form);Healthcare Power of [place];Living will [place];Living will [place];Living will Living will;Healthcare Power of [place];Living will  Does patient want to make changes to medical advance directive? - No - Patient  declined No - Patient declined Yes (MAU/Ambulatory/Procedural Areas - Information given) No - Patient declined No - Patient declined No - Patient declined  Copy of [place] in Chart? No - copy requested No - copy requested No - copy requested No - copy requested No - copy requested No - copy requested No - copy requested  Would patient like information on creating a medical advance directive? - - - - - No - Patient declined -    Current Medications (verified) Outpatient Encounter Medications as of 04/13/2021  Medication Sig   acetaminophen (TYLENOL) 325 MG tablet Take 325 mg by mouth as needed.   amLODipine (NORVASC) 5 MG tablet Take 1 tablet (5 mg total) by mouth daily.   aspirin 81 MG tablet Take 81 mg by mouth every other day.   atorvastatin (LIPITOR) 10 MG tablet Take 1 tablet (10 mg total) by mouth at bedtime.   BESIVANCE 0.6 % SUSP Apply to eye.   Calcium Carb-Cholecalciferol 600-200 MG-UNIT TABS Take 1 tablet by mouth 2 (two) times daily.   cephALEXin (KEFLEX) 500 MG capsule Take 500 mg by mouth daily. For UTI prevention   desonide (DESOWEN) 0.05 % cream Apply topically 2 (two) times daily.   diclofenac Sodium (VOLTAREN) 1 % GEL Apply 4 g topically 4 (four) times daily as needed (arthritis).   Dulaglutide (TRULICITY) 1.5 ZD/6.3OV SOPN Inject 0.5 mLs (1.5 mg total) into the skin once a week.   escitalopram (LEXAPRO) 20 MG tablet Take 1 tablet (20 mg total) by mouth daily.   esomeprazole (NEXIUM) 20 MG capsule Take 1 capsule (20 mg total) by mouth daily at 12 noon.   estradiol (  ESTRACE) 0.1 MG/GM vaginal cream Place 2 g vaginally every Monday, Wednesday, and Friday.   fluticasone (FLONASE) 50 MCG/ACT nasal spray Place 2 sprays into both nostrils daily.   glucose blood (FREESTYLE LITE) test strip Use qid to check blood sugar. Dx E11.9   insulin aspart (NOVOLOG FLEXPEN) 100 UNIT/ML FlexPen BG less than 80 - no insulin; 80 to 120 - 3 units; 121 to 150 - 4 units; 151 to 200  - 5 units; 201 to 250 - 6 units; 250 or above - 7 units   insulin glargine (LANTUS SOLOSTAR) 100 UNIT/ML Solostar Pen Inject 26 Units into the skin every morning.   Insulin Pen Needle 31G X 5 MM MISC 1 Device by Does not apply route 4 (four) times daily.   Multiple Vitamin (MULTIVITAMIN) tablet Take 1 tablet by mouth daily.   nystatin (NYSTATIN) powder APPLY UNDER BILATERAL BREASTS AND GROIN TWICE A DAY (If needed)   nystatin-triamcinolone ointment (MYCOLOG) APPLY TOPICALLY 2 (TWO) TIMES DAILY IF NEEDED FOR rash.  NOT A PREVENTATIVE MEDICATION.   ondansetron (ZOFRAN ODT) 4 MG disintegrating tablet Take 1 tablet (4 mg total) by mouth every 8 (eight) hours as needed for nausea or vomiting.   Polyethyl Glycol-Propyl Glycol 0.4-0.3 % SOLN Apply 1 drop to eye daily as needed (for dry eye relief).    polyethylene glycol (MIRALAX / GLYCOLAX) packet Take 17 g by mouth daily as needed.    Probiotic Product (PROBIOTIC ADVANCED PO) Take 1 capsule by mouth daily.    traZODone (DESYREL) 100 MG tablet Take 1 tablet (100 mg total) by mouth at bedtime.   Vibegron 75 MG TABS Take by mouth.   No facility-administered encounter medications on file as of 04/13/2021.    Allergies (verified) Latex, Ambien [zolpidem], Augmentin [amoxicillin-pot clavulanate], Belsomra [suvorexant], Hydrocodone-acetaminophen, Hyoscyamine sulfate, Melatonin, Mobic [meloxicam], Morphine, Oxycodone, Tramadol, Celebrex [celecoxib], and Sulfa antibiotics   History: Past Medical History:  Diagnosis Date   Acute cystitis    Anxiety    Depression    Diabetes mellitus type II    Diabetic retinopathy    DM type 2 causing CKD stage 3 (HCC)    Fibromyalgia    GERD (gastroesophageal reflux disease)    Hyperlipidemia    Hypertension    IBS (irritable bowel syndrome)    IBS (irritable bowel syndrome)    Insomnia    Lichen planus    vulvar   Pancreatitis, gallstone    Postmenopausal    Radial scar of breast 06/04/2016   Urge  incontinence of urine    Past Surgical History:  Procedure Laterality Date   ANKLE SURGERY  02/2004   BREAST LUMPECTOMY WITH RADIOACTIVE SEED LOCALIZATION Left 01/22/2016   Procedure: LEFT BREAST LUMPECTOMY WITH RADIOACTIVE SEED LOCALIZATION;  Surgeon: Autumn Messing III, MD;  Location: [facility];  Service: General;  Laterality: Left;   CATARACT EXTRACTION W/ INTRAOCULAR LENS IMPLANT  09/2003, 12/2003   CHOLECYSTECTOMY  2004   EYE SURGERY     laser treatments   JOINT REPLACEMENT     TOTAL KNEE ARTHROPLASTY     Left   TRIGGER FINGER RELEASE Left 01/30/2002   TUBAL LIGATION  1970   Family History  Problem Relation Age of Onset   Diabetes Mother    Hypertension Mother    Kidney disease Mother    Heart disease Mother    Peripheral vascular disease Mother        amputation   Cancer Father  prostate   Diabetes Father    Heart disease Father        CABG   Hypertension Father    Heart attack Father    Depression Sister    Hypertension Sister    Neuropathy Sister    Social History   Socioeconomic History   Marital status: Widowed    Spouse name: Not on file   Number of children: 3   Years of education: 71   Highest education level: Some college, no degree  Occupational History   Occupation: retired    Fish farm manager: RETIRED    Comment: bookeeping  Tobacco Use   Smoking status: Never   Smokeless tobacco: Never  Vaping Use   Vaping Use: Never used  Substance and Sexual Activity   Alcohol use: No   Drug use: No   Sexual activity: Not Currently  Other Topics Concern   Not on file  Social History Narrative   Widowed in 2010; lives alone. Children live out of town   Social Determinants of Radio broadcast assistant Strain: Not on file  Food Insecurity: Not on file  Transportation Needs: Not on file  Physical Activity: Insufficiently Active   Days of Exercise per Week: 7 days   Minutes of Exercise per Session: 20 min  Stress: Stress Concern Present    Feeling of Stress : To some extent  Social Connections: Socially Isolated   Frequency of Communication with Friends and Family: More than three times a week   Frequency of Social Gatherings with Friends and Family: Once a week   Attends Religious Services: Never   Marine scientist or Organizations: No   Attends Archivist Meetings: Never   Marital Status: Widowed    Tobacco Counseling Counseling given: Not Answered   Clinical Intake:  Pre-visit preparation completed: Yes  Pain : 0-10 Pain Score: 3  Pain Type: Chronic pain Pain Location: Generalized Pain Descriptors / Indicators: Aching, Sore, Discomfort, Dull Pain Onset: More than a month ago Pain Frequency: Intermittent     BMI - recorded: 26.57 Nutritional Status: BMI 25 -29 Overweight Nutritional Risks: None Diabetes: Yes CBG done?: No Did pt. bring in CBG monitor from home?: No  How often do you need to have someone help you when you read instructions, pamphlets, or other written materials from your doctor or pharmacy?: 1 - Never  Nutrition Risk Assessment:  Has the patient had any N/V/D within the last 2 months?  No  Does the patient have any non-healing wounds?  No  Has the patient had any unintentional weight loss or weight gain?  No   Diabetes:  Is the patient diabetic?  Yes  If diabetic, was a CBG obtained today?  No  Did the patient bring in their glucometer from home?  No  How often do you monitor your CBG's? CGM - Freestyle Libre 2.   Financial Strains and Diabetes Management:  Are you having any financial strains with the device, your supplies or your medication? No .  Does the patient want to be seen by Chronic Care Management for management of their diabetes?  No  Would the patient like to be referred to a Nutritionist or for Diabetic Management?  No   Diabetic Exams:  Diabetic Eye Exam: Completed 01/12/2021.   Diabetic Foot Exam: Completed 6/8/221. Pt has been advised about  the importance in completing this exam. Pt is scheduled for diabetic foot exam on 07/21/2021.    Interpreter Needed?: No  Information  entered by :: Ramatoulaye Pack, LPN   Activities of Daily Living In your present state of health, do you have any difficulty performing the following activities: 04/13/2021  Hearing? N  Vision? N  Difficulty concentrating or making decisions? N  Walking or climbing stairs? Y  Comment can do this carefully and one at a time (knee pain)  Dressing or bathing? N  Doing errands, shopping? N  Preparing Food and eating ? N  Using the Toilet? N  In the past six months, have you accidently leaked urine? Y  Comment currently being treated by Urology - wears pads prn for protection  Do you have problems with loss of bowel control? N  Managing your Medications? N  Managing your Finances? N  Housekeeping or managing your Housekeeping? N  Some recent data might be hidden    Patient Care Team: [name], DO as PCP - General (Family Medicine) Paula Compton, MD as Consulting Physician (Obstetrics and Gynecology) Bjorn Loser, MD as Consulting Physician (Urology) Minus Breeding, MD as Consulting Physician (Cardiology) Steffanie Rainwater, DPM as Consulting Physician (Podiatry)  Indicate any recent Medical Services you may have received from other than [facility] providers in the past year (date may be approximate).     Assessment:   This is a routine wellness examination for [place].  Hearing/Vision screen Hearing Screening - Comments:: Denies hearing difficulties  Vision Screening - Comments:: Wears glasses - up to date with routine eye exams with Dr Zigmund Daniel in [place]   Dietary issues and exercise activities discussed: Current Exercise Habits: Home exercise routine, Type of exercise: stretching;walking (mostly yard work, but strenuous at times), Time (Minutes): 30, Frequency (Times/Week): 4, Weekly Exercise (Minutes/Week): 120, Intensity: Mild, Exercise  limited by: orthopedic condition(s)   Goals Addressed             This Visit's Progress    Exercise 3x per week (30 min per time)   On track    Prevent falls   On track      Depression Screen [facility] 2/9 Scores 04/13/2021 03/17/2021 12/15/2020 06/11/2020 05/09/2020 04/10/2020 07/09/2019  PHQ - 2 Score 2 2 4 1 2 2 1   PHQ- 9 Score 4 3 6 3 3 5  -    Fall Risk Fall Risk  04/13/2021 03/17/2021 06/11/2020 05/09/2020 04/10/2020  Falls in the past year? 0 0 0 0 0  Number falls in past yr: 0 - - - -  Injury with Fall? 0 - - - -  Risk for fall due to : Orthopedic patient;Impaired vision - - - -  Risk for fall due to: Comment - - - - -  Follow up Falls prevention discussed - - - -    FALL RISK PREVENTION PERTAINING TO THE HOME:  Any stairs in or around the home? Yes  If so, are there any without handrails? No  Home free of loose throw rugs in walkways, pet beds, electrical cords, etc? Yes  Adequate lighting in your home to reduce risk of falls? Yes   ASSISTIVE DEVICES UTILIZED TO PREVENT FALLS:  Life alert? No  Use of a cane, walker or w/c? No  Grab bars in the bathroom? Yes  Shower chair or bench in shower? Yes  Elevated toilet seat or a handicapped toilet? Yes   TIMED UP AND GO:  Was the test performed? No . Telephonic visit  Cognitive Function: Normal cognitive status assessed by direct observation by this Nurse Health Advisor. No abnormalities found.   MMSE - Mini  Mental State Exam 03/02/2018 01/19/2017 01/16/2016 12/30/2014  Orientation to time 5 5 5 5   Orientation to Place 5 5 5 5   Registration 3 3 3 3   Attention/ Calculation 5 5 5 5   Recall 1 2 3 3   Language- name 2 objects 2 2 2 2   Language- repeat 1 1 1 1   Language- follow 3 step command 3 3 3 3   Language- read & follow direction 1 1 1 1   Write a sentence 1 1 1 1   Copy design 1 1 1 1   Total score 28 29 30 30      6CIT Screen 04/10/2020 03/28/2019  What Year? 0 points 0 points  What month? 0 points 0 points  What time? 0 points  0 points  Count back from 20 0 points 0 points  Months in reverse 0 points 0 points  Repeat phrase 0 points 0 points  Total Score 0 0    Immunizations Immunization History  Administered Date(s) Administered   Fluad Quad(high Dose 65+) 07/09/2019, 09/08/2020   Influenza, High Dose Seasonal PF 07/19/2016, 10/13/2017, 08/28/2018   Influenza,inj,Quad PF,6+ Mos 07/30/2013, 07/24/2014, 08/04/2015   Moderna SARS-COV2 Booster Vaccination 09/13/2020   Moderna Sars-Covid-2 Vaccination 11/29/2019, 12/31/2019   Pneumococcal Conjugate-13 11/27/2014   Pneumococcal Polysaccharide-23 10/22/2009   Td 10/25/2004   Tdap 11/27/2014   Zoster, Live 04/06/2012    TDAP status: Completed at today's visit  Flu Vaccine status: Up to date  Pneumococcal vaccine status: Up to date  Covid-19 vaccine status: Completed vaccines  Qualifies for Shingles Vaccine? Yes   Zostavax completed Yes   Shingrix Completed?: No.    Education has been provided regarding the importance of this vaccine. Patient has been advised to call insurance company to determine out of pocket expense if they have not yet received this vaccine. Advised may also receive vaccine at local pharmacy or Health Dept. Verbalized acceptance and understanding.  Screening Tests Health Maintenance  Topic Date Due   Zoster Vaccines- Shingrix (1 of 2) Never done   COVID-19 Vaccine (4 - Booster for Moderna series) 01/11/2021   FOOT EXAM  04/01/2021   MAMMOGRAM  03/17/2022 (Originally 05/31/2018)   INFLUENZA VACCINE  05/25/2021   DEXA SCAN  05/29/2021   HEMOGLOBIN A1C  06/14/2021   URINE MICROALBUMIN  07/02/2021   OPHTHALMOLOGY EXAM  01/12/2022   TETANUS/TDAP  11/27/2024   PNA vac Low Risk Adult  Completed   HPV VACCINES  Aged Out    Health Maintenance  Health Maintenance Due  Topic Date Due   Zoster Vaccines- Shingrix (1 of 2) Never done   COVID-19 Vaccine (4 - Booster for Moderna series) 01/11/2021   FOOT EXAM  04/01/2021    Colorectal  cancer screening: No longer required.   Mammogram status: Completed 05/2020. Repeat every year Gets these done at [facility] status: Completed 05/29/2018. Results reflect: Bone density results: NORMAL. Repeat every 3 years. Gets these done at [facility] Screening: (Low Dose CT Chest recommended if Age 17-80 years, 30 pack-year currently smoking OR have quit w/in 15years.) does not qualify.   Additional Screening:  Hepatitis C Screening: does not qualify  Vision Screening: Recommended annual ophthalmology exams for early detection of glaucoma and other disorders of the eye. Is the patient up to date with their annual eye exam?  Yes  Who is the provider or what is the name of the office in which the patient attends annual eye exams? [facility] If pt is  not established with a provider, would they like to be referred to a provider to establish care? No .   Dental Screening: Recommended annual dental exams for proper oral hygiene  Community Resource Referral / Chronic Care Management: CRR required this visit?  No   CCM required this visit?  No      Plan:     I have personally reviewed and noted the following in the patient's chart:   Medical and social history Use of alcohol, tobacco or illicit drugs  Current medications and supplements including opioid prescriptions.  Functional ability and status Nutritional status Physical activity Advanced directives List of other physicians Hospitalizations, surgeries, and ER visits in previous 12 months Vitals Screenings to include cognitive, depression, and falls Referrals and appointments  In addition, I have reviewed and discussed with patient certain preventive protocols, quality metrics, and best practice recommendations. A written personalized care plan for preventive services as well as general preventive health recommendations were provided to patient.     Sandrea Hammond, LPN   04/24/7792   Nurse  Notes: None

## 2021-04-13 NOTE — Patient Instructions (Signed)
[name] , Thank you for taking time to come for your Medicare Wellness Visit. I appreciate your ongoing commitment to your health goals. Please review the following plan we discussed and let me know if I can assist you in the future.   Screening recommendations/referrals: Colonoscopy: No longer required Mammogram: Done 05/2020 - Repeat at [facility] every year Bone Density: Done 05/29/2018 - Repeat every 3 years at [place] yearly ophthalmology/optometry visit for glaucoma screening and checkup Recommended yearly dental visit for hygiene and checkup  Vaccinations: Influenza vaccine: Done 09/08/2020 - Repeat annually Pneumococcal vaccine: Done 10/22/2009 & 11/27/2014 Tdap vaccine: Done 11/27/2014 - Repeat in 10 years Shingles vaccine: Shingrix discussed. Please contact your pharmacy for coverage information.    Covid-19:Done 11/29/19, 12/31/19, & 09/13/2020 - check with pharmacy or health department for second booster.  Advanced directives: Please bring a copy of your health care power of attorney and living will to the office to be added to your chart at your convenience.  Conditions/risks identified: Aim for 30 minutes of exercise or brisk walking each day, drink 6-8 glasses of water and eat lots of fruits and vegetables.  Next appointment: Follow up in one year for your annual wellness visit    Preventive Care 65 Years and Older, Female Preventive care refers to lifestyle choices and visits with your health care provider that can promote health and wellness. What does preventive care include? A yearly physical exam. This is also called an annual well check. Dental exams once or twice a year. Routine eye exams. Ask your health care provider how often you should have your eyes checked. Personal lifestyle choices, including: Daily care of your teeth and gums. Regular physical activity. Eating a healthy diet. Avoiding tobacco and drug use. Limiting alcohol  use. Practicing safe sex. Taking low-dose aspirin every day. Taking vitamin and mineral supplements as recommended by your health care provider. What happens during an annual well check? The services and screenings done by your health care provider during your annual well check will depend on your age, overall health, lifestyle risk factors, and family history of disease. Counseling  Your health care provider may ask you questions about your: Alcohol use. Tobacco use. Drug use. Emotional well-being. Home and relationship well-being. Sexual activity. Eating habits. History of falls. Memory and ability to understand (cognition). Work and work Statistician. Reproductive health. Screening  You may have the following tests or measurements: Height, weight, and BMI. Blood pressure. Lipid and cholesterol levels. These may be checked every 5 years, or more frequently if you are over 20 years old. Skin check. Lung cancer screening. You may have this screening every year starting at age 58 if you have a 30-pack-year history of smoking and currently smoke or have quit within the past 15 years. Fecal occult blood test (FOBT) of the stool. You may have this test every year starting at age 26. Flexible sigmoidoscopy or colonoscopy. You may have a sigmoidoscopy every 5 years or a colonoscopy every 10 years starting at age 27. Hepatitis C blood test. Hepatitis B blood test. Sexually transmitted disease (STD) testing. Diabetes screening. This is done by checking your blood sugar (glucose) after you have not eaten for a while (fasting). You may have this done every 1-3 years. Bone density scan. This is done to screen for osteoporosis. You may have this done starting at age 4. Mammogram. This may be done every 1-2 years. Talk to your health care provider about how often you should have  regular mammograms. Talk with your health care provider about your test results, treatment options, and if necessary,  the need for more tests. Vaccines  Your health care provider may recommend certain vaccines, such as: Influenza vaccine. This is recommended every year. Tetanus, diphtheria, and acellular pertussis (Tdap, Td) vaccine. You may need a Td booster every 10 years. Zoster vaccine. You may need this after age 58. Pneumococcal 13-valent conjugate (PCV13) vaccine. One dose is recommended after age 32. Pneumococcal polysaccharide (PPSV23) vaccine. One dose is recommended after age 74. Talk to your health care provider about which screenings and vaccines you need and how often you need them. This information is not intended to replace advice given to you by your health care provider. Make sure you discuss any questions you have with your health care provider. Document Released: 11/07/2015 Document Revised: 06/30/2016 Document Reviewed: 08/12/2015 Elsevier Interactive Patient Education  2017 [employer] Prevention in the Home Falls can cause injuries. They can happen to people of all ages. There are many things you can do to make your home safe and to help prevent falls. What can I do on the outside of my home? Regularly fix the edges of walkways and driveways and fix any cracks. Remove anything that might make you trip as you walk through a door, such as a raised step or threshold. Trim any bushes or trees on the path to your home. Use bright outdoor lighting. Clear any walking paths of anything that might make someone trip, such as rocks or tools. Regularly check to see if handrails are loose or broken. Make sure that both sides of any steps have handrails. Any raised decks and porches should have guardrails on the edges. Have any leaves, snow, or ice cleared regularly. Use sand or salt on walking paths during winter. Clean up any spills in your garage right away. This includes oil or grease spills. What can I do in the bathroom? Use night lights. Install grab bars by the toilet and in the  tub and shower. Do not use towel bars as grab bars. Use non-skid mats or decals in the tub or shower. If you need to sit down in the shower, use a plastic, non-slip stool. Keep the floor dry. Clean up any water that spills on the floor as soon as it happens. Remove soap buildup in the tub or shower regularly. Attach bath mats securely with double-sided non-slip rug tape. Do not have throw rugs and other things on the floor that can make you trip. What can I do in the bedroom? Use night lights. Make sure that you have a light by your bed that is easy to reach. Do not use any sheets or blankets that are too big for your bed. They should not hang down onto the floor. Have a firm chair that has side arms. You can use this for support while you get dressed. Do not have throw rugs and other things on the floor that can make you trip. What can I do in the kitchen? Clean up any spills right away. Avoid walking on wet floors. Keep items that you use a lot in easy-to-reach places. If you need to reach something above you, use a strong step stool that has a grab bar. Keep electrical cords out of the way. Do not use floor polish or wax that makes floors slippery. If you must use wax, use non-skid floor wax. Do not have throw rugs and other things on the floor that  can make you trip. What can I do with my stairs? Do not leave any items on the stairs. Make sure that there are handrails on both sides of the stairs and use them. Fix handrails that are broken or loose. Make sure that handrails are as long as the stairways. Check any carpeting to make sure that it is firmly attached to the stairs. Fix any carpet that is loose or worn. Avoid having throw rugs at the top or bottom of the stairs. If you do have throw rugs, attach them to the floor with carpet tape. Make sure that you have a light switch at the top of the stairs and the bottom of the stairs. If you do not have them, ask someone to add them for  you. What else can I do to help prevent falls? Wear shoes that: Do not have high heels. Have rubber bottoms. Are comfortable and fit you well. Are closed at the toe. Do not wear sandals. If you use a stepladder: Make sure that it is fully opened. Do not climb a closed stepladder. Make sure that both sides of the stepladder are locked into place. Ask someone to hold it for you, if possible. Clearly mark and make sure that you can see: Any grab bars or handrails. First and last steps. Where the edge of each step is. Use tools that help you move around (mobility aids) if they are needed. These include: Canes. Walkers. Scooters. Crutches. Turn on the lights when you go into a dark area. Replace any light bulbs as soon as they burn out. Set up your furniture so you have a clear path. Avoid moving your furniture around. If any of your floors are uneven, fix them. If there are any pets around you, be aware of where they are. Review your medicines with your doctor. Some medicines can make you feel dizzy. This can increase your chance of falling. Ask your doctor what other things that you can do to help prevent falls. This information is not intended to replace advice given to you by your health care provider. Make sure you discuss any questions you have with your health care provider. Document Released: 08/07/2009 Document Revised: 03/18/2016 Document Reviewed: 11/15/2014 Elsevier Interactive Patient Education  2017 [employer].

## 2021-04-14 ENCOUNTER — Encounter: Payer: Self-pay | Admitting: Family

## 2021-04-14 ENCOUNTER — Ambulatory Visit (INDEPENDENT_AMBULATORY_CARE_PROVIDER_SITE_OTHER): Payer: Medicare Other | Admitting: Family

## 2021-04-14 ENCOUNTER — Other Ambulatory Visit: Payer: Self-pay

## 2021-04-14 DIAGNOSIS — F419 Anxiety disorder, unspecified: Secondary | ICD-10-CM | POA: Diagnosis not present

## 2021-04-14 NOTE — Patient Instructions (Signed)
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
## 2021-04-20 DIAGNOSIS — N3281 Overactive bladder: Secondary | ICD-10-CM | POA: Diagnosis not present

## 2021-04-21 ENCOUNTER — Telehealth: Payer: Self-pay | Admitting: Family Medicine

## 2021-04-21 MED ORDER — INSULIN PEN NEEDLE 31G X 5 MM MISC: 1.0000 | Freq: Four times a day (QID) | 4 refills | Status: AC

## 2021-04-21 NOTE — Telephone Encounter (Signed)
Pt aware refill sent to pharmacy 

## 2021-04-21 NOTE — Telephone Encounter (Signed)
  Prescription Request  04/21/2021  What is the name of the medication or equipment? Insulin Pen Needles-BD?  Have you contacted your pharmacy to request a refill? (if applicable) no  Which pharmacy would you like this sent to? [facility]   Patient notified that their request is being sent to the clinical staff for review and that they should receive a response within 2 business days.    [name]'s pt.

## 2021-04-22 ENCOUNTER — Other Ambulatory Visit: Payer: Self-pay

## 2021-04-22 ENCOUNTER — Telehealth: Payer: Self-pay | Admitting: Family Medicine

## 2021-04-22 DIAGNOSIS — E1122 Type 2 diabetes mellitus with diabetic chronic kidney disease: Secondary | ICD-10-CM

## 2021-04-22 DIAGNOSIS — N1831 Chronic kidney disease, stage 3a: Secondary | ICD-10-CM

## 2021-04-22 NOTE — Telephone Encounter (Signed)
Pt is wanting to come in to have her A1C checked. She said that she has an upcoming appt with her retina doctor and they will want. I told the pt that she could call their office and get them to send [place] an order but she wants the order put in by Dr Lajuana Ripple. Pt aware she may need an appt but wants to know if she can have this ordered without having to come in for an appt to see Dr Lajuana Ripple. Pt asked, if she does have to come in if she can see someone other than Dr Lajuana Ripple since she is booked until past the appt with retina doctor.

## 2021-04-24 ENCOUNTER — Other Ambulatory Visit: Payer: Medicare Other

## 2021-04-24 ENCOUNTER — Other Ambulatory Visit: Payer: Self-pay

## 2021-04-24 DIAGNOSIS — I152 Hypertension secondary to endocrine disorders: Secondary | ICD-10-CM | POA: Diagnosis not present

## 2021-04-24 DIAGNOSIS — E1122 Type 2 diabetes mellitus with diabetic chronic kidney disease: Secondary | ICD-10-CM

## 2021-04-24 DIAGNOSIS — Z794 Long term (current) use of insulin: Secondary | ICD-10-CM

## 2021-04-24 DIAGNOSIS — E1159 Type 2 diabetes mellitus with other circulatory complications: Secondary | ICD-10-CM

## 2021-04-24 DIAGNOSIS — N1831 Chronic kidney disease, stage 3a: Secondary | ICD-10-CM | POA: Diagnosis not present

## 2021-04-24 DIAGNOSIS — N1832 Chronic kidney disease, stage 3b: Secondary | ICD-10-CM | POA: Diagnosis not present

## 2021-04-24 DIAGNOSIS — E1169 Type 2 diabetes mellitus with other specified complication: Secondary | ICD-10-CM | POA: Diagnosis not present

## 2021-04-24 DIAGNOSIS — E785 Hyperlipidemia, unspecified: Secondary | ICD-10-CM | POA: Diagnosis not present

## 2021-04-24 LAB — BAYER DCA HB A1C WAIVED: HB A1C (BAYER DCA - WAIVED): 6.9 % (ref <7.0)

## 2021-04-25 LAB — RENAL FUNCTION PANEL
Albumin: 4.1 g/dL (ref 3.7–4.7)
BUN/Creatinine Ratio: 24 (ref 12–28)
BUN: 31 mg/dL — ABNORMAL HIGH (ref 8–27)
CO2: 25 mmol/L (ref 20–29)
Calcium: 9.7 mg/dL (ref 8.7–10.3)
Chloride: 97 mmol/L (ref 96–106)
Creatinine, Ser: 1.3 mg/dL — ABNORMAL HIGH (ref 0.57–1.00)
Glucose: 161 mg/dL — ABNORMAL HIGH (ref 65–99)
Phosphorus: 3.9 mg/dL (ref 3.0–4.3)
Potassium: 4.5 mmol/L (ref 3.5–5.2)
Sodium: 138 mmol/L (ref 134–144)
eGFR: 42 mL/min/{1.73_m2} — ABNORMAL LOW (ref >59)

## 2021-04-25 LAB — LIPID PANEL
Chol/HDL Ratio: 2 ratio (ref 0.0–4.4)
Cholesterol, Total: 144 mg/dL (ref 100–199)
HDL: 71 mg/dL (ref >39)
LDL Chol Calc (NIH): 59 mg/dL (ref 0–99)
Triglycerides: 73 mg/dL (ref 0–149)
VLDL Cholesterol Cal: 14 mg/dL (ref 5–40)

## 2021-04-30 ENCOUNTER — Encounter (INDEPENDENT_AMBULATORY_CARE_PROVIDER_SITE_OTHER): Payer: Medicare Other | Admitting: Ophthalmology

## 2021-04-30 ENCOUNTER — Other Ambulatory Visit: Payer: Self-pay

## 2021-04-30 DIAGNOSIS — H43813 Vitreous degeneration, bilateral: Secondary | ICD-10-CM | POA: Diagnosis not present

## 2021-04-30 DIAGNOSIS — E113311 Type 2 diabetes mellitus with moderate nonproliferative diabetic retinopathy with macular edema, right eye: Secondary | ICD-10-CM

## 2021-04-30 DIAGNOSIS — H35033 Hypertensive retinopathy, bilateral: Secondary | ICD-10-CM

## 2021-04-30 DIAGNOSIS — E113392 Type 2 diabetes mellitus with moderate nonproliferative diabetic retinopathy without macular edema, left eye: Secondary | ICD-10-CM

## 2021-04-30 DIAGNOSIS — I10 Essential (primary) hypertension: Secondary | ICD-10-CM

## 2021-05-28 DIAGNOSIS — E1142 Type 2 diabetes mellitus with diabetic polyneuropathy: Secondary | ICD-10-CM | POA: Diagnosis not present

## 2021-05-28 DIAGNOSIS — M79676 Pain in unspecified toe(s): Secondary | ICD-10-CM | POA: Diagnosis not present

## 2021-05-28 DIAGNOSIS — L84 Corns and callosities: Secondary | ICD-10-CM | POA: Diagnosis not present

## 2021-05-28 DIAGNOSIS — B351 Tinea unguium: Secondary | ICD-10-CM | POA: Diagnosis not present

## 2021-06-01 DIAGNOSIS — H04123 Dry eye syndrome of bilateral lacrimal glands: Secondary | ICD-10-CM | POA: Diagnosis not present

## 2021-06-05 ENCOUNTER — Other Ambulatory Visit: Payer: Self-pay

## 2021-06-05 ENCOUNTER — Encounter (INDEPENDENT_AMBULATORY_CARE_PROVIDER_SITE_OTHER): Payer: Medicare Other | Admitting: Ophthalmology

## 2021-06-05 DIAGNOSIS — H35033 Hypertensive retinopathy, bilateral: Secondary | ICD-10-CM

## 2021-06-05 DIAGNOSIS — I10 Essential (primary) hypertension: Secondary | ICD-10-CM

## 2021-06-05 DIAGNOSIS — H43813 Vitreous degeneration, bilateral: Secondary | ICD-10-CM | POA: Diagnosis not present

## 2021-06-05 DIAGNOSIS — E113311 Type 2 diabetes mellitus with moderate nonproliferative diabetic retinopathy with macular edema, right eye: Secondary | ICD-10-CM | POA: Diagnosis not present

## 2021-06-05 DIAGNOSIS — E113392 Type 2 diabetes mellitus with moderate nonproliferative diabetic retinopathy without macular edema, left eye: Secondary | ICD-10-CM | POA: Diagnosis not present

## 2021-06-23 ENCOUNTER — Telehealth: Payer: Self-pay | Admitting: Family Medicine

## 2021-06-23 MED ORDER — TRULICITY 1.5 MG/0.5ML ~~LOC~~ SOAJ: 1.5000 mg | SUBCUTANEOUS | 0 refills | Status: DC

## 2021-06-23 NOTE — Telephone Encounter (Signed)
Pt aware refill sent to pharmacy 

## 2021-06-23 NOTE — Telephone Encounter (Signed)
  Prescription Request  06/23/2021   What is the name of the medication or equipment? Trulicity  Have you contacted your pharmacy to request a refill? Yes  Which pharmacy would you like this sent to? [facility]   Patient notified that their request is being sent to the clinical staff for review and that they should receive a response within 2 business days.

## 2021-06-26 ENCOUNTER — Telehealth: Payer: Self-pay | Admitting: *Deleted

## 2021-06-26 NOTE — Telephone Encounter (Signed)
Probably an acute stress reaction causing elevation in BG's. Okay to add 2 extra units of Lantus today.  Resume normal dosing tomorrow.

## 2021-06-26 NOTE — Telephone Encounter (Signed)
Pt aware.

## 2021-06-26 NOTE — Telephone Encounter (Signed)
Patients CBG's always run around 120. She started throwing up and had some diarrhea last night. It has since subsided. Her sugar is 288 now. What should she do. Her endo office is closed today

## 2021-06-26 NOTE — Telephone Encounter (Signed)
NA

## 2021-06-26 NOTE — Telephone Encounter (Signed)
Pt returning call

## 2021-07-03 ENCOUNTER — Encounter (INDEPENDENT_AMBULATORY_CARE_PROVIDER_SITE_OTHER): Payer: Medicare Other | Admitting: Ophthalmology

## 2021-07-03 ENCOUNTER — Other Ambulatory Visit: Payer: Self-pay

## 2021-07-03 DIAGNOSIS — H43813 Vitreous degeneration, bilateral: Secondary | ICD-10-CM | POA: Diagnosis not present

## 2021-07-03 DIAGNOSIS — E113311 Type 2 diabetes mellitus with moderate nonproliferative diabetic retinopathy with macular edema, right eye: Secondary | ICD-10-CM

## 2021-07-03 DIAGNOSIS — I10 Essential (primary) hypertension: Secondary | ICD-10-CM | POA: Diagnosis not present

## 2021-07-03 DIAGNOSIS — E113392 Type 2 diabetes mellitus with moderate nonproliferative diabetic retinopathy without macular edema, left eye: Secondary | ICD-10-CM | POA: Diagnosis not present

## 2021-07-03 DIAGNOSIS — H35033 Hypertensive retinopathy, bilateral: Secondary | ICD-10-CM | POA: Diagnosis not present

## 2021-07-06 DIAGNOSIS — Z794 Long term (current) use of insulin: Secondary | ICD-10-CM | POA: Diagnosis not present

## 2021-07-06 DIAGNOSIS — E1140 Type 2 diabetes mellitus with diabetic neuropathy, unspecified: Secondary | ICD-10-CM | POA: Diagnosis not present

## 2021-07-15 ENCOUNTER — Encounter (INDEPENDENT_AMBULATORY_CARE_PROVIDER_SITE_OTHER): Payer: Medicare Other | Admitting: Ophthalmology

## 2021-07-21 ENCOUNTER — Other Ambulatory Visit: Payer: Self-pay

## 2021-07-21 ENCOUNTER — Ambulatory Visit (INDEPENDENT_AMBULATORY_CARE_PROVIDER_SITE_OTHER): Payer: Medicare Other | Admitting: Family Medicine

## 2021-07-21 ENCOUNTER — Encounter: Payer: Self-pay | Admitting: Family Medicine

## 2021-07-21 VITALS — BP 126/73 | HR 77 | Temp 97.4°F | Ht 63.0 in | Wt 141.2 lb

## 2021-07-21 DIAGNOSIS — N1831 Chronic kidney disease, stage 3a: Secondary | ICD-10-CM | POA: Diagnosis not present

## 2021-07-21 DIAGNOSIS — I152 Hypertension secondary to endocrine disorders: Secondary | ICD-10-CM

## 2021-07-21 DIAGNOSIS — E1169 Type 2 diabetes mellitus with other specified complication: Secondary | ICD-10-CM

## 2021-07-21 DIAGNOSIS — E1122 Type 2 diabetes mellitus with diabetic chronic kidney disease: Secondary | ICD-10-CM

## 2021-07-21 DIAGNOSIS — B372 Candidiasis of skin and nail: Secondary | ICD-10-CM

## 2021-07-21 DIAGNOSIS — E11319 Type 2 diabetes mellitus with unspecified diabetic retinopathy without macular edema: Secondary | ICD-10-CM | POA: Diagnosis not present

## 2021-07-21 DIAGNOSIS — E1159 Type 2 diabetes mellitus with other circulatory complications: Secondary | ICD-10-CM

## 2021-07-21 DIAGNOSIS — E785 Hyperlipidemia, unspecified: Secondary | ICD-10-CM

## 2021-07-21 DIAGNOSIS — E1142 Type 2 diabetes mellitus with diabetic polyneuropathy: Secondary | ICD-10-CM | POA: Diagnosis not present

## 2021-07-21 DIAGNOSIS — Z794 Long term (current) use of insulin: Secondary | ICD-10-CM

## 2021-07-21 LAB — BAYER DCA HB A1C WAIVED: HB A1C (BAYER DCA - WAIVED): 5.9 % — ABNORMAL HIGH (ref 4.8–5.6)

## 2021-07-21 MED ORDER — NYSTATIN 100000 UNIT/GM EX POWD: CUTANEOUS | 1 refills | Status: DC

## 2021-07-21 MED ORDER — NOVOLOG FLEXPEN 100 UNIT/ML ~~LOC~~ SOPN: PEN_INJECTOR | SUBCUTANEOUS | 99 refills | Status: DC

## 2021-07-21 NOTE — Progress Notes (Signed)
Subjective: CC: DM PCP: [name], DO WFU:[id number] [name] is a 80 y.o. female presenting to clinic today for:  1. Type 2 Diabetes with hypertension, hyperlipidemia, CKD3a:  Reports compliance with her medications.  She has been trying to work on diet modification has had some weight loss.  She unfortunately did suffer from an acute GI illness recently and lost about 4 pounds during that time as well.  She notes an active intertrigo infection underneath her breast and asked for refill on her Nystop powder.  Needs also refills on NovoLog.  Last eye exam: UTD Last foot exam: needs Last A1c:  Lab Results  Component Value Date   HGBA1C 6.9 04/24/2021   Nephropathy screen indicated?: needs Last flu, zoster and/or pneumovax:  Immunization History  Administered Date(s) Administered   Fluad Quad(high Dose 65+) 07/09/2019, 09/08/2020   Influenza, High Dose Seasonal PF 07/19/2016, 10/13/2017, 08/28/2018   Influenza,inj,Quad PF,6+ Mos 07/30/2013, 07/24/2014, 08/04/2015   Moderna SARS-COV2 Booster Vaccination 09/13/2020   Moderna Sars-Covid-2 Vaccination 11/29/2019, 12/31/2019   Pneumococcal Conjugate-13 11/27/2014   Pneumococcal Polysaccharide-23 10/22/2009   Td 10/25/2004   Tdap 11/27/2014   Zoster, Live 04/06/2012    ROS: No chest pain, shortness of breath.  Had urine microalbumin and A1c performed at her endocrinologist office a couple of weeks ago.  ROS: Per HPI  Allergies  Allergen Reactions   Latex Anaphylaxis and Rash   Ambien [Zolpidem] Other (See Comments)    Hallucinations   Augmentin [Amoxicillin-Pot Clavulanate] Nausea And Vomiting   Belsomra [Suvorexant] Other (See Comments)    Hallucinations   Hydrocodone-Acetaminophen Other (See Comments)    hallucinations   Hyoscyamine Sulfate Other (See Comments)   Melatonin Other (See Comments)    Adverse reaction-"keeps me awake"   Mobic [Meloxicam]    Morphine Nausea And Vomiting    REACTION: hallucinations,GI  upset   Oxycodone Other (See Comments)    Hallucinations   Tramadol     Unknown reaction   Celebrex [Celecoxib] Itching and Rash   Sulfa Antibiotics Rash   Past Medical History:  Diagnosis Date   Acute cystitis    Anxiety    Depression    Diabetes mellitus type II    Diabetic retinopathy    DM type 2 causing CKD stage 3 (HCC)    Fibromyalgia    GERD (gastroesophageal reflux disease)    Hyperlipidemia    Hypertension    IBS (irritable bowel syndrome)    IBS (irritable bowel syndrome)    Insomnia    Lichen planus    vulvar   Pancreatitis, gallstone    Postmenopausal    Radial scar of breast 06/04/2016   Urge incontinence of urine     Current Outpatient Medications:    acetaminophen (TYLENOL) 325 MG tablet, Take 325 mg by mouth as needed., Disp: , Rfl:    amLODipine (NORVASC) 5 MG tablet, Take 1 tablet (5 mg total) by mouth daily., Disp: 90 tablet, Rfl: 3   aspirin 81 MG tablet, Take 81 mg by mouth every other day., Disp: , Rfl:    atorvastatin (LIPITOR) 10 MG tablet, Take 1 tablet (10 mg total) by mouth at bedtime., Disp: 90 tablet, Rfl: 3   BESIVANCE 0.6 % SUSP, Apply to eye., Disp: , Rfl:    Calcium Carb-Cholecalciferol 600-200 MG-UNIT TABS, Take 1 tablet by mouth 2 (two) times daily., Disp: , Rfl:    cephALEXin (KEFLEX) 500 MG capsule, Take 500 mg by mouth daily. For UTI prevention, Disp: ,  Rfl:    desonide (DESOWEN) 0.05 % cream, Apply topically 2 (two) times daily., Disp: 30 g, Rfl: 1   diclofenac Sodium (VOLTAREN) 1 % GEL, Apply 4 g topically 4 (four) times daily as needed (arthritis)., Disp: 400 g, Rfl: 3   Dulaglutide (TRULICITY) 1.5 ME/2.6ST SOPN, Inject 1.5 mg into the skin once a week., Disp: 3 mL, Rfl: 0   escitalopram (LEXAPRO) 20 MG tablet, Take 1 tablet (20 mg total) by mouth daily., Disp: 90 tablet, Rfl: 3   esomeprazole (NEXIUM) 20 MG capsule, Take 1 capsule (20 mg total) by mouth daily at 12 noon., Disp: 90 capsule, Rfl: 3   estradiol (ESTRACE) 0.1 MG/GM  vaginal cream, Place 2 g vaginally every Monday, Wednesday, and Friday., Disp: 42.5 g, Rfl: 1   fluticasone (FLONASE) 50 MCG/ACT nasal spray, Place 2 sprays into both nostrils daily., Disp: 16 g, Rfl: 6   glucose blood (FREESTYLE LITE) test strip, Use qid to check blood sugar. Dx E11.9, Disp: 400 each, Rfl: 3   insulin aspart (NOVOLOG FLEXPEN) 100 UNIT/ML FlexPen, BG less than 80 - no insulin; 80 to 120 - 3 units; 121 to 150 - 4 units; 151 to 200 - 5 units; 201 to 250 - 6 units; 250 or above - 7 units, Disp: 30 mL, Rfl: 0   insulin glargine (LANTUS SOLOSTAR) 100 UNIT/ML Solostar Pen, Inject 26 Units into the skin every morning., Disp: 45 mL, Rfl: 3   Insulin Pen Needle 31G X 5 MM MISC, 1 Device by Does not apply route 4 (four) times daily., Disp: 300 each, Rfl: 4   Multiple Vitamin (MULTIVITAMIN) tablet, Take 1 tablet by mouth daily., Disp: , Rfl:    nystatin (NYSTATIN) powder, APPLY UNDER BILATERAL BREASTS AND GROIN TWICE A DAY (If needed), Disp: 60 g, Rfl: 1   nystatin-triamcinolone ointment (MYCOLOG), APPLY TOPICALLY 2 (TWO) TIMES DAILY IF NEEDED FOR rash.  NOT A PREVENTATIVE MEDICATION., Disp: 60 g, Rfl: 3   ondansetron (ZOFRAN ODT) 4 MG disintegrating tablet, Take 1 tablet (4 mg total) by mouth every 8 (eight) hours as needed for nausea or vomiting., Disp: 30 tablet, Rfl: 0   Polyethyl Glycol-Propyl Glycol 0.4-0.3 % SOLN, Apply 1 drop to eye daily as needed (for dry eye relief). , Disp: , Rfl:    polyethylene glycol (MIRALAX / GLYCOLAX) packet, Take 17 g by mouth daily as needed. , Disp: , Rfl:    Probiotic Product (PROBIOTIC ADVANCED PO), Take 1 capsule by mouth daily. , Disp: , Rfl:    traZODone (DESYREL) 100 MG tablet, Take 1 tablet (100 mg total) by mouth at bedtime., Disp: 90 tablet, Rfl: 1   Vibegron 75 MG TABS, Take by mouth., Disp: , Rfl:  Social History   Socioeconomic History   Marital status: Widowed    Spouse name: Not on file   Number of children: 3   Years of education: 37    Highest education level: Some college, no degree  Occupational History   Occupation: retired    Fish farm manager: RETIRED    Comment: bookeeping  Tobacco Use   Smoking status: Never   Smokeless tobacco: Never  Vaping Use   Vaping Use: Never used  Substance and Sexual Activity   Alcohol use: No   Drug use: No   Sexual activity: Not Currently  Other Topics Concern   Not on file  Social History Narrative   Widowed in 2010; lives alone. Children live out of town   Social Determinants of Health  Financial Resource Strain: Not on file  Food Insecurity: Not on file  Transportation Needs: Not on file  Physical Activity: Insufficiently Active   Days of Exercise per Week: 7 days   Minutes of Exercise per Session: 20 min  Stress: Stress Concern Present   Feeling of Stress : To some extent  Social Connections: Socially Isolated   Frequency of Communication with Friends and Family: More than three times a week   Frequency of Social Gatherings with Friends and Family: Once a week   Attends Religious Services: Never   Marine scientist or Organizations: No   Attends Archivist Meetings: Never   Marital Status: Widowed  Human resources officer Violence: Not At Risk   Fear of Current or Ex-Partner: No   Emotionally Abused: No   Physically Abused: No   Sexually Abused: No   Family History  Problem Relation Age of Onset   Diabetes Mother    Hypertension Mother    Kidney disease Mother    Heart disease Mother    Peripheral vascular disease Mother        amputation   Cancer Father        prostate   Diabetes Father    Heart disease Father        CABG   Hypertension Father    Heart attack Father    Depression Sister    Hypertension Sister    Neuropathy Sister     Objective: Office vital signs reviewed. BP 126/73   Pulse 77   Temp (!) 97.4 F (36.3 [name])   Ht 5\' 3"  (1.6 m)   Wt 141 lb 3.2 oz (64 kg)   SpO2 99%   BMI 25.01 kg/m   Physical Examination:  General: Awake,  alert, well nourished, No acute distress HEENT: Normal; sclera white.  Moist mucous membranes Cardio: regular rate and rhythm, S1S2 heard, no murmurs appreciated Pulm: clear to auscultation bilaterally, no wheezes, rhonchi or rales; normal work of breathing on room air Extremities: warm, well perfused, No edema, cyanosis or clubbing; +2 pulses bilaterally MSK: Ambulating independently  Assessment/ Plan: 80 y.o. female   Type 2 diabetes mellitus with stage 3a chronic kidney disease, with long-term current use of insulin ([place]) - Plan: Renal Function Panel, insulin aspart (NOVOLOG FLEXPEN) 100 UNIT/ML FlexPen, CANCELED: Microalbumin / creatinine urine ratio, CANCELED: Bayer DCA Hb A1c Waived  Hyperlipidemia associated with type 2 diabetes mellitus ([place])  Hypertension associated with diabetes ([place]) - Plan: Renal Function Panel  Diabetic polyneuropathy associated with type 2 diabetes mellitus ([place])  Diabetic retinopathy without macular edema associated with type 2 diabetes mellitus, unspecified laterality, unspecified retinopathy severity ([place])  Intertriginous candidiasis - Plan: nystatin powder  Sugar under control.  No changes needed.  NovoLog renewed.  I have canceled her A1c and urine microalbumin as this is already been collected.  Check renal function panel given history of CKD 3 a  Not yet due for fasting lipid.    Blood pressure controlled.  Continue current regimen  Continue with annual foot and eye exams given history of polyneuropathy and retinopathy.  Nystatin powder renewed for intertrigo  No orders of the defined types were placed in this encounter.  No orders of the defined types were placed in this encounter.    [name], DO [place] [phone]

## 2021-07-21 NOTE — Patient Instructions (Signed)
No more pneumonia shots needed  Flu shot given  Recommend doing the Shingles vaccine separately.  This will be a 2 shot series.

## 2021-07-22 LAB — RENAL FUNCTION PANEL
Albumin: 4.1 g/dL (ref 3.6–4.6)
BUN/Creatinine Ratio: 30 — ABNORMAL HIGH (ref 12–28)
BUN: 33 mg/dL — ABNORMAL HIGH (ref 8–27)
CO2: 30 mmol/L — ABNORMAL HIGH (ref 20–29)
Calcium: 9.4 mg/dL (ref 8.7–10.3)
Chloride: 99 mmol/L (ref 96–106)
Creatinine, Ser: 1.09 mg/dL — ABNORMAL HIGH (ref 0.57–1.00)
Glucose: 170 mg/dL — ABNORMAL HIGH (ref 70–99)
Phosphorus: 4.3 mg/dL (ref 3.0–4.3)
Potassium: 4.3 mmol/L (ref 3.5–5.2)
Sodium: 142 mmol/L (ref 134–144)
eGFR: 51 mL/min/{1.73_m2} — ABNORMAL LOW (ref >59)

## 2021-07-27 ENCOUNTER — Other Ambulatory Visit: Payer: Self-pay | Admitting: Family Medicine

## 2021-07-27 MED ORDER — TRULICITY 1.5 MG/0.5ML ~~LOC~~ SOAJ: 1.5000 mg | SUBCUTANEOUS | 3 refills | Status: DC

## 2021-07-27 NOTE — Telephone Encounter (Signed)
Pt aware med sent to [facility] for a yr

## 2021-07-27 NOTE — Telephone Encounter (Signed)
  Prescription Request  07/27/2021  Is this a "Controlled Substance" medicine? No  Have you seen your PCP in the last 2 weeks? no If YES, route message to pool  -  If NO, patient needs to be scheduled for appointment.  What is the name of the medication or equipment? Trulicity RX  Have you contacted your pharmacy to request a refill? No  Which pharmacy would you like this sent to? [facility]   Patient notified that their request is being sent to the clinical staff for review and that they should receive a response within 2 business days.    [name] pt.  She wants 30mth supply w/x3 refills

## 2021-07-31 ENCOUNTER — Encounter (INDEPENDENT_AMBULATORY_CARE_PROVIDER_SITE_OTHER): Payer: Medicare Other | Admitting: Ophthalmology

## 2021-07-31 ENCOUNTER — Other Ambulatory Visit: Payer: Self-pay

## 2021-07-31 DIAGNOSIS — E113292 Type 2 diabetes mellitus with mild nonproliferative diabetic retinopathy without macular edema, left eye: Secondary | ICD-10-CM | POA: Diagnosis not present

## 2021-07-31 DIAGNOSIS — I10 Essential (primary) hypertension: Secondary | ICD-10-CM

## 2021-07-31 DIAGNOSIS — H43813 Vitreous degeneration, bilateral: Secondary | ICD-10-CM | POA: Diagnosis not present

## 2021-07-31 DIAGNOSIS — E113311 Type 2 diabetes mellitus with moderate nonproliferative diabetic retinopathy with macular edema, right eye: Secondary | ICD-10-CM

## 2021-07-31 DIAGNOSIS — H35033 Hypertensive retinopathy, bilateral: Secondary | ICD-10-CM | POA: Diagnosis not present

## 2021-08-06 ENCOUNTER — Ambulatory Visit: Payer: Medicare Other

## 2021-08-13 DIAGNOSIS — L84 Corns and callosities: Secondary | ICD-10-CM | POA: Diagnosis not present

## 2021-08-13 DIAGNOSIS — M79676 Pain in unspecified toe(s): Secondary | ICD-10-CM | POA: Diagnosis not present

## 2021-08-13 DIAGNOSIS — B351 Tinea unguium: Secondary | ICD-10-CM | POA: Diagnosis not present

## 2021-08-13 DIAGNOSIS — E1142 Type 2 diabetes mellitus with diabetic polyneuropathy: Secondary | ICD-10-CM | POA: Diagnosis not present

## 2021-08-14 ENCOUNTER — Ambulatory Visit: Payer: Medicare Other

## 2021-08-19 ENCOUNTER — Ambulatory Visit (INDEPENDENT_AMBULATORY_CARE_PROVIDER_SITE_OTHER): Payer: Medicare Other | Admitting: *Deleted

## 2021-08-19 ENCOUNTER — Other Ambulatory Visit: Payer: Self-pay

## 2021-08-19 DIAGNOSIS — Z23 Encounter for immunization: Secondary | ICD-10-CM | POA: Diagnosis not present

## 2021-08-21 ENCOUNTER — Other Ambulatory Visit: Payer: Self-pay

## 2021-08-21 ENCOUNTER — Encounter: Payer: Self-pay | Admitting: Family Medicine

## 2021-08-21 ENCOUNTER — Ambulatory Visit (INDEPENDENT_AMBULATORY_CARE_PROVIDER_SITE_OTHER): Payer: Medicare Other | Admitting: Family Medicine

## 2021-08-21 ENCOUNTER — Other Ambulatory Visit (HOSPITAL_COMMUNITY)
Admission: RE | Admit: 2021-08-21 | Discharge: 2021-08-21 | Disposition: A | Discharge status: Home / Self Care | Payer: Medicare Other | Source: Ambulatory Visit | Attending: Family Medicine | Admitting: Family Medicine

## 2021-08-21 VITALS — BP 133/72 | HR 79 | Ht 63.0 in | Wt 141.0 lb

## 2021-08-21 DIAGNOSIS — N898 Other specified noninflammatory disorders of vagina: Secondary | ICD-10-CM | POA: Diagnosis not present

## 2021-08-21 MED ORDER — FLUCONAZOLE 150 MG PO TABS: 150.0000 mg | ORAL_TABLET | Freq: Once | ORAL | 0 refills | Status: AC

## 2021-08-21 NOTE — Progress Notes (Signed)
BP 133/72   Pulse 79   Ht 5\' 3"  (1.6 m)   Wt 141 lb (64 kg)   SpO2 95%   BMI 24.98 kg/m    Subjective:   Patient ID: [name], female    DOB: Jan 27, 1941, 80 y.o.   MRN: [id number]  HPI: [name] is a 80 y.o. female presenting on 08/21/2021 for No chief complaint on file.   HPI Vaginal irritation and discharge Patient is coming in today with complaints of vaginal irritation and discharge that has been going on over the past few days.  She says she has noticed a small amount of discharge but her biggest issue has been the vaginal itching and irritation.  She says the vaginal itching and irritation has been increasing and even woke her up last night because of how irritated she was.  Relevant past medical, surgical, family and social history reviewed and updated as indicated. Interim medical history since our last visit reviewed. Allergies and medications reviewed and updated.  Review of Systems  Constitutional:  Negative for chills and fever.  Eyes:  Negative for redness and visual disturbance.  Respiratory:  Negative for chest tightness and shortness of breath.   Cardiovascular:  Negative for chest pain and leg swelling.  Gastrointestinal:  Negative for abdominal pain.  Genitourinary:  Positive for vaginal discharge. Negative for difficulty urinating, dysuria, vaginal bleeding and vaginal pain.  Musculoskeletal:  Negative for back pain and gait problem.  Skin:  Negative for rash.  Neurological:  Negative for light-headedness and headaches.  Psychiatric/Behavioral:  Negative for agitation and behavioral problems.   All other systems reviewed and are negative.  Per HPI unless specifically indicated above   Allergies as of 08/21/2021       Reactions   Latex Anaphylaxis, Rash   Ambien [zolpidem] Other (See Comments)   Hallucinations   Augmentin [amoxicillin-pot Clavulanate] Nausea And Vomiting   Belsomra [suvorexant] Other (See Comments)   Hallucinations    Hydrocodone-acetaminophen Other (See Comments)   hallucinations   Hyoscyamine Sulfate Other (See Comments)   Melatonin Other (See Comments)   Adverse reaction-"keeps me awake"   Mobic [meloxicam]    Morphine Nausea And Vomiting   REACTION: hallucinations,GI upset   Oxycodone Other (See Comments)   Hallucinations   Tramadol    Unknown reaction   Celebrex [celecoxib] Itching, Rash   Sulfa Antibiotics Rash        Medication List        Accurate as of August 21, 2021  2:39 PM. If you have any questions, ask your nurse or doctor.          acetaminophen 325 MG tablet Commonly known as: TYLENOL Take 325 mg by mouth as needed.   amLODipine 5 MG tablet Commonly known as: NORVASC Take 1 tablet (5 mg total) by mouth daily.   aspirin 81 MG tablet Take 81 mg by mouth every other day.   atorvastatin 10 MG tablet Commonly known as: LIPITOR Take 1 tablet (10 mg total) by mouth at bedtime.   Besivance 0.6 % Susp Generic drug: Besifloxacin HCl Apply to eye.   Calcium Carb-Cholecalciferol 600-200 MG-UNIT Tabs Take 1 tablet by mouth 2 (two) times daily.   cephALEXin 500 MG capsule Commonly known as: KEFLEX Take 500 mg by mouth daily. For UTI prevention   desonide 0.05 % cream Commonly known as: DesOwen Apply topically 2 (two) times daily.   diclofenac Sodium 1 % Gel Commonly known as: Voltaren Apply 4 g  topically 4 (four) times daily as needed (arthritis).   escitalopram 20 MG tablet Commonly known as: LEXAPRO Take 1 tablet (20 mg total) by mouth daily.   esomeprazole 20 MG capsule Commonly known as: NexIUM Take 1 capsule (20 mg total) by mouth daily at 12 noon.   estradiol 0.1 MG/GM vaginal cream Commonly known as: ESTRACE Place 2 g vaginally every Monday, Wednesday, and Friday.   fluconazole 150 MG tablet Commonly known as: Diflucan Take 1 tablet (150 mg total) by mouth once for 1 dose. Started by: Fransisca Kaufmann Bladen Umar, MD   fluticasone 50 MCG/ACT nasal  spray Commonly known as: FLONASE Place 2 sprays into both nostrils daily.   FREESTYLE LITE test strip Generic drug: glucose blood Use qid to check blood sugar. Dx E11.9   Insulin Pen Needle 31G X 5 MM Misc 1 Device by Does not apply route 4 (four) times daily.   Lantus SoloStar 100 UNIT/ML Solostar Pen Generic drug: insulin glargine Inject 26 Units into the skin every morning.   multivitamin tablet Take 1 tablet by mouth daily.   NovoLOG FlexPen 100 UNIT/ML FlexPen Generic drug: insulin aspart BG less than 80 - no insulin; 80 to 120 - 3 units; 121 to 150 - 4 units; 151 to 200 - 5 units; 201 to 250 - 6 units; 250 or above - 7 units   nystatin powder Commonly known as: nystatin APPLY UNDER BILATERAL BREASTS AND GROIN TWICE A DAY (If needed)   nystatin-triamcinolone ointment Commonly known as: MYCOLOG APPLY TOPICALLY 2 (TWO) TIMES DAILY IF NEEDED FOR rash.  NOT A PREVENTATIVE MEDICATION.   ondansetron 4 MG disintegrating tablet Commonly known as: Zofran ODT Take 1 tablet (4 mg total) by mouth every 8 (eight) hours as needed for nausea or vomiting.   Polyethyl Glycol-Propyl Glycol 0.4-0.3 % Soln Apply 1 drop to eye daily as needed (for dry eye relief).   polyethylene glycol 17 g packet Commonly known as: MIRALAX / GLYCOLAX Take 17 g by mouth daily as needed.   PROBIOTIC ADVANCED PO Take 1 capsule by mouth daily.   traZODone 100 MG tablet Commonly known as: DESYREL Take 1 tablet (100 mg total) by mouth at bedtime.   Trulicity 1.5 BL/3.9QZ Sopn Generic drug: Dulaglutide Inject 1.5 mg into the skin once a week.   Vibegron 75 MG Tabs Take by mouth.         Objective:   BP 133/72   Pulse 79   Ht 5\' 3"  (1.6 m)   Wt 141 lb (64 kg)   SpO2 95%   BMI 24.98 kg/m   Wt Readings from Last 3 Encounters:  08/21/21 141 lb (64 kg)  07/21/21 141 lb 3.2 oz (64 kg)  04/14/21 149 lb 8 oz (67.8 kg)    Physical Exam Vitals and nursing note reviewed. Exam conducted with  a chaperone present.  Constitutional:      Appearance: Normal appearance. She is normal weight.  Genitourinary:    Exam position: Lithotomy position.     Labia:        Right: Rash present. No tenderness or lesion.        Left: Rash present. No tenderness or lesion.      Vagina: No signs of injury. Vaginal discharge and tenderness present. No erythema, lesions or prolapsed vaginal walls.  Neurological:     Mental Status: She is alert.      Assessment & Plan:   Problem List Items Addressed This Visit   None Visit  Diagnoses     Vaginal irritation    -  Primary   Relevant Orders   Cervicovaginal ancillary only       Will treat like possible yeast because of the amount that she is irritated but concerned that she may be just having postmenopausal vaginal dryness. Follow up plan: Return if symptoms worsen or fail to improve.  Counseling provided for all of the vaccine components No orders of the defined types were placed in this encounter.   Caryl Pina, MD [facility] 08/21/2021, 2:39 PM

## 2021-08-25 LAB — CERVICOVAGINAL ANCILLARY ONLY
Bacterial Vaginitis (gardnerella): NEGATIVE
Candida Glabrata: NEGATIVE
Candida Vaginitis: NEGATIVE
Comment: NEGATIVE
Comment: NEGATIVE
Comment: NEGATIVE

## 2021-08-28 ENCOUNTER — Other Ambulatory Visit: Payer: Self-pay

## 2021-08-28 ENCOUNTER — Encounter (INDEPENDENT_AMBULATORY_CARE_PROVIDER_SITE_OTHER): Payer: Medicare Other | Admitting: Ophthalmology

## 2021-08-28 DIAGNOSIS — I10 Essential (primary) hypertension: Secondary | ICD-10-CM

## 2021-08-28 DIAGNOSIS — E113292 Type 2 diabetes mellitus with mild nonproliferative diabetic retinopathy without macular edema, left eye: Secondary | ICD-10-CM

## 2021-08-28 DIAGNOSIS — H43813 Vitreous degeneration, bilateral: Secondary | ICD-10-CM

## 2021-08-28 DIAGNOSIS — E113311 Type 2 diabetes mellitus with moderate nonproliferative diabetic retinopathy with macular edema, right eye: Secondary | ICD-10-CM

## 2021-08-28 DIAGNOSIS — H35033 Hypertensive retinopathy, bilateral: Secondary | ICD-10-CM

## 2021-09-01 ENCOUNTER — Telehealth: Payer: Self-pay | Admitting: Family Medicine

## 2021-09-01 DIAGNOSIS — F5101 Primary insomnia: Secondary | ICD-10-CM

## 2021-09-01 DIAGNOSIS — N183 Chronic kidney disease, stage 3 unspecified: Secondary | ICD-10-CM

## 2021-09-01 DIAGNOSIS — E1122 Type 2 diabetes mellitus with diabetic chronic kidney disease: Secondary | ICD-10-CM

## 2021-09-01 MED ORDER — TRAZODONE HCL 100 MG PO TABS: 100.0000 mg | ORAL_TABLET | Freq: Every day | ORAL | 0 refills | Status: DC

## 2021-09-01 MED ORDER — LANTUS SOLOSTAR 100 UNIT/ML ~~LOC~~ SOPN
26.0000 [IU] | PEN_INJECTOR | SUBCUTANEOUS | 3 refills | Status: DC
Start: 2021-09-01 — End: 2022-11-10

## 2021-09-01 NOTE — Telephone Encounter (Signed)
Patient aware rx sent to pharmacy.  

## 2021-09-01 NOTE — Telephone Encounter (Signed)
  Prescription Request  09/01/2021  Is this a "Controlled Substance" medicine? no  Have you seen your PCP in the last 2 weeks? no  If YES, route message to pool  -  If NO, patient needs to be scheduled for appointment.  What is the name of the medication or equipment? insulin glargine (LANTUS SOLOSTAR) 100 UNIT/ML Solostar Pen & traZODone (DESYREL) 100 MG tablet  Have you contacted your pharmacy to request a refill? yes   Which pharmacy would you like this sent to? [facility] - [place], [address]   Patient notified that their request is being sent to the clinical staff for review and that they should receive a response within 2 business days.

## 2021-09-02 ENCOUNTER — Other Ambulatory Visit: Payer: Self-pay | Admitting: Family Medicine

## 2021-09-02 DIAGNOSIS — N761 Subacute and chronic vaginitis: Secondary | ICD-10-CM

## 2021-09-02 MED ORDER — ESTRADIOL 0.1 MG/GM VA CREA: 2.0000 g | TOPICAL_CREAM | VAGINAL | 1 refills | Status: DC

## 2021-09-27 DIAGNOSIS — E1140 Type 2 diabetes mellitus with diabetic neuropathy, unspecified: Secondary | ICD-10-CM | POA: Diagnosis not present

## 2021-09-28 ENCOUNTER — Encounter (INDEPENDENT_AMBULATORY_CARE_PROVIDER_SITE_OTHER): Payer: Medicare Other | Admitting: Ophthalmology

## 2021-09-28 ENCOUNTER — Other Ambulatory Visit: Payer: Self-pay

## 2021-09-28 DIAGNOSIS — I10 Essential (primary) hypertension: Secondary | ICD-10-CM

## 2021-09-28 DIAGNOSIS — E113392 Type 2 diabetes mellitus with moderate nonproliferative diabetic retinopathy without macular edema, left eye: Secondary | ICD-10-CM | POA: Diagnosis not present

## 2021-09-28 DIAGNOSIS — E113311 Type 2 diabetes mellitus with moderate nonproliferative diabetic retinopathy with macular edema, right eye: Secondary | ICD-10-CM | POA: Diagnosis not present

## 2021-09-28 DIAGNOSIS — H35033 Hypertensive retinopathy, bilateral: Secondary | ICD-10-CM

## 2021-09-28 DIAGNOSIS — H43813 Vitreous degeneration, bilateral: Secondary | ICD-10-CM | POA: Diagnosis not present

## 2021-10-28 ENCOUNTER — Encounter (INDEPENDENT_AMBULATORY_CARE_PROVIDER_SITE_OTHER): Admitting: Ophthalmology

## 2021-10-29 DIAGNOSIS — L84 Corns and callosities: Secondary | ICD-10-CM | POA: Diagnosis not present

## 2021-10-29 DIAGNOSIS — M79676 Pain in unspecified toe(s): Secondary | ICD-10-CM | POA: Diagnosis not present

## 2021-10-29 DIAGNOSIS — E1142 Type 2 diabetes mellitus with diabetic polyneuropathy: Secondary | ICD-10-CM | POA: Diagnosis not present

## 2021-10-29 DIAGNOSIS — B351 Tinea unguium: Secondary | ICD-10-CM | POA: Diagnosis not present

## 2021-10-30 ENCOUNTER — Encounter: Payer: Self-pay | Admitting: *Deleted

## 2021-10-30 ENCOUNTER — Ambulatory Visit (INDEPENDENT_AMBULATORY_CARE_PROVIDER_SITE_OTHER): Payer: Medicare Other | Admitting: *Deleted

## 2021-10-30 DIAGNOSIS — Z23 Encounter for immunization: Secondary | ICD-10-CM | POA: Diagnosis not present

## 2021-10-30 DIAGNOSIS — N3281 Overactive bladder: Secondary | ICD-10-CM | POA: Diagnosis not present

## 2021-11-02 ENCOUNTER — Other Ambulatory Visit: Payer: Self-pay

## 2021-11-02 ENCOUNTER — Encounter (INDEPENDENT_AMBULATORY_CARE_PROVIDER_SITE_OTHER): Payer: Medicare Other | Admitting: Ophthalmology

## 2021-11-02 DIAGNOSIS — E113392 Type 2 diabetes mellitus with moderate nonproliferative diabetic retinopathy without macular edema, left eye: Secondary | ICD-10-CM

## 2021-11-02 DIAGNOSIS — E1140 Type 2 diabetes mellitus with diabetic neuropathy, unspecified: Secondary | ICD-10-CM | POA: Diagnosis not present

## 2021-11-02 DIAGNOSIS — I10 Essential (primary) hypertension: Secondary | ICD-10-CM

## 2021-11-02 DIAGNOSIS — E113311 Type 2 diabetes mellitus with moderate nonproliferative diabetic retinopathy with macular edema, right eye: Secondary | ICD-10-CM | POA: Diagnosis not present

## 2021-11-02 DIAGNOSIS — H35033 Hypertensive retinopathy, bilateral: Secondary | ICD-10-CM

## 2021-11-02 DIAGNOSIS — H43813 Vitreous degeneration, bilateral: Secondary | ICD-10-CM

## 2021-11-27 ENCOUNTER — Other Ambulatory Visit: Payer: Self-pay | Admitting: Family Medicine

## 2021-11-27 DIAGNOSIS — F5101 Primary insomnia: Secondary | ICD-10-CM

## 2021-11-27 DIAGNOSIS — N761 Subacute and chronic vaginitis: Secondary | ICD-10-CM

## 2021-11-27 MED ORDER — ESTRADIOL 0.1 MG/GM VA CREA: 2.0000 g | TOPICAL_CREAM | VAGINAL | 0 refills | Status: DC

## 2021-11-27 NOTE — Telephone Encounter (Signed)
°  Prescription Request  11/27/2021  Is this a "Controlled Substance" medicine? no  Have you seen your PCP in the last 2 weeks? no  If YES, route message to pool  -  If NO, patient needs to be scheduled for appointment.  What is the name of the medication or equipment? Trazadone & Estrace  Have you contacted your pharmacy to request a refill? no   Which pharmacy would you like this sent to? [facility]   Patient notified that their request is being sent to the clinical staff for review and that they should receive a response within 2 business days.   [name] pt.  Trazadone (90 day supply with 3 refills)  Pt has an appt w/Dr G coming up in March for 6 mth f/u already scheduled.

## 2021-11-29 MED ORDER — TRAZODONE HCL 100 MG PO TABS: 100.0000 mg | ORAL_TABLET | Freq: Every day | ORAL | 3 refills | Status: DC

## 2021-11-30 NOTE — Telephone Encounter (Signed)
Pt aware refills sent to pharmacy 

## 2021-12-02 DIAGNOSIS — E1140 Type 2 diabetes mellitus with diabetic neuropathy, unspecified: Secondary | ICD-10-CM | POA: Diagnosis not present

## 2021-12-07 ENCOUNTER — Encounter (INDEPENDENT_AMBULATORY_CARE_PROVIDER_SITE_OTHER): Payer: Medicare Other | Admitting: Ophthalmology

## 2021-12-07 ENCOUNTER — Other Ambulatory Visit: Payer: Self-pay

## 2021-12-07 DIAGNOSIS — E113311 Type 2 diabetes mellitus with moderate nonproliferative diabetic retinopathy with macular edema, right eye: Secondary | ICD-10-CM

## 2021-12-07 DIAGNOSIS — H35033 Hypertensive retinopathy, bilateral: Secondary | ICD-10-CM | POA: Diagnosis not present

## 2021-12-07 DIAGNOSIS — H43813 Vitreous degeneration, bilateral: Secondary | ICD-10-CM

## 2021-12-07 DIAGNOSIS — I10 Essential (primary) hypertension: Secondary | ICD-10-CM

## 2021-12-07 DIAGNOSIS — E113392 Type 2 diabetes mellitus with moderate nonproliferative diabetic retinopathy without macular edema, left eye: Secondary | ICD-10-CM

## 2021-12-28 DIAGNOSIS — M25561 Pain in right knee: Secondary | ICD-10-CM | POA: Diagnosis not present

## 2022-01-01 DIAGNOSIS — E1140 Type 2 diabetes mellitus with diabetic neuropathy, unspecified: Secondary | ICD-10-CM | POA: Diagnosis not present

## 2022-01-05 DIAGNOSIS — M25561 Pain in right knee: Secondary | ICD-10-CM | POA: Diagnosis not present

## 2022-01-11 ENCOUNTER — Other Ambulatory Visit: Payer: Self-pay

## 2022-01-11 ENCOUNTER — Encounter (INDEPENDENT_AMBULATORY_CARE_PROVIDER_SITE_OTHER): Payer: Medicare Other | Admitting: Ophthalmology

## 2022-01-11 DIAGNOSIS — I10 Essential (primary) hypertension: Secondary | ICD-10-CM

## 2022-01-11 DIAGNOSIS — E113311 Type 2 diabetes mellitus with moderate nonproliferative diabetic retinopathy with macular edema, right eye: Secondary | ICD-10-CM

## 2022-01-11 DIAGNOSIS — H35033 Hypertensive retinopathy, bilateral: Secondary | ICD-10-CM

## 2022-01-11 DIAGNOSIS — E113392 Type 2 diabetes mellitus with moderate nonproliferative diabetic retinopathy without macular edema, left eye: Secondary | ICD-10-CM | POA: Diagnosis not present

## 2022-01-11 DIAGNOSIS — H43813 Vitreous degeneration, bilateral: Secondary | ICD-10-CM

## 2022-01-11 DIAGNOSIS — S83241A Other tear of medial meniscus, current injury, right knee, initial encounter: Secondary | ICD-10-CM | POA: Diagnosis not present

## 2022-01-18 ENCOUNTER — Encounter: Payer: Self-pay | Admitting: Family Medicine

## 2022-01-18 ENCOUNTER — Ambulatory Visit (INDEPENDENT_AMBULATORY_CARE_PROVIDER_SITE_OTHER): Payer: Medicare Other | Admitting: Family Medicine

## 2022-01-18 VITALS — BP 111/64 | HR 73 | Temp 98.4°F | Ht 63.0 in | Wt 141.2 lb

## 2022-01-18 DIAGNOSIS — I152 Hypertension secondary to endocrine disorders: Secondary | ICD-10-CM | POA: Diagnosis not present

## 2022-01-18 DIAGNOSIS — E11319 Type 2 diabetes mellitus with unspecified diabetic retinopathy without macular edema: Secondary | ICD-10-CM | POA: Diagnosis not present

## 2022-01-18 DIAGNOSIS — Z794 Long term (current) use of insulin: Secondary | ICD-10-CM | POA: Diagnosis not present

## 2022-01-18 DIAGNOSIS — E119 Type 2 diabetes mellitus without complications: Secondary | ICD-10-CM | POA: Diagnosis not present

## 2022-01-18 DIAGNOSIS — E1142 Type 2 diabetes mellitus with diabetic polyneuropathy: Secondary | ICD-10-CM

## 2022-01-18 DIAGNOSIS — N1831 Chronic kidney disease, stage 3a: Secondary | ICD-10-CM

## 2022-01-18 DIAGNOSIS — E1169 Type 2 diabetes mellitus with other specified complication: Secondary | ICD-10-CM

## 2022-01-18 DIAGNOSIS — E1159 Type 2 diabetes mellitus with other circulatory complications: Secondary | ICD-10-CM

## 2022-01-18 DIAGNOSIS — E785 Hyperlipidemia, unspecified: Secondary | ICD-10-CM | POA: Diagnosis not present

## 2022-01-18 LAB — BAYER DCA HB A1C WAIVED: HB A1C (BAYER DCA - WAIVED): 6.6 % — ABNORMAL HIGH (ref 4.8–5.6)

## 2022-01-18 NOTE — Progress Notes (Signed)
? ?Subjective: ?CC:DM ?PCP: [name], DO ?GLO:VFIEP C Hodgkin is a 81 y.o. female presenting to clinic today for: ? ?1. Type 2 Diabetes with hypertension, hyperlipidemia w/ CKD3a:  ?She reports compliance with all of her medications.  She has been using some Aleve for some right knee pain.  Symptoms were refractory to Tylenol.  No reports of change in urination. ? ?Last eye exam: needs ?Last foot exam: needs ?Last A1c:  ?Lab Results  ?Component Value Date  ? HGBA1C 5.9 (H) 07/21/2021  ? ?Nephropathy screen indicated?: needs ?Last flu, zoster and/or pneumovax:  ?Immunization History  ?Administered Date(s) Administered  ? Fluad Quad(high Dose 65+) 07/09/2019, 09/08/2020  ? Influenza, High Dose Seasonal PF 07/19/2016, 10/13/2017, 08/28/2018  ? Influenza,inj,Quad PF,6+ Mos 07/30/2013, 07/24/2014, 08/04/2015  ? Moderna SARS-COV2 Booster Vaccination 09/13/2020  ? Moderna Sars-Covid-2 Vaccination 11/29/2019, 12/31/2019  ? Pneumococcal Conjugate-13 11/27/2014  ? Pneumococcal Polysaccharide-23 10/22/2009  ? Td 10/25/2004  ? Tdap 11/27/2014  ? Zoster Recombinat (Shingrix) 08/19/2021, 10/30/2021  ? Zoster, Live 04/06/2012  ? ? ?ROS: No chest pain, shortness of breath, edema or falls ? ? ?ROS: Per HPI ? ?Allergies  ?Allergen Reactions  ? Latex Anaphylaxis and Rash  ? Ambien [Zolpidem] Other (See Comments)  ?  Hallucinations  ? Augmentin [Amoxicillin-Pot Clavulanate] Nausea And Vomiting  ? Belsomra [Suvorexant] Other (See Comments)  ?  Hallucinations  ? Hydrocodone-Acetaminophen Other (See Comments)  ?  hallucinations  ? Hyoscyamine Sulfate Other (See Comments)  ? Melatonin Other (See Comments)  ?  Adverse reaction-"keeps me awake"  ? Mobic [Meloxicam]   ? Morphine Nausea And Vomiting  ?  REACTION: hallucinations,GI upset  ? Oxycodone Other (See Comments)  ?  Hallucinations  ? Tramadol   ?  Unknown reaction  ? Celebrex [Celecoxib] Itching and Rash  ? Sulfa Antibiotics Rash  ? ?Past Medical History:  ?Diagnosis Date  ?  Acute cystitis   ? Anxiety   ? Depression   ? Diabetes mellitus type II   ? Diabetic retinopathy   ? DM type 2 causing CKD stage 3 (HCC)   ? Fibromyalgia   ? GERD (gastroesophageal reflux disease)   ? Hyperlipidemia   ? Hypertension   ? IBS (irritable bowel syndrome)   ? IBS (irritable bowel syndrome)   ? Insomnia   ? Lichen planus   ? vulvar  ? Pancreatitis, gallstone   ? Postmenopausal   ? Radial scar of breast 06/04/2016  ? Urge incontinence of urine   ? ? ?Current Outpatient Medications:  ?  acetaminophen (TYLENOL) 325 MG tablet, Take 325 mg by mouth as needed., Disp: , Rfl:  ?  amLODipine (NORVASC) 5 MG tablet, Take 1 tablet (5 mg total) by mouth daily., Disp: 90 tablet, Rfl: 3 ?  aspirin 81 MG tablet, Take 81 mg by mouth every other day., Disp: , Rfl:  ?  atorvastatin (LIPITOR) 10 MG tablet, Take 1 tablet (10 mg total) by mouth at bedtime., Disp: 90 tablet, Rfl: 3 ?  BESIVANCE 0.6 % SUSP, Apply to eye., Disp: , Rfl:  ?  Calcium Carb-Cholecalciferol 600-200 MG-UNIT TABS, Take 1 tablet by mouth 2 (two) times daily., Disp: , Rfl:  ?  cephALEXin (KEFLEX) 500 MG capsule, Take 500 mg by mouth daily. For UTI prevention, Disp: , Rfl:  ?  desonide (DESOWEN) 0.05 % cream, Apply topically 2 (two) times daily., Disp: 30 g, Rfl: 1 ?  diclofenac Sodium (VOLTAREN) 1 % GEL, Apply 4 g topically 4 (four) times daily  as needed (arthritis)., Disp: 400 g, Rfl: 3 ?  Dulaglutide (TRULICITY) 1.5 LA/4.5XM SOPN, Inject 1.5 mg into the skin once a week., Disp: 7.5 mL, Rfl: 3 ?  escitalopram (LEXAPRO) 20 MG tablet, Take 1 tablet (20 mg total) by mouth daily., Disp: 90 tablet, Rfl: 3 ?  esomeprazole (NEXIUM) 20 MG capsule, Take 1 capsule (20 mg total) by mouth daily at 12 noon., Disp: 90 capsule, Rfl: 3 ?  estradiol (ESTRACE) 0.1 MG/GM vaginal cream, Place 2 g vaginally every Monday, Wednesday, and Friday., Disp: 42.5 g, Rfl: 0 ?  fluticasone (FLONASE) 50 MCG/ACT nasal spray, Place 2 sprays into both nostrils daily., Disp: 16 g, Rfl: 6 ?   glucose blood (FREESTYLE LITE) test strip, Use qid to check blood sugar. Dx E11.9, Disp: 400 each, Rfl: 3 ?  insulin aspart (NOVOLOG FLEXPEN) 100 UNIT/ML FlexPen, BG less than 80 - no insulin; 80 to 120 - 3 units; 121 to 150 - 4 units; 151 to 200 - 5 units; 201 to 250 - 6 units; 250 or above - 7 units, Disp: 60 mL, Rfl: PRN ?  insulin glargine (LANTUS SOLOSTAR) 100 UNIT/ML Solostar Pen, Inject 26 Units into the skin every morning., Disp: 45 mL, Rfl: 3 ?  Insulin Pen Needle 31G X 5 MM MISC, 1 Device by Does not apply route 4 (four) times daily., Disp: 300 each, Rfl: 4 ?  Multiple Vitamin (MULTIVITAMIN) tablet, Take 1 tablet by mouth daily., Disp: , Rfl:  ?  nystatin powder, APPLY UNDER BILATERAL BREASTS AND GROIN TWICE A DAY (If needed), Disp: 60 g, Rfl: 1 ?  nystatin-triamcinolone ointment (MYCOLOG), APPLY TOPICALLY 2 (TWO) TIMES DAILY IF NEEDED FOR rash.  NOT A PREVENTATIVE MEDICATION., Disp: 60 g, Rfl: 3 ?  ondansetron (ZOFRAN ODT) 4 MG disintegrating tablet, Take 1 tablet (4 mg total) by mouth every 8 (eight) hours as needed for nausea or vomiting., Disp: 30 tablet, Rfl: 0 ?  Polyethyl Glycol-Propyl Glycol 0.4-0.3 % SOLN, Apply 1 drop to eye daily as needed (for dry eye relief). , Disp: , Rfl:  ?  polyethylene glycol (MIRALAX / GLYCOLAX) packet, Take 17 g by mouth daily as needed. , Disp: , Rfl:  ?  Probiotic Product (PROBIOTIC ADVANCED PO), Take 1 capsule by mouth daily. , Disp: , Rfl:  ?  traZODone (DESYREL) 100 MG tablet, Take 1 tablet (100 mg total) by mouth at bedtime., Disp: 90 tablet, Rfl: 3 ?  Vibegron 75 MG TABS, Take by mouth., Disp: , Rfl:  ?Social History  ? ?Socioeconomic History  ? Marital status: Widowed  ?  Spouse name: Not on file  ? Number of children: 3  ? Years of education: 43  ? Highest education level: Some college, no degree  ?Occupational History  ? Occupation: retired  ?  Employer: RETIRED  ?  Comment: bookeeping  ?Tobacco Use  ? Smoking status: Never  ? Smokeless tobacco: Never  ?Vaping  Use  ? Vaping Use: Never used  ?Substance and Sexual Activity  ? Alcohol use: No  ? Drug use: No  ? Sexual activity: Not Currently  ?Other Topics Concern  ? Not on file  ?Social History Narrative  ? Widowed in 2010; lives alone. Children live out of town  ? ?Social Determinants of Health  ? ?Financial Resource Strain: Not on file  ?Food Insecurity: Not on file  ?Transportation Needs: Not on file  ?Physical Activity: Insufficiently Active  ? Days of Exercise per Week: 7 days  ? Minutes of  Exercise per Session: 20 min  ?Stress: Stress Concern Present  ? Feeling of Stress : To some extent  ?Social Connections: Socially Isolated  ? Frequency of Communication with Friends and Family: More than three times a week  ? Frequency of Social Gatherings with Friends and Family: Once a week  ? Attends Religious Services: Never  ? Active Member of Clubs or Organizations: No  ? Attends Archivist Meetings: Never  ? Marital Status: Widowed  ?Intimate Partner Violence: Not At Risk  ? Fear of Current or Ex-Partner: No  ? Emotionally Abused: No  ? Physically Abused: No  ? Sexually Abused: No  ? ?Family History  ?Problem Relation Age of Onset  ? Diabetes Mother   ? Hypertension Mother   ? Kidney disease Mother   ? Heart disease Mother   ? Peripheral vascular disease Mother   ?     amputation  ? Cancer Father   ?     prostate  ? Diabetes Father   ? Heart disease Father   ?     CABG  ? Hypertension Father   ? Heart attack Father   ? Depression Sister   ? Hypertension Sister   ? Neuropathy Sister   ? ? ?Objective: ?Office vital signs reviewed. ?BP 111/64   Pulse 73   Temp 98.4 ?F (36.9 ?C)   Ht '5\' 3"'$  (1.6 m)   Wt 141 lb 3.2 oz (64 kg)   SpO2 100%   BMI 25.01 kg/m?  ? ?Physical Examination:  ?General: Awake, alert, well nourished, No acute distress ?Cardio: regular rate and rhythm, S1S2 heard, no murmurs appreciated ?Pulm: clear to auscultation bilaterally, no wheezes, rhonchi or rales; normal work of breathing on room  air ?Extremities: warm, well perfused, No edema, cyanosis or clubbing; +2 pulses bilaterally ?MSK: antalgic gait. Using cane ambulation ?Skin: dry; intact; no rashes or lesions ?Neuro: see Neuro exam ? ?Diabeti

## 2022-01-18 NOTE — Patient Instructions (Signed)

## 2022-01-19 LAB — RENAL FUNCTION PANEL
Albumin: 3.8 g/dL (ref 3.6–4.6)
BUN/Creatinine Ratio: 20 (ref 12–28)
BUN: 21 mg/dL (ref 8–27)
CO2: 29 mmol/L (ref 20–29)
Calcium: 8.9 mg/dL (ref 8.7–10.3)
Chloride: 98 mmol/L (ref 96–106)
Creatinine, Ser: 1.03 mg/dL — ABNORMAL HIGH (ref 0.57–1.00)
Glucose: 155 mg/dL — ABNORMAL HIGH (ref 70–99)
Phosphorus: 3.7 mg/dL (ref 3.0–4.3)
Potassium: 4.2 mmol/L (ref 3.5–5.2)
Sodium: 139 mmol/L (ref 134–144)
eGFR: 55 mL/min/{1.73_m2} — ABNORMAL LOW (ref >59)

## 2022-01-19 LAB — CBC
Hematocrit: 39.2 % (ref 34.0–46.6)
Hemoglobin: 13.2 g/dL (ref 11.1–15.9)
MCH: 31.1 pg (ref 26.6–33.0)
MCHC: 33.7 g/dL (ref 31.5–35.7)
MCV: 93 fL (ref 79–97)
Platelets: 150 10*3/uL (ref 150–450)
RBC: 4.24 x10E6/uL (ref 3.77–5.28)
RDW: 12.6 % (ref 11.7–15.4)
WBC: 5.4 10*3/uL (ref 3.4–10.8)

## 2022-01-19 LAB — MICROALBUMIN / CREATININE URINE RATIO
Creatinine, Urine: 181.4 mg/dL
Microalb/Creat Ratio: 8 mg/g creat (ref 0–29)
Microalbumin, Urine: 13.8 ug/mL

## 2022-01-19 LAB — VITAMIN D 25 HYDROXY (VIT D DEFICIENCY, FRACTURES): Vit D, 25-Hydroxy: 67.8 ng/mL (ref 30.0–100.0)

## 2022-01-21 DIAGNOSIS — L84 Corns and callosities: Secondary | ICD-10-CM | POA: Diagnosis not present

## 2022-01-21 DIAGNOSIS — B351 Tinea unguium: Secondary | ICD-10-CM | POA: Diagnosis not present

## 2022-01-21 DIAGNOSIS — E1142 Type 2 diabetes mellitus with diabetic polyneuropathy: Secondary | ICD-10-CM | POA: Diagnosis not present

## 2022-01-21 DIAGNOSIS — M79676 Pain in unspecified toe(s): Secondary | ICD-10-CM | POA: Diagnosis not present

## 2022-01-28 DIAGNOSIS — E1140 Type 2 diabetes mellitus with diabetic neuropathy, unspecified: Secondary | ICD-10-CM | POA: Diagnosis not present

## 2022-02-02 DIAGNOSIS — E1140 Type 2 diabetes mellitus with diabetic neuropathy, unspecified: Secondary | ICD-10-CM | POA: Diagnosis not present

## 2022-02-02 DIAGNOSIS — Z794 Long term (current) use of insulin: Secondary | ICD-10-CM | POA: Diagnosis not present

## 2022-02-11 DIAGNOSIS — Z1231 Encounter for screening mammogram for malignant neoplasm of breast: Secondary | ICD-10-CM | POA: Diagnosis not present

## 2022-02-15 ENCOUNTER — Encounter (INDEPENDENT_AMBULATORY_CARE_PROVIDER_SITE_OTHER): Payer: Medicare Other | Admitting: Ophthalmology

## 2022-02-15 DIAGNOSIS — H35033 Hypertensive retinopathy, bilateral: Secondary | ICD-10-CM | POA: Diagnosis not present

## 2022-02-15 DIAGNOSIS — E113392 Type 2 diabetes mellitus with moderate nonproliferative diabetic retinopathy without macular edema, left eye: Secondary | ICD-10-CM

## 2022-02-15 DIAGNOSIS — I10 Essential (primary) hypertension: Secondary | ICD-10-CM

## 2022-02-15 DIAGNOSIS — E113311 Type 2 diabetes mellitus with moderate nonproliferative diabetic retinopathy with macular edema, right eye: Secondary | ICD-10-CM | POA: Diagnosis not present

## 2022-02-15 DIAGNOSIS — H43813 Vitreous degeneration, bilateral: Secondary | ICD-10-CM

## 2022-02-24 DIAGNOSIS — N6313 Unspecified lump in the right breast, lower outer quadrant: Secondary | ICD-10-CM | POA: Diagnosis not present

## 2022-02-24 DIAGNOSIS — R922 Inconclusive mammogram: Secondary | ICD-10-CM | POA: Diagnosis not present

## 2022-02-27 DIAGNOSIS — E1140 Type 2 diabetes mellitus with diabetic neuropathy, unspecified: Secondary | ICD-10-CM | POA: Diagnosis not present

## 2022-03-02 ENCOUNTER — Other Ambulatory Visit: Payer: Self-pay | Admitting: Family Medicine

## 2022-03-02 DIAGNOSIS — M1990 Unspecified osteoarthritis, unspecified site: Secondary | ICD-10-CM

## 2022-03-02 MED ORDER — DICLOFENAC SODIUM 1 % EX GEL: 4.0000 g | Freq: Four times a day (QID) | CUTANEOUS | 99 refills | Status: DC | PRN

## 2022-03-02 NOTE — Telephone Encounter (Signed)
?  Prescription Request ? ?03/02/2022 ? ?Is this a "Controlled Substance" medicine? no ? ?Have you seen your PCP in the last 2 weeks? no ? ?If YES, route message to pool  -  If NO, patient needs to be scheduled for appointment. ? ?What is the name of the medication or equipment? diclofenac Sodium (VOLTAREN) 1 % GEL ? ?Have you contacted your pharmacy to request a refill? yes  ? ?Which pharmacy would you like this sent to? [facility] - [place], [address] ? ?Wants it sent in for 90 days with 3 refills per pt ? ? ?Patient notified that their request is being sent to the clinical staff for review and that they should receive a response within 2 business days.  ?  ?

## 2022-03-03 NOTE — Telephone Encounter (Signed)
Pt aware refill sent to [facility] ?

## 2022-03-05 DIAGNOSIS — N6313 Unspecified lump in the right breast, lower outer quadrant: Secondary | ICD-10-CM | POA: Diagnosis not present

## 2022-03-05 DIAGNOSIS — Z170 Estrogen receptor positive status [ER+]: Secondary | ICD-10-CM | POA: Diagnosis not present

## 2022-03-05 DIAGNOSIS — Z9889 Other specified postprocedural states: Secondary | ICD-10-CM | POA: Diagnosis not present

## 2022-03-05 DIAGNOSIS — R928 Other abnormal and inconclusive findings on diagnostic imaging of breast: Secondary | ICD-10-CM | POA: Diagnosis not present

## 2022-03-05 DIAGNOSIS — C50511 Malignant neoplasm of lower-outer quadrant of right female breast: Secondary | ICD-10-CM | POA: Diagnosis not present

## 2022-03-12 DIAGNOSIS — C50919 Malignant neoplasm of unspecified site of unspecified female breast: Secondary | ICD-10-CM

## 2022-03-12 HISTORY — DX: Malignant neoplasm of unspecified site of unspecified female breast: C50.919

## 2022-03-15 ENCOUNTER — Ambulatory Visit (INDEPENDENT_AMBULATORY_CARE_PROVIDER_SITE_OTHER): Payer: Medicare Other | Admitting: Family Medicine

## 2022-03-15 ENCOUNTER — Encounter: Payer: Self-pay | Admitting: Family Medicine

## 2022-03-15 VITALS — BP 117/65 | HR 71 | Temp 97.9°F | Ht 63.0 in | Wt 139.6 lb

## 2022-03-15 DIAGNOSIS — M26621 Arthralgia of right temporomandibular joint: Secondary | ICD-10-CM | POA: Diagnosis not present

## 2022-03-15 DIAGNOSIS — L249 Irritant contact dermatitis, unspecified cause: Secondary | ICD-10-CM

## 2022-03-15 MED ORDER — PREDNISONE 10 MG (21) PO TBPK: ORAL_TABLET | ORAL | 0 refills | Status: DC

## 2022-03-15 MED ORDER — TRIAMCINOLONE ACETONIDE 0.1 % EX CREA: 1.0000 "application " | TOPICAL_CREAM | Freq: Two times a day (BID) | CUTANEOUS | 0 refills | Status: DC

## 2022-03-15 NOTE — Progress Notes (Signed)
Subjective: CC: Jaw pain PCP: [name] Norlander, DO [name] is a 81 y.o. female presenting to clinic today for:  1.  Jaw pain Patient reports a couple day history of right-sided jaw pain.  It started abruptly yesterday afternoon and has been intermittent ever since.  She has used Tylenol and an ice pack and that has seemed to help a little bit.  Unable to take oral NSAIDs secondary to CKD 3.  She is not treated with any osteoporosis medications.  She does not recall eating or drinking anything where she was utilizing her jaw more than normal.  She does wear artificial teeth with the bridge on top.  She has an appointment to see her dentist soon for some dental work  2.  Rash Patient reports several day history of rash along her neck.  Left is worse than right.  She reports mild itching.  Denies any new lotions, detergents, soaps, perfumes or hair products.  Has worked some in the yard but that was a few weeks ago and the rashes only been present recently.   ROS: Per HPI  Allergies  Allergen Reactions   Latex Anaphylaxis and Rash   Ambien [Zolpidem] Other (See Comments)    Hallucinations   Augmentin [Amoxicillin-Pot Clavulanate] Nausea And Vomiting   Belsomra [Suvorexant] Other (See Comments)    Hallucinations   Hydrocodone-Acetaminophen Other (See Comments)    hallucinations   Hyoscyamine Sulfate Other (See Comments)   Melatonin Other (See Comments)    Adverse reaction-"keeps me awake"   Mobic [Meloxicam]    Morphine Nausea And Vomiting    REACTION: hallucinations,GI upset   Oxycodone Other (See Comments)    Hallucinations   Tramadol     Unknown reaction   Celebrex [Celecoxib] Itching and Rash   Sulfa Antibiotics Rash   Past Medical History:  Diagnosis Date   Acute cystitis    Anxiety    Depression    Diabetes mellitus type II    Diabetic retinopathy    DM type 2 causing CKD stage 3 (HCC)    Fibromyalgia    GERD (gastroesophageal reflux disease)     Hyperlipidemia    Hypertension    IBS (irritable bowel syndrome)    IBS (irritable bowel syndrome)    Insomnia    Lichen planus    vulvar   Pancreatitis, gallstone    Postmenopausal    Radial scar of breast 06/04/2016   Urge incontinence of urine     Current Outpatient Medications:    acetaminophen (TYLENOL) 325 MG tablet, Take 325 mg by mouth as needed., Disp: , Rfl:    amLODipine (NORVASC) 5 MG tablet, Take 1 tablet (5 mg total) by mouth daily., Disp: 90 tablet, Rfl: 3   aspirin 81 MG tablet, Take 81 mg by mouth every other day., Disp: , Rfl:    atorvastatin (LIPITOR) 10 MG tablet, Take 1 tablet (10 mg total) by mouth at bedtime., Disp: 90 tablet, Rfl: 3   BESIVANCE 0.6 % SUSP, Apply to eye., Disp: , Rfl:    Calcium Carb-Cholecalciferol 600-200 MG-UNIT TABS, Take 1 tablet by mouth 2 (two) times daily., Disp: , Rfl:    desonide (DESOWEN) 0.05 % cream, Apply topically 2 (two) times daily., Disp: 30 g, Rfl: 1   diclofenac Sodium (VOLTAREN) 1 % GEL, Apply 4 g topically 4 (four) times daily as needed (arthritis)., Disp: 400 g, Rfl: prn   Dulaglutide (TRULICITY) 1.5 VW/0.9WJ SOPN, Inject 1.5 mg into the skin once a  week., Disp: 7.5 mL, Rfl: 3   escitalopram (LEXAPRO) 20 MG tablet, Take 1 tablet (20 mg total) by mouth daily., Disp: 90 tablet, Rfl: 3   esomeprazole (NEXIUM) 20 MG capsule, Take 1 capsule (20 mg total) by mouth daily at 12 noon., Disp: 90 capsule, Rfl: 3   estradiol (ESTRACE) 0.1 MG/GM vaginal cream, Place 2 g vaginally every Monday, Wednesday, and Friday., Disp: 42.5 g, Rfl: 0   fluticasone (FLONASE) 50 MCG/ACT nasal spray, Place 2 sprays into both nostrils daily., Disp: 16 g, Rfl: 6   glucose blood (FREESTYLE LITE) test strip, Use qid to check blood sugar. Dx E11.9, Disp: 400 each, Rfl: 3   insulin aspart (NOVOLOG FLEXPEN) 100 UNIT/ML FlexPen, BG less than 80 - no insulin; 80 to 120 - 3 units; 121 to 150 - 4 units; 151 to 200 - 5 units; 201 to 250 - 6 units; 250 or above - 7  units, Disp: 60 mL, Rfl: PRN   insulin glargine (LANTUS SOLOSTAR) 100 UNIT/ML Solostar Pen, Inject 26 Units into the skin every morning., Disp: 45 mL, Rfl: 3   Insulin Pen Needle 31G X 5 MM MISC, 1 Device by Does not apply route 4 (four) times daily., Disp: 300 each, Rfl: 4   Multiple Vitamin (MULTIVITAMIN) tablet, Take 1 tablet by mouth daily., Disp: , Rfl:    nystatin powder, APPLY UNDER BILATERAL BREASTS AND GROIN TWICE A DAY (If needed), Disp: 60 g, Rfl: 1   nystatin-triamcinolone ointment (MYCOLOG), APPLY TOPICALLY 2 (TWO) TIMES DAILY IF NEEDED FOR rash.  NOT A PREVENTATIVE MEDICATION., Disp: 60 g, Rfl: 3   ondansetron (ZOFRAN ODT) 4 MG disintegrating tablet, Take 1 tablet (4 mg total) by mouth every 8 (eight) hours as needed for nausea or vomiting., Disp: 30 tablet, Rfl: 0   Polyethyl Glycol-Propyl Glycol 0.4-0.3 % SOLN, Apply 1 drop to eye daily as needed (for dry eye relief). , Disp: , Rfl:    polyethylene glycol (MIRALAX / GLYCOLAX) packet, Take 17 g by mouth daily as needed. , Disp: , Rfl:    Probiotic Product (PROBIOTIC ADVANCED PO), Take 1 capsule by mouth daily. , Disp: , Rfl:    traZODone (DESYREL) 100 MG tablet, Take 1 tablet (100 mg total) by mouth at bedtime., Disp: 90 tablet, Rfl: 3   Vibegron 75 MG TABS, Take by mouth., Disp: , Rfl:  Social History   Socioeconomic History   Marital status: Widowed    Spouse name: Not on file   Number of children: 3   Years of education: 31   Highest education level: Some college, no degree  Occupational History   Occupation: retired    Fish farm manager: RETIRED    Comment: bookeeping  Tobacco Use   Smoking status: Never   Smokeless tobacco: Never  Vaping Use   Vaping Use: Never used  Substance and Sexual Activity   Alcohol use: No   Drug use: No   Sexual activity: Not Currently  Other Topics Concern   Not on file  Social History Narrative   Widowed in 2010; lives alone. Children live out of town   Social Determinants of Adult nurse Strain: Not on file  Food Insecurity: Not on file  Transportation Needs: Not on file  Physical Activity: Insufficiently Active   Days of Exercise per Week: 7 days   Minutes of Exercise per Session: 20 min  Stress: Stress Concern Present   Feeling of Stress : To some extent  Social Connections: Socially  Isolated   Frequency of Communication with Friends and Family: More than three times a week   Frequency of Social Gatherings with Friends and Family: Once a week   Attends Religious Services: Never   Marine scientist or Organizations: No   Attends Archivist Meetings: Never   Marital Status: Widowed  Human resources officer Violence: Not At Risk   Fear of Current or Ex-Partner: No   Emotionally Abused: No   Physically Abused: No   Sexually Abused: No   Family History  Problem Relation Age of Onset   Diabetes Mother    Hypertension Mother    Kidney disease Mother    Heart disease Mother    Peripheral vascular disease Mother        amputation   Cancer Father        prostate   Diabetes Father    Heart disease Father        CABG   Hypertension Father    Heart attack Father    Depression Sister    Hypertension Sister    Neuropathy Sister     Objective: Office vital signs reviewed. BP 117/65   Pulse 71   Temp 97.9 F (36.6 C)   Ht '5\' 3"'$  (1.6 m)   Wt 139 lb 9.6 oz (63.3 kg)   SpO2 98%   BMI 24.73 kg/m   Physical Examination:  General: Awake, alert, nontoxic female, No acute distress HEENT: Able to open her mouth to finger breaths wide.  She has palpable fullness along the TMJ on the right compared to the left but no palpable clicking or abnormal glide appreciated on exam no tenderness palpation to this area. Skin: Mildly erythematous, maculopapular rash noted along the neck bilaterally with left worse than right  Assessment/ Plan: 81 y.o. female   Arthralgia of right temporomandibular joint - Plan: predniSONE (STERAPRED UNI-PAK 21  TAB) 10 MG (21) TBPK tablet  Irritant contact dermatitis, unspecified trigger - Plan: triamcinolone cream (KENALOG) 0.1 %, predniSONE (STERAPRED UNI-PAK 21 TAB) 10 MG (21) TBPK tablet  Suspect that she has TMJ arthralgia on the right.  We discussed exercises to alleviate some of the pain.  Okay to use heat followed by ice the affected area to reduce pain and swelling.  I have given her a prescription for prednisone given the upcoming holiday weekend and she will utilize this if pain is not getting better with the more conservative treatments.  Appears to have some type of irritant contact dermatitis.  Triamcinolone cream apply to the affected areas as needed for up to 7-10d.  No orders of the defined types were placed in this encounter.  No orders of the defined types were placed in this encounter.    [name] Norlander, DO [facility] [phone]

## 2022-03-15 NOTE — Patient Instructions (Signed)
Jaw Range of Motion Exercises Jaw range of motion exercises are exercises that help your jaw move better. Exercises that help you have good posture (postural exercises) also help relieve jaw discomfort. These are often done along with range of motion exercises. These exercises can help prevent or improve: Trouble opening your mouth. Pain in your jaw while it is open or closed. Temporomandibular joint (TMJ) pain. Headache caused by jaw tension. Take other actions to prevent or relieve jaw pain, such as: Avoiding things that cause or increase jaw pain. This may include: Chewing gum or eating hard foods. Clenching your jaw or teeth, grinding your teeth, or keeping tension in your jaw muscles. Opening your mouth wide, such as for a big yawn. Leaning on your jaw, such as resting your jaw in your hand while leaning on a desk. Putting ice on your jaw. To do this: Put ice in a plastic bag. Place a towel between your skin and the bag. Leave the ice on for 20 minutes, 2-3 times a day. Remove the ice if your skin turns bright red. This is very important. If you cannot feel pain, heat, or cold, you have a greater risk of damage to the area. Only do jaw exercises that your health care provider recommends. Only move your jaw as far as it can comfortably go in each direction. Do not move your jaw into positions that cause pain. Range of motion exercises Repeat each of these exercises 8 times, 1-2 times a day, or as told by your health care provider. Exercise A: Forward protrusion  Push your jaw forward. Hold this position for 1-2 seconds. Let your jaw return to its normal position and rest it there for 1-2 seconds. Exercise B: Controlled opening  Stand or sit in front of a mirror. Place your tongue on the roof of your mouth, just behind your top teeth. Keeping your tongue on the roof of your mouth, slowly open and close your mouth. While you open and close your mouth, watch your jaw in the mirror. Try  to keep your jaw from moving to one side or the other. Exercise C: Right and left motion  Move your jaw right. Hold this position for 1-2 seconds. Let your jaw return to its normal position, and rest it there for 1-2 seconds. Move your jaw left. Hold this position for 1-2 seconds. Let your jaw return to its normal position, and rest it there for 1-2 seconds. Postural exercises Exercise A: Chin tucks  You can do this exercise sitting, standing, or lying down. Move your head straight back, keeping your head level. You can guide the movement by placing your fingers on your chin to push your jaw back in an even motion. You should be able to feel a double chin form at the end of the motion. Hold this position for 5 seconds. Repeat 10-15 times. Exercise B: Shoulder blade squeeze  Sit or stand. Bend your elbows to about 90 degrees, which is the shape of a capital letter "L." Keep your upper arms by your body. Squeeze your shoulder blades down and back, as though you were trying to touch your elbows behind you. Do not shrug your shoulders or move your head. Hold this position for 5 seconds. Repeat 10-15 times. Exercise C: Chest stretch  Stand in a doorway with one of your feet slightly in front of the other. This is called a staggered stance. If you cannot reach your forearms to the door frame, do this exercise in   a corner of a room. Put both of your hands and your forearms on the door frame, with your arms wide apart. Make sure your arms are at a 90-degree angle to your body. Place your hands on the door frame at the height of your elbows. Slowly move your weight onto your front foot until you feel a stretch across your chest and in the front of your shoulders. Keep your head and chest upright and keep your abdominal muscles tight. Do not lean in. Hold this position for 30 seconds. Repeat 3 times. Contact a health care provider if: You have jaw pain that is new or gets worse. You have clicking or  popping sounds while doing the exercises. Get help right away if: Your jaw is stuck in one place and you cannot move it. You cannot open or close your mouth. Summary Jaw range of motion exercises are exercises that help your jaw move better. Take actions to prevent or relieve jaw pain: limit chewing gum or eating hard foods; clenching your jaw or teeth; or leaning on your jaw, such as resting your jaw in your hand while leaning on a desk. Repeat each of the jaw range of motion exercises 8 times, 1-2 times a day, or as told by your health care provider. Contact a health care provider if you have clicking or popping sounds while doing the exercises. This information is not intended to replace advice given to you by your health care provider. Make sure you discuss any questions you have with your health care provider. Document Revised: 05/24/2021 Document Reviewed: 05/24/2021 [employer] Patient Education  2023 [employer]  

## 2022-03-16 DIAGNOSIS — C50511 Malignant neoplasm of lower-outer quadrant of right female breast: Secondary | ICD-10-CM | POA: Insufficient documentation

## 2022-03-17 DIAGNOSIS — C50511 Malignant neoplasm of lower-outer quadrant of right female breast: Secondary | ICD-10-CM | POA: Diagnosis not present

## 2022-03-17 DIAGNOSIS — Z9189 Other specified personal risk factors, not elsewhere classified: Secondary | ICD-10-CM | POA: Diagnosis not present

## 2022-03-17 DIAGNOSIS — Z170 Estrogen receptor positive status [ER+]: Secondary | ICD-10-CM | POA: Diagnosis not present

## 2022-03-17 DIAGNOSIS — L439 Lichen planus, unspecified: Secondary | ICD-10-CM | POA: Diagnosis not present

## 2022-03-17 DIAGNOSIS — Z780 Asymptomatic menopausal state: Secondary | ICD-10-CM | POA: Diagnosis not present

## 2022-03-24 DIAGNOSIS — E785 Hyperlipidemia, unspecified: Secondary | ICD-10-CM | POA: Diagnosis not present

## 2022-03-24 DIAGNOSIS — Z79899 Other long term (current) drug therapy: Secondary | ICD-10-CM | POA: Diagnosis not present

## 2022-03-24 DIAGNOSIS — Z91040 Latex allergy status: Secondary | ICD-10-CM | POA: Diagnosis not present

## 2022-03-24 DIAGNOSIS — Z888 Allergy status to other drugs, medicaments and biological substances status: Secondary | ICD-10-CM | POA: Diagnosis not present

## 2022-03-24 DIAGNOSIS — Z87892 Personal history of anaphylaxis: Secondary | ICD-10-CM | POA: Diagnosis not present

## 2022-03-24 DIAGNOSIS — Z9049 Acquired absence of other specified parts of digestive tract: Secondary | ICD-10-CM | POA: Diagnosis not present

## 2022-03-24 DIAGNOSIS — N3281 Overactive bladder: Secondary | ICD-10-CM | POA: Diagnosis not present

## 2022-03-24 DIAGNOSIS — C50511 Malignant neoplasm of lower-outer quadrant of right female breast: Secondary | ICD-10-CM | POA: Diagnosis not present

## 2022-03-24 DIAGNOSIS — Z882 Allergy status to sulfonamides status: Secondary | ICD-10-CM | POA: Diagnosis not present

## 2022-03-24 DIAGNOSIS — Z794 Long term (current) use of insulin: Secondary | ICD-10-CM | POA: Diagnosis not present

## 2022-03-24 DIAGNOSIS — F32A Depression, unspecified: Secondary | ICD-10-CM | POA: Diagnosis not present

## 2022-03-24 DIAGNOSIS — M797 Fibromyalgia: Secondary | ICD-10-CM | POA: Diagnosis not present

## 2022-03-24 DIAGNOSIS — N641 Fat necrosis of breast: Secondary | ICD-10-CM | POA: Diagnosis not present

## 2022-03-24 DIAGNOSIS — Z885 Allergy status to narcotic agent status: Secondary | ICD-10-CM | POA: Diagnosis not present

## 2022-03-24 DIAGNOSIS — I10 Essential (primary) hypertension: Secondary | ICD-10-CM | POA: Diagnosis not present

## 2022-03-24 DIAGNOSIS — Z7982 Long term (current) use of aspirin: Secondary | ICD-10-CM | POA: Diagnosis not present

## 2022-03-24 DIAGNOSIS — E119 Type 2 diabetes mellitus without complications: Secondary | ICD-10-CM | POA: Diagnosis not present

## 2022-03-24 DIAGNOSIS — Z170 Estrogen receptor positive status [ER+]: Secondary | ICD-10-CM | POA: Diagnosis not present

## 2022-03-24 DIAGNOSIS — C50911 Malignant neoplasm of unspecified site of right female breast: Secondary | ICD-10-CM | POA: Diagnosis not present

## 2022-03-24 DIAGNOSIS — K589 Irritable bowel syndrome without diarrhea: Secondary | ICD-10-CM | POA: Diagnosis not present

## 2022-03-24 DIAGNOSIS — R921 Mammographic calcification found on diagnostic imaging of breast: Secondary | ICD-10-CM | POA: Diagnosis not present

## 2022-03-29 ENCOUNTER — Encounter (INDEPENDENT_AMBULATORY_CARE_PROVIDER_SITE_OTHER): Payer: Medicare Other | Admitting: Ophthalmology

## 2022-03-29 DIAGNOSIS — E1140 Type 2 diabetes mellitus with diabetic neuropathy, unspecified: Secondary | ICD-10-CM | POA: Diagnosis not present

## 2022-03-29 DIAGNOSIS — I10 Essential (primary) hypertension: Secondary | ICD-10-CM

## 2022-03-29 DIAGNOSIS — E113392 Type 2 diabetes mellitus with moderate nonproliferative diabetic retinopathy without macular edema, left eye: Secondary | ICD-10-CM

## 2022-03-29 DIAGNOSIS — H35033 Hypertensive retinopathy, bilateral: Secondary | ICD-10-CM | POA: Diagnosis not present

## 2022-03-29 DIAGNOSIS — H43813 Vitreous degeneration, bilateral: Secondary | ICD-10-CM

## 2022-03-29 DIAGNOSIS — E113311 Type 2 diabetes mellitus with moderate nonproliferative diabetic retinopathy with macular edema, right eye: Secondary | ICD-10-CM

## 2022-04-14 ENCOUNTER — Ambulatory Visit (INDEPENDENT_AMBULATORY_CARE_PROVIDER_SITE_OTHER): Payer: Medicare Other

## 2022-04-14 VITALS — Wt 137.0 lb

## 2022-04-14 DIAGNOSIS — Z0000 Encounter for general adult medical examination without abnormal findings: Secondary | ICD-10-CM | POA: Diagnosis not present

## 2022-04-14 NOTE — Progress Notes (Signed)
Subjective:   [name] is a 81 y.o. female who presents for Medicare Annual (Subsequent) preventive examination.  Virtual Visit via Telephone Note  I connected with  [name] on 04/14/22 at  2:00 PM EDT by telephone and verified that I am speaking with the correct person using two identifiers.  Location: Patient: Home Provider: [facility] Persons participating in the virtual visit: patient/Nurse Health Advisor   I discussed the limitations, risks, security and privacy concerns of performing an evaluation and management service by telephone and the availability of in person appointments. The patient expressed understanding and agreed to proceed.  Interactive audio and video telecommunications were attempted between this nurse and patient, however failed, due to patient having technical difficulties OR patient did not have access to video capability.  We continued and completed visit with audio only.  Some vital signs may be absent or patient reported.   Renalda Locklin E Athony Coppa, LPN   Review of Systems     Cardiac Risk Factors include: advanced age (>6mn, >>57women);diabetes mellitus;dyslipidemia;hypertension;sedentary lifestyle     Objective:    Today's Vitals   04/14/22 1402  Weight: 137 lb (62.1 kg)   Body mass index is 24.27 kg/m.     04/14/2022    2:10 PM 04/13/2021    2:28 PM 04/10/2020    1:46 PM 08/03/2019   11:29 AM 03/28/2019    1:37 PM 08/02/2018    2:30 PM 03/02/2018    3:47 PM  Advanced Directives  Does Patient Have a Medical Advance Directive? Yes Yes Yes Yes Yes Yes Yes  Type of [place]of [place] will [place] will Living will;Out of facility DNR (pink MOST or yellow form);Healthcare Power of [place] will [place] will [place] will Living will;Healthcare Power of Attorney  Does patient want to make changes to  medical advance directive?   No - Patient declined No - Patient declined Yes (MAU/Ambulatory/Procedural Areas - Information given) No - Patient declined No - Patient declined  Copy of [place] Chart? No - copy requested No - copy requested No - copy requested No - copy requested No - copy requested No - copy requested No - copy requested  Would patient like information on creating a medical advance directive?       No - Patient declined    Current Medications (verified) Outpatient Encounter Medications as of 04/14/2022  Medication Sig   acetaminophen (TYLENOL) 325 MG tablet Take 325 mg by mouth as needed.   amLODipine (NORVASC) 5 MG tablet Take 1 tablet (5 mg total) by mouth daily.   aspirin 81 MG tablet Take 81 mg by mouth every other day.   atorvastatin (LIPITOR) 10 MG tablet Take 1 tablet (10 mg total) by mouth at bedtime.   BESIVANCE 0.6 % SUSP Apply to eye.   Calcium Carb-Cholecalciferol 600-200 MG-UNIT TABS Take 1 tablet by mouth 2 (two) times daily.   desonide (DESOWEN) 0.05 % cream Apply topically 2 (two) times daily.   diclofenac Sodium (VOLTAREN) 1 % GEL Apply 4 g topically 4 (four) times daily as needed (arthritis).   Dulaglutide (TRULICITY) 1.5 MNL/8.9QJSOPN Inject 1.5 mg into the skin once a week.   escitalopram (LEXAPRO) 20 MG tablet Take 1 tablet (20 mg total) by mouth daily.   esomeprazole (NEXIUM) 20 MG capsule Take 1 capsule (20 mg total) by mouth daily at 12 noon.  fluticasone (FLONASE) 50 MCG/ACT nasal spray Place 2 sprays into both nostrils daily.   glucose blood (FREESTYLE LITE) test strip Use qid to check blood sugar. Dx E11.9   insulin aspart (NOVOLOG FLEXPEN) 100 UNIT/ML FlexPen BG less than 80 - no insulin; 80 to 120 - 3 units; 121 to 150 - 4 units; 151 to 200 - 5 units; 201 to 250 - 6 units; 250 or above - 7 units   insulin glargine (LANTUS SOLOSTAR) 100 UNIT/ML Solostar Pen Inject 26 Units into the skin every morning.   insulin glargine  (LANTUS) 100 UNIT/ML Solostar Pen Inject into the skin.   Insulin Pen Needle 31G X 5 MM MISC 1 Device by Does not apply route 4 (four) times daily.   Multiple Vitamin (MULTIVITAMIN) tablet Take 1 tablet by mouth daily.   nystatin powder APPLY UNDER BILATERAL BREASTS AND GROIN TWICE A DAY (If needed)   nystatin-triamcinolone ointment (MYCOLOG) APPLY TOPICALLY 2 (TWO) TIMES DAILY IF NEEDED FOR rash.  NOT A PREVENTATIVE MEDICATION.   Polyethyl Glycol-Propyl Glycol 0.4-0.3 % SOLN Apply 1 drop to eye daily as needed (for dry eye relief).    polyethylene glycol (MIRALAX / GLYCOLAX) packet Take 17 g by mouth daily as needed.    Probiotic Product (PROBIOTIC ADVANCED PO) Take 1 capsule by mouth daily.    traZODone (DESYREL) 100 MG tablet Take 1 tablet (100 mg total) by mouth at bedtime.   triamcinolone cream (KENALOG) 0.1 % Apply 1 application. topically 2 (two) times daily. X7-14 days for rash.   Vibegron 75 MG TABS Take by mouth.   [DISCONTINUED] estradiol (ESTRACE) 0.1 MG/GM vaginal cream Place 2 g vaginally every Monday, Wednesday, and Friday.   [DISCONTINUED] ondansetron (ZOFRAN ODT) 4 MG disintegrating tablet Take 1 tablet (4 mg total) by mouth every 8 (eight) hours as needed for nausea or vomiting.   [DISCONTINUED] predniSONE (STERAPRED UNI-PAK 21 TAB) 10 MG (21) TBPK tablet Take as directed on pack   No facility-administered encounter medications on file as of 04/14/2022.    Allergies (verified) Latex, Ambien [zolpidem], Augmentin [amoxicillin-pot clavulanate], Belsomra [suvorexant], Hydrocodone-acetaminophen, Hyoscyamine sulfate, Melatonin, Mobic [meloxicam], Morphine, Oxycodone, Tramadol, Celebrex [celecoxib], and Sulfa antibiotics   History: Past Medical History:  Diagnosis Date   Acute cystitis    Anxiety    Breast cancer detected by national screening programme ([place]) 03/12/2022   Depression    Diabetes mellitus type II    Diabetic retinopathy    DM type 2 causing CKD stage 3 ([place])     Fibromyalgia    GERD (gastroesophageal reflux disease)    Hyperlipidemia    Hypertension    IBS (irritable bowel syndrome)    IBS (irritable bowel syndrome)    Insomnia    Lichen planus    vulvar   Pancreatitis, gallstone    Postmenopausal    Radial scar of breast 06/04/2016   Urge incontinence of urine    Past Surgical History:  Procedure Laterality Date   ANKLE SURGERY  02/2004   BREAST LUMPECTOMY WITH RADIOACTIVE SEED LOCALIZATION Left 01/22/2016   Procedure: LEFT BREAST LUMPECTOMY WITH RADIOACTIVE SEED LOCALIZATION;  Surgeon: Autumn Messing III, MD;  Location: [facility];  Service: General;  Laterality: Left;   CATARACT EXTRACTION W/ INTRAOCULAR LENS IMPLANT  09/2003, 12/2003   CHOLECYSTECTOMY  2004   EYE SURGERY     laser treatments   JOINT REPLACEMENT     TOTAL KNEE ARTHROPLASTY     Left   TRIGGER FINGER RELEASE Left  01/30/2002   TUBAL LIGATION  1970   Family History  Problem Relation Age of Onset   Diabetes Mother    Hypertension Mother    Kidney disease Mother    Heart disease Mother    Peripheral vascular disease Mother        amputation   Cancer Father        prostate   Diabetes Father    Heart disease Father        CABG   Hypertension Father    Heart attack Father    Depression Sister    Hypertension Sister    Neuropathy Sister    Social History   Socioeconomic History   Marital status: Widowed    Spouse name: Not on file   Number of children: 3   Years of education: 62   Highest education level: Some college, no degree  Occupational History   Occupation: retired    Fish farm manager: RETIRED    Comment: bookeeping  Tobacco Use   Smoking status: Never   Smokeless tobacco: Never  Vaping Use   Vaping Use: Never used  Substance and Sexual Activity   Alcohol use: No   Drug use: No   Sexual activity: Not Currently  Other Topics Concern   Not on file  Social History Narrative   Widowed in 2010; lives alone. Children live out of town    Social Determinants of Health   Financial Resource Strain: Low Risk  (04/14/2022)   Overall Financial Resource Strain (CARDIA)    Difficulty of Paying Living Expenses: Not hard at all  Food Insecurity: No Food Insecurity (04/14/2022)   Hunger Vital Sign    Worried About Running Out of Food in the Last Year: Never true    Ran Out of Food in the Last Year: Never true  Transportation Needs: No Transportation Needs (04/14/2022)   PRAPARE - Hydrologist (Medical): No    Lack of Transportation (Non-Medical): No  Physical Activity: Insufficiently Active (04/14/2022)   Exercise Vital Sign    Days of Exercise per Week: 7 days    Minutes of Exercise per Session: 20 min  Stress: No Stress Concern Present (04/14/2022)   [place]    Feeling of Stress : Not at all  Social Connections: Socially Isolated (04/14/2022)   Social Connection and Isolation Panel [NHANES]    Frequency of Communication with Friends and Family: More than three times a week    Frequency of Social Gatherings with Friends and Family: Three times a week    Attends Religious Services: Never    Active Member of Clubs or Organizations: No    Attends Archivist Meetings: Never    Marital Status: Widowed    Tobacco Counseling Counseling given: Not Answered   Clinical Intake:  Pre-visit preparation completed: Yes  Pain : No/denies pain     BMI - recorded: 24.27 Nutritional Status: BMI of 19-24  Normal Nutritional Risks: Nausea/ vomitting/ diarrhea (IBS - bad diarrhea today) Diabetes: Yes CBG done?: No Did pt. bring in CBG monitor from home?: No  How often do you need to have someone help you when you read instructions, pamphlets, or other written materials from your doctor or pharmacy?: 1 - Never  Diabetic? Nutrition Risk Assessment:  Has the patient had any N/V/D within the last 2 months?  Yes  Does the patient  have any non-healing wounds?  No  Has the patient had  any unintentional weight loss or weight gain?  No   Diabetes:  Is the patient diabetic?  Yes  If diabetic, was a CBG obtained today?  No  Did the patient bring in their glucometer from home?  No  How often do you monitor your CBG's? FreeStyle Libre - several times per day - this am fasting - 139 per patient.   Financial Strains and Diabetes Management:  Are you having any financial strains with the device, your supplies or your medication? No .  Does the patient want to be seen by Chronic Care Management for management of their diabetes?  No  Would the patient like to be referred to a Nutritionist or for Diabetic Management?  No   Diabetic Exams:  Diabetic Eye Exam: Completed 12/2021.   Diabetic Foot Exam: Completed 01/18/2022. Pt has been advised about the importance in completing this exam.  Interpreter Needed?: No  Information entered by :: Monna Crean, LPN   Activities of Daily Living    04/14/2022    2:10 PM  In your present state of health, do you have any difficulty performing the following activities:  Hearing? 0  Vision? 0  Difficulty concentrating or making decisions? 0  Walking or climbing stairs? 0  Dressing or bathing? 0  Doing errands, shopping? 0  Preparing Food and eating ? N  Using the Toilet? N  In the past six months, have you accidently leaked urine? Y  Comment improved some since starting [facility], but still having a lot of trouble - seeing Urology  Do you have problems with loss of bowel control? Y  Comment IBS - mild intermittent  Managing your Medications? N  Managing your Finances? N  Housekeeping or managing your Housekeeping? N    Patient Care Team: [name], DO as PCP - General (Family Medicine) Paula Compton, MD as Consulting Physician (Obstetrics and Gynecology) Bjorn Loser, MD as Consulting Physician (Urology) Minus Breeding, MD as Consulting Physician  (Cardiology) Steffanie Rainwater, DPM as Consulting Physician (Podiatry) Hayden Pedro, MD as Consulting Physician (Ophthalmology) Harlen Labs, MD as Referring Physician (Optometry) Susette Racer, MD (Endocrinology)  Indicate any recent Medical Services you may have received from other than [facility] providers in the past year (date may be approximate).     Assessment:   This is a routine wellness examination for [place].  Hearing/Vision screen Hearing Screening - Comments:: Denies hearing difficulties   Vision Screening - Comments:: Wears rx glasses - up to date with routine eye exams with [facility] in [place] - also seeing retina specialist - Dr Zigmund Daniel in [place]  Dietary issues and exercise activities discussed: Current Exercise Habits: Home exercise routine, Type of exercise: walking, Time (Minutes): 20, Frequency (Times/Week): 7, Weekly Exercise (Minutes/Week): 140, Intensity: Mild, Exercise limited by: orthopedic condition(s);Other - see comments (neuropathy)   Goals Addressed             This Visit's Progress    Exercise 3x per week (30 min per time)   Not on track    Prevent falls   On track      Depression Screen    04/14/2022    2:09 PM 03/15/2022    3:00 PM 01/18/2022    2:28 PM 07/21/2021    4:07 PM 04/14/2021   12:28 PM 04/13/2021    2:26 PM 03/17/2021    3:24 PM  PHQ 2/9 Scores  PHQ - 2 Score '2 2 2 '$ 0 '1 2 2  '$ PHQ- 9  Score '3 3 2  2 4 3    '$ Fall Risk    03/15/2022    2:59 PM 01/18/2022    2:28 PM 07/21/2021    4:06 PM 04/14/2021   12:28 PM 04/13/2021    2:35 PM  Fall Risk   Falls in the past year? 0 0 0 0 0  Number falls in past yr:     0  Injury with Fall?     0  Risk for fall due to :     Orthopedic patient;Impaired vision  Follow up     Falls prevention discussed    FALL RISK PREVENTION PERTAINING TO THE HOME:  Any stairs in or around the home? Yes  If so, are there any without handrails? No  Home free of loose throw rugs in walkways, pet beds,  electrical cords, etc? Yes  Adequate lighting in your home to reduce risk of falls? Yes   ASSISTIVE DEVICES UTILIZED TO PREVENT FALLS:  Life alert? Yes  Use of a cane, walker or w/c? No  Grab bars in the bathroom? Yes  Shower chair or bench in shower? Yes  Elevated toilet seat or a handicapped toilet? Yes   TIMED UP AND GO:  Was the test performed? No . Telephonic visit  Cognitive Function:    03/02/2018    3:53 PM 01/19/2017    2:45 PM 01/16/2016    2:55 PM 12/30/2014   12:16 PM  MMSE - Mini Mental State Exam  Orientation to time '5 5 5 5  '$ Orientation to Place '5 5 5 5  '$ Registration '3 3 3 3  '$ Attention/ Calculation '5 5 5 5  '$ Recall '1 2 3 3  '$ Language- name 2 objects '2 2 2 2  '$ Language- repeat '1 1 1 1  '$ Language- follow 3 step command '3 3 3 3  '$ Language- read & follow direction '1 1 1 1  '$ Write a sentence '1 1 1 1  '$ Copy design '1 1 1 1  '$ Total score '28 29 30 30        '$ 04/14/2022    2:12 PM 04/10/2020    1:51 PM 03/28/2019    1:39 PM  6CIT Screen  What Year? 0 points 0 points 0 points  What month? 0 points 0 points 0 points  What time? 0 points 0 points 0 points  Count back from 20 0 points 0 points 0 points  Months in reverse 0 points 0 points 0 points  Repeat phrase 2 points 0 points 0 points  Total Score 2 points 0 points 0 points    Immunizations Immunization History  Administered Date(s) Administered   Fluad Quad(high Dose 65+) 07/09/2019, 09/08/2020   Influenza, High Dose Seasonal PF 07/19/2016, 10/13/2017, 08/28/2018   Influenza,inj,Quad PF,6+ Mos 07/30/2013, 07/24/2014, 08/04/2015   Moderna SARS-COV2 Booster Vaccination 09/13/2020   Moderna Sars-Covid-2 Vaccination 11/29/2019, 12/31/2019   Pneumococcal Conjugate-13 11/27/2014   Pneumococcal Polysaccharide-23 10/22/2009   Td 10/25/2004   Tdap 11/27/2014   Zoster Recombinat (Shingrix) 08/19/2021, 10/30/2021   Zoster, Live 04/06/2012    TDAP status: Up to date  Flu Vaccine status: Up to date  Pneumococcal vaccine  status: Up to date  Covid-19 vaccine status: Completed vaccines  Qualifies for Shingles Vaccine? Yes   Zostavax completed Yes   Shingrix Completed?: Yes  Screening Tests Health Maintenance  Topic Date Due   COVID-19 Vaccine (3 - Moderna risk series) 10/11/2020   OPHTHALMOLOGY EXAM  01/12/2022   INFLUENZA VACCINE  05/25/2022  HEMOGLOBIN A1C  07/21/2022   FOOT EXAM  01/19/2023   URINE MICROALBUMIN  01/19/2023   TETANUS/TDAP  11/27/2024   Pneumonia Vaccine 43+ Years old  Completed   DEXA SCAN  Completed   Zoster Vaccines- Shingrix  Completed   HPV VACCINES  Aged Out    Health Maintenance  Health Maintenance Due  Topic Date Due   COVID-19 Vaccine (3 - Moderna risk series) 10/11/2020   OPHTHALMOLOGY EXAM  01/12/2022    Colorectal cancer screening: No longer required.   Mammogram status: Completed 03/24/2022. Repeat every year  Bone Density status: Completed 05/29/2018. Results reflect: Bone density results: NORMAL. Repeat every 5 years.  Lung Cancer Screening: (Low Dose CT Chest recommended if Age 54-80 years, 30 pack-year currently smoking OR have quit w/in 15years.) does not qualify.  Additional Screening:  Hepatitis C Screening: does not qualify  Vision Screening: Recommended annual ophthalmology exams for early detection of glaucoma and other disorders of the eye. Is the patient up to date with their annual eye exam?  Yes  Who is the provider or what is the name of the office in which the patient attends annual eye exams? [facility] If pt is not established with a provider, would they like to be referred to a provider to establish care? No .   Dental Screening: Recommended annual dental exams for proper oral hygiene  Community Resource Referral / Chronic Care Management: CRR required this visit?  No   CCM required this visit?  No      Plan:     I have personally reviewed and noted the following in the patient's chart:   Medical and social  history Use of alcohol, tobacco or illicit drugs  Current medications and supplements including opioid prescriptions.  Functional ability and status Nutritional status Physical activity Advanced directives List of other physicians Hospitalizations, surgeries, and ER visits in previous 12 months Vitals Screenings to include cognitive, depression, and falls Referrals and appointments  In addition, I have reviewed and discussed with patient certain preventive protocols, quality metrics, and best practice recommendations. A written personalized care plan for preventive services as well as general preventive health recommendations were provided to patient.     Sandrea Hammond, LPN   [id number]   Nurse Notes: She has diarrhea really bad today - likely just IBS flare up - advised to stay hydrated, monitor sugar closely - call if no better

## 2022-04-14 NOTE — Patient Instructions (Signed)
[name] , Thank you for taking time to come for your Medicare Wellness Visit. I appreciate your ongoing commitment to your health goals. Please review the following plan we discussed and let me know if I can assist you in the future.   Screening recommendations/referrals: Colonoscopy: No longer required Mammogram: Done 03/17/2022 - Repeat annually  Bone Density: Done 05/29/2018 - Repeat in 5 years  Recommended yearly ophthalmology/optometry visit for glaucoma screening and checkup Recommended yearly dental visit for hygiene and checkup  Vaccinations: Influenza vaccine: Done 2022 - Repeat annually in fall Pneumococcal vaccine: Done  11/27/2014 & 10/22/2009  Tdap vaccine: Done 11/27/2014 - Repeat in 10 years  Shingles vaccine: Done 08/19/2021 & 10/30/2021      Covid-19: Done  11/29/2019, 12/31/2019, & 09/13/2020  Advanced directives: Please bring a copy of your health care power of attorney and living will to the office to be added to your chart at your convenience.   Conditions/risks identified: Aim for 30 minutes of exercise or brisk walking, 6-8 glasses of water, and 5 servings of fruits and vegetables each day.   Next appointment: Follow up in one year for your annual wellness visit    Preventive Care 65 Years and Older, Female Preventive care refers to lifestyle choices and visits with your health care provider that can promote health and wellness. What does preventive care include? A yearly physical exam. This is also called an annual well check. Dental exams once or twice a year. Routine eye exams. Ask your health care provider how often you should have your eyes checked. Personal lifestyle choices, including: Daily care of your teeth and gums. Regular physical activity. Eating a healthy diet. Avoiding tobacco and drug use. Limiting alcohol use. Practicing safe sex. Taking low-dose aspirin every day. Taking vitamin and mineral supplements as recommended by your health care  provider. What happens during an annual well check? The services and screenings done by your health care provider during your annual well check will depend on your age, overall health, lifestyle risk factors, and family history of disease. Counseling  Your health care provider may ask you questions about your: Alcohol use. Tobacco use. Drug use. Emotional well-being. Home and relationship well-being. Sexual activity. Eating habits. History of falls. Memory and ability to understand (cognition). Work and work Statistician. Reproductive health. Screening  You may have the following tests or measurements: Height, weight, and BMI. Blood pressure. Lipid and cholesterol levels. These may be checked every 5 years, or more frequently if you are over 3 years old. Skin check. Lung cancer screening. You may have this screening every year starting at age 24 if you have a 30-pack-year history of smoking and currently smoke or have quit within the past 15 years. Fecal occult blood test (FOBT) of the stool. You may have this test every year starting at age 55. Flexible sigmoidoscopy or colonoscopy. You may have a sigmoidoscopy every 5 years or a colonoscopy every 10 years starting at age 82. Hepatitis C blood test. Hepatitis B blood test. Sexually transmitted disease (STD) testing. Diabetes screening. This is done by checking your blood sugar (glucose) after you have not eaten for a while (fasting). You may have this done every 1-3 years. Bone density scan. This is done to screen for osteoporosis. You may have this done starting at age 75. Mammogram. This may be done every 1-2 years. Talk to your health care provider about how often you should have regular mammograms. Talk with your health care provider about your  test results, treatment options, and if necessary, the need for more tests. Vaccines  Your health care provider may recommend certain vaccines, such as: Influenza vaccine. This is  recommended every year. Tetanus, diphtheria, and acellular pertussis (Tdap, Td) vaccine. You may need a Td booster every 10 years. Zoster vaccine. You may need this after age 21. Pneumococcal 13-valent conjugate (PCV13) vaccine. One dose is recommended after age 74. Pneumococcal polysaccharide (PPSV23) vaccine. One dose is recommended after age 38. Talk to your health care provider about which screenings and vaccines you need and how often you need them. This information is not intended to replace advice given to you by your health care provider. Make sure you discuss any questions you have with your health care provider. Document Released: 11/07/2015 Document Revised: 06/30/2016 Document Reviewed: 08/12/2015 Elsevier Interactive Patient Education  2017 [place] Prevention in the Home Falls can cause injuries. They can happen to people of all ages. There are many things you can do to make your home safe and to help prevent falls. What can I do on the outside of my home? Regularly fix the edges of walkways and driveways and fix any cracks. Remove anything that might make you trip as you walk through a door, such as a raised step or threshold. Trim any bushes or trees on the path to your home. Use bright outdoor lighting. Clear any walking paths of anything that might make someone trip, such as rocks or tools. Regularly check to see if handrails are loose or broken. Make sure that both sides of any steps have handrails. Any raised decks and porches should have guardrails on the edges. Have any leaves, snow, or ice cleared regularly. Use sand or salt on walking paths during winter. Clean up any spills in your garage right away. This includes oil or grease spills. What can I do in the bathroom? Use night lights. Install grab bars by the toilet and in the tub and shower. Do not use towel bars as grab bars. Use non-skid mats or decals in the tub or shower. If you need to sit down in  the shower, use a plastic, non-slip stool. Keep the floor dry. Clean up any water that spills on the floor as soon as it happens. Remove soap buildup in the tub or shower regularly. Attach bath mats securely with double-sided non-slip rug tape. Do not have throw rugs and other things on the floor that can make you trip. What can I do in the bedroom? Use night lights. Make sure that you have a light by your bed that is easy to reach. Do not use any sheets or blankets that are too big for your bed. They should not hang down onto the floor. Have a firm chair that has side arms. You can use this for support while you get dressed. Do not have throw rugs and other things on the floor that can make you trip. What can I do in the kitchen? Clean up any spills right away. Avoid walking on wet floors. Keep items that you use a lot in easy-to-reach places. If you need to reach something above you, use a strong step stool that has a grab bar. Keep electrical cords out of the way. Do not use floor polish or wax that makes floors slippery. If you must use wax, use non-skid floor wax. Do not have throw rugs and other things on the floor that can make you trip. What can I do with my  stairs? Do not leave any items on the stairs. Make sure that there are handrails on both sides of the stairs and use them. Fix handrails that are broken or loose. Make sure that handrails are as long as the stairways. Check any carpeting to make sure that it is firmly attached to the stairs. Fix any carpet that is loose or worn. Avoid having throw rugs at the top or bottom of the stairs. If you do have throw rugs, attach them to the floor with carpet tape. Make sure that you have a light switch at the top of the stairs and the bottom of the stairs. If you do not have them, ask someone to add them for you. What else can I do to help prevent falls? Wear shoes that: Do not have high heels. Have rubber bottoms. Are comfortable  and fit you well. Are closed at the toe. Do not wear sandals. If you use a stepladder: Make sure that it is fully opened. Do not climb a closed stepladder. Make sure that both sides of the stepladder are locked into place. Ask someone to hold it for you, if possible. Clearly mark and make sure that you can see: Any grab bars or handrails. First and last steps. Where the edge of each step is. Use tools that help you move around (mobility aids) if they are needed. These include: Canes. Walkers. Scooters. Crutches. Turn on the lights when you go into a dark area. Replace any light bulbs as soon as they burn out. Set up your furniture so you have a clear path. Avoid moving your furniture around. If any of your floors are uneven, fix them. If there are any pets around you, be aware of where they are. Review your medicines with your doctor. Some medicines can make you feel dizzy. This can increase your chance of falling. Ask your doctor what other things that you can do to help prevent falls. This information is not intended to replace advice given to you by your health care provider. Make sure you discuss any questions you have with your health care provider. Document Released: 08/07/2009 Document Revised: 03/18/2016 Document Reviewed: 11/15/2014 Elsevier Interactive Patient Education  2017 [employer].

## 2022-04-16 DIAGNOSIS — Z780 Asymptomatic menopausal state: Secondary | ICD-10-CM | POA: Diagnosis not present

## 2022-04-16 DIAGNOSIS — C50511 Malignant neoplasm of lower-outer quadrant of right female breast: Secondary | ICD-10-CM | POA: Diagnosis not present

## 2022-04-16 DIAGNOSIS — L439 Lichen planus, unspecified: Secondary | ICD-10-CM | POA: Diagnosis not present

## 2022-04-16 DIAGNOSIS — Z170 Estrogen receptor positive status [ER+]: Secondary | ICD-10-CM | POA: Diagnosis not present

## 2022-04-22 DIAGNOSIS — E1140 Type 2 diabetes mellitus with diabetic neuropathy, unspecified: Secondary | ICD-10-CM | POA: Diagnosis not present

## 2022-04-23 DIAGNOSIS — C50511 Malignant neoplasm of lower-outer quadrant of right female breast: Secondary | ICD-10-CM | POA: Diagnosis not present

## 2022-04-23 DIAGNOSIS — Z170 Estrogen receptor positive status [ER+]: Secondary | ICD-10-CM | POA: Diagnosis not present

## 2022-04-29 ENCOUNTER — Telehealth: Payer: Self-pay | Admitting: Family Medicine

## 2022-04-29 DIAGNOSIS — H40033 Anatomical narrow angle, bilateral: Secondary | ICD-10-CM | POA: Diagnosis not present

## 2022-04-29 DIAGNOSIS — F3289 Other specified depressive episodes: Secondary | ICD-10-CM

## 2022-04-29 DIAGNOSIS — H04123 Dry eye syndrome of bilateral lacrimal glands: Secondary | ICD-10-CM | POA: Diagnosis not present

## 2022-04-29 MED ORDER — ESCITALOPRAM OXALATE 20 MG PO TABS: 20.0000 mg | ORAL_TABLET | Freq: Every day | ORAL | 0 refills | Status: DC

## 2022-04-29 NOTE — Telephone Encounter (Signed)
90 day refill sent to pharmacy, patient will need to get refills when seen.

## 2022-04-29 NOTE — Telephone Encounter (Signed)
Pt needs 90 day supply with 3 refills

## 2022-04-29 NOTE — Telephone Encounter (Signed)
  Prescription Request  04/29/2022  Is this a "Controlled Substance" medicine? no  Have you seen your PCP in the last 2 weeks? Pt has 5 month f/u on 07/24/2031  If YES, route message to pool  -  If NO, patient needs to be scheduled for appointment.  What is the name of the medication or equipment? escitalopram (LEXAPRO) 20 MG tablet  amLODipine (NORVASC) 5 MG tablet  Have you contacted your pharmacy to request a refill? yes   Which pharmacy would you like this sent to? [facility] - [place] [address]  [place], [place] [id number]    Patient notified that their request is being sent to the clinical staff for review and that they should receive a response within 2 business days.

## 2022-05-04 DIAGNOSIS — N3281 Overactive bladder: Secondary | ICD-10-CM | POA: Diagnosis not present

## 2022-05-04 DIAGNOSIS — N3020 Other chronic cystitis without hematuria: Secondary | ICD-10-CM | POA: Diagnosis not present

## 2022-05-06 DIAGNOSIS — E1142 Type 2 diabetes mellitus with diabetic polyneuropathy: Secondary | ICD-10-CM | POA: Diagnosis not present

## 2022-05-06 DIAGNOSIS — B351 Tinea unguium: Secondary | ICD-10-CM | POA: Diagnosis not present

## 2022-05-06 DIAGNOSIS — M79676 Pain in unspecified toe(s): Secondary | ICD-10-CM | POA: Diagnosis not present

## 2022-05-06 DIAGNOSIS — L84 Corns and callosities: Secondary | ICD-10-CM | POA: Diagnosis not present

## 2022-05-07 DIAGNOSIS — Z780 Asymptomatic menopausal state: Secondary | ICD-10-CM | POA: Diagnosis not present

## 2022-05-07 DIAGNOSIS — Z9189 Other specified personal risk factors, not elsewhere classified: Secondary | ICD-10-CM | POA: Diagnosis not present

## 2022-05-07 DIAGNOSIS — C50511 Malignant neoplasm of lower-outer quadrant of right female breast: Secondary | ICD-10-CM | POA: Diagnosis not present

## 2022-05-07 DIAGNOSIS — Z170 Estrogen receptor positive status [ER+]: Secondary | ICD-10-CM | POA: Diagnosis not present

## 2022-05-17 ENCOUNTER — Encounter (INDEPENDENT_AMBULATORY_CARE_PROVIDER_SITE_OTHER): Payer: Medicare Other | Admitting: Ophthalmology

## 2022-05-17 DIAGNOSIS — E113313 Type 2 diabetes mellitus with moderate nonproliferative diabetic retinopathy with macular edema, bilateral: Secondary | ICD-10-CM

## 2022-05-17 DIAGNOSIS — H43813 Vitreous degeneration, bilateral: Secondary | ICD-10-CM | POA: Diagnosis not present

## 2022-05-17 DIAGNOSIS — I10 Essential (primary) hypertension: Secondary | ICD-10-CM

## 2022-05-17 DIAGNOSIS — H35033 Hypertensive retinopathy, bilateral: Secondary | ICD-10-CM | POA: Diagnosis not present

## 2022-05-20 DIAGNOSIS — Z170 Estrogen receptor positive status [ER+]: Secondary | ICD-10-CM | POA: Diagnosis not present

## 2022-05-20 DIAGNOSIS — C50511 Malignant neoplasm of lower-outer quadrant of right female breast: Secondary | ICD-10-CM | POA: Diagnosis not present

## 2022-05-20 DIAGNOSIS — Z510 Encounter for antineoplastic radiation therapy: Secondary | ICD-10-CM | POA: Diagnosis not present

## 2022-05-21 ENCOUNTER — Telehealth: Payer: Self-pay | Admitting: Family Medicine

## 2022-05-21 DIAGNOSIS — I152 Hypertension secondary to endocrine disorders: Secondary | ICD-10-CM

## 2022-05-21 MED ORDER — AMLODIPINE BESYLATE 5 MG PO TABS: 5.0000 mg | ORAL_TABLET | Freq: Every day | ORAL | 0 refills | Status: DC

## 2022-05-21 NOTE — Telephone Encounter (Signed)
  Prescription Request  05/21/2022  Is this a "Controlled Substance" medicine? no  Have you seen your PCP in the last 2 weeks? no  If YES, route message to pool  -  If NO, patient needs to be scheduled for appointment.  What is the name of the medication or equipment? amLODipine (NORVASC) 5 MG tablet  Have you contacted your pharmacy to request a refill? yes   Which pharmacy would you like this sent to? [facility] - [place], [address]   Patient notified that their request is being sent to the clinical staff for review and that they should receive a response within 2 business days.

## 2022-05-21 NOTE — Telephone Encounter (Signed)
Pt aware refill sent to pharmacy 

## 2022-05-22 DIAGNOSIS — E1140 Type 2 diabetes mellitus with diabetic neuropathy, unspecified: Secondary | ICD-10-CM | POA: Diagnosis not present

## 2022-05-26 DIAGNOSIS — C50511 Malignant neoplasm of lower-outer quadrant of right female breast: Secondary | ICD-10-CM | POA: Diagnosis not present

## 2022-05-26 DIAGNOSIS — Z510 Encounter for antineoplastic radiation therapy: Secondary | ICD-10-CM | POA: Diagnosis not present

## 2022-05-26 DIAGNOSIS — Z170 Estrogen receptor positive status [ER+]: Secondary | ICD-10-CM | POA: Diagnosis not present

## 2022-05-27 DIAGNOSIS — C50511 Malignant neoplasm of lower-outer quadrant of right female breast: Secondary | ICD-10-CM | POA: Diagnosis not present

## 2022-05-27 DIAGNOSIS — Z170 Estrogen receptor positive status [ER+]: Secondary | ICD-10-CM | POA: Diagnosis not present

## 2022-06-03 ENCOUNTER — Telehealth: Payer: Self-pay | Admitting: Family Medicine

## 2022-06-03 DIAGNOSIS — Z794 Long term (current) use of insulin: Secondary | ICD-10-CM

## 2022-06-03 MED ORDER — FREESTYLE LITE TEST VI STRP: ORAL_STRIP | 2 refills | Status: AC

## 2022-06-03 NOTE — Telephone Encounter (Signed)
  Prescription Request  06/03/2022  Is this a "Controlled Substance" medicine? no  Have you seen your PCP in the last 2 weeks? No pt has appt with PCP in september  If YES, route message to pool  -  If NO, patient needs to be scheduled for appointment.  What is the name of the medication or equipment? glucose blood (FREESTYLE LITE) test strip   Have you contacted your pharmacy to request a refill? yes   Which pharmacy would you like this sent to? [facility] in [place]   Patient notified that their request is being sent to the clinical staff for review and that they should receive a response within 2 business days.

## 2022-06-03 NOTE — Telephone Encounter (Signed)
Refill sent to pharmacy, patient aware ?

## 2022-06-07 DIAGNOSIS — Z510 Encounter for antineoplastic radiation therapy: Secondary | ICD-10-CM | POA: Diagnosis not present

## 2022-06-07 DIAGNOSIS — Z170 Estrogen receptor positive status [ER+]: Secondary | ICD-10-CM | POA: Diagnosis not present

## 2022-06-07 DIAGNOSIS — C50511 Malignant neoplasm of lower-outer quadrant of right female breast: Secondary | ICD-10-CM | POA: Diagnosis not present

## 2022-06-09 DIAGNOSIS — Z170 Estrogen receptor positive status [ER+]: Secondary | ICD-10-CM | POA: Diagnosis not present

## 2022-06-09 DIAGNOSIS — C50511 Malignant neoplasm of lower-outer quadrant of right female breast: Secondary | ICD-10-CM | POA: Diagnosis not present

## 2022-06-09 DIAGNOSIS — Z510 Encounter for antineoplastic radiation therapy: Secondary | ICD-10-CM | POA: Diagnosis not present

## 2022-06-11 DIAGNOSIS — C50511 Malignant neoplasm of lower-outer quadrant of right female breast: Secondary | ICD-10-CM | POA: Diagnosis not present

## 2022-06-11 DIAGNOSIS — Z510 Encounter for antineoplastic radiation therapy: Secondary | ICD-10-CM | POA: Diagnosis not present

## 2022-06-11 DIAGNOSIS — Z170 Estrogen receptor positive status [ER+]: Secondary | ICD-10-CM | POA: Diagnosis not present

## 2022-06-15 DIAGNOSIS — C50511 Malignant neoplasm of lower-outer quadrant of right female breast: Secondary | ICD-10-CM | POA: Diagnosis not present

## 2022-06-15 DIAGNOSIS — Z510 Encounter for antineoplastic radiation therapy: Secondary | ICD-10-CM | POA: Diagnosis not present

## 2022-06-15 DIAGNOSIS — Z170 Estrogen receptor positive status [ER+]: Secondary | ICD-10-CM | POA: Diagnosis not present

## 2022-06-17 DIAGNOSIS — Z170 Estrogen receptor positive status [ER+]: Secondary | ICD-10-CM | POA: Diagnosis not present

## 2022-06-17 DIAGNOSIS — Z510 Encounter for antineoplastic radiation therapy: Secondary | ICD-10-CM | POA: Diagnosis not present

## 2022-06-17 DIAGNOSIS — C50511 Malignant neoplasm of lower-outer quadrant of right female breast: Secondary | ICD-10-CM | POA: Diagnosis not present

## 2022-06-21 DIAGNOSIS — E1140 Type 2 diabetes mellitus with diabetic neuropathy, unspecified: Secondary | ICD-10-CM | POA: Diagnosis not present

## 2022-06-23 ENCOUNTER — Other Ambulatory Visit: Payer: Self-pay | Admitting: Family Medicine

## 2022-06-23 DIAGNOSIS — E785 Hyperlipidemia, unspecified: Secondary | ICD-10-CM

## 2022-06-23 DIAGNOSIS — B372 Candidiasis of skin and nail: Secondary | ICD-10-CM

## 2022-06-23 DIAGNOSIS — K219 Gastro-esophageal reflux disease without esophagitis: Secondary | ICD-10-CM

## 2022-06-23 MED ORDER — NYSTATIN 100000 UNIT/GM EX POWD: CUTANEOUS | 1 refills | Status: AC

## 2022-06-23 MED ORDER — ATORVASTATIN CALCIUM 10 MG PO TABS: 10.0000 mg | ORAL_TABLET | Freq: Every day | ORAL | 0 refills | Status: DC

## 2022-06-23 MED ORDER — ESOMEPRAZOLE MAGNESIUM 20 MG PO CPDR: 20.0000 mg | DELAYED_RELEASE_CAPSULE | Freq: Every day | ORAL | 0 refills | Status: DC

## 2022-06-23 NOTE — Telephone Encounter (Signed)
  Prescription Request  06/23/2022  Is this a "Controlled Substance" medicine? no  Have you seen your PCP in the last 2 weeks? Appt 9/29  If YES, route message to pool  -  If NO, patient needs to be scheduled for appointment.  What is the name of the medication or equipment? nystatin powder, atorvastatin (LIPITOR) 10 MG tablet, esomeprazole (NEXIUM) 20 MG capsule  Have you contacted your pharmacy to request a refill? yes   Which pharmacy would you like this sent to? [facility] - [place], [address]   Patient notified that their request is being sent to the clinical staff for review and that they should receive a response within 2 business days.

## 2022-06-24 NOTE — Telephone Encounter (Signed)
Pt aware refills sent to pharmacy 

## 2022-07-05 ENCOUNTER — Encounter (INDEPENDENT_AMBULATORY_CARE_PROVIDER_SITE_OTHER): Payer: Medicare Other | Admitting: Ophthalmology

## 2022-07-05 DIAGNOSIS — E113392 Type 2 diabetes mellitus with moderate nonproliferative diabetic retinopathy without macular edema, left eye: Secondary | ICD-10-CM | POA: Diagnosis not present

## 2022-07-05 DIAGNOSIS — E113311 Type 2 diabetes mellitus with moderate nonproliferative diabetic retinopathy with macular edema, right eye: Secondary | ICD-10-CM | POA: Diagnosis not present

## 2022-07-05 DIAGNOSIS — H43813 Vitreous degeneration, bilateral: Secondary | ICD-10-CM

## 2022-07-05 DIAGNOSIS — I10 Essential (primary) hypertension: Secondary | ICD-10-CM

## 2022-07-05 DIAGNOSIS — H35033 Hypertensive retinopathy, bilateral: Secondary | ICD-10-CM | POA: Diagnosis not present

## 2022-07-20 DIAGNOSIS — R21 Rash and other nonspecific skin eruption: Secondary | ICD-10-CM | POA: Diagnosis not present

## 2022-07-20 DIAGNOSIS — Z780 Asymptomatic menopausal state: Secondary | ICD-10-CM | POA: Diagnosis not present

## 2022-07-20 DIAGNOSIS — Z170 Estrogen receptor positive status [ER+]: Secondary | ICD-10-CM | POA: Diagnosis not present

## 2022-07-20 DIAGNOSIS — L439 Lichen planus, unspecified: Secondary | ICD-10-CM | POA: Diagnosis not present

## 2022-07-20 DIAGNOSIS — D696 Thrombocytopenia, unspecified: Secondary | ICD-10-CM | POA: Diagnosis not present

## 2022-07-20 DIAGNOSIS — C50511 Malignant neoplasm of lower-outer quadrant of right female breast: Secondary | ICD-10-CM | POA: Diagnosis not present

## 2022-07-21 DIAGNOSIS — E1140 Type 2 diabetes mellitus with diabetic neuropathy, unspecified: Secondary | ICD-10-CM | POA: Diagnosis not present

## 2022-07-22 ENCOUNTER — Ambulatory Visit: Payer: Medicare Other | Admitting: Family

## 2022-07-23 ENCOUNTER — Ambulatory Visit (INDEPENDENT_AMBULATORY_CARE_PROVIDER_SITE_OTHER): Payer: Medicare Other | Admitting: Family Medicine

## 2022-07-23 ENCOUNTER — Encounter: Payer: Self-pay | Admitting: Family Medicine

## 2022-07-23 VITALS — BP 126/65 | HR 68 | Temp 98.4°F | Ht 63.0 in | Wt 140.0 lb

## 2022-07-23 DIAGNOSIS — E785 Hyperlipidemia, unspecified: Secondary | ICD-10-CM

## 2022-07-23 DIAGNOSIS — L249 Irritant contact dermatitis, unspecified cause: Secondary | ICD-10-CM | POA: Diagnosis not present

## 2022-07-23 DIAGNOSIS — L304 Erythema intertrigo: Secondary | ICD-10-CM

## 2022-07-23 DIAGNOSIS — E119 Type 2 diabetes mellitus without complications: Secondary | ICD-10-CM

## 2022-07-23 DIAGNOSIS — E1169 Type 2 diabetes mellitus with other specified complication: Secondary | ICD-10-CM | POA: Diagnosis not present

## 2022-07-23 DIAGNOSIS — K219 Gastro-esophageal reflux disease without esophagitis: Secondary | ICD-10-CM

## 2022-07-23 DIAGNOSIS — E1142 Type 2 diabetes mellitus with diabetic polyneuropathy: Secondary | ICD-10-CM

## 2022-07-23 DIAGNOSIS — Z794 Long term (current) use of insulin: Secondary | ICD-10-CM | POA: Diagnosis not present

## 2022-07-23 DIAGNOSIS — F3289 Other specified depressive episodes: Secondary | ICD-10-CM

## 2022-07-23 DIAGNOSIS — D696 Thrombocytopenia, unspecified: Secondary | ICD-10-CM

## 2022-07-23 DIAGNOSIS — Z0000 Encounter for general adult medical examination without abnormal findings: Secondary | ICD-10-CM

## 2022-07-23 DIAGNOSIS — N1831 Chronic kidney disease, stage 3a: Secondary | ICD-10-CM

## 2022-07-23 DIAGNOSIS — E1159 Type 2 diabetes mellitus with other circulatory complications: Secondary | ICD-10-CM

## 2022-07-23 DIAGNOSIS — I152 Hypertension secondary to endocrine disorders: Secondary | ICD-10-CM

## 2022-07-23 MED ORDER — ATORVASTATIN CALCIUM 10 MG PO TABS: 10.0000 mg | ORAL_TABLET | Freq: Every day | ORAL | 3 refills | Status: DC

## 2022-07-23 MED ORDER — NYSTATIN-TRIAMCINOLONE 100000-0.1 UNIT/GM-% EX OINT: TOPICAL_OINTMENT | CUTANEOUS | 3 refills | Status: AC

## 2022-07-23 MED ORDER — AMLODIPINE BESYLATE 5 MG PO TABS: 5.0000 mg | ORAL_TABLET | Freq: Every day | ORAL | 3 refills | Status: DC

## 2022-07-23 MED ORDER — ESOMEPRAZOLE MAGNESIUM 20 MG PO CPDR: 20.0000 mg | DELAYED_RELEASE_CAPSULE | Freq: Every day | ORAL | 3 refills | Status: DC

## 2022-07-23 MED ORDER — ESCITALOPRAM OXALATE 20 MG PO TABS: 20.0000 mg | ORAL_TABLET | Freq: Every day | ORAL | 3 refills | Status: DC

## 2022-07-23 MED ORDER — TRIAMCINOLONE ACETONIDE 0.1 % EX CREA: 1.0000 | TOPICAL_CREAM | Freq: Two times a day (BID) | CUTANEOUS | 0 refills | Status: AC

## 2022-07-23 NOTE — Progress Notes (Signed)
[name] is a 81 y.o. female presents to office today for annual physical exam examination.    Concerns today include: 1. Hearing difficulty Patient reports bilateral hearing difficulty has been ongoing for some time now.  She denies any tinnitus.  She reports that she was previously evaluated for hearing aids but that this was coming from a place that sold hearing aids and they did not really work well.  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  2.  Rash Patient reports a rash under the right breast has been present for the last couple of days.  She had some leftover triamcinolone/nystatin cream and has been applying that.  That seems to be helping and she would like to get a renewal.  Of note this is the side that she had previous breast surgery and radiation x5 to.  Occupation: Retired  Diet: Diplomatic Services operational officer, Exercise: No structured Last eye exam: Up-to-date Last dental exam: Up-to-date Last colonoscopy: Up-to-date Last mammogram: Up-to-date Last pap smear: N/A Refills needed today: All Immunizations needed: Flu shot Immunization History  Administered Date(s) Administered   Fluad Quad(high Dose 65+) 07/09/2019, 09/08/2020   Influenza, High Dose Seasonal PF 07/19/2016, 10/13/2017, 08/28/2018   Influenza,inj,Quad PF,6+ Mos 07/30/2013, 07/24/2014, 08/04/2015   Moderna SARS-COV2 Booster Vaccination 09/13/2020   Moderna Sars-Covid-2 Vaccination 11/29/2019, 12/31/2019   Pneumococcal Conjugate-13 11/27/2014   Pneumococcal Polysaccharide-23 10/22/2009   Td 10/25/2004   Tdap 11/27/2014   Zoster Recombinat (Shingrix) 08/19/2021, 10/30/2021   Zoster, Live 04/06/2012     Past Medical History:  Diagnosis Date   Acute cystitis    Anxiety    Breast cancer detected by national screening programme ([place]) 03/12/2022   Depression    Diabetes mellitus type II    Diabetic retinopathy    DM  type 2 causing CKD stage 3 (HCC)    Fibromyalgia    GERD (gastroesophageal reflux disease)    Hyperlipidemia    Hypertension    IBS (irritable bowel syndrome)    IBS (irritable bowel syndrome)    Insomnia    Lichen planus    vulvar   Pancreatitis, gallstone    Postmenopausal    Radial scar of breast 06/04/2016   Urge incontinence of urine    Social History   Socioeconomic History   Marital status: Widowed    Spouse name: Not on file   Number of children: 3   Years of education: 84   Highest education level: Some college, no degree  Occupational History   Occupation: retired    Fish farm manager: RETIRED    Comment: bookeeping  Tobacco Use   Smoking status: Never   Smokeless tobacco: Never  Vaping Use   Vaping Use: Never used  Substance and Sexual Activity   Alcohol use: No   Drug use: No   Sexual activity: Not Currently  Other Topics Concern   Not on file  Social History Narrative   Widowed in 2010; lives alone. Children live out of town   Social Determinants of Health   Financial Resource Strain: [place]  (04/14/2022)   Overall Financial Resource Strain (CARDIA)    Difficulty of Paying Living Expenses: Not hard at all  Food Insecurity: No Food Insecurity (04/14/2022)   Hunger Vital Sign    Worried About Running Out of Food in the Last Year: Never true    Ran Out of Food in the  Last Year: Never true  Transportation Needs: No Transportation Needs (04/14/2022)   PRAPARE - Hydrologist (Medical): No    Lack of Transportation (Non-Medical): No  Physical Activity: Insufficiently Active (04/14/2022)   Exercise Vital Sign    Days of Exercise per Week: 7 days    Minutes of Exercise per Session: 20 min  Stress: No Stress Concern Present (04/14/2022)   [place]    Feeling of Stress : Not at all  Social Connections: Socially Isolated (04/14/2022)   Social Connection and Isolation Panel  [NHANES]    Frequency of Communication with Friends and Family: More than three times a week    Frequency of Social Gatherings with Friends and Family: Three times a week    Attends Religious Services: Never    Active Member of Clubs or Organizations: No    Attends Archivist Meetings: Never    Marital Status: Widowed  Intimate Partner Violence: Not At Risk (04/14/2022)   Humiliation, Afraid, Rape, and Kick questionnaire    Fear of Current or Ex-Partner: No    Emotionally Abused: No    Physically Abused: No    Sexually Abused: No   Past Surgical History:  Procedure Laterality Date   ANKLE SURGERY  02/2004   BREAST LUMPECTOMY WITH RADIOACTIVE SEED LOCALIZATION Left 01/22/2016   Procedure: LEFT BREAST LUMPECTOMY WITH RADIOACTIVE SEED LOCALIZATION;  Surgeon: Autumn Messing III, MD;  Location: [facility];  Service: General;  Laterality: Left;   CATARACT EXTRACTION W/ INTRAOCULAR LENS IMPLANT  09/2003, 12/2003   CHOLECYSTECTOMY  2004   EYE SURGERY     laser treatments   JOINT REPLACEMENT     TOTAL KNEE ARTHROPLASTY     Left   TRIGGER FINGER RELEASE Left 01/30/2002   TUBAL LIGATION  1970   Family History  Problem Relation Age of Onset   Diabetes Mother    Hypertension Mother    Kidney disease Mother    Heart disease Mother    Peripheral vascular disease Mother        amputation   Cancer Father        prostate   Diabetes Father    Heart disease Father        CABG   Hypertension Father    Heart attack Father    Depression Sister    Hypertension Sister    Neuropathy Sister     Current Outpatient Medications:    acetaminophen (TYLENOL) 325 MG tablet, Take 325 mg by mouth as needed., Disp: , Rfl:    amLODipine (NORVASC) 5 MG tablet, Take 1 tablet (5 mg total) by mouth daily., Disp: 90 tablet, Rfl: 0   aspirin 81 MG tablet, Take 81 mg by mouth every other day., Disp: , Rfl:    atorvastatin (LIPITOR) 10 MG tablet, Take 1 tablet (10 mg total) by mouth at  bedtime., Disp: 90 tablet, Rfl: 0   BESIVANCE 0.6 % SUSP, Apply to eye., Disp: , Rfl:    Calcium Carb-Cholecalciferol 600-200 MG-UNIT TABS, Take 1 tablet by mouth 2 (two) times daily., Disp: , Rfl:    desonide (DESOWEN) 0.05 % cream, Apply topically 2 (two) times daily., Disp: 30 g, Rfl: 1   diclofenac Sodium (VOLTAREN) 1 % GEL, Apply 4 g topically 4 (four) times daily as needed (arthritis)., Disp: 400 g, Rfl: prn   Dulaglutide (TRULICITY) 1.5 AV/6.9VX SOPN, Inject 1.5 mg into the skin once a week.,  Disp: 7.5 mL, Rfl: 3   escitalopram (LEXAPRO) 20 MG tablet, Take 1 tablet (20 mg total) by mouth daily., Disp: 90 tablet, Rfl: 0   esomeprazole (NEXIUM) 20 MG capsule, Take 1 capsule (20 mg total) by mouth daily at 12 noon., Disp: 90 capsule, Rfl: 0   fluticasone (FLONASE) 50 MCG/ACT nasal spray, Place 2 sprays into both nostrils daily., Disp: 16 g, Rfl: 6   glucose blood (FREESTYLE LITE) test strip, Use qid to check blood sugar. Dx E11.9, Disp: 400 each, Rfl: 2   insulin aspart (NOVOLOG FLEXPEN) 100 UNIT/ML FlexPen, BG less than 80 - no insulin; 80 to 120 - 3 units; 121 to 150 - 4 units; 151 to 200 - 5 units; 201 to 250 - 6 units; 250 or above - 7 units, Disp: 60 mL, Rfl: PRN   insulin glargine (LANTUS SOLOSTAR) 100 UNIT/ML Solostar Pen, Inject 26 Units into the skin every morning., Disp: 45 mL, Rfl: 3   insulin glargine (LANTUS) 100 UNIT/ML Solostar Pen, Inject into the skin., Disp: , Rfl:    Insulin Pen Needle 31G X 5 MM MISC, 1 Device by Does not apply route 4 (four) times daily., Disp: 300 each, Rfl: 4   Multiple Vitamin (MULTIVITAMIN) tablet, Take 1 tablet by mouth daily., Disp: , Rfl:    nystatin powder, APPLY UNDER BILATERAL BREASTS AND GROIN TWICE A DAY (If needed), Disp: 60 g, Rfl: 1   nystatin-triamcinolone ointment (MYCOLOG), APPLY TOPICALLY 2 (TWO) TIMES DAILY IF NEEDED FOR rash.  NOT A PREVENTATIVE MEDICATION., Disp: 60 g, Rfl: 3   Polyethyl Glycol-Propyl Glycol 0.4-0.3 % SOLN, Apply 1 drop  to eye daily as needed (for dry eye relief). , Disp: , Rfl:    polyethylene glycol (MIRALAX / GLYCOLAX) packet, Take 17 g by mouth daily as needed. , Disp: , Rfl:    Probiotic Product (PROBIOTIC ADVANCED PO), Take 1 capsule by mouth daily. , Disp: , Rfl:    tamoxifen (NOLVADEX) 10 MG tablet, Take 10 mg by mouth 2 (two) times daily., Disp: , Rfl:    traZODone (DESYREL) 100 MG tablet, Take 1 tablet (100 mg total) by mouth at bedtime., Disp: 90 tablet, Rfl: 3   triamcinolone cream (KENALOG) 0.1 %, Apply 1 application. topically 2 (two) times daily. X7-14 days for rash., Disp: 30 g, Rfl: 0   Vibegron 75 MG TABS, Take by mouth., Disp: , Rfl:   Allergies  Allergen Reactions   Latex Anaphylaxis and Rash   Ambien [Zolpidem] Other (See Comments)    Hallucinations   Augmentin [Amoxicillin-Pot Clavulanate] Nausea And Vomiting   Belsomra [Suvorexant] Other (See Comments)    Hallucinations   Hydrocodone-Acetaminophen Other (See Comments)    hallucinations   Hyoscyamine Sulfate Other (See Comments)   Melatonin Other (See Comments)    Adverse reaction-"keeps me awake"   Mobic [Meloxicam]    Morphine Nausea And Vomiting    REACTION: hallucinations,GI upset   Oxycodone Other (See Comments)    Hallucinations   Tramadol     Unknown reaction   Celebrex [Celecoxib] Itching and Rash   Sulfa Antibiotics Rash     ROS: Review of Systems Pertinent items noted in HPI and remainder of comprehensive ROS otherwise negative.    Physical exam BP 126/65   Pulse 68   Temp 98.4 F (36.9 C)   Ht 5' 3"  (1.6 m)   Wt 140 lb (63.5 kg)   SpO2 100%   BMI 24.80 kg/m  General appearance: alert, cooperative, appears stated  age, and no distress Head: Normocephalic, without obvious abnormality, atraumatic Eyes: negative findings: lids and lashes normal, conjunctivae and sclerae normal, corneas clear, and pupils equal, round, reactive to light and accomodation Ears: normal TM's and external ear canals both  ears Nose: Nares normal. Septum midline. Mucosa normal. No drainage or sinus tenderness. Throat: lips, mucosa, and tongue normal; teeth and gums normal Neck: no adenopathy, no carotid bruit, supple, symmetrical, trachea midline, and thyroid not enlarged, symmetric, no tenderness/mass/nodules Back: symmetric, no curvature. ROM normal. No CVA tenderness. Lungs: clear to auscultation bilaterally Breasts:  pendulous breasts, erythema and satellite lesions noted under the right breast Heart: regular rate and rhythm, S1, S2 normal, no murmur, click, rub or gallop Abdomen: soft, non-tender; bowel sounds normal; no masses,  no organomegaly Extremities: extremities normal, atraumatic, no cyanosis or edema Pulses: 2+ and symmetric Skin:  Multiple pigmented nevi and seborrheic keratoses appreciated Lymph nodes: Cervical, supraclavicular, and axillary nodes normal. Neurologic: Grossly normal Psych: Mood stable, speech normal, affect appropriate     07/23/2022   11:41 AM 04/14/2022    2:09 PM 03/15/2022    3:00 PM  Depression screen PHQ 2/9  Decreased Interest 0 1 1  Down, Depressed, Hopeless 0 1 1  PHQ - 2 Score 0 2 2  Altered sleeping  0 0  Tired, decreased energy  1 1  Change in appetite  0 0  Feeling bad or failure about yourself   0 0  Trouble concentrating  0 0  Moving slowly or fidgety/restless  0 0  Suicidal thoughts  0 0  PHQ-9 Score  3 3  Difficult doing work/chores  Not difficult at all Not difficult at all      Assessment/ Plan: [name] here for annual physical exam.   Annual physical exam  Type 2 diabetes mellitus with insulin therapy ([place]) - Plan: Bayer DCA Hb A1c Waived  Dyslipidemia associated with type 2 diabetes mellitus ([place]) - Plan: Lipid panel, CMP14+EGFR, atorvastatin (LIPITOR) 10 MG tablet  Hypertension associated with diabetes ([place]) - Plan: amLODipine (NORVASC) 5 MG tablet  Stage 3a chronic kidney disease ([place]) - Plan: CMP14+EGFR  Diabetic  polyneuropathy associated with type 2 diabetes mellitus (HCC)  Low platelet count (HCC) - Plan: CBC  Gastroesophageal reflux disease without esophagitis - Plan: esomeprazole (NEXIUM) 20 MG capsule  Other depression - Plan: escitalopram (LEXAPRO) 20 MG tablet  Intertrigo - Plan: nystatin-triamcinolone ointment (MYCOLOG)  Irritant contact dermatitis, unspecified trigger - Plan: triamcinolone cream (KENALOG) 0.1 %  She will be due for flu shot but is going to defer this until her lab appointment next Wednesday.  Sugar, fasting labs have been ordered.  I have CCed these to her endocrinologist, Dr. Steffanie Dunn  Blood pressure is well controlled.  No changes.  Renewals have been sent  Depression is stable.  Continue Lexapro  I counseled her on associations with renal disease and dementia that the PPI class has.  Encouraged her to use this very sparingly and only if needed  Lesion of concern under the right breast looks to be intertriginous.  No evidence of secondary bacterial infection.  Continue nystatin and triamcinolone ointment as needed  Counseled on healthy lifestyle choices, including diet (rich in fruits, vegetables and lean meats and low in salt and simple carbohydrates) and exercise (at least 30 minutes of moderate physical activity daily).    [name] M. Lajuana Ripple, DO

## 2022-07-23 NOTE — Patient Instructions (Signed)
Intertrigo Intertrigo is skin irritation (inflammation) that happens in warm, moist areas of the body. The irritation can cause a rash and make skin raw and itchy. The rash is usually pink or red. It happens mostly between folds of skin or where skin rubs together, such as: Between the toes. In the armpits. In the groin area. Under the belly. Under the breasts. Around the butt area. This condition is not passed from person to person (is not contagious). What are the causes? Heat, moisture, rubbing, and not enough air movement. The condition can be made worse by: Sweat. Bacteria. A fungus, such as yeast. What increases the risk? Moisture in your skin folds. You are more likely to develop this condition if you: Have diabetes. Are overweight. Are not able to move around. Live in a warm and moist climate. Wear splints, braces, or other medical devices. Are not able to control your pee (urine) or poop (stool). What are the signs or symptoms? A pink or red skin rash in the skin fold or near the skin fold. Raw or scaly skin. Itching. A burning feeling. Bleeding. Leaking fluid. A bad smell. How is this treated? Cleaning and drying your skin. Taking an antibiotic medicine or using an antibiotic skin cream for a bacterial infection. Using an antifungal cream on your skin or taking pills for an infection that was caused by a fungus, such as yeast. Using a steroid ointment to stop the itching and irritation. Separating the skin fold with a clean cotton cloth to absorb moisture and allow air to flow into the area. Follow these instructions at home: Keep the affected area clean and dry. Do not scratch your skin. Stay cool as much as you can. Use an air conditioner or a fan, if you have one. Apply over-the-counter and prescription medicines only as told by your doctor. If you were prescribed an antibiotic medicine, use it as told by your doctor. Do not stop using the antibiotic even if  your condition starts to get better. Keep all follow-up visits as told by your doctor. This is important. How is this prevented?  Stay at a healthy weight. Take care of your feet. This is very important if you have diabetes. You should: Wear shoes that fit well. Keep your feet dry. Wear clean cotton or wool socks. Protect the skin in your groin and butt area as told by your doctor. To do this: Follow a regular cleaning routine. Use creams, powders, or ointments that protect your skin. Change protection pads often. Do not wear tight clothes. Wear clothes that: Are loose. Take moisture away from your body. Are made of cotton. Wear a bra that gives good support, if needed. Shower and dry yourself well after being active. Use a hair dryer on a cool setting to dry between skin folds. Keep your blood sugar under control if you have diabetes. Contact a doctor if: Your symptoms do not get better with treatment. Your symptoms get worse or they spread. You notice more redness and warmth. You have a fever. Summary Intertrigo is skin irritation that occurs when folds of skin rub together. This condition is caused by heat, moisture, and rubbing. This condition may be treated by cleaning and drying your skin and with medicines. Apply over-the-counter and prescription medicines only as told by your doctor. Keep all follow-up visits as told by your doctor. This is important. This information is not intended to replace advice given to you by your health care provider. Make sure you discuss  any questions you have with your health care provider. Document Revised: 12/23/2021 Document Reviewed: 07/27/2021 Elsevier Patient Education  [place].

## 2022-07-28 ENCOUNTER — Ambulatory Visit (INDEPENDENT_AMBULATORY_CARE_PROVIDER_SITE_OTHER): Payer: Medicare Other

## 2022-07-28 ENCOUNTER — Other Ambulatory Visit: Payer: Medicare Other

## 2022-07-28 DIAGNOSIS — E119 Type 2 diabetes mellitus without complications: Secondary | ICD-10-CM | POA: Diagnosis not present

## 2022-07-28 DIAGNOSIS — N1831 Chronic kidney disease, stage 3a: Secondary | ICD-10-CM | POA: Diagnosis not present

## 2022-07-28 DIAGNOSIS — E1169 Type 2 diabetes mellitus with other specified complication: Secondary | ICD-10-CM

## 2022-07-28 DIAGNOSIS — Z23 Encounter for immunization: Secondary | ICD-10-CM | POA: Diagnosis not present

## 2022-07-28 DIAGNOSIS — D696 Thrombocytopenia, unspecified: Secondary | ICD-10-CM

## 2022-07-28 DIAGNOSIS — E785 Hyperlipidemia, unspecified: Secondary | ICD-10-CM | POA: Diagnosis not present

## 2022-07-28 DIAGNOSIS — Z794 Long term (current) use of insulin: Secondary | ICD-10-CM | POA: Diagnosis not present

## 2022-07-28 LAB — CMP14+EGFR
ALT: 19 IU/L (ref 0–32)
AST: 27 IU/L (ref 0–40)
Albumin/Globulin Ratio: 1.8 (ref 1.2–2.2)
Albumin: 3.7 g/dL (ref 3.7–4.7)
Alkaline Phosphatase: 44 IU/L (ref 44–121)
BUN/Creatinine Ratio: 18 (ref 12–28)
BUN: 23 mg/dL (ref 8–27)
Bilirubin Total: 0.4 mg/dL (ref 0.0–1.2)
CO2: 25 mmol/L (ref 20–29)
Calcium: 9 mg/dL (ref 8.7–10.3)
Chloride: 100 mmol/L (ref 96–106)
Creatinine, Ser: 1.27 mg/dL — ABNORMAL HIGH (ref 0.57–1.00)
Globulin, Total: 2.1 g/dL (ref 1.5–4.5)
Glucose: 209 mg/dL — ABNORMAL HIGH (ref 70–99)
Potassium: 4.1 mmol/L (ref 3.5–5.2)
Sodium: 138 mmol/L (ref 134–144)
Total Protein: 5.8 g/dL — ABNORMAL LOW (ref 6.0–8.5)
eGFR: 42 mL/min/{1.73_m2} — ABNORMAL LOW (ref >59)

## 2022-07-28 LAB — CBC
Hematocrit: 39.2 % (ref 34.0–46.6)
Hemoglobin: 13.3 g/dL (ref 11.1–15.9)
MCH: 31.4 pg (ref 26.6–33.0)
MCHC: 33.9 g/dL (ref 31.5–35.7)
MCV: 93 fL (ref 79–97)
Platelets: 121 10*3/uL — ABNORMAL LOW (ref 150–450)
RBC: 4.23 x10E6/uL (ref 3.77–5.28)
RDW: 12.7 % (ref 11.7–15.4)
WBC: 3.7 10*3/uL (ref 3.4–10.8)

## 2022-07-28 LAB — LIPID PANEL
Chol/HDL Ratio: 1.7 ratio (ref 0.0–4.4)
Cholesterol, Total: 127 mg/dL (ref 100–199)
HDL: 73 mg/dL (ref >39)
LDL Chol Calc (NIH): 41 mg/dL (ref 0–99)
Triglycerides: 64 mg/dL (ref 0–149)
VLDL Cholesterol Cal: 13 mg/dL (ref 5–40)

## 2022-07-28 LAB — BAYER DCA HB A1C WAIVED: HB A1C (BAYER DCA - WAIVED): 6.8 % — ABNORMAL HIGH (ref 4.8–5.6)

## 2022-07-28 NOTE — Progress Notes (Signed)
Flu shot given to patient and tolerated well.

## 2022-07-29 DIAGNOSIS — E1142 Type 2 diabetes mellitus with diabetic polyneuropathy: Secondary | ICD-10-CM | POA: Diagnosis not present

## 2022-07-29 DIAGNOSIS — B351 Tinea unguium: Secondary | ICD-10-CM | POA: Diagnosis not present

## 2022-07-29 DIAGNOSIS — L84 Corns and callosities: Secondary | ICD-10-CM | POA: Diagnosis not present

## 2022-07-29 DIAGNOSIS — M79676 Pain in unspecified toe(s): Secondary | ICD-10-CM | POA: Diagnosis not present

## 2022-08-05 DIAGNOSIS — R350 Frequency of micturition: Secondary | ICD-10-CM | POA: Diagnosis not present

## 2022-08-09 ENCOUNTER — Ambulatory Visit: Payer: Medicare Other

## 2022-08-16 DIAGNOSIS — Z794 Long term (current) use of insulin: Secondary | ICD-10-CM | POA: Diagnosis not present

## 2022-08-16 DIAGNOSIS — E1140 Type 2 diabetes mellitus with diabetic neuropathy, unspecified: Secondary | ICD-10-CM | POA: Diagnosis not present

## 2022-08-20 DIAGNOSIS — E1140 Type 2 diabetes mellitus with diabetic neuropathy, unspecified: Secondary | ICD-10-CM | POA: Diagnosis not present

## 2022-08-23 ENCOUNTER — Encounter (INDEPENDENT_AMBULATORY_CARE_PROVIDER_SITE_OTHER): Payer: Medicare Other | Admitting: Ophthalmology

## 2022-08-23 DIAGNOSIS — H43813 Vitreous degeneration, bilateral: Secondary | ICD-10-CM

## 2022-08-23 DIAGNOSIS — I10 Essential (primary) hypertension: Secondary | ICD-10-CM | POA: Diagnosis not present

## 2022-08-23 DIAGNOSIS — E113313 Type 2 diabetes mellitus with moderate nonproliferative diabetic retinopathy with macular edema, bilateral: Secondary | ICD-10-CM

## 2022-08-23 DIAGNOSIS — H35033 Hypertensive retinopathy, bilateral: Secondary | ICD-10-CM

## 2022-09-19 DIAGNOSIS — E1140 Type 2 diabetes mellitus with diabetic neuropathy, unspecified: Secondary | ICD-10-CM | POA: Diagnosis not present

## 2022-10-07 DIAGNOSIS — L84 Corns and callosities: Secondary | ICD-10-CM | POA: Diagnosis not present

## 2022-10-07 DIAGNOSIS — E1142 Type 2 diabetes mellitus with diabetic polyneuropathy: Secondary | ICD-10-CM | POA: Diagnosis not present

## 2022-10-07 DIAGNOSIS — B351 Tinea unguium: Secondary | ICD-10-CM | POA: Diagnosis not present

## 2022-10-07 DIAGNOSIS — M79676 Pain in unspecified toe(s): Secondary | ICD-10-CM | POA: Diagnosis not present

## 2022-10-11 ENCOUNTER — Encounter (INDEPENDENT_AMBULATORY_CARE_PROVIDER_SITE_OTHER): Payer: Medicare Other | Admitting: Ophthalmology

## 2022-10-11 DIAGNOSIS — I10 Essential (primary) hypertension: Secondary | ICD-10-CM | POA: Diagnosis not present

## 2022-10-11 DIAGNOSIS — H35033 Hypertensive retinopathy, bilateral: Secondary | ICD-10-CM | POA: Diagnosis not present

## 2022-10-11 DIAGNOSIS — E113312 Type 2 diabetes mellitus with moderate nonproliferative diabetic retinopathy with macular edema, left eye: Secondary | ICD-10-CM | POA: Diagnosis not present

## 2022-10-11 DIAGNOSIS — H43813 Vitreous degeneration, bilateral: Secondary | ICD-10-CM | POA: Diagnosis not present

## 2022-10-11 DIAGNOSIS — E113391 Type 2 diabetes mellitus with moderate nonproliferative diabetic retinopathy without macular edema, right eye: Secondary | ICD-10-CM | POA: Diagnosis not present

## 2022-10-13 DIAGNOSIS — E1140 Type 2 diabetes mellitus with diabetic neuropathy, unspecified: Secondary | ICD-10-CM | POA: Diagnosis not present

## 2022-10-27 DIAGNOSIS — L300 Nummular dermatitis: Secondary | ICD-10-CM | POA: Diagnosis not present

## 2022-10-27 DIAGNOSIS — L0100 Impetigo, unspecified: Secondary | ICD-10-CM | POA: Diagnosis not present

## 2022-11-01 DIAGNOSIS — Z170 Estrogen receptor positive status [ER+]: Secondary | ICD-10-CM | POA: Diagnosis not present

## 2022-11-01 DIAGNOSIS — L439 Lichen planus, unspecified: Secondary | ICD-10-CM | POA: Diagnosis not present

## 2022-11-01 DIAGNOSIS — C50511 Malignant neoplasm of lower-outer quadrant of right female breast: Secondary | ICD-10-CM | POA: Diagnosis not present

## 2022-11-01 DIAGNOSIS — R21 Rash and other nonspecific skin eruption: Secondary | ICD-10-CM | POA: Diagnosis not present

## 2022-11-01 DIAGNOSIS — D696 Thrombocytopenia, unspecified: Secondary | ICD-10-CM | POA: Diagnosis not present

## 2022-11-03 ENCOUNTER — Other Ambulatory Visit: Payer: Self-pay | Admitting: Family Medicine

## 2022-11-03 ENCOUNTER — Telehealth: Payer: Self-pay | Admitting: Family Medicine

## 2022-11-03 DIAGNOSIS — Z794 Long term (current) use of insulin: Secondary | ICD-10-CM

## 2022-11-03 MED ORDER — NOVOLOG FLEXPEN 100 UNIT/ML ~~LOC~~ SOPN: PEN_INJECTOR | SUBCUTANEOUS | 99 refills | Status: DC

## 2022-11-03 NOTE — Telephone Encounter (Signed)
done

## 2022-11-03 NOTE — Telephone Encounter (Signed)
Patient notified

## 2022-11-03 NOTE — Telephone Encounter (Signed)
  Prescription Request  11/03/2022  Is this a "Controlled Substance" medicine? no  Have you seen your PCP in the last 2 weeks? No pt has appt with DR. Darnell Level. 01/25/23  If YES, route message to pool  -  If NO, patient needs to be scheduled for appointment.  What is the name of the medication or equipment? insulin aspart (NOVOLOG FLEXPEN) 100 UNIT/ML FlexPen   Have you contacted your pharmacy to request a refill? yes   Which pharmacy would you like this sent to? [facility]   Patient notified that their request is being sent to the clinical staff for review and that they should receive a response within 2 business days.

## 2022-11-03 NOTE — Telephone Encounter (Signed)
Patient's last appointment was 07/16/22, please advise.

## 2022-11-10 ENCOUNTER — Other Ambulatory Visit: Payer: Self-pay | Admitting: Family Medicine

## 2022-11-10 DIAGNOSIS — N183 Chronic kidney disease, stage 3 unspecified: Secondary | ICD-10-CM

## 2022-11-10 DIAGNOSIS — F5101 Primary insomnia: Secondary | ICD-10-CM

## 2022-11-10 MED ORDER — LANTUS SOLOSTAR 100 UNIT/ML ~~LOC~~ SOPN: 26.0000 [IU] | PEN_INJECTOR | SUBCUTANEOUS | 99 refills | Status: DC

## 2022-11-10 MED ORDER — TRAZODONE HCL 100 MG PO TABS: 100.0000 mg | ORAL_TABLET | Freq: Every day | ORAL | 99 refills | Status: DC

## 2022-11-10 NOTE — Telephone Encounter (Signed)
Pt aware refills sent to [facility]

## 2022-11-10 NOTE — Telephone Encounter (Signed)
  Prescription Request  11/10/2022  Is this a "Controlled Substance" medicine? no  Have you seen your PCP in the last 2 weeks? Appt for 6 month 4/2  If YES, route message to pool  -  If NO, patient needs to be scheduled for appointment.  What is the name of the medication or equipment?  insulin glargine (LANTUS SOLOSTAR) 100 UNIT/ML Solostar Pen   traZODone (DESYREL) 100 MG tablet    Have you contacted your pharmacy to request a refill? yes   Which pharmacy would you like this sent to?  [facility] - [place], [address]      Patient notified that their request is being sent to the clinical staff for review and that they should receive a response within 2 business days.

## 2022-11-12 DIAGNOSIS — L439 Lichen planus, unspecified: Secondary | ICD-10-CM | POA: Diagnosis not present

## 2022-11-12 DIAGNOSIS — D696 Thrombocytopenia, unspecified: Secondary | ICD-10-CM | POA: Diagnosis not present

## 2022-11-12 DIAGNOSIS — C50511 Malignant neoplasm of lower-outer quadrant of right female breast: Secondary | ICD-10-CM | POA: Diagnosis not present

## 2022-11-12 DIAGNOSIS — E1140 Type 2 diabetes mellitus with diabetic neuropathy, unspecified: Secondary | ICD-10-CM | POA: Diagnosis not present

## 2022-11-12 DIAGNOSIS — Z170 Estrogen receptor positive status [ER+]: Secondary | ICD-10-CM | POA: Diagnosis not present

## 2022-11-18 DIAGNOSIS — L300 Nummular dermatitis: Secondary | ICD-10-CM | POA: Diagnosis not present

## 2022-11-18 DIAGNOSIS — L900 Lichen sclerosus et atrophicus: Secondary | ICD-10-CM | POA: Diagnosis not present

## 2022-11-18 DIAGNOSIS — L0100 Impetigo, unspecified: Secondary | ICD-10-CM | POA: Diagnosis not present

## 2022-11-29 ENCOUNTER — Encounter (INDEPENDENT_AMBULATORY_CARE_PROVIDER_SITE_OTHER): Payer: Medicare Other | Admitting: Ophthalmology

## 2022-12-01 ENCOUNTER — Encounter (INDEPENDENT_AMBULATORY_CARE_PROVIDER_SITE_OTHER): Payer: Medicare Other | Admitting: Ophthalmology

## 2022-12-01 DIAGNOSIS — E113391 Type 2 diabetes mellitus with moderate nonproliferative diabetic retinopathy without macular edema, right eye: Secondary | ICD-10-CM | POA: Diagnosis not present

## 2022-12-01 DIAGNOSIS — E113312 Type 2 diabetes mellitus with moderate nonproliferative diabetic retinopathy with macular edema, left eye: Secondary | ICD-10-CM

## 2022-12-01 DIAGNOSIS — H43813 Vitreous degeneration, bilateral: Secondary | ICD-10-CM

## 2022-12-01 DIAGNOSIS — H35033 Hypertensive retinopathy, bilateral: Secondary | ICD-10-CM | POA: Diagnosis not present

## 2022-12-01 DIAGNOSIS — I10 Essential (primary) hypertension: Secondary | ICD-10-CM

## 2022-12-12 DIAGNOSIS — E1140 Type 2 diabetes mellitus with diabetic neuropathy, unspecified: Secondary | ICD-10-CM | POA: Diagnosis not present

## 2022-12-28 DIAGNOSIS — B351 Tinea unguium: Secondary | ICD-10-CM | POA: Diagnosis not present

## 2022-12-28 DIAGNOSIS — L84 Corns and callosities: Secondary | ICD-10-CM | POA: Diagnosis not present

## 2022-12-28 DIAGNOSIS — E1142 Type 2 diabetes mellitus with diabetic polyneuropathy: Secondary | ICD-10-CM | POA: Diagnosis not present

## 2022-12-28 DIAGNOSIS — M79673 Pain in unspecified foot: Secondary | ICD-10-CM | POA: Diagnosis not present

## 2023-01-11 DIAGNOSIS — E1140 Type 2 diabetes mellitus with diabetic neuropathy, unspecified: Secondary | ICD-10-CM | POA: Diagnosis not present

## 2023-01-19 ENCOUNTER — Encounter (INDEPENDENT_AMBULATORY_CARE_PROVIDER_SITE_OTHER): Payer: Medicare Other | Admitting: Ophthalmology

## 2023-01-19 DIAGNOSIS — H35033 Hypertensive retinopathy, bilateral: Secondary | ICD-10-CM

## 2023-01-19 DIAGNOSIS — I10 Essential (primary) hypertension: Secondary | ICD-10-CM

## 2023-01-19 DIAGNOSIS — H43813 Vitreous degeneration, bilateral: Secondary | ICD-10-CM | POA: Diagnosis not present

## 2023-01-19 DIAGNOSIS — E113391 Type 2 diabetes mellitus with moderate nonproliferative diabetic retinopathy without macular edema, right eye: Secondary | ICD-10-CM | POA: Diagnosis not present

## 2023-01-19 DIAGNOSIS — E113312 Type 2 diabetes mellitus with moderate nonproliferative diabetic retinopathy with macular edema, left eye: Secondary | ICD-10-CM | POA: Diagnosis not present

## 2023-01-25 ENCOUNTER — Ambulatory Visit (INDEPENDENT_AMBULATORY_CARE_PROVIDER_SITE_OTHER): Payer: Medicare PPO | Admitting: Family Medicine

## 2023-01-25 ENCOUNTER — Encounter: Payer: Self-pay | Admitting: Family Medicine

## 2023-01-25 VITALS — BP 122/72 | HR 74 | Temp 98.6°F | Ht 63.0 in | Wt 142.0 lb

## 2023-01-25 DIAGNOSIS — L439 Lichen planus, unspecified: Secondary | ICD-10-CM

## 2023-01-25 DIAGNOSIS — E1169 Type 2 diabetes mellitus with other specified complication: Secondary | ICD-10-CM

## 2023-01-25 DIAGNOSIS — E11319 Type 2 diabetes mellitus with unspecified diabetic retinopathy without macular edema: Secondary | ICD-10-CM

## 2023-01-25 DIAGNOSIS — E1122 Type 2 diabetes mellitus with diabetic chronic kidney disease: Secondary | ICD-10-CM | POA: Diagnosis not present

## 2023-01-25 DIAGNOSIS — C50511 Malignant neoplasm of lower-outer quadrant of right female breast: Secondary | ICD-10-CM

## 2023-01-25 DIAGNOSIS — E785 Hyperlipidemia, unspecified: Secondary | ICD-10-CM

## 2023-01-25 DIAGNOSIS — I152 Hypertension secondary to endocrine disorders: Secondary | ICD-10-CM | POA: Diagnosis not present

## 2023-01-25 DIAGNOSIS — E1142 Type 2 diabetes mellitus with diabetic polyneuropathy: Secondary | ICD-10-CM

## 2023-01-25 DIAGNOSIS — E119 Type 2 diabetes mellitus without complications: Secondary | ICD-10-CM | POA: Diagnosis not present

## 2023-01-25 DIAGNOSIS — E1159 Type 2 diabetes mellitus with other circulatory complications: Secondary | ICD-10-CM | POA: Diagnosis not present

## 2023-01-25 DIAGNOSIS — Z794 Long term (current) use of insulin: Secondary | ICD-10-CM | POA: Diagnosis not present

## 2023-01-25 DIAGNOSIS — N1832 Chronic kidney disease, stage 3b: Secondary | ICD-10-CM

## 2023-01-25 DIAGNOSIS — Z170 Estrogen receptor positive status [ER+]: Secondary | ICD-10-CM

## 2023-01-25 LAB — BAYER DCA HB A1C WAIVED: HB A1C (BAYER DCA - WAIVED): 6.5 % — ABNORMAL HIGH (ref 4.8–5.6)

## 2023-01-25 NOTE — Progress Notes (Signed)
Subjective: CC:DM PCP: [name], DO HY:[id number] [name] is a 82 y.o. female presenting to clinic today for:  1. Type 2 Diabetes with hypertension, hyperlipidemia:  Sugar continues to be managed by endocrinology and she has a visit in the next few weeks.  She notes that she is compliant with the freestyle libre monitor and that she has highs into the 230s but has had a couple of lows in the 60s.  Currently injecting 14 units of Lantus daily and anywhere between 4 and 6 units of NovoLog per sliding scale recommendations.  She is compliant with Norvasc, Lipitor  Last eye exam: Up-to-date Last foot exam: Needs Last A1c:  Lab Results  Component Value Date   HGBA1C 6.8 (H) 07/28/2022   Nephropathy screen indicated?:  Needs Last flu, zoster and/or pneumovax:  Immunization History  Administered Date(s) Administered   Fluad Quad(high Dose 65+) 07/09/2019, 09/08/2020, 07/28/2022   Influenza, High Dose Seasonal PF 07/19/2016, 10/13/2017, 08/28/2018   Influenza,inj,Quad PF,6+ Mos 07/30/2013, 07/24/2014, 08/04/2015   Moderna SARS-COV2 Booster Vaccination 09/13/2020   Moderna Sars-Covid-2 Vaccination 11/29/2019, 12/31/2019   Pneumococcal Conjugate-13 11/27/2014   Pneumococcal Polysaccharide-23 10/22/2009   Td 10/25/2004   Tdap 11/27/2014   Zoster Recombinat (Shingrix) 08/19/2021, 10/30/2021   Zoster, Live 04/06/2012    ROS: Denies chest pain, shortness of breath, falls.  She does report polymyalgia and arthralgia and this seems to be related to recent switch of her breast cancer pill.  She has an appointment soon to discuss this further and is considering coming off of this.  She notes that overall she is not only more achy but she is also had worsening lichen planus of the vaginal area since being put on an estrogen blocker and being taken off of topical estrogen.  This is refractory to topical steroids and lubricants.  She feels that quality of life overall is not as good as it once  was    ROS: Per HPI  Allergies  Allergen Reactions   Latex Anaphylaxis and Rash   Ambien [Zolpidem] Other (See Comments)    Hallucinations   Augmentin [Amoxicillin-Pot Clavulanate] Nausea And Vomiting   Belsomra [Suvorexant] Other (See Comments)    Hallucinations   Hydrocodone-Acetaminophen Other (See Comments)    hallucinations   Hyoscyamine Sulfate Other (See Comments)   Melatonin Other (See Comments)    Adverse reaction-"keeps me awake"   Mobic [Meloxicam]    Morphine Nausea And Vomiting    REACTION: hallucinations,GI upset   Oxycodone Other (See Comments)    Hallucinations   Tramadol     Unknown reaction   Celebrex [Celecoxib] Itching and Rash   Sulfa Antibiotics Rash   Past Medical History:  Diagnosis Date   Acute cystitis    Anxiety    Breast cancer detected by national screening programme 03/12/2022   Depression    Diabetes mellitus type II    Diabetic retinopathy    DM type 2 causing CKD stage 3    Fibromyalgia    GERD (gastroesophageal reflux disease)    Hyperlipidemia    Hypertension    IBS (irritable bowel syndrome)    IBS (irritable bowel syndrome)    Insomnia    Lichen planus    vulvar   Pancreatitis, gallstone    Postmenopausal    Radial scar of breast 06/04/2016   Urge incontinence of urine     Current Outpatient Medications:    acetaminophen (TYLENOL) 325 MG tablet, Take 325 mg by mouth as needed., Disp: ,  Rfl:    amLODipine (NORVASC) 5 MG tablet, Take 1 tablet (5 mg total) by mouth daily., Disp: 90 tablet, Rfl: 3   aspirin 81 MG tablet, Take 81 mg by mouth every other day., Disp: , Rfl:    atorvastatin (LIPITOR) 10 MG tablet, Take 1 tablet (10 mg total) by mouth at bedtime., Disp: 90 tablet, Rfl: 3   BESIVANCE 0.6 % SUSP, Apply to eye., Disp: , Rfl:    Calcium Carb-Cholecalciferol 600-200 MG-UNIT TABS, Take 1 tablet by mouth 2 (two) times daily., Disp: , Rfl:    desonide (DESOWEN) 0.05 % cream, Apply topically 2 (two) times daily., Disp: 30  g, Rfl: 1   diclofenac Sodium (VOLTAREN) 1 % GEL, Apply 4 g topically 4 (four) times daily as needed (arthritis)., Disp: 400 g, Rfl: prn   Dulaglutide (TRULICITY) 1.5 0000000 SOPN, Inject 1.5 mg into the skin once a week., Disp: 7.5 mL, Rfl: 3   escitalopram (LEXAPRO) 20 MG tablet, Take 1 tablet (20 mg total) by mouth daily., Disp: 90 tablet, Rfl: 3   esomeprazole (NEXIUM) 20 MG capsule, Take 1 capsule (20 mg total) by mouth daily at 12 noon., Disp: 90 capsule, Rfl: 3   fluticasone (FLONASE) 50 MCG/ACT nasal spray, Place 2 sprays into both nostrils daily., Disp: 16 g, Rfl: 6   glucose blood (FREESTYLE LITE) test strip, Use qid to check blood sugar. Dx E11.9, Disp: 400 each, Rfl: 2   insulin aspart (NOVOLOG FLEXPEN) 100 UNIT/ML FlexPen, BG less than 120 - no insulin; 121 to 150 - 3 units; 151 to 200 - 4 units; 201 to 250 - 5 units; 251 to 300 - 6 units; 301-350 - 7 units, 351 or above 8units, Disp: 60 mL, Rfl: PRN   insulin glargine (LANTUS SOLOSTAR) 100 UNIT/ML Solostar Pen, Inject 26 Units into the skin every morning., Disp: 45 mL, Rfl: PRN   Insulin Pen Needle 31G X 5 MM MISC, 1 Device by Does not apply route 4 (four) times daily., Disp: 300 each, Rfl: 4   Multiple Vitamin (MULTIVITAMIN) tablet, Take 1 tablet by mouth daily., Disp: , Rfl:    nystatin powder, APPLY UNDER BILATERAL BREASTS AND GROIN TWICE A DAY (If needed), Disp: 60 g, Rfl: 1   nystatin-triamcinolone ointment (MYCOLOG), APPLY TOPICALLY 2 (TWO) TIMES DAILY IF NEEDED FOR rash.  NOT A PREVENTATIVE MEDICATION., Disp: 60 g, Rfl: 3   Polyethyl Glycol-Propyl Glycol 0.4-0.3 % SOLN, Apply 1 drop to eye daily as needed (for dry eye relief). , Disp: , Rfl:    polyethylene glycol (MIRALAX / GLYCOLAX) packet, Take 17 g by mouth daily as needed. , Disp: , Rfl:    Probiotic Product (PROBIOTIC ADVANCED PO), Take 1 capsule by mouth daily. , Disp: , Rfl:    traZODone (DESYREL) 100 MG tablet, Take 1 tablet (100 mg total) by mouth at bedtime., Disp: 90  tablet, Rfl: PRN   triamcinolone cream (KENALOG) 0.1 %, Apply 1 Application topically 2 (two) times daily. X7-14 days for rash., Disp: 30 g, Rfl: 0 Social History   Socioeconomic History   Marital status: Widowed    Spouse name: Not on file   Number of children: 3   Years of education: 7   Highest education level: Some college, no degree  Occupational History   Occupation: retired    Fish farm manager: RETIRED    Comment: bookeeping  Tobacco Use   Smoking status: Never   Smokeless tobacco: Never  Vaping Use   Vaping Use: Never used  Substance and Sexual Activity   Alcohol use: No   Drug use: No   Sexual activity: Not Currently  Other Topics Concern   Not on file  Social History Narrative   Widowed in 2010; lives alone. Children live out of town   Social Determinants of Health   Financial Resource Strain: [place]  (01/24/2023)   Overall Financial Resource Strain (CARDIA)    Difficulty of Paying Living Expenses: Not hard at all  Food Insecurity: No Food Insecurity (01/24/2023)   Hunger Vital Sign    Worried About Running Out of Food in the Last Year: Never true    Ran Out of Food in the Last Year: Never true  Transportation Needs: Unmet Transportation Needs (01/24/2023)   PRAPARE - Hydrologist (Medical): Yes    Lack of Transportation (Non-Medical): No  Physical Activity: Insufficiently Active (01/24/2023)   Exercise Vital Sign    Days of Exercise per Week: 3 days    Minutes of Exercise per Session: 20 min  Stress: No Stress Concern Present (01/24/2023)   [place]    Feeling of Stress : Only a little  Social Connections: Moderately Isolated (01/24/2023)   Social Connection and Isolation Panel [NHANES]    Frequency of Communication with Friends and Family: More than three times a week    Frequency of Social Gatherings with Friends and Family: Three times a week    Attends Religious Services:  1 to 4 times per year    Active Member of Clubs or Organizations: No    Attends Archivist Meetings: Never    Marital Status: Widowed  Intimate Partner Violence: Not At Risk (04/14/2022)   Humiliation, Afraid, Rape, and Kick questionnaire    Fear of Current or Ex-Partner: No    Emotionally Abused: No    Physically Abused: No    Sexually Abused: No   Family History  Problem Relation Age of Onset   Diabetes Mother    Hypertension Mother    Kidney disease Mother    Heart disease Mother    Peripheral vascular disease Mother        amputation   Cancer Father        prostate   Diabetes Father    Heart disease Father        CABG   Hypertension Father    Heart attack Father    Depression Sister    Hypertension Sister    Neuropathy Sister     Objective: Office vital signs reviewed. BP 122/72   Pulse 74   Temp 98.6 F (37 [name])   Ht 5\' 3"  (1.6 m)   Wt 142 lb (64.4 kg)   SpO2 98%   BMI 25.15 kg/m   Physical Examination:  General: Awake, alert, well nourished, No acute distress HEENT: sclera white, MMM Cardio: regular rate and rhythm, S1S2 heard, no murmurs appreciated Pulm: clear to auscultation bilaterally, no wheezes, rhonchi or rales; normal work of breathing on room air Neuro: See diabetic foot exam below Diabetic Foot Exam - Simple   Simple Foot Form Diabetic Foot exam was performed with the following findings: Yes 01/25/2023  1:06 PM  Visual Inspection No deformities, no ulcerations, no other skin breakdown bilaterally: Yes Sensation Testing Intact to touch and monofilament testing bilaterally: Yes Pulse Check Posterior Tibialis and Dorsalis pulse intact bilaterally: Yes Comments     Assessment/ Plan: 82 y.o. female   Type 2 diabetes  mellitus with stage 3b chronic kidney disease, with long-term current use of insulin - Plan: Bayer DCA Hb A1c Waived, Microalbumin / creatinine urine ratio, CBC with Differential, VITAMIN D 25 Hydroxy (Vit-D Deficiency,  Fractures)  Diabetic polyneuropathy associated with type 2 diabetes mellitus  Hypertension associated with diabetes  Hyperlipidemia associated with type 2 diabetes mellitus  Diabetic retinopathy without macular edema associated with type 2 diabetes mellitus, unspecified laterality, unspecified retinopathy severity  Lichen planus  Malignant neoplasm of lower-outer quadrant of right breast of female, estrogen receptor positive  Sugar remains well-controlled but I do have some concerns about this intermittent hypoglycemia.  She has upcoming appoint with endocrinology so I will fax this chart to their office.  Will perform diabetic foot exam today and urine microalbumin.  Check vitamin D level, CBC given CKD 3B  Blood pressure is controlled.  No changes.  Continue statin.  Continue to follow-up with ophthalmology for known diabetic retinopathy  Having overall decreased quality of life after being placed on estrogen blocker.  I discussed with her that they are of course are risks of coming off of this medication, specifically with recurrence of breast cancer.  I will CC her chart to her oncologist, whom she will be having an appointment with soon to discuss pros and cons of continued therapy.  Orders Placed This Encounter  Procedures   Bayer DCA Hb A1c Waived   Microalbumin / creatinine urine ratio   CBC with Differential   No orders of the defined types were placed in this encounter.    [name], DO [place] [phone]

## 2023-01-26 ENCOUNTER — Telehealth: Payer: Self-pay

## 2023-01-26 LAB — CBC WITH DIFFERENTIAL/PLATELET
Basophils Absolute: 0 10*3/uL (ref 0.0–0.2)
Basos: 1 %
EOS (ABSOLUTE): 0.1 10*3/uL (ref 0.0–0.4)
Eos: 2 %
Hematocrit: 40.5 % (ref 34.0–46.6)
Hemoglobin: 13.8 g/dL (ref 11.1–15.9)
Immature Grans (Abs): 0 10*3/uL (ref 0.0–0.1)
Immature Granulocytes: 0 %
Lymphocytes Absolute: 1 10*3/uL (ref 0.7–3.1)
Lymphs: 22 %
MCH: 32.2 pg (ref 26.6–33.0)
MCHC: 34.1 g/dL (ref 31.5–35.7)
MCV: 94 fL (ref 79–97)
Monocytes Absolute: 0.4 10*3/uL (ref 0.1–0.9)
Monocytes: 8 %
Neutrophils Absolute: 3.1 10*3/uL (ref 1.4–7.0)
Neutrophils: 67 %
Platelets: 144 10*3/uL — ABNORMAL LOW (ref 150–450)
RBC: 4.29 x10E6/uL (ref 3.77–5.28)
RDW: 12.1 % (ref 11.7–15.4)
WBC: 4.6 10*3/uL (ref 3.4–10.8)

## 2023-01-26 LAB — MICROALBUMIN / CREATININE URINE RATIO
Creatinine, Urine: 120.4 mg/dL
Microalb/Creat Ratio: 30 mg/g creat — ABNORMAL HIGH (ref 0–29)
Microalbumin, Urine: 35.6 ug/mL

## 2023-01-26 LAB — VITAMIN D 25 HYDROXY (VIT D DEFICIENCY, FRACTURES): Vit D, 25-Hydroxy: 56.7 ng/mL (ref 30.0–100.0)

## 2023-01-26 NOTE — Telephone Encounter (Signed)
I need to know how many times she is going during the night, if she is having burning or blood in urine, if she feels any protrusions from her vagina to suggest cystocele, any pelvic pain.  Is she drinking caffeine/ sugar?  If so she needs to discontinue.

## 2023-01-26 NOTE — Telephone Encounter (Signed)
Patient forgot to mention at her appointment that she is having issues with incontinence and more episodes of waking up at night having to urinate.  She said she is noticing the incontinence happening daytime and nighttime and is interested in starting a medication to see if it will help.  If you agree, she would like a prescription sent to [facility] in [place].

## 2023-01-27 NOTE — Telephone Encounter (Signed)
Called patient she states she is going 3-4 times during the night. No blood, burning, protrusions or pelvic pain. Patient also states she does not drink caffeine or sugar.

## 2023-01-31 NOTE — Telephone Encounter (Signed)
We can start something IF she decides to come of breast cancer treatment.  At our last visit, she was going to have a discussion re: breast cancer treatment with her oncologist.  The meds to treat overactive bladder interact with her cancer medication and can REDUCE the effectiveness, so if we start meds it would have to be after she has completed her treatment or if she decides to discontinue it all together.

## 2023-01-31 NOTE — Telephone Encounter (Signed)
Pt aware of provider feedback and voiced understanding. She states she is seeing her oncologist tomorrow and would get back to [place] if she decides to stop treatment.

## 2023-02-01 DIAGNOSIS — N3281 Overactive bladder: Secondary | ICD-10-CM | POA: Diagnosis not present

## 2023-02-01 DIAGNOSIS — Z780 Asymptomatic menopausal state: Secondary | ICD-10-CM | POA: Diagnosis not present

## 2023-02-01 DIAGNOSIS — C50511 Malignant neoplasm of lower-outer quadrant of right female breast: Secondary | ICD-10-CM | POA: Diagnosis not present

## 2023-02-01 DIAGNOSIS — Z170 Estrogen receptor positive status [ER+]: Secondary | ICD-10-CM | POA: Diagnosis not present

## 2023-02-01 DIAGNOSIS — M2550 Pain in unspecified joint: Secondary | ICD-10-CM | POA: Diagnosis not present

## 2023-02-01 DIAGNOSIS — L439 Lichen planus, unspecified: Secondary | ICD-10-CM | POA: Diagnosis not present

## 2023-02-01 DIAGNOSIS — D696 Thrombocytopenia, unspecified: Secondary | ICD-10-CM | POA: Diagnosis not present

## 2023-02-01 DIAGNOSIS — Z5181 Encounter for therapeutic drug level monitoring: Secondary | ICD-10-CM | POA: Diagnosis not present

## 2023-02-01 DIAGNOSIS — Z9189 Other specified personal risk factors, not elsewhere classified: Secondary | ICD-10-CM | POA: Diagnosis not present

## 2023-02-10 ENCOUNTER — Telehealth: Payer: Self-pay | Admitting: Family Medicine

## 2023-02-10 DIAGNOSIS — Z794 Long term (current) use of insulin: Secondary | ICD-10-CM | POA: Diagnosis not present

## 2023-02-10 DIAGNOSIS — E118 Type 2 diabetes mellitus with unspecified complications: Secondary | ICD-10-CM | POA: Diagnosis not present

## 2023-02-10 DIAGNOSIS — E1140 Type 2 diabetes mellitus with diabetic neuropathy, unspecified: Secondary | ICD-10-CM | POA: Diagnosis not present

## 2023-02-10 NOTE — Telephone Encounter (Signed)
  Prescription Request  02/10/2023  Is this a "Controlled Substance" medicine? no  Have you seen your PCP in the last 2 weeks? 4/2  If YES, route message to pool  -  If NO, patient needs to be scheduled for appointment.  What is the name of the medication or equipment?  Dulaglutide (TRULICITY) .75  Have you contacted your pharmacy to request a refill? yes   Which pharmacy would you like this sent to?  [facility] - [place], [place] - [address]     Patient notified that their request is being sent to the clinical staff for review and that they should receive a response within 2 business days.

## 2023-02-10 NOTE — Telephone Encounter (Signed)
LMOVM for pt to contact her Endocrinologist since they manage her Diabetes and her PCP has not filled/refilled her Trulicity since 2022.

## 2023-02-18 DIAGNOSIS — L439 Lichen planus, unspecified: Secondary | ICD-10-CM | POA: Diagnosis not present

## 2023-02-18 DIAGNOSIS — Z79811 Long term (current) use of aromatase inhibitors: Secondary | ICD-10-CM | POA: Diagnosis not present

## 2023-02-18 DIAGNOSIS — Z5181 Encounter for therapeutic drug level monitoring: Secondary | ICD-10-CM | POA: Diagnosis not present

## 2023-02-18 DIAGNOSIS — Z170 Estrogen receptor positive status [ER+]: Secondary | ICD-10-CM | POA: Diagnosis not present

## 2023-02-18 DIAGNOSIS — D696 Thrombocytopenia, unspecified: Secondary | ICD-10-CM | POA: Diagnosis not present

## 2023-02-18 DIAGNOSIS — C50511 Malignant neoplasm of lower-outer quadrant of right female breast: Secondary | ICD-10-CM | POA: Diagnosis not present

## 2023-02-21 DIAGNOSIS — E1140 Type 2 diabetes mellitus with diabetic neuropathy, unspecified: Secondary | ICD-10-CM | POA: Diagnosis not present

## 2023-02-21 DIAGNOSIS — R928 Other abnormal and inconclusive findings on diagnostic imaging of breast: Secondary | ICD-10-CM | POA: Diagnosis not present

## 2023-02-21 DIAGNOSIS — R92333 Mammographic heterogeneous density, bilateral breasts: Secondary | ICD-10-CM | POA: Diagnosis not present

## 2023-02-21 DIAGNOSIS — N1831 Chronic kidney disease, stage 3a: Secondary | ICD-10-CM | POA: Diagnosis not present

## 2023-02-21 DIAGNOSIS — Z853 Personal history of malignant neoplasm of breast: Secondary | ICD-10-CM | POA: Diagnosis not present

## 2023-02-21 DIAGNOSIS — Z794 Long term (current) use of insulin: Secondary | ICD-10-CM | POA: Diagnosis not present

## 2023-02-21 DIAGNOSIS — Z08 Encounter for follow-up examination after completed treatment for malignant neoplasm: Secondary | ICD-10-CM | POA: Diagnosis not present

## 2023-02-28 ENCOUNTER — Telehealth: Payer: Self-pay | Admitting: Family Medicine

## 2023-02-28 DIAGNOSIS — E1122 Type 2 diabetes mellitus with diabetic chronic kidney disease: Secondary | ICD-10-CM

## 2023-02-28 NOTE — Telephone Encounter (Signed)
  Prescription Request  02/28/2023  Is this a "Controlled Substance" medicine? insulin aspart (NOVOLOG FLEXPEN) 100 UNIT/ML FlexPen  insulin glargine (LANTUS SOLOSTAR) 100 UNIT/ML Solostar Pen   Have you seen your PCP in the last 2 weeks? 01/25/2023 upcoming 07/27/2023  If YES, route message to pool  -  If NO, patient needs to be scheduled for appointment.  What is the name of the medication or equipment? insulin glargine (LANTUS SOLOSTAR) 100 UNIT/ML Solostar Pen   Have you contacted your pharmacy to request a refill? no   Which pharmacy would you like this sent to? [facility]   Patient notified that their request is being sent to the clinical staff for review and that they should receive a response within 2 business days.

## 2023-02-28 NOTE — Telephone Encounter (Signed)
Patient called back and stated she did not need at this time so only needed the pen needles and found a box.

## 2023-03-08 DIAGNOSIS — E1142 Type 2 diabetes mellitus with diabetic polyneuropathy: Secondary | ICD-10-CM | POA: Diagnosis not present

## 2023-03-08 DIAGNOSIS — M79676 Pain in unspecified toe(s): Secondary | ICD-10-CM | POA: Diagnosis not present

## 2023-03-08 DIAGNOSIS — L84 Corns and callosities: Secondary | ICD-10-CM | POA: Diagnosis not present

## 2023-03-08 DIAGNOSIS — B351 Tinea unguium: Secondary | ICD-10-CM | POA: Diagnosis not present

## 2023-03-09 DIAGNOSIS — L900 Lichen sclerosus et atrophicus: Secondary | ICD-10-CM | POA: Diagnosis not present

## 2023-03-09 DIAGNOSIS — Z01419 Encounter for gynecological examination (general) (routine) without abnormal findings: Secondary | ICD-10-CM | POA: Diagnosis not present

## 2023-03-12 DIAGNOSIS — Z794 Long term (current) use of insulin: Secondary | ICD-10-CM | POA: Diagnosis not present

## 2023-03-12 DIAGNOSIS — E1140 Type 2 diabetes mellitus with diabetic neuropathy, unspecified: Secondary | ICD-10-CM | POA: Diagnosis not present

## 2023-03-12 DIAGNOSIS — E118 Type 2 diabetes mellitus with unspecified complications: Secondary | ICD-10-CM | POA: Diagnosis not present

## 2023-03-16 ENCOUNTER — Encounter (INDEPENDENT_AMBULATORY_CARE_PROVIDER_SITE_OTHER): Payer: Medicare PPO | Admitting: Ophthalmology

## 2023-03-16 DIAGNOSIS — E113393 Type 2 diabetes mellitus with moderate nonproliferative diabetic retinopathy without macular edema, bilateral: Secondary | ICD-10-CM | POA: Diagnosis not present

## 2023-03-16 DIAGNOSIS — H43813 Vitreous degeneration, bilateral: Secondary | ICD-10-CM

## 2023-03-16 DIAGNOSIS — Z7984 Long term (current) use of oral hypoglycemic drugs: Secondary | ICD-10-CM | POA: Diagnosis not present

## 2023-03-16 DIAGNOSIS — H35033 Hypertensive retinopathy, bilateral: Secondary | ICD-10-CM

## 2023-03-16 DIAGNOSIS — I10 Essential (primary) hypertension: Secondary | ICD-10-CM | POA: Diagnosis not present

## 2023-03-22 DIAGNOSIS — R350 Frequency of micturition: Secondary | ICD-10-CM | POA: Diagnosis not present

## 2023-03-22 DIAGNOSIS — N3946 Mixed incontinence: Secondary | ICD-10-CM | POA: Diagnosis not present

## 2023-03-22 DIAGNOSIS — N3020 Other chronic cystitis without hematuria: Secondary | ICD-10-CM | POA: Diagnosis not present

## 2023-03-30 ENCOUNTER — Ambulatory Visit (INDEPENDENT_AMBULATORY_CARE_PROVIDER_SITE_OTHER): Payer: Medicare PPO | Admitting: Family Medicine

## 2023-03-30 ENCOUNTER — Ambulatory Visit: Payer: Medicare PPO | Admitting: Nurse Practitioner

## 2023-03-30 ENCOUNTER — Encounter: Payer: Self-pay | Admitting: Family Medicine

## 2023-03-30 VITALS — BP 135/61 | HR 74 | Temp 97.8°F | Ht 63.0 in | Wt 139.8 lb

## 2023-03-30 DIAGNOSIS — Z794 Long term (current) use of insulin: Secondary | ICD-10-CM

## 2023-03-30 DIAGNOSIS — E1165 Type 2 diabetes mellitus with hyperglycemia: Secondary | ICD-10-CM

## 2023-03-30 DIAGNOSIS — R7309 Other abnormal glucose: Secondary | ICD-10-CM | POA: Diagnosis not present

## 2023-03-30 NOTE — Progress Notes (Signed)
Subjective:  Patient ID: [name], female    DOB: 1940/12/12  Age: 82 y.o. MRN: [id number]  CC: Diabetes   HPI [name] presents forFollow-up of diabetes. Concerned for high readings. Concerned it is caused by Exemestane. Started April 19. Blood glucose higher ever since. Insulin was increased from 16 units to 20. Patient checks blood sugar at home.   150-250 fasting and 150-250 postprandial. Some over 300. Goes to Dr. Morrison Old of endocrinology in [place] who told her the medication doesn't cause hyperglycemia.  Patient denies symptoms such as polyuria, polydipsia, excessive hunger, nausea No significant hypoglycemic spells noted. Medications reviewed. Pt. Taking 0.75 of trulicity. Cannot tolerate full dose due to stomach issues  History [name] has a past medical history of Acute cystitis, Anxiety, Breast cancer detected by national screening programme [facility]) (03/12/2022), Depression, Diabetes mellitus type II, Diabetic retinopathy, DM type 2 causing CKD stage 3 (HCC), Fibromyalgia, GERD (gastroesophageal reflux disease), Hyperlipidemia, Hypertension, IBS (irritable bowel syndrome), IBS (irritable bowel syndrome), Insomnia, Lichen planus, Pancreatitis, gallstone, Postmenopausal, Radial scar of breast (06/04/2016), and Urge incontinence of urine.   She has a past surgical history that includes Cataract extraction w/ intraocular lens implant (09/2003, 12/2003); Cholecystectomy (2004); Tubal ligation (1970); Ankle surgery (02/2004); Total knee arthroplasty; Eye surgery; Trigger finger release (Left, 01/30/2002); Joint replacement; and Breast lumpectomy with radioactive seed localization (Left, 01/22/2016).   Her family history includes Cancer in her father; Depression in her sister; Diabetes in her father and mother; Heart attack in her father; Heart disease in her father and mother; Hypertension in her father, mother, and sister; Kidney disease in her mother; Neuropathy in her sister;  Peripheral vascular disease in her mother.She reports that she has never smoked. She has never used smokeless tobacco. She reports that she does not drink alcohol and does not use drugs.  Current Outpatient Medications on File Prior to Visit  Medication Sig Dispense Refill   acetaminophen (TYLENOL) 325 MG tablet Take 325 mg by mouth as needed.     amLODipine (NORVASC) 5 MG tablet Take 1 tablet (5 mg total) by mouth daily. 90 tablet 3   aspirin 81 MG tablet Take 81 mg by mouth every other day.     atorvastatin (LIPITOR) 10 MG tablet Take 1 tablet (10 mg total) by mouth at bedtime. 90 tablet 3   BESIVANCE 0.6 % SUSP Apply to eye.     Calcium Carb-Cholecalciferol 600-200 MG-UNIT TABS Take 1 tablet by mouth 2 (two) times daily.     clobetasol (TEMOVATE) 0.05 % GEL Apply topically daily.     desonide (DESOWEN) 0.05 % cream Apply topically 2 (two) times daily. 30 g 1   diclofenac Sodium (VOLTAREN) 1 % GEL Apply 4 g topically 4 (four) times daily as needed (arthritis). 400 g prn   Dulaglutide (TRULICITY) 1.5 MG/0.5ML SOPN Inject 1.5 mg into the skin once a week. 7.5 mL 3   escitalopram (LEXAPRO) 20 MG tablet Take 1 tablet (20 mg total) by mouth daily. 90 tablet 3   esomeprazole (NEXIUM) 20 MG capsule Take 1 capsule (20 mg total) by mouth daily at 12 noon. 90 capsule 3   exemestane (AROMASIN) 25 MG tablet Take 25 mg by mouth daily after breakfast.     fluticasone (FLONASE) 50 MCG/ACT nasal spray Place 2 sprays into both nostrils daily. 16 g 6   glucose blood (FREESTYLE LITE) test strip Use qid to check blood sugar. Dx E11.9 400 each 2   insulin aspart (NOVOLOG FLEXPEN)  100 UNIT/ML FlexPen BG less than 120 - no insulin; 121 to 150 - 3 units; 151 to 200 - 4 units; 201 to 250 - 5 units; 251 to 300 - 6 units; 301-350 - 7 units, 351 or above 8units 60 mL PRN   insulin glargine (LANTUS SOLOSTAR) 100 UNIT/ML Solostar Pen Inject 26 Units into the skin every morning. 45 mL PRN   Insulin Pen Needle 31G X 5 MM  MISC 1 Device by Does not apply route 4 (four) times daily. 300 each 4   Multiple Vitamin (MULTIVITAMIN) tablet Take 1 tablet by mouth daily.     nystatin powder APPLY UNDER BILATERAL BREASTS AND GROIN TWICE A DAY (If needed) 60 g 1   nystatin-triamcinolone ointment (MYCOLOG) APPLY TOPICALLY 2 (TWO) TIMES DAILY IF NEEDED FOR rash.  NOT A PREVENTATIVE MEDICATION. 60 g 3   Polyethyl Glycol-Propyl Glycol 0.4-0.3 % SOLN Apply 1 drop to eye daily as needed (for dry eye relief).      polyethylene glycol (MIRALAX / GLYCOLAX) packet Take 17 g by mouth daily as needed.      Probiotic Product (PROBIOTIC ADVANCED PO) Take 1 capsule by mouth daily.      traZODone (DESYREL) 100 MG tablet Take 1 tablet (100 mg total) by mouth at bedtime. 90 tablet PRN   triamcinolone cream (KENALOG) 0.1 % Apply 1 Application topically 2 (two) times daily. X7-14 days for rash. 30 g 0   No current facility-administered medications on file prior to visit.    ROS Review of Systems  Constitutional: Negative.   HENT: Negative.    Eyes:  Negative for visual disturbance.  Respiratory:  Negative for shortness of breath.   Cardiovascular:  Negative for chest pain.  Gastrointestinal:  Negative for abdominal pain.  Musculoskeletal:  Negative for arthralgias.    Objective:  BP 135/61   Pulse 74   Temp 97.8 F (36.6 C)   Ht 5\' 3"  (1.6 m)   Wt 139 lb 12.8 oz (63.4 kg)   SpO2 98%   BMI 24.76 kg/m   BP Readings from Last 3 Encounters:  03/30/23 135/61  01/25/23 122/72  07/23/22 126/65    Wt Readings from Last 3 Encounters:  03/30/23 139 lb 12.8 oz (63.4 kg)  01/25/23 142 lb (64.4 kg)  07/23/22 140 lb (63.5 kg)     Physical Exam Constitutional:      General: She is not in acute distress.    Appearance: She is well-developed.  Cardiovascular:     Rate and Rhythm: Normal rate and regular rhythm.  Pulmonary:     Breath sounds: Normal breath sounds.  Musculoskeletal:        General: Normal range of motion.   Skin:    General: Skin is warm and dry.  Neurological:     Mental Status: She is alert and oriented to person, place, and time.       Assessment & Plan:   [name] was seen today for diabetes.  Diagnoses and all orders for this visit:  Type 2 diabetes mellitus with hyperglycemia, with long-term current use of insulin (HCC)  Elevated glucose -     Sedimentation rate -     TSH -     Vitamin B12 -     CBC with Differential/Platelet -     CMP14+EGFR -     Prolactin -     Cortisol-am, blood      I am having [name] maintain her aspirin, Calcium Carb-Cholecalciferol, multivitamin, Polyethyl  Glycol-Propyl Glycol, Probiotic Product (PROBIOTIC ADVANCED PO), polyethylene glycol, fluticasone, acetaminophen, desonide, [place], Insulin Pen Needle, Trulicity, diclofenac Sodium, FREESTYLE LITE, nystatin, esomeprazole, escitalopram, atorvastatin, amLODipine, nystatin-triamcinolone ointment, triamcinolone cream, NovoLOG FlexPen, Lantus SoloStar, traZODone, exemestane, and clobetasol.  No orders of the defined types were placed in this encounter.    Follow-up: No follow-ups on file.  Mechele Claude, M.D.

## 2023-03-31 LAB — CMP14+EGFR
ALT: 51 IU/L — ABNORMAL HIGH (ref 0–32)
Albumin: 4.2 g/dL (ref 3.7–4.7)
Alkaline Phosphatase: 45 IU/L (ref 44–121)
BUN/Creatinine Ratio: 23 (ref 12–28)
BUN: 24 mg/dL (ref 8–27)
Globulin, Total: 2.2 g/dL (ref 1.5–4.5)
Glucose: 150 mg/dL — ABNORMAL HIGH (ref 70–99)
Total Protein: 6.4 g/dL (ref 6.0–8.5)
eGFR: 53 mL/min/{1.73_m2} — ABNORMAL LOW (ref >59)

## 2023-03-31 LAB — CBC WITH DIFFERENTIAL/PLATELET
Eos: 1 %
MCHC: 34.8 g/dL (ref 31.5–35.7)
Monocytes Absolute: 0.6 10*3/uL (ref 0.1–0.9)
WBC: 5.6 10*3/uL (ref 3.4–10.8)

## 2023-03-31 LAB — CORTISOL-AM, BLOOD

## 2023-03-31 NOTE — Progress Notes (Signed)
Hello [name],  Your lab result is normal and/or stable.Some minor variations that are not significant are commonly marked abnormal, but do not represent any medical problem for you.  Best regards, Caelen Reierson, M.D.

## 2023-04-04 LAB — CBC WITH DIFFERENTIAL/PLATELET
Basophils Absolute: 0 10*3/uL (ref 0.0–0.2)
Basos: 1 %
EOS (ABSOLUTE): 0 10*3/uL (ref 0.0–0.4)
Hematocrit: 39.6 % (ref 34.0–46.6)
Hemoglobin: 13.8 g/dL (ref 11.1–15.9)
Immature Grans (Abs): 0 10*3/uL (ref 0.0–0.1)
Immature Granulocytes: 0 %
Lymphocytes Absolute: 1 10*3/uL (ref 0.7–3.1)
Lymphs: 17 %
MCH: 32.5 pg (ref 26.6–33.0)
MCV: 93 fL (ref 79–97)
Monocytes: 10 %
Neutrophils Absolute: 4 10*3/uL (ref 1.4–7.0)
Neutrophils: 71 %
Platelets: 152 10*3/uL (ref 150–450)
RBC: 4.24 x10E6/uL (ref 3.77–5.28)
RDW: 12.6 % (ref 11.7–15.4)

## 2023-04-04 LAB — CMP14+EGFR
AST: 47 IU/L — ABNORMAL HIGH (ref 0–40)
Albumin/Globulin Ratio: 1.9 (ref 1.2–2.2)
Bilirubin Total: 0.4 mg/dL (ref 0.0–1.2)
CO2: 28 mmol/L (ref 20–29)
Calcium: 9.9 mg/dL (ref 8.7–10.3)
Chloride: 99 mmol/L (ref 96–106)
Creatinine, Ser: 1.05 mg/dL — ABNORMAL HIGH (ref 0.57–1.00)
Potassium: 4.2 mmol/L (ref 3.5–5.2)
Sodium: 138 mmol/L (ref 134–144)

## 2023-04-04 LAB — SEDIMENTATION RATE: Sed Rate: 2 mm/hr (ref 0–40)

## 2023-04-04 LAB — PROLACTIN: Prolactin: 16.9 ng/mL (ref 3.6–32.0)

## 2023-04-04 LAB — TSH: TSH: 1.11 u[IU]/mL (ref 0.450–4.500)

## 2023-04-04 LAB — VITAMIN B12: Vitamin B-12: 1223 pg/mL (ref 232–1245)

## 2023-04-12 DIAGNOSIS — B3731 Acute candidiasis of vulva and vagina: Secondary | ICD-10-CM | POA: Diagnosis not present

## 2023-04-12 DIAGNOSIS — N898 Other specified noninflammatory disorders of vagina: Secondary | ICD-10-CM | POA: Diagnosis not present

## 2023-04-12 DIAGNOSIS — L900 Lichen sclerosus et atrophicus: Secondary | ICD-10-CM | POA: Diagnosis not present

## 2023-04-27 DIAGNOSIS — E1140 Type 2 diabetes mellitus with diabetic neuropathy, unspecified: Secondary | ICD-10-CM | POA: Diagnosis not present

## 2023-04-29 ENCOUNTER — Ambulatory Visit (INDEPENDENT_AMBULATORY_CARE_PROVIDER_SITE_OTHER): Payer: Medicare PPO

## 2023-04-29 VITALS — Ht 63.0 in | Wt 140.0 lb

## 2023-04-29 DIAGNOSIS — Z0000 Encounter for general adult medical examination without abnormal findings: Secondary | ICD-10-CM

## 2023-04-29 NOTE — Progress Notes (Signed)
Subjective:   [name] is a 82 y.o. female who presents for Medicare Annual (Subsequent) preventive examination.  Visit Complete: Virtual  I connected with  [name] on 04/29/23 by a audio enabled telemedicine application and verified that I am speaking with the correct person using two identifiers.  Patient Location: Home  Provider Location: Home Office  I discussed the limitations of evaluation and management by telemedicine. The patient expressed understanding and agreed to proceed.  Patient Medicare AWV questionnaire was completed by the patient on 04/29/2023; I have confirmed that all information answered by patient is correct and no changes since this date.  Review of Systems    Nutrition Risk Assessment:  Has the patient had any N/V/D within the last 2 months?  No  Does the patient have any non-healing wounds?  No  Has the patient had any unintentional weight loss or weight gain?  No   Diabetes:  Is the patient diabetic?  Yes  If diabetic, was a CBG obtained today?  No  Did the patient bring in their glucometer from home?  No  How often do you monitor your CBG's? libre.   Financial Strains and Diabetes Management:  Are you having any financial strains with the device, your supplies or your medication? No .  Does the patient want to be seen by Chronic Care Management for management of their diabetes?  No  Would the patient like to be referred to a Nutritionist or for Diabetic Management?  No   Diabetic Exams:  Diabetic Eye Exam: Completed 05/2022 Diabetic Foot Exam: Overdue, Pt has been advised about the importance in completing this exam. Pt is scheduled for diabetic foot exam on next office visit .  Cardiac Risk Factors include: advanced age (>24men, >36 women);diabetes mellitus;dyslipidemia;hypertension     Objective:    Today's Vitals   04/29/23 0953  Weight: 140 lb (63.5 kg)  Height: 5\' 3"  (1.6 m)   Body mass index is 24.8 kg/m.      04/29/2023    9:57 AM 04/14/2022    2:10 PM 04/13/2021    2:28 PM 04/10/2020    1:46 PM 08/03/2019   11:29 AM 03/28/2019    1:37 PM 08/02/2018    2:30 PM  Advanced Directives  Does Patient Have a Medical Advance Directive? Yes Yes Yes Yes Yes Yes Yes  Type of Estate agent of [place];Living will Healthcare Power of [place];Living will Healthcare Power of [place];Living will Living will;Out of facility DNR (pink MOST or yellow form);Healthcare Power of [employer] of [place];Living will Healthcare Power of [place];Living will Healthcare Power of [place];Living will  Does patient want to make changes to medical advance directive?    No - Patient declined No - Patient declined Yes (MAU/Ambulatory/Procedural Areas - Information given) No - Patient declined  Copy of Healthcare Power of Attorney in Chart? No - copy requested No - copy requested No - copy requested No - copy requested No - copy requested No - copy requested No - copy requested    Current Medications (verified) Outpatient Encounter Medications as of 04/29/2023  Medication Sig   acetaminophen (TYLENOL) 325 MG tablet Take 325 mg by mouth as needed.   amLODipine (NORVASC) 5 MG tablet Take 1 tablet (5 mg total) by mouth daily.   aspirin 81 MG tablet Take 81 mg by mouth every other day.   atorvastatin (LIPITOR) 10 MG tablet Take 1 tablet (10 mg total) by mouth at bedtime.  BESIVANCE 0.6 % SUSP Apply to eye.   Calcium Carb-Cholecalciferol 600-200 MG-UNIT TABS Take 1 tablet by mouth 2 (two) times daily.   clobetasol (TEMOVATE) 0.05 % GEL Apply topically daily.   desonide (DESOWEN) 0.05 % cream Apply topically 2 (two) times daily.   diclofenac Sodium (VOLTAREN) 1 % GEL Apply 4 g topically 4 (four) times daily as needed (arthritis).   Dulaglutide (TRULICITY) 1.5 MG/0.5ML SOPN Inject 1.5 mg into the skin once a week.   escitalopram (LEXAPRO) 20 MG tablet Take 1 tablet (20 mg total) by mouth daily.    esomeprazole (NEXIUM) 20 MG capsule Take 1 capsule (20 mg total) by mouth daily at 12 noon.   exemestane (AROMASIN) 25 MG tablet Take 25 mg by mouth daily after breakfast.   fluticasone (FLONASE) 50 MCG/ACT nasal spray Place 2 sprays into both nostrils daily.   glucose blood (FREESTYLE LITE) test strip Use qid to check blood sugar. Dx E11.9   insulin aspart (NOVOLOG FLEXPEN) 100 UNIT/ML FlexPen BG less than 120 - no insulin; 121 to 150 - 3 units; 151 to 200 - 4 units; 201 to 250 - 5 units; 251 to 300 - 6 units; 301-350 - 7 units, 351 or above 8units   insulin glargine (LANTUS SOLOSTAR) 100 UNIT/ML Solostar Pen Inject 26 Units into the skin every morning.   Insulin Pen Needle 31G X 5 MM MISC 1 Device by Does not apply route 4 (four) times daily.   Multiple Vitamin (MULTIVITAMIN) tablet Take 1 tablet by mouth daily.   nystatin powder APPLY UNDER BILATERAL BREASTS AND GROIN TWICE A DAY (If needed)   nystatin-triamcinolone ointment (MYCOLOG) APPLY TOPICALLY 2 (TWO) TIMES DAILY IF NEEDED FOR rash.  NOT A PREVENTATIVE MEDICATION.   Polyethyl Glycol-Propyl Glycol 0.4-0.3 % SOLN Apply 1 drop to eye daily as needed (for dry eye relief).    polyethylene glycol (MIRALAX / GLYCOLAX) packet Take 17 g by mouth daily as needed.    Probiotic Product (PROBIOTIC ADVANCED PO) Take 1 capsule by mouth daily.    traZODone (DESYREL) 100 MG tablet Take 1 tablet (100 mg total) by mouth at bedtime.   triamcinolone cream (KENALOG) 0.1 % Apply 1 Application topically 2 (two) times daily. X7-14 days for rash.   No facility-administered encounter medications on file as of 04/29/2023.    Allergies (verified) Latex, Ambien [zolpidem], Augmentin [amoxicillin-pot clavulanate], Belsomra [suvorexant], Hydrocodone-acetaminophen, Hyoscyamine sulfate, Melatonin, Mobic [meloxicam], Morphine, Oxycodone, Tramadol, Celebrex [celecoxib], and Sulfa antibiotics   History: Past Medical History:  Diagnosis Date   Acute cystitis    Anxiety     Breast cancer detected by national screening programme (HCC) 03/12/2022   Depression    Diabetes mellitus type II    Diabetic retinopathy    DM type 2 causing CKD stage 3 (HCC)    Fibromyalgia    GERD (gastroesophageal reflux disease)    Hyperlipidemia    Hypertension    IBS (irritable bowel syndrome)    IBS (irritable bowel syndrome)    Insomnia    Lichen planus    vulvar   Pancreatitis, gallstone    Postmenopausal    Radial scar of breast 06/04/2016   Urge incontinence of urine    Past Surgical History:  Procedure Laterality Date   ANKLE SURGERY  02/2004   BREAST LUMPECTOMY WITH RADIOACTIVE SEED LOCALIZATION Left 01/22/2016   Procedure: LEFT BREAST LUMPECTOMY WITH RADIOACTIVE SEED LOCALIZATION;  Surgeon: Chevis Pretty III, MD;  Location: [facility];  Service: General;  Laterality:  Left;   CATARACT EXTRACTION W/ INTRAOCULAR LENS IMPLANT  09/2003, 12/2003   CHOLECYSTECTOMY  2004   EYE SURGERY     laser treatments   JOINT REPLACEMENT     TOTAL KNEE ARTHROPLASTY     Left   TRIGGER FINGER RELEASE Left 01/30/2002   TUBAL LIGATION  1970   Family History  Problem Relation Age of Onset   Diabetes Mother    Hypertension Mother    Kidney disease Mother    Heart disease Mother    Peripheral vascular disease Mother        amputation   Cancer Father        prostate   Diabetes Father    Heart disease Father        CABG   Hypertension Father    Heart attack Father    Depression Sister    Hypertension Sister    Neuropathy Sister    Social History   Socioeconomic History   Marital status: Widowed    Spouse name: Not on file   Number of children: 3   Years of education: 41   Highest education level: Some college, no degree  Occupational History   Occupation: retired    Associate Professor: RETIRED    Comment: bookeeping  Tobacco Use   Smoking status: Never   Smokeless tobacco: Never  Vaping Use   Vaping Use: Never used  Substance and Sexual Activity   Alcohol  use: No   Drug use: No   Sexual activity: Not Currently  Other Topics Concern   Not on file  Social History Narrative   Widowed in 2010; lives alone. Children live out of town   Social Determinants of Health   Financial Resource Strain: Low Risk  (04/29/2023)   Overall Financial Resource Strain (CARDIA)    Difficulty of Paying Living Expenses: Not hard at all  Food Insecurity: No Food Insecurity (04/29/2023)   Hunger Vital Sign    Worried About Running Out of Food in the Last Year: Never true    Ran Out of Food in the Last Year: Never true  Transportation Needs: No Transportation Needs (04/29/2023)   PRAPARE - Administrator, Civil Service (Medical): No    Lack of Transportation (Non-Medical): No  Physical Activity: Insufficiently Active (04/29/2023)   Exercise Vital Sign    Days of Exercise per Week: 3 days    Minutes of Exercise per Session: 30 min  Stress: No Stress Concern Present (04/29/2023)   [employer] of Occupational Health - Occupational Stress Questionnaire    Feeling of Stress : Not at all  Social Connections: Socially Isolated (04/29/2023)   Social Connection and Isolation Panel [NHANES]    Frequency of Communication with Friends and Family: More than three times a week    Frequency of Social Gatherings with Friends and Family: More than three times a week    Attends Religious Services: Never    Database administrator or Organizations: No    Attends Banker Meetings: Never    Marital Status: Widowed    Tobacco Counseling Counseling given: Not Answered   Clinical Intake:  Pre-visit preparation completed: Yes  Pain : No/denies pain     Nutritional Risks: None Diabetes: Yes CBG done?: No Did pt. bring in CBG monitor from home?: No  How often do you need to have someone help you when you read instructions, pamphlets, or other written materials from your doctor or pharmacy?: 1 - Never  Interpreter  Needed?: No  Information entered  by :: Renie Ora, LPN   Activities of Daily Living    04/29/2023    9:57 AM 04/27/2023    6:53 PM  In your present state of health, do you have any difficulty performing the following activities:  Hearing? 0 0  Vision? 0 0  Difficulty concentrating or making decisions? 0 0  Walking or climbing stairs? 0 0  Dressing or bathing? 0 0  Doing errands, shopping? 0 0  Preparing Food and eating ? N N  Using the Toilet? N N  In the past six months, have you accidently leaked urine? N Y  Do you have problems with loss of bowel control? N N  Managing your Medications? N N  Managing your Finances? N N  Housekeeping or managing your Housekeeping? N N    Patient Care Team: [name], DO as PCP - General (Family Medicine) Huel Cote, MD as Consulting Physician (Obstetrics and Gynecology) Alfredo Martinez, MD as Consulting Physician (Urology) Rollene Rotunda, MD as Consulting Physician (Cardiology) Adam Phenix, DPM as Consulting Physician (Podiatry) Sherrie George, MD as Consulting Physician (Ophthalmology) Michaelle Copas, MD as Referring Physician (Optometry) Warden Fillers, MD (Endocrinology)  Indicate any recent Medical Services you may have received from other than [facility] providers in the past year (date may be approximate).     Assessment:   This is a routine wellness examination for [name].  Hearing/Vision screen Vision Screening - Comments:: Wears rx glasses - up to date with routine eye exams with  Dr.Lee   Dietary issues and exercise activities discussed:     Goals Addressed             This Visit's Progress    Prevent falls   On track      Depression Screen    03/30/2023   11:46 AM 03/30/2023   11:04 AM 01/25/2023   11:02 AM 01/25/2023   10:55 AM 07/23/2022   11:41 AM 04/14/2022    2:09 PM 03/15/2022    3:00 PM  PHQ 2/9 Scores  PHQ - 2 Score 1 0 2 0 0 2 2  PHQ- 9 Score 1  5 0  3 3    Fall Risk    04/29/2023    9:54 AM 04/27/2023    6:53 PM  03/30/2023   11:04 AM 01/25/2023   10:55 AM 07/23/2022   11:41 AM  Fall Risk   Falls in the past year? 0 0 0 0 0  Number falls in past yr: 0   0   Injury with Fall? 0   0   Risk for fall due to : No Fall Risks   No Fall Risks   Follow up Falls prevention discussed   Falls evaluation completed     MEDICARE RISK AT HOME:  Medicare Risk at Home - 04/29/23 0954     Any stairs in or around the home? Yes    If so, are there any without handrails? No    Home free of loose throw rugs in walkways, pet beds, electrical cords, etc? Yes    Adequate lighting in your home to reduce risk of falls? Yes    Life alert? No    Use of a cane, walker or w/c? No    Grab bars in the bathroom? Yes    Shower chair or bench in shower? Yes    Elevated toilet seat or a handicapped toilet? Yes  TIMED UP AND GO:  Was the test performed?  No    Cognitive Function:    03/02/2018    3:53 PM 01/19/2017    2:45 PM 01/16/2016    2:55 PM 12/30/2014   12:16 PM  MMSE - Mini Mental State Exam  Orientation to time 5 5 5 5   Orientation to Place 5 5 5 5   Registration 3 3 3 3   Attention/ Calculation 5 5 5 5   Recall 1 2 3 3   Language- name 2 objects 2 2 2 2   Language- repeat 1 1 1 1   Language- follow 3 step command 3 3 3 3   Language- read & follow direction 1 1 1 1   Write a sentence 1 1 1 1   Copy design 1 1 1 1   Total score 28 29 30 30         04/29/2023    9:57 AM 04/14/2022    2:12 PM 04/10/2020    1:51 PM 03/28/2019    1:39 PM  6CIT Screen  What Year? 0 points 0 points 0 points 0 points  What month? 0 points 0 points 0 points 0 points  What time? 0 points 0 points 0 points 0 points  Count back from 20 0 points 0 points 0 points 0 points  Months in reverse 0 points 0 points 0 points 0 points  Repeat phrase 0 points 2 points 0 points 0 points  Total Score 0 points 2 points 0 points 0 points    Immunizations Immunization History  Administered Date(s) Administered   Fluad Quad(high Dose 65+)  07/09/2019, 09/08/2020, 07/28/2022   Influenza, High Dose Seasonal PF 07/19/2016, 10/13/2017, 08/28/2018   Influenza,inj,Quad PF,6+ Mos 07/30/2013, 07/24/2014, 08/04/2015   Moderna SARS-COV2 Booster Vaccination 09/13/2020   Moderna Sars-Covid-2 Vaccination 11/29/2019, 12/31/2019   Pneumococcal Conjugate-13 11/27/2014   Pneumococcal Polysaccharide-23 10/22/2009   Td 10/25/2004   Tdap 11/27/2014   Zoster Recombinant(Shingrix) 08/19/2021, 10/30/2021   Zoster, Live 04/06/2012    TDAP status: Up to date  Flu Vaccine status: Up to date  Pneumococcal vaccine status: Up to date  Covid-19 vaccine status: Completed vaccines  Qualifies for Shingles Vaccine? Yes   Zostavax completed Yes   Shingrix Completed?: Yes  Screening Tests Health Maintenance  Topic Date Due   COVID-19 Vaccine (3 - Moderna risk series) 10/11/2020   INFLUENZA VACCINE  05/26/2023   OPHTHALMOLOGY EXAM  07/07/2023   HEMOGLOBIN A1C  07/27/2023   Diabetic kidney evaluation - Urine ACR  01/25/2024   FOOT EXAM  01/25/2024   Diabetic kidney evaluation - eGFR measurement  03/29/2024   Medicare Annual Wellness (AWV)  04/28/2024   DTaP/Tdap/Td (3 - Td or Tdap) 11/27/2024   Pneumonia Vaccine 20+ Years old  Completed   DEXA SCAN  Completed   Zoster Vaccines- Shingrix  Completed   HPV VACCINES  Aged Out    Health Maintenance  Health Maintenance Due  Topic Date Due   COVID-19 Vaccine (3 - Moderna risk series) 10/11/2020    Colorectal cancer screening: No longer required.   Mammogram status: No longer required due to age.  Bone Density status: Ordered declined . Pt provided with contact info and advised to call to schedule appt.  Lung Cancer Screening: (Low Dose CT Chest recommended if Age 84-80 years, 20 pack-year currently smoking OR have quit w/in 15years.) does not qualify.   Lung Cancer Screening Referral: n/a  Additional Screening:  Hepatitis C Screening: does not qualify;   Vision Screening:  Recommended annual  ophthalmology exams for early detection of glaucoma and other disorders of the eye. Is the patient up to date with their annual eye exam?  Yes  Who is the provider or what is the name of the office in which the patient attends annual eye exams? Dr.Lee  If pt is not established with a provider, would they like to be referred to a provider to establish care? No .   Dental Screening: Recommended annual dental exams for proper oral hygiene  Diabetic Foot Exam: Diabetic Foot Exam: Overdue, Pt has been advised about the importance in completing this exam. Pt is scheduled for diabetic foot exam on next office visit .  Community Resource Referral / Chronic Care Management: CRR required this visit?  No   CCM required this visit?  No     Plan:     I have personally reviewed and noted the following in the patient's chart:   Medical and social history Use of alcohol, tobacco or illicit drugs  Current medications and supplements including opioid prescriptions. Patient is not currently taking opioid prescriptions. Functional ability and status Nutritional status Physical activity Advanced directives List of other physicians Hospitalizations, surgeries, and ER visits in previous 12 months Vitals Screenings to include cognitive, depression, and falls Referrals and appointments  In addition, I have reviewed and discussed with patient certain preventive protocols, quality metrics, and best practice recommendations. A written personalized care plan for preventive services as well as general preventive health recommendations were provided to patient.     Lorrene Reid, LPN   0/03/3015   After Visit Summary: (MyChart) Due to this being a telephonic visit, the after visit summary with patients personalized plan was offered to patient via MyChart   Nurse Notes: none

## 2023-04-29 NOTE — Patient Instructions (Signed)
[name] , Thank you for taking time to come for your Medicare Wellness Visit. I appreciate your ongoing commitment to your health goals. Please review the following plan we discussed and let me know if I can assist you in the future.   These are the goals we discussed:  Goals      Exercise 3x per week (30 min per time)     Prevent falls        This is a list of the screening recommended for you and due dates:  Health Maintenance  Topic Date Due   COVID-19 Vaccine (3 - Moderna risk series) 10/11/2020   Flu Shot  05/26/2023   Eye exam for diabetics  07/07/2023   Hemoglobin A1C  07/27/2023   Yearly kidney health urinalysis for diabetes  01/25/2024   Complete foot exam   01/25/2024   Yearly kidney function blood test for diabetes  03/29/2024   Medicare Annual Wellness Visit  04/28/2024   DTaP/Tdap/Td vaccine (3 - Td or Tdap) 11/27/2024   Pneumonia Vaccine  Completed   DEXA scan (bone density measurement)  Completed   Zoster (Shingles) Vaccine  Completed   HPV Vaccine  Aged Out    Advanced directives: Please bring a copy of your health care power of attorney and living will to the office to be added to your chart at your convenience.   Conditions/risks identified: Aim for 30 minutes of exercise or brisk walking, 6-8 glasses of water, and 5 servings of fruits and vegetables each day.   Next appointment: Follow up in one year for your annual wellness visit    Preventive Care 65 Years and Older, Female Preventive care refers to lifestyle choices and visits with your health care provider that can promote health and wellness. What does preventive care include? A yearly physical exam. This is also called an annual well check. Dental exams once or twice a year. Routine eye exams. Ask your health care provider how often you should have your eyes checked. Personal lifestyle choices, including: Daily care of your teeth and gums. Regular physical activity. Eating a healthy  diet. Avoiding tobacco and drug use. Limiting alcohol use. Practicing safe sex. Taking low-dose aspirin every day. Taking vitamin and mineral supplements as recommended by your health care provider. What happens during an annual well check? The services and screenings done by your health care provider during your annual well check will depend on your age, overall health, lifestyle risk factors, and family history of disease. Counseling  Your health care provider may ask you questions about your: Alcohol use. Tobacco use. Drug use. Emotional well-being. Home and relationship well-being. Sexual activity. Eating habits. History of falls. Memory and ability to understand (cognition). Work and work Astronomer. Reproductive health. Screening  You may have the following tests or measurements: Height, weight, and BMI. Blood pressure. Lipid and cholesterol levels. These may be checked every 5 years, or more frequently if you are over 72 years old. Skin check. Lung cancer screening. You may have this screening every year starting at age 37 if you have a 30-pack-year history of smoking and currently smoke or have quit within the past 15 years. Fecal occult blood test (FOBT) of the stool. You may have this test every year starting at age 21. Flexible sigmoidoscopy or colonoscopy. You may have a sigmoidoscopy every 5 years or a colonoscopy every 10 years starting at age 85. Hepatitis C blood test. Hepatitis B blood test. Sexually transmitted disease (STD) testing. Diabetes screening.  This is done by checking your blood sugar (glucose) after you have not eaten for a while (fasting). You may have this done every 1-3 years. Bone density scan. This is done to screen for osteoporosis. You may have this done starting at age 64. Mammogram. This may be done every 1-2 years. Talk to your health care provider about how often you should have regular mammograms. Talk with your health care provider about  your test results, treatment options, and if necessary, the need for more tests. Vaccines  Your health care provider may recommend certain vaccines, such as: Influenza vaccine. This is recommended every year. Tetanus, diphtheria, and acellular pertussis (Tdap, Td) vaccine. You may need a Td booster every 10 years. Zoster vaccine. You may need this after age 7. Pneumococcal 13-valent conjugate (PCV13) vaccine. One dose is recommended after age 67. Pneumococcal polysaccharide (PPSV23) vaccine. One dose is recommended after age 25. Talk to your health care provider about which screenings and vaccines you need and how often you need them. This information is not intended to replace advice given to you by your health care provider. Make sure you discuss any questions you have with your health care provider. Document Released: 11/07/2015 Document Revised: 06/30/2016 Document Reviewed: 08/12/2015 Elsevier Interactive Patient Education  2017 [employer].  Fall Prevention in the Home Falls can cause injuries. They can happen to people of all ages. There are many things you can do to make your home safe and to help prevent falls. What can I do on the outside of my home? Regularly fix the edges of walkways and driveways and fix any cracks. Remove anything that might make you trip as you walk through a door, such as a raised step or threshold. Trim any bushes or trees on the path to your home. Use bright outdoor lighting. Clear any walking paths of anything that might make someone trip, such as rocks or tools. Regularly check to see if handrails are loose or broken. Make sure that both sides of any steps have handrails. Any raised decks and porches should have guardrails on the edges. Have any leaves, snow, or ice cleared regularly. Use sand or salt on walking paths during winter. Clean up any spills in your garage right away. This includes oil or grease spills. What can I do in the bathroom? Use  night lights. Install grab bars by the toilet and in the tub and shower. Do not use towel bars as grab bars. Use non-skid mats or decals in the tub or shower. If you need to sit down in the shower, use a plastic, non-slip stool. Keep the floor dry. Clean up any water that spills on the floor as soon as it happens. Remove soap buildup in the tub or shower regularly. Attach bath mats securely with double-sided non-slip rug tape. Do not have throw rugs and other things on the floor that can make you trip. What can I do in the bedroom? Use night lights. Make sure that you have a light by your bed that is easy to reach. Do not use any sheets or blankets that are too big for your bed. They should not hang down onto the floor. Have a firm chair that has side arms. You can use this for support while you get dressed. Do not have throw rugs and other things on the floor that can make you trip. What can I do in the kitchen? Clean up any spills right away. Avoid walking on wet floors. Keep items  that you use a lot in easy-to-reach places. If you need to reach something above you, use a strong step stool that has a grab bar. Keep electrical cords out of the way. Do not use floor polish or wax that makes floors slippery. If you must use wax, use non-skid floor wax. Do not have throw rugs and other things on the floor that can make you trip. What can I do with my stairs? Do not leave any items on the stairs. Make sure that there are handrails on both sides of the stairs and use them. Fix handrails that are broken or loose. Make sure that handrails are as long as the stairways. Check any carpeting to make sure that it is firmly attached to the stairs. Fix any carpet that is loose or worn. Avoid having throw rugs at the top or bottom of the stairs. If you do have throw rugs, attach them to the floor with carpet tape. Make sure that you have a light switch at the top of the stairs and the bottom of the  stairs. If you do not have them, ask someone to add them for you. What else can I do to help prevent falls? Wear shoes that: Do not have high heels. Have rubber bottoms. Are comfortable and fit you well. Are closed at the toe. Do not wear sandals. If you use a stepladder: Make sure that it is fully opened. Do not climb a closed stepladder. Make sure that both sides of the stepladder are locked into place. Ask someone to hold it for you, if possible. Clearly mark and make sure that you can see: Any grab bars or handrails. First and last steps. Where the edge of each step is. Use tools that help you move around (mobility aids) if they are needed. These include: Canes. Walkers. Scooters. Crutches. Turn on the lights when you go into a dark area. Replace any light bulbs as soon as they burn out. Set up your furniture so you have a clear path. Avoid moving your furniture around. If any of your floors are uneven, fix them. If there are any pets around you, be aware of where they are. Review your medicines with your doctor. Some medicines can make you feel dizzy. This can increase your chance of falling. Ask your doctor what other things that you can do to help prevent falls. This information is not intended to replace advice given to you by your health care provider. Make sure you discuss any questions you have with your health care provider. Document Released: 08/07/2009 Document Revised: 03/18/2016 Document Reviewed: 11/15/2014 Elsevier Interactive Patient Education  2017 [employer].

## 2023-05-04 DIAGNOSIS — C50511 Malignant neoplasm of lower-outer quadrant of right female breast: Secondary | ICD-10-CM | POA: Diagnosis not present

## 2023-05-04 DIAGNOSIS — Z170 Estrogen receptor positive status [ER+]: Secondary | ICD-10-CM | POA: Diagnosis not present

## 2023-05-04 DIAGNOSIS — D696 Thrombocytopenia, unspecified: Secondary | ICD-10-CM | POA: Diagnosis not present

## 2023-05-04 DIAGNOSIS — L439 Lichen planus, unspecified: Secondary | ICD-10-CM | POA: Diagnosis not present

## 2023-05-11 ENCOUNTER — Encounter (INDEPENDENT_AMBULATORY_CARE_PROVIDER_SITE_OTHER): Admitting: Ophthalmology

## 2023-05-17 DIAGNOSIS — E1142 Type 2 diabetes mellitus with diabetic polyneuropathy: Secondary | ICD-10-CM | POA: Diagnosis not present

## 2023-05-17 DIAGNOSIS — L84 Corns and callosities: Secondary | ICD-10-CM | POA: Diagnosis not present

## 2023-05-20 ENCOUNTER — Encounter (INDEPENDENT_AMBULATORY_CARE_PROVIDER_SITE_OTHER): Payer: Medicare PPO | Admitting: Ophthalmology

## 2023-05-20 DIAGNOSIS — E113312 Type 2 diabetes mellitus with moderate nonproliferative diabetic retinopathy with macular edema, left eye: Secondary | ICD-10-CM | POA: Diagnosis not present

## 2023-05-20 DIAGNOSIS — E113391 Type 2 diabetes mellitus with moderate nonproliferative diabetic retinopathy without macular edema, right eye: Secondary | ICD-10-CM

## 2023-05-20 DIAGNOSIS — Z7984 Long term (current) use of oral hypoglycemic drugs: Secondary | ICD-10-CM | POA: Diagnosis not present

## 2023-05-20 DIAGNOSIS — H35033 Hypertensive retinopathy, bilateral: Secondary | ICD-10-CM | POA: Diagnosis not present

## 2023-05-20 DIAGNOSIS — H43813 Vitreous degeneration, bilateral: Secondary | ICD-10-CM

## 2023-05-20 DIAGNOSIS — I10 Essential (primary) hypertension: Secondary | ICD-10-CM | POA: Diagnosis not present

## 2023-07-15 ENCOUNTER — Encounter (INDEPENDENT_AMBULATORY_CARE_PROVIDER_SITE_OTHER): Payer: Medicare PPO | Admitting: Ophthalmology

## 2023-07-15 DIAGNOSIS — Z7984 Long term (current) use of oral hypoglycemic drugs: Secondary | ICD-10-CM | POA: Diagnosis not present

## 2023-07-15 DIAGNOSIS — E113391 Type 2 diabetes mellitus with moderate nonproliferative diabetic retinopathy without macular edema, right eye: Secondary | ICD-10-CM | POA: Diagnosis not present

## 2023-07-15 DIAGNOSIS — N898 Other specified noninflammatory disorders of vagina: Secondary | ICD-10-CM | POA: Diagnosis not present

## 2023-07-15 DIAGNOSIS — H43813 Vitreous degeneration, bilateral: Secondary | ICD-10-CM

## 2023-07-15 DIAGNOSIS — H35033 Hypertensive retinopathy, bilateral: Secondary | ICD-10-CM

## 2023-07-15 DIAGNOSIS — I10 Essential (primary) hypertension: Secondary | ICD-10-CM | POA: Diagnosis not present

## 2023-07-15 DIAGNOSIS — E113312 Type 2 diabetes mellitus with moderate nonproliferative diabetic retinopathy with macular edema, left eye: Secondary | ICD-10-CM | POA: Diagnosis not present

## 2023-07-15 DIAGNOSIS — L900 Lichen sclerosus et atrophicus: Secondary | ICD-10-CM | POA: Diagnosis not present

## 2023-07-25 ENCOUNTER — Telehealth: Payer: Self-pay | Admitting: Family Medicine

## 2023-07-25 ENCOUNTER — Other Ambulatory Visit: Payer: Self-pay

## 2023-07-25 DIAGNOSIS — Z794 Long term (current) use of insulin: Secondary | ICD-10-CM

## 2023-07-25 DIAGNOSIS — E1169 Type 2 diabetes mellitus with other specified complication: Secondary | ICD-10-CM

## 2023-07-25 DIAGNOSIS — D696 Thrombocytopenia, unspecified: Secondary | ICD-10-CM

## 2023-07-26 ENCOUNTER — Other Ambulatory Visit

## 2023-07-26 DIAGNOSIS — E1140 Type 2 diabetes mellitus with diabetic neuropathy, unspecified: Secondary | ICD-10-CM | POA: Diagnosis not present

## 2023-07-26 DIAGNOSIS — E119 Type 2 diabetes mellitus without complications: Secondary | ICD-10-CM | POA: Diagnosis not present

## 2023-07-26 DIAGNOSIS — E785 Hyperlipidemia, unspecified: Secondary | ICD-10-CM | POA: Diagnosis not present

## 2023-07-26 DIAGNOSIS — Z794 Long term (current) use of insulin: Secondary | ICD-10-CM

## 2023-07-26 DIAGNOSIS — D696 Thrombocytopenia, unspecified: Secondary | ICD-10-CM

## 2023-07-26 DIAGNOSIS — E1169 Type 2 diabetes mellitus with other specified complication: Secondary | ICD-10-CM | POA: Diagnosis not present

## 2023-07-26 LAB — CMP14+EGFR
ALT: 30 [IU]/L (ref 0–32)
AST: 33 [IU]/L (ref 0–40)
Albumin: 4.1 g/dL (ref 3.7–4.7)
Alkaline Phosphatase: 55 [IU]/L (ref 44–121)
BUN/Creatinine Ratio: 23 (ref 12–28)
BUN: 26 mg/dL (ref 8–27)
Bilirubin Total: 0.5 mg/dL (ref 0.0–1.2)
CO2: 27 mmol/L (ref 20–29)
Calcium: 9.7 mg/dL (ref 8.7–10.3)
Chloride: 98 mmol/L (ref 96–106)
Creatinine, Ser: 1.14 mg/dL — ABNORMAL HIGH (ref 0.57–1.00)
Globulin, Total: 2 g/dL (ref 1.5–4.5)
Glucose: 190 mg/dL — ABNORMAL HIGH (ref 70–99)
Potassium: 4.3 mmol/L (ref 3.5–5.2)
Sodium: 140 mmol/L (ref 134–144)
Total Protein: 6.1 g/dL (ref 6.0–8.5)
eGFR: 48 mL/min/{1.73_m2} — ABNORMAL LOW (ref >59)

## 2023-07-26 LAB — CBC WITH DIFFERENTIAL/PLATELET
Basophils Absolute: 0 10*3/uL (ref 0.0–0.2)
Basos: 1 %
EOS (ABSOLUTE): 0.2 10*3/uL (ref 0.0–0.4)
Eos: 5 %
Hematocrit: 42.5 % (ref 34.0–46.6)
Hemoglobin: 13.6 g/dL (ref 11.1–15.9)
Immature Grans (Abs): 0 10*3/uL (ref 0.0–0.1)
Immature Granulocytes: 0 %
Lymphocytes Absolute: 1.3 10*3/uL (ref 0.7–3.1)
Lymphs: 34 %
MCH: 30.8 pg (ref 26.6–33.0)
MCHC: 32 g/dL (ref 31.5–35.7)
MCV: 96 fL (ref 79–97)
Monocytes Absolute: 0.4 10*3/uL (ref 0.1–0.9)
Monocytes: 9 %
Neutrophils Absolute: 2 10*3/uL (ref 1.4–7.0)
Neutrophils: 51 %
Platelets: 158 10*3/uL (ref 150–450)
RBC: 4.41 x10E6/uL (ref 3.77–5.28)
RDW: 11.9 % (ref 11.7–15.4)
WBC: 3.9 10*3/uL (ref 3.4–10.8)

## 2023-07-26 LAB — LIPID PANEL
Chol/HDL Ratio: 2 {ratio} (ref 0.0–4.4)
Cholesterol, Total: 153 mg/dL (ref 100–199)
HDL: 76 mg/dL (ref >39)
LDL Chol Calc (NIH): 59 mg/dL (ref 0–99)
Triglycerides: 100 mg/dL (ref 0–149)
VLDL Cholesterol Cal: 18 mg/dL (ref 5–40)

## 2023-07-26 LAB — BAYER DCA HB A1C WAIVED: HB A1C (BAYER DCA - WAIVED): 6.2 % — ABNORMAL HIGH (ref 4.8–5.6)

## 2023-07-27 ENCOUNTER — Encounter: Payer: Self-pay | Admitting: Family Medicine

## 2023-07-27 ENCOUNTER — Ambulatory Visit: Payer: Medicare PPO | Admitting: Family Medicine

## 2023-07-27 VITALS — BP 120/58 | HR 62 | Temp 98.3°F | Ht 63.0 in | Wt 142.0 lb

## 2023-07-27 DIAGNOSIS — Z0001 Encounter for general adult medical examination with abnormal findings: Secondary | ICD-10-CM

## 2023-07-27 DIAGNOSIS — E11319 Type 2 diabetes mellitus with unspecified diabetic retinopathy without macular edema: Secondary | ICD-10-CM

## 2023-07-27 DIAGNOSIS — Z0000 Encounter for general adult medical examination without abnormal findings: Secondary | ICD-10-CM

## 2023-07-27 DIAGNOSIS — E1142 Type 2 diabetes mellitus with diabetic polyneuropathy: Secondary | ICD-10-CM

## 2023-07-27 DIAGNOSIS — Z794 Long term (current) use of insulin: Secondary | ICD-10-CM

## 2023-07-27 DIAGNOSIS — Z23 Encounter for immunization: Secondary | ICD-10-CM

## 2023-07-27 DIAGNOSIS — K219 Gastro-esophageal reflux disease without esophagitis: Secondary | ICD-10-CM

## 2023-07-27 DIAGNOSIS — E1159 Type 2 diabetes mellitus with other circulatory complications: Secondary | ICD-10-CM | POA: Diagnosis not present

## 2023-07-27 DIAGNOSIS — F3289 Other specified depressive episodes: Secondary | ICD-10-CM

## 2023-07-27 DIAGNOSIS — E1169 Type 2 diabetes mellitus with other specified complication: Secondary | ICD-10-CM

## 2023-07-27 DIAGNOSIS — H9193 Unspecified hearing loss, bilateral: Secondary | ICD-10-CM

## 2023-07-27 DIAGNOSIS — M1990 Unspecified osteoarthritis, unspecified site: Secondary | ICD-10-CM

## 2023-07-27 DIAGNOSIS — N1831 Chronic kidney disease, stage 3a: Secondary | ICD-10-CM

## 2023-07-27 DIAGNOSIS — E1122 Type 2 diabetes mellitus with diabetic chronic kidney disease: Secondary | ICD-10-CM

## 2023-07-27 DIAGNOSIS — I152 Hypertension secondary to endocrine disorders: Secondary | ICD-10-CM

## 2023-07-27 MED ORDER — ESOMEPRAZOLE MAGNESIUM 20 MG PO CPDR
20.0000 mg | DELAYED_RELEASE_CAPSULE | Freq: Every day | ORAL | 3 refills | Status: DC
Start: 2023-07-27 — End: 2024-07-27

## 2023-07-27 MED ORDER — AMLODIPINE BESYLATE 5 MG PO TABS
5.0000 mg | ORAL_TABLET | Freq: Every day | ORAL | 3 refills | Status: DC
Start: 2023-07-27 — End: 2024-07-09

## 2023-07-27 MED ORDER — ESCITALOPRAM OXALATE 20 MG PO TABS: 20.0000 mg | ORAL_TABLET | Freq: Every day | ORAL | 3 refills | Status: DC

## 2023-07-27 MED ORDER — ATORVASTATIN CALCIUM 10 MG PO TABS
10.0000 mg | ORAL_TABLET | Freq: Every day | ORAL | 3 refills | Status: DC
Start: 2023-07-27 — End: 2023-09-06

## 2023-07-27 MED ORDER — LANTUS SOLOSTAR 100 UNIT/ML ~~LOC~~ SOPN: 22.0000 [IU] | PEN_INJECTOR | SUBCUTANEOUS | 99 refills | Status: DC

## 2023-07-27 MED ORDER — DICLOFENAC SODIUM 1 % EX GEL: 4.0000 g | Freq: Four times a day (QID) | CUTANEOUS | 99 refills | Status: AC | PRN

## 2023-07-27 NOTE — Progress Notes (Signed)
[name] is a 82 y.o. female presents to office today for annual physical exam examination.    Concerns today include: 1. Type 2 Diabetes with hypertension, hyperlipidemia:  Patient reports compliance with all medications.  She has had some occasional lows and occasional highs.  She needs refills on several today.  She continues to see endocrinology and notes that her Trulicity was recently reduced to 0.75 mg weekly due to weight loss and hypoglycemic episodes.  She has seen Dr. Alan Mulder for her eye exam.  Last eye exam: needs Last foot exam: UTD Last A1c:  Lab Results  Component Value Date   HGBA1C 6.2 (H) 07/26/2023   Nephropathy screen indicated?: UTD Last flu, zoster and/or pneumovax:  Immunization History  Administered Date(s) Administered   Fluad Quad(high Dose 65+) 07/09/2019, 09/08/2020, 07/28/2022   Influenza, High Dose Seasonal PF 07/19/2016, 10/13/2017, 08/28/2018   Influenza,inj,Quad PF,6+ Mos 07/30/2013, 07/24/2014, 08/04/2015   Moderna SARS-COV2 Booster Vaccination 09/13/2020   Moderna Sars-Covid-2 Vaccination 11/29/2019, 12/31/2019   Pneumococcal Conjugate-13 11/27/2014   Pneumococcal Polysaccharide-23 10/22/2009   Td 10/25/2004   Tdap 11/27/2014   Zoster Recombinant(Shingrix) 08/19/2021, 10/30/2021   Zoster, Live 04/06/2012    ROS: No chest pain, shortness of breath, edema or falls reported  Occupation: retired Substance use: none Health Maintenance Due  Topic Date Due   COVID-19 Vaccine (3 - Moderna risk series) 10/11/2020   INFLUENZA VACCINE  05/26/2023   OPHTHALMOLOGY EXAM  07/07/2023   Refills needed today: all  Immunization History  Administered Date(s) Administered   Fluad Quad(high Dose 65+) 07/09/2019, 09/08/2020, 07/28/2022   Influenza, High Dose Seasonal PF 07/19/2016, 10/13/2017, 08/28/2018   Influenza,inj,Quad PF,6+ Mos 07/30/2013, 07/24/2014, 08/04/2015   Moderna SARS-COV2 Booster Vaccination 09/13/2020   Moderna Sars-Covid-2  Vaccination 11/29/2019, 12/31/2019   Pneumococcal Conjugate-13 11/27/2014   Pneumococcal Polysaccharide-23 10/22/2009   Td 10/25/2004   Tdap 11/27/2014   Zoster Recombinant(Shingrix) 08/19/2021, 10/30/2021   Zoster, Live 04/06/2012   Past Medical History:  Diagnosis Date   Acute cystitis    Anxiety    Breast cancer detected by national screening programme (HCC) 03/12/2022   Depression    Diabetes mellitus type II    Diabetic retinopathy    DM type 2 causing CKD stage 3 (HCC)    Fibromyalgia    GERD (gastroesophageal reflux disease)    Hyperlipidemia    Hypertension    IBS (irritable bowel syndrome)    IBS (irritable bowel syndrome)    Insomnia    Lichen planus    vulvar   Pancreatitis, gallstone    Postmenopausal    Radial scar of breast 06/04/2016   Urge incontinence of urine    Social History   Socioeconomic History   Marital status: Widowed    Spouse name: Not on file   Number of children: 3   Years of education: 53   Highest education level: Some college, no degree  Occupational History   Occupation: retired    Associate Professor: RETIRED    Comment: bookeeping  Tobacco Use   Smoking status: Never   Smokeless tobacco: Never  Vaping Use   Vaping status: Never Used  Substance and Sexual Activity   Alcohol use: No   Drug use: No   Sexual activity: Not Currently  Other Topics Concern   Not on file  Social History Narrative   Widowed in 2010; lives alone. Children live out of town   Social Determinants of Health   Financial Resource Strain: Low Risk  (  04/29/2023)   Overall Financial Resource Strain (CARDIA)    Difficulty of Paying Living Expenses: Not hard at all  Food Insecurity: No Food Insecurity (04/29/2023)   Hunger Vital Sign    Worried About Running Out of Food in the Last Year: Never true    Ran Out of Food in the Last Year: Never true  Transportation Needs: No Transportation Needs (04/29/2023)   PRAPARE - Administrator, Civil Service (Medical): No     Lack of Transportation (Non-Medical): No  Physical Activity: Insufficiently Active (04/29/2023)   Exercise Vital Sign    Days of Exercise per Week: 3 days    Minutes of Exercise per Session: 30 min  Stress: No Stress Concern Present (04/29/2023)   [employer] of Occupational Health - Occupational Stress Questionnaire    Feeling of Stress : Not at all  Social Connections: Socially Isolated (04/29/2023)   Social Connection and Isolation Panel [NHANES]    Frequency of Communication with Friends and Family: More than three times a week    Frequency of Social Gatherings with Friends and Family: More than three times a week    Attends Religious Services: Never    Database administrator or Organizations: No    Attends Banker Meetings: Never    Marital Status: Widowed  Intimate Partner Violence: Not At Risk (04/29/2023)   Humiliation, Afraid, Rape, and Kick questionnaire    Fear of Current or Ex-Partner: No    Emotionally Abused: No    Physically Abused: No    Sexually Abused: No   Past Surgical History:  Procedure Laterality Date   ANKLE SURGERY  02/2004   BREAST LUMPECTOMY WITH RADIOACTIVE SEED LOCALIZATION Left 01/22/2016   Procedure: LEFT BREAST LUMPECTOMY WITH RADIOACTIVE SEED LOCALIZATION;  Surgeon: Chevis Pretty III, MD;  Location: [facility];  Service: General;  Laterality: Left;   CATARACT EXTRACTION W/ INTRAOCULAR LENS IMPLANT  09/2003, 12/2003   CHOLECYSTECTOMY  2004   EYE SURGERY     laser treatments   JOINT REPLACEMENT     TOTAL KNEE ARTHROPLASTY     Left   TRIGGER FINGER RELEASE Left 01/30/2002   TUBAL LIGATION  1970   Family History  Problem Relation Age of Onset   Diabetes Mother    Hypertension Mother    Kidney disease Mother    Heart disease Mother    Peripheral vascular disease Mother        amputation   Cancer Father        prostate   Diabetes Father    Heart disease Father        CABG   Hypertension Father    Heart attack  Father    Depression Sister    Hypertension Sister    Neuropathy Sister     Current Outpatient Medications:    acetaminophen (TYLENOL) 325 MG tablet, Take 325 mg by mouth as needed., Disp: , Rfl:    amLODipine (NORVASC) 5 MG tablet, Take 1 tablet (5 mg total) by mouth daily., Disp: 90 tablet, Rfl: 3   aspirin 81 MG tablet, Take 81 mg by mouth every other day., Disp: , Rfl:    atorvastatin (LIPITOR) 10 MG tablet, Take 1 tablet (10 mg total) by mouth at bedtime., Disp: 90 tablet, Rfl: 3   BESIVANCE 0.6 % SUSP, Apply to eye., Disp: , Rfl:    Calcium Carb-Cholecalciferol 600-200 MG-UNIT TABS, Take 1 tablet by mouth 2 (two) times daily., Disp: , Rfl:  clobetasol (TEMOVATE) 0.05 % GEL, Apply topically daily., Disp: , Rfl:    desonide (DESOWEN) 0.05 % cream, Apply topically 2 (two) times daily., Disp: 30 g, Rfl: 1   diclofenac Sodium (VOLTAREN) 1 % GEL, Apply 4 g topically 4 (four) times daily as needed (arthritis)., Disp: 400 g, Rfl: prn   Dulaglutide (TRULICITY) 1.5 MG/0.5ML SOPN, Inject 1.5 mg into the skin once a week., Disp: 7.5 mL, Rfl: 3   escitalopram (LEXAPRO) 20 MG tablet, Take 1 tablet (20 mg total) by mouth daily., Disp: 90 tablet, Rfl: 3   esomeprazole (NEXIUM) 20 MG capsule, Take 1 capsule (20 mg total) by mouth daily at 12 noon., Disp: 90 capsule, Rfl: 3   exemestane (AROMASIN) 25 MG tablet, Take 25 mg by mouth daily after breakfast., Disp: , Rfl:    fluticasone (FLONASE) 50 MCG/ACT nasal spray, Place 2 sprays into both nostrils daily., Disp: 16 g, Rfl: 6   glucose blood (FREESTYLE LITE) test strip, Use qid to check blood sugar. Dx E11.9, Disp: 400 each, Rfl: 2   insulin aspart (NOVOLOG FLEXPEN) 100 UNIT/ML FlexPen, BG less than 120 - no insulin; 121 to 150 - 3 units; 151 to 200 - 4 units; 201 to 250 - 5 units; 251 to 300 - 6 units; 301-350 - 7 units, 351 or above 8units, Disp: 60 mL, Rfl: PRN   insulin glargine (LANTUS SOLOSTAR) 100 UNIT/ML Solostar Pen, Inject 26 Units into the  skin every morning., Disp: 45 mL, Rfl: PRN   Insulin Pen Needle 31G X 5 MM MISC, 1 Device by Does not apply route 4 (four) times daily., Disp: 300 each, Rfl: 4   Multiple Vitamin (MULTIVITAMIN) tablet, Take 1 tablet by mouth daily., Disp: , Rfl:    nystatin powder, APPLY UNDER BILATERAL BREASTS AND GROIN TWICE A DAY (If needed), Disp: 60 g, Rfl: 1   nystatin-triamcinolone ointment (MYCOLOG), APPLY TOPICALLY 2 (TWO) TIMES DAILY IF NEEDED FOR rash.  NOT A PREVENTATIVE MEDICATION., Disp: 60 g, Rfl: 3   Polyethyl Glycol-Propyl Glycol 0.4-0.3 % SOLN, Apply 1 drop to eye daily as needed (for dry eye relief). , Disp: , Rfl:    polyethylene glycol (MIRALAX / GLYCOLAX) packet, Take 17 g by mouth daily as needed. , Disp: , Rfl:    Probiotic Product (PROBIOTIC ADVANCED PO), Take 1 capsule by mouth daily. , Disp: , Rfl:    traZODone (DESYREL) 100 MG tablet, Take 1 tablet (100 mg total) by mouth at bedtime., Disp: 90 tablet, Rfl: PRN   triamcinolone cream (KENALOG) 0.1 %, Apply 1 Application topically 2 (two) times daily. X7-14 days for rash., Disp: 30 g, Rfl: 0  Allergies  Allergen Reactions   Latex Anaphylaxis and Rash   Ambien [Zolpidem] Other (See Comments)    Hallucinations   Augmentin [Amoxicillin-Pot Clavulanate] Nausea And Vomiting   Belsomra [Suvorexant] Other (See Comments)    Hallucinations   Hydrocodone-Acetaminophen Other (See Comments)    hallucinations   Hyoscyamine Sulfate Other (See Comments)   Melatonin Other (See Comments)    Adverse reaction-"keeps me awake"   Mobic [Meloxicam]    Morphine Nausea And Vomiting    REACTION: hallucinations,GI upset   Oxycodone Other (See Comments)    Hallucinations   Tramadol     Unknown reaction   Celebrex [Celecoxib] Itching and Rash   Sulfa Antibiotics Rash     ROS: Review of Systems A comprehensive review of systems was negative except for: Eyes: positive for contacts/glasses Ears, nose, mouth, throat, and face:  positive for hearing loss  and she would like to actually go ahead and see an audiologist to get this further evaluated Genitourinary: positive for urinary incontinence and she will be seeing urology on August 23, 2023 for a procedure to hopefully test if her bladder will respond to some type of implant    Physical exam BP (!) 120/58   Pulse 62   Temp 98.3 F (36.8 C)   Ht 5\' 3"  (1.6 m)   Wt 142 lb (64.4 kg)   SpO2 98%   BMI 25.15 kg/m  General appearance: alert, cooperative, appears stated age, and no distress Head: Normocephalic, without obvious abnormality, atraumatic Eyes: negative findings: lids and lashes normal, conjunctivae and sclerae normal, corneas clear, and pupils equal, round, reactive to light and accomodation Ears: normal TM's and external ear canals both ears Nose: Nares normal. Septum midline. Mucosa normal. No drainage or sinus tenderness. Throat: lips, mucosa, and tongue normal; teeth and gums normal Neck: no adenopathy, supple, symmetrical, trachea midline, and thyroid not enlarged, symmetric, no tenderness/mass/nodules Back: symmetric, no curvature. ROM normal. No CVA tenderness. Lungs: clear to auscultation bilaterally Heart: regular rate and rhythm, S1, S2 normal, no murmur, click, rub or gallop Abdomen: soft, non-tender; bowel sounds normal; no masses,  no organomegaly Extremities: extremities normal, atraumatic, no cyanosis or edema Pulses: 2+ and symmetric Skin: Skin color, texture, turgor normal. No rashes or lesions Lymph nodes: Cervical, supraclavicular, and axillary nodes normal. Neurologic: Grossly normal but hard of hearing     07/27/2023   10:49 AM 03/30/2023   11:46 AM 03/30/2023   11:04 AM  Depression screen PHQ 2/9  Decreased Interest 0 0 0  Down, Depressed, Hopeless 0 1 0  PHQ - 2 Score 0 1 0  Altered sleeping 0 0   Tired, decreased energy 0 0   Change in appetite 0 0   Feeling bad or failure about yourself  0 0   Trouble concentrating 0 0   Moving slowly or  fidgety/restless 0 0   Suicidal thoughts 0 0   PHQ-9 Score 0 1   Difficult doing work/chores  Not difficult at all       07/27/2023   10:49 AM 03/30/2023   11:47 AM 01/25/2023   11:02 AM 01/25/2023   10:56 AM  GAD 7 : Generalized Anxiety Score  Nervous, Anxious, on Edge 0 1 1 0  Control/stop worrying 0 0 1 0  Worry too much - different things 0 0 1 0  Trouble relaxing 0 0 1 0  Restless 0 0 0 0  Easily annoyed or irritable 0 0 0 0  Afraid - awful might happen 0 0 0 0  Total GAD 7 Score 0 1 4 0  Anxiety Difficulty Not difficult at all Not difficult at all Not difficult at all Not difficult at all     Assessment/ Plan: Berniece Salines here for annual physical exam.   Annual physical exam  Type 2 diabetes mellitus with stage 3a chronic kidney disease, with long-term current use of insulin (HCC) - Plan: Dulaglutide (TRULICITY) 0.75 MG/0.5ML SOPN, insulin glargine (LANTUS SOLOSTAR) 100 UNIT/ML Solostar Pen  Diabetic polyneuropathy associated with type 2 diabetes mellitus (HCC)  Diabetic retinopathy without macular edema associated with type 2 diabetes mellitus, unspecified laterality, unspecified retinopathy severity (HCC)  Hypertension associated with diabetes (HCC) - Plan: amLODipine (NORVASC) 5 MG tablet  Hyperlipidemia associated with type 2 diabetes mellitus (HCC) - Plan: atorvastatin (LIPITOR) 10 MG tablet  Bilateral hearing loss,  unspecified hearing loss type - Plan: Ambulatory referral to Audiology  Other depression - Plan: escitalopram (LEXAPRO) 20 MG tablet  Gastroesophageal reflux disease without esophagitis - Plan: esomeprazole (NEXIUM) 20 MG capsule  Arthritis - Plan: diclofenac Sodium (VOLTAREN) 1 % GEL  Encounter for immunization - Plan: Flu Vaccine Trivalent High Dose (Fluad)  Continue to follow-up with endocrinology as scheduled.  Release of information form for diabetic eye exam from Dr. Ashley Royalty office was completed today.  I changed her Trulicity to reflect  current usage of 0.75 mg weekly and also adjusted her Lantus to reflect current usage of 22 units daily.  Polyneuropathy and retinopathy sound stable.  Continue to follow-up with retinal specialist as directed.  Blood pressure is well-controlled with current regimen.  No changes are needed.  Renewal of Norvasc and Lipitor both sent.  We reviewed her labs and her cholesterol is well-controlled current regimen  I referred her to audiology for more in-depth evaluation of bilateral hearing loss and hopeful that she will get some hearing aids  We did not discuss depression, GERD or arthritis in detail today but symptoms have all been stable with current regimen and refills have been sent  Influenza vaccination administered.  Counseled on healthy lifestyle choices, including diet (rich in fruits, vegetables and lean meats and low in salt and simple carbohydrates) and exercise (at least 30 minutes of moderate physical activity daily).  Patient to follow up 6-12 months  Kona Yusuf M. Nadine Counts, DO

## 2023-07-27 NOTE — Patient Instructions (Addendum)
Ammie Ferrier is the audiologist with [facility] I've placed a referral but this is her information:  [facility] [address] [place],  [place]  [address] Main: [phone] Fax: [phone]

## 2023-08-18 DIAGNOSIS — E1142 Type 2 diabetes mellitus with diabetic polyneuropathy: Secondary | ICD-10-CM | POA: Diagnosis not present

## 2023-08-18 DIAGNOSIS — L84 Corns and callosities: Secondary | ICD-10-CM | POA: Diagnosis not present

## 2023-08-18 DIAGNOSIS — L603 Nail dystrophy: Secondary | ICD-10-CM | POA: Diagnosis not present

## 2023-08-18 DIAGNOSIS — M79673 Pain in unspecified foot: Secondary | ICD-10-CM | POA: Diagnosis not present

## 2023-08-22 ENCOUNTER — Telehealth: Payer: Self-pay | Admitting: Family Medicine

## 2023-08-22 DIAGNOSIS — Z0279 Encounter for issue of other medical certificate: Secondary | ICD-10-CM

## 2023-08-23 DIAGNOSIS — N3946 Mixed incontinence: Secondary | ICD-10-CM | POA: Diagnosis not present

## 2023-08-23 DIAGNOSIS — N3941 Urge incontinence: Secondary | ICD-10-CM | POA: Diagnosis not present

## 2023-08-23 NOTE — Telephone Encounter (Signed)
[facility] handicap form ready

## 2023-08-29 DIAGNOSIS — N3946 Mixed incontinence: Secondary | ICD-10-CM | POA: Diagnosis not present

## 2023-08-29 DIAGNOSIS — N3281 Overactive bladder: Secondary | ICD-10-CM | POA: Diagnosis not present

## 2023-09-05 ENCOUNTER — Encounter (INDEPENDENT_AMBULATORY_CARE_PROVIDER_SITE_OTHER): Payer: Medicare PPO | Admitting: Ophthalmology

## 2023-09-05 DIAGNOSIS — E113312 Type 2 diabetes mellitus with moderate nonproliferative diabetic retinopathy with macular edema, left eye: Secondary | ICD-10-CM

## 2023-09-05 DIAGNOSIS — Z7984 Long term (current) use of oral hypoglycemic drugs: Secondary | ICD-10-CM

## 2023-09-05 DIAGNOSIS — E113391 Type 2 diabetes mellitus with moderate nonproliferative diabetic retinopathy without macular edema, right eye: Secondary | ICD-10-CM

## 2023-09-05 DIAGNOSIS — I10 Essential (primary) hypertension: Secondary | ICD-10-CM | POA: Diagnosis not present

## 2023-09-05 DIAGNOSIS — H43813 Vitreous degeneration, bilateral: Secondary | ICD-10-CM

## 2023-09-05 DIAGNOSIS — H35033 Hypertensive retinopathy, bilateral: Secondary | ICD-10-CM

## 2023-09-06 ENCOUNTER — Telehealth: Payer: Self-pay | Admitting: Family Medicine

## 2023-09-06 ENCOUNTER — Other Ambulatory Visit: Payer: Self-pay | Admitting: Family Medicine

## 2023-09-06 DIAGNOSIS — E1169 Type 2 diabetes mellitus with other specified complication: Secondary | ICD-10-CM

## 2023-09-06 MED ORDER — ATORVASTATIN CALCIUM 10 MG PO TABS: 10.0000 mg | ORAL_TABLET | Freq: Every day | ORAL | 1 refills | Status: DC

## 2023-09-06 NOTE — Telephone Encounter (Signed)
Copied from CRM [id number]. Topic: Clinical - Medication Refill >> Sep 06, 2023  1:12 PM Dimitri Ped wrote: Most Recent Primary Care Visit:  Provider: [name]  Department: [facility]  Visit Type: PHYSICAL  Date: 07/27/2023  Medication: atorvastatin (LIPITOR) 10 MG tablet  Has the patient contacted their pharmacy? No patient say usually she just call the clinic and they electronically send to pharmacy (Agent: If no, request that the patient contact the pharmacy for the refill. If patient does not wish to contact the pharmacy document the reason why and proceed with request.) (Agent: If yes, when and what did the pharmacy advise?)  Is this the correct pharmacy for this prescription? Yes If no, delete pharmacy and type the correct one.  This is the patient's preferred pharmacy:  [facility] - [place], [place] - [address] [place] [address] Phone: [phone] Fax: [phone]  [id number] - [place], [place] - [address] [place] [address] Phone: [phone] Fax: [phone]   [facility] [place], [place] - [address] [place] [address] Phone: [phone] Fax: [phone]   Has the prescription been filled recently? No  Is the patient out of the medication? Yes  Has the patient been seen for an appointment in the last year OR does the patient have an upcoming appointment? Yes  Can we respond through MyChart? Yes  Agent: Please be advised that Rx refills may take up to 3 business days. We ask that you follow-up with your pharmacy.

## 2023-09-06 NOTE — Telephone Encounter (Signed)
Copied from CRM [id number]. Topic: Clinical - Medication Refill >> Sep 06, 2023  1:12 PM Dimitri Ped wrote: Most Recent Primary Care Visit:  Provider: [name]  [facility]: [facility]  Visit Type: PHYSICAL  Date: 07/27/2023  Medication: ***  Has the patient contacted their pharmacy?  (Agent: If no, request that the patient contact the pharmacy for the refill. If patient does not wish to contact the pharmacy document the reason why and proceed with request.) (Agent: If yes, when and what did the pharmacy advise?)  Is this the correct pharmacy for this prescription?  If no, delete pharmacy and type the correct one.  This is the patient's preferred pharmacy:  [facility] - [place], [place] - [address] [place] [address] Phone: [phone] Fax: [phone]  [id number] - [place], [place] - [address] [place] [address] Phone: [phone] Fax: [phone]  [facility] - [place], [place] - [address] [place] [address] Phone: [phone] Fax: [phone]  [facility] [place], [place] - [address] [place] [address] Phone: [phone] Fax: [phone]   Has the prescription been filled recently?   Is the patient out of the medication?   Has the patient been seen for an appointment in the last year OR does the patient have an upcoming appointment?   Can we respond through MyChart?   Agent: Please be advised that Rx refills may take up to 3 business days. We ask that you follow-up with your pharmacy.

## 2023-09-06 NOTE — Telephone Encounter (Signed)
Rx sent to pharmacy per patient's request.  

## 2023-09-09 ENCOUNTER — Encounter (INDEPENDENT_AMBULATORY_CARE_PROVIDER_SITE_OTHER): Admitting: Ophthalmology

## 2023-09-21 DIAGNOSIS — E1140 Type 2 diabetes mellitus with diabetic neuropathy, unspecified: Secondary | ICD-10-CM | POA: Diagnosis not present

## 2023-09-21 DIAGNOSIS — Z794 Long term (current) use of insulin: Secondary | ICD-10-CM | POA: Diagnosis not present

## 2023-09-21 LAB — HM DIABETES FOOT EXAM

## 2023-10-24 DIAGNOSIS — E1140 Type 2 diabetes mellitus with diabetic neuropathy, unspecified: Secondary | ICD-10-CM | POA: Diagnosis not present

## 2023-10-31 ENCOUNTER — Encounter (INDEPENDENT_AMBULATORY_CARE_PROVIDER_SITE_OTHER): Payer: Medicare PPO | Admitting: Ophthalmology

## 2023-10-31 DIAGNOSIS — I10 Essential (primary) hypertension: Secondary | ICD-10-CM

## 2023-10-31 DIAGNOSIS — H43813 Vitreous degeneration, bilateral: Secondary | ICD-10-CM

## 2023-10-31 DIAGNOSIS — E113313 Type 2 diabetes mellitus with moderate nonproliferative diabetic retinopathy with macular edema, bilateral: Secondary | ICD-10-CM | POA: Diagnosis not present

## 2023-10-31 DIAGNOSIS — H35033 Hypertensive retinopathy, bilateral: Secondary | ICD-10-CM

## 2023-10-31 DIAGNOSIS — Z7984 Long term (current) use of oral hypoglycemic drugs: Secondary | ICD-10-CM | POA: Diagnosis not present

## 2023-11-02 ENCOUNTER — Telehealth: Payer: Self-pay

## 2023-11-02 DIAGNOSIS — F5101 Primary insomnia: Secondary | ICD-10-CM

## 2023-11-02 MED ORDER — TRAZODONE HCL 100 MG PO TABS: 100.0000 mg | ORAL_TABLET | Freq: Every day | ORAL | 99 refills | Status: DC

## 2023-11-02 NOTE — Telephone Encounter (Signed)
 Copied from CRM [id number]. Topic: Clinical - Medication Refill >> Nov 02, 2023  1:21 PM Susanna ORN wrote: Most Recent Primary Care Visit:  Provider: JOLINDA NORENE HERO  Department: [facility]  Visit Type: PHYSICAL  Date: 07/27/2023  Medication: traZODone  (DESYREL ) 100 MG tablet  Has the patient contacted their pharmacy? Yes, no refills left on prescription. (Agent: If no, request that the patient contact the pharmacy for the refill. If patient does not wish to contact the pharmacy document the reason why and proceed with request.) (Agent: If yes, when and what did the pharmacy advise?)  Is this the correct pharmacy for this prescription? Yes If no, delete pharmacy and type the correct one.  This is the patient's preferred pharmacy:  [facility] - [place], [place] - [address] [place] [address] Phone: [phone] Fax: [phone]   Has the prescription been filled recently? Yes  Is the patient out of the medication? Yes  Has the patient been seen for an appointment in the last year OR does the patient have an upcoming appointment? Yes  Can we respond through MyChart? No  Agent: Please be advised that Rx refills may take up to 3 business days. We ask that you follow-up with your pharmacy.

## 2023-11-02 NOTE — Addendum Note (Signed)
 Addended by: [name] on: 11/02/2023 04:45 PM   Modules accepted: Orders

## 2023-11-02 NOTE — Telephone Encounter (Signed)
 Med refill  Meds ordered this encounter  Medications   traZODone (DESYREL) 100 MG tablet    Sig: Take 1 tablet (100 mg total) by mouth at bedtime.    Dispense:  90 tablet    Refill:  PRN    Disregard 150mg 

## 2023-12-16 ENCOUNTER — Ambulatory Visit: Payer: Self-pay | Admitting: Family Medicine

## 2023-12-16 NOTE — Telephone Encounter (Signed)
Copied from CRM [id number]. Topic: Clinical - Red Word Triage >> Dec 16, 2023 11:54 AM [name] wrote: Red Word that prompted transfer to Nurse Triage: pt is calling in a painful cyst/abscess on rectal . Reason for Disposition  [1] Spreading redness around the boil AND [2] no fever  Answer Assessment - Initial Assessment Questions 1. APPEARANCE of BOIL: "What does the boil look like?"      Cyst in rectal area there for a week. 2. LOCATION: "Where is the boil located?"      Rectal area   It's very painful 3. NUMBER: "How many boils are there?"      One cyst 4. SIZE: "How big is the boil?" (e.g., inches, cm; compare to size of a coin or other object)     It's feels like a pimple 5. ONSET: "When did the boil start?"     A week ago 6. PAIN: "Is there any pain?" If Yes, ask: "How bad is the pain?"   (Scale 1-10; or mild, moderate, severe)     Hurts 7. FEVER: "Do you have a fever?" If Yes, ask: "What is it, how was it measured, and when did it start?"      I don't think so 8. SOURCE: "Have you been around anyone with boils or other Staph infections?" "Have you ever had boils before?"     Not asked  9. OTHER SYMPTOMS: "Do you have any other symptoms?" (e.g., shaking chills, weakness, rash elsewhere on body)     No diarrhea or constipation 10. PREGNANCY: "Is there any chance you are pregnant?" "When was your last menstrual period?"       N/A  Protocols used: Boil (Skin Abscess)-A-AH  Chief Complaint: Painful cyst in rectal area Symptoms: above Frequency: Started a week ago Pertinent Negatives: Patient denies drainage or fever Disposition: [] ED /[] Urgent Care (no appt availability in office) / [x] Appointment(In office/virtual)/ []  [facility]/ [] Home Care/ [] Refused Recommended Disposition /[] [facility]/ []  Follow-up with PCP Additional Notes: Appt made with [name] for 12/20/2023 1:15.

## 2023-12-20 ENCOUNTER — Ambulatory Visit (INDEPENDENT_AMBULATORY_CARE_PROVIDER_SITE_OTHER): Payer: Medicare PPO | Admitting: Family Medicine

## 2023-12-20 VITALS — BP 112/56 | HR 70 | Temp 98.7°F | Ht 63.0 in | Wt 143.4 lb

## 2023-12-20 DIAGNOSIS — L729 Follicular cyst of the skin and subcutaneous tissue, unspecified: Secondary | ICD-10-CM | POA: Diagnosis not present

## 2023-12-20 NOTE — Progress Notes (Signed)
 Subjective: CC: Rectal lump PCP: [name], DO ZOX:[id number] C Bennie is a 83 y.o. female presenting to clinic today for:  1.  Rectal lump Patient reports on Friday she had a spot on her buttock that was very painful and inflamed.  She started putting cortisone cream in this area and it seemed to relieve.  Denies any drainage.  Not sure if this is a hemorrhoid or something else.  The pain does seem to be better today.   ROS: Per HPI  Allergies  Allergen Reactions   Latex Anaphylaxis and Rash   Ambien [Zolpidem] Other (See Comments)    Hallucinations   Augmentin [Amoxicillin-Pot Clavulanate] Nausea And Vomiting   Belsomra [Suvorexant] Other (See Comments)    Hallucinations   Hydrocodone-Acetaminophen Other (See Comments)    hallucinations   Hyoscyamine Sulfate Other (See Comments)   Melatonin Other (See Comments)    Adverse reaction-"keeps me awake"   Mobic [Meloxicam]    Morphine Nausea And Vomiting    REACTION: hallucinations,GI upset   Oxycodone Other (See Comments)    Hallucinations   Tramadol     Unknown reaction   Celebrex [Celecoxib] Itching and Rash   Sulfa Antibiotics Rash   Past Medical History:  Diagnosis Date   Acute cystitis    Anxiety    Breast cancer detected by national screening programme (HCC) 03/12/2022   Depression    Diabetes mellitus type II    Diabetic retinopathy    DM type 2 causing CKD stage 3 (HCC)    Fibromyalgia    GERD (gastroesophageal reflux disease)    Hyperlipidemia    Hypertension    IBS (irritable bowel syndrome)    IBS (irritable bowel syndrome)    Insomnia    Lichen planus    vulvar   Pancreatitis, gallstone    Postmenopausal    Radial scar of breast 06/04/2016   Urge incontinence of urine     Current Outpatient Medications:    acetaminophen (TYLENOL) 325 MG tablet, Take 325 mg by mouth as needed., Disp: , Rfl:    amLODipine (NORVASC) 5 MG tablet, Take 1 tablet (5 mg total) by mouth daily., Disp: 90 tablet, Rfl:  3   aspirin 81 MG tablet, Take 81 mg by mouth every other day., Disp: , Rfl:    atorvastatin (LIPITOR) 10 MG tablet, Take 1 tablet (10 mg total) by mouth at bedtime., Disp: 90 tablet, Rfl: 1   BESIVANCE 0.6 % SUSP, Apply to eye., Disp: , Rfl:    Calcium Carb-Cholecalciferol 600-200 MG-UNIT TABS, Take 1 tablet by mouth 2 (two) times daily., Disp: , Rfl:    clobetasol (TEMOVATE) 0.05 % GEL, Apply topically daily., Disp: , Rfl:    desonide (DESOWEN) 0.05 % cream, Apply topically 2 (two) times daily., Disp: 30 g, Rfl: 1   diclofenac Sodium (VOLTAREN) 1 % GEL, Apply 4 g topically 4 (four) times daily as needed (arthritis)., Disp: 400 g, Rfl: prn   Dulaglutide (TRULICITY) 0.75 MG/0.5ML SOPN, Inject 0.75 mg into the skin every 7 (seven) days. Sees Endo, Disp: , Rfl:    escitalopram (LEXAPRO) 20 MG tablet, Take 1 tablet (20 mg total) by mouth daily., Disp: 90 tablet, Rfl: 3   esomeprazole (NEXIUM) 20 MG capsule, Take 1 capsule (20 mg total) by mouth daily at 12 noon., Disp: 90 capsule, Rfl: 3   estradiol (ESTRACE) 0.1 MG/GM vaginal cream, Place 1 Applicatorful vaginally at bedtime. Per OB, Disp: , Rfl:    fluticasone (FLONASE) 50 MCG/ACT  nasal spray, Place 2 sprays into both nostrils daily., Disp: 16 g, Rfl: 6   glucose blood (FREESTYLE LITE) test strip, Use qid to check blood sugar. Dx E11.9, Disp: 400 each, Rfl: 2   insulin aspart (NOVOLOG FLEXPEN) 100 UNIT/ML FlexPen, BG less than 120 - no insulin; 121 to 150 - 3 units; 151 to 200 - 4 units; 201 to 250 - 5 units; 251 to 300 - 6 units; 301-350 - 7 units, 351 or above 8units, Disp: 60 mL, Rfl: PRN   insulin glargine (LANTUS SOLOSTAR) 100 UNIT/ML Solostar Pen, Inject 22 Units into the skin every morning., Disp: 45 mL, Rfl: PRN   Insulin Pen Needle 31G X 5 MM MISC, 1 Device by Does not apply route 4 (four) times daily., Disp: 300 each, Rfl: 4   Multiple Vitamin (MULTIVITAMIN) tablet, Take 1 tablet by mouth daily., Disp: , Rfl:    nystatin powder, APPLY  UNDER BILATERAL BREASTS AND GROIN TWICE A DAY (If needed), Disp: 60 g, Rfl: 1   nystatin-triamcinolone ointment (MYCOLOG), APPLY TOPICALLY 2 (TWO) TIMES DAILY IF NEEDED FOR rash.  NOT A PREVENTATIVE MEDICATION., Disp: 60 g, Rfl: 3   Polyethyl Glycol-Propyl Glycol 0.4-0.3 % SOLN, Apply 1 drop to eye daily as needed (for dry eye relief). , Disp: , Rfl:    polyethylene glycol (MIRALAX / GLYCOLAX) packet, Take 17 g by mouth daily as needed. , Disp: , Rfl:    Probiotic Product (PROBIOTIC ADVANCED PO), Take 1 capsule by mouth daily. , Disp: , Rfl:    traZODone (DESYREL) 100 MG tablet, Take 1 tablet (100 mg total) by mouth at bedtime., Disp: 90 tablet, Rfl: PRN   triamcinolone cream (KENALOG) 0.1 %, Apply 1 Application topically 2 (two) times daily. X7-14 days for rash., Disp: 30 g, Rfl: 0 Social History   Socioeconomic History   Marital status: Widowed    Spouse name: Not on file   Number of children: 3   Years of education: 75   Highest education level: Some college, no degree  Occupational History   Occupation: retired    Associate Professor: RETIRED    Comment: bookeeping  Tobacco Use   Smoking status: Never   Smokeless tobacco: Never  Vaping Use   Vaping status: Never Used  Substance and Sexual Activity   Alcohol use: No   Drug use: No   Sexual activity: Not Currently  Other Topics Concern   Not on file  Social History Narrative   Widowed in 2010; lives alone. Children live out of town   Social Drivers of Health   Financial Resource Strain: Low Risk  (12/20/2023)   Overall Financial Resource Strain (CARDIA)    Difficulty of Paying Living Expenses: Not hard at all  Food Insecurity: No Food Insecurity (12/20/2023)   Hunger Vital Sign    Worried About Running Out of Food in the Last Year: Never true    Ran Out of Food in the Last Year: Never true  Transportation Needs: Unmet Transportation Needs (12/20/2023)   PRAPARE - Transportation    Lack of Transportation (Medical): Yes    Lack of  Transportation (Non-Medical): Yes  Physical Activity: Insufficiently Active (12/20/2023)   Exercise Vital Sign    Days of Exercise per Week: 3 days    Minutes of Exercise per Session: 20 min  Stress: No Stress Concern Present (12/20/2023)   [employer] of Occupational Health - Occupational Stress Questionnaire    Feeling of Stress : Only a little  Social Connections: Moderately  Isolated (12/20/2023)   Social Connection and Isolation Panel [NHANES]    Frequency of Communication with Friends and Family: More than three times a week    Frequency of Social Gatherings with Friends and Family: More than three times a week    Attends Religious Services: More than 4 times per year    Active Member of [employer] or Organizations: No    Attends Banker Meetings: Never    Marital Status: Widowed  Intimate Partner Violence: Not At Risk (09/20/2023)   Received from [facility]   HITS    Over the last 12 months how often did your partner physically hurt you?: Never    Over the last 12 months how often did your partner insult you or talk down to you?: Never    Over the last 12 months how often did your partner threaten you with physical harm?: Never    Over the last 12 months how often did your partner scream or curse at you?: Never   Family History  Problem Relation Age of Onset   Diabetes Mother    Hypertension Mother    Kidney disease Mother    Heart disease Mother    Peripheral vascular disease Mother        amputation   Cancer Father        prostate   Diabetes Father    Heart disease Father        CABG   Hypertension Father    Heart attack Father    Depression Sister    Hypertension Sister    Neuropathy Sister     Objective: Office vital signs reviewed. BP (!) 112/56   Pulse 70   Temp 98.7 F (37.1 C)   Ht 5\' 3"  (1.6 m)   Wt 143 lb 6.4 oz (65 kg)   SpO2 97%   BMI 25.40 kg/m   Physical Examination:  General: Awake, alert, well-appearing, well-nourished  female, No acute distress GU: Rectal exam with signs of previous hemorrhoid as there is a deflated tag at the 6 o'clock position.  She has on the right upper inner gluteal cleft a small BB sized area of soft tissue swelling.  There is minimal surrounding erythema and a punctum without drainage.  Is nontender to touch.  No significant warmth or induration.  Assessment/ Plan: 83 y.o. female   Cyst of buttocks  Suspect that this was an inflamed cyst.  There is no evidence of secondary bacterial infection now that it is decreasing in size I think continued supportive care is fine.  We discussed signs and symptoms that would warrant further evaluation and antibiotic treatment and possible referral to general surgery for excision.  She voiced good understanding will follow-up as needed   [name], DO [facility] [phone]

## 2023-12-26 ENCOUNTER — Encounter: Payer: Self-pay | Admitting: Family Medicine

## 2023-12-26 ENCOUNTER — Encounter (INDEPENDENT_AMBULATORY_CARE_PROVIDER_SITE_OTHER): Admitting: Ophthalmology

## 2023-12-26 ENCOUNTER — Ambulatory Visit (INDEPENDENT_AMBULATORY_CARE_PROVIDER_SITE_OTHER): Admitting: Family Medicine

## 2023-12-26 VITALS — BP 104/60 | HR 82 | Temp 98.4°F | Ht 63.0 in | Wt 141.0 lb

## 2023-12-26 DIAGNOSIS — R6889 Other general symptoms and signs: Secondary | ICD-10-CM | POA: Diagnosis not present

## 2023-12-26 LAB — VERITOR FLU A/B WAIVED
Influenza A: NEGATIVE
Influenza B: NEGATIVE

## 2023-12-26 MED ORDER — BENZONATATE 100 MG PO CAPS
100.0000 mg | ORAL_CAPSULE | Freq: Three times a day (TID) | ORAL | 0 refills | Status: DC | PRN
Start: 2023-12-26 — End: 2024-03-16

## 2023-12-26 MED ORDER — AZELASTINE HCL 0.1 % NA SOLN
1.0000 | Freq: Two times a day (BID) | NASAL | 12 refills | Status: AC
Start: 2023-12-26 — End: ?

## 2023-12-26 NOTE — Progress Notes (Signed)
 Subjective: CC: URI PCP: [name] Ip, DO NWG:[id number] [name] is a 83 y.o. female presenting to clinic today for:  1.  URI Patient reports onset on Saturday of cough, myalgia, congestion.  She reports rhinorrhea.  Has had a little bit of nonbloody diarrhea but no associated nausea, vomiting or belly pain.  She is hydrating without difficulty and utilizing Mucinex as well as OTC nasal sprays.  She has had a little bit of nasal bleeding with a yellow discharge but no purulent discharge and no hemoptysis.  No wheezes or shortness of breath.  Her son was sick with similar over the last few weeks but was not diagnosed with anything.  She has had her flu shot   ROS: Per HPI  Allergies  Allergen Reactions   Latex Anaphylaxis and Rash   Ambien [Zolpidem] Other (See Comments)    Hallucinations   Augmentin [Amoxicillin-Pot Clavulanate] Nausea And Vomiting   Belsomra [Suvorexant] Other (See Comments)    Hallucinations   Hydrocodone-Acetaminophen Other (See Comments)    hallucinations   Hyoscyamine Sulfate Other (See Comments)   Melatonin Other (See Comments)    Adverse reaction-"keeps me awake"   Mobic [Meloxicam]    Morphine Nausea And Vomiting    REACTION: hallucinations,GI upset   Oxycodone Other (See Comments)    Hallucinations   Tramadol     Unknown reaction   Celebrex [Celecoxib] Itching and Rash   Sulfa Antibiotics Rash   Past Medical History:  Diagnosis Date   Acute cystitis    Anxiety    Breast cancer detected by national screening programme (HCC) 03/12/2022   Depression    Diabetes mellitus type II    Diabetic retinopathy    DM type 2 causing CKD stage 3 (HCC)    Fibromyalgia    GERD (gastroesophageal reflux disease)    Hyperlipidemia    Hypertension    IBS (irritable bowel syndrome)    IBS (irritable bowel syndrome)    Insomnia    Lichen planus    vulvar   Pancreatitis, gallstone    Postmenopausal    Radial scar of breast 06/04/2016   Urge incontinence of  urine     Current Outpatient Medications:    acetaminophen (TYLENOL) 325 MG tablet, Take 325 mg by mouth as needed., Disp: , Rfl:    amLODipine (NORVASC) 5 MG tablet, Take 1 tablet (5 mg total) by mouth daily., Disp: 90 tablet, Rfl: 3   aspirin 81 MG tablet, Take 81 mg by mouth every other day., Disp: , Rfl:    atorvastatin (LIPITOR) 10 MG tablet, Take 1 tablet (10 mg total) by mouth at bedtime., Disp: 90 tablet, Rfl: 1   BESIVANCE 0.6 % SUSP, Apply to eye., Disp: , Rfl:    Calcium Carb-Cholecalciferol 600-200 MG-UNIT TABS, Take 1 tablet by mouth 2 (two) times daily., Disp: , Rfl:    clobetasol (TEMOVATE) 0.05 % GEL, Apply topically daily., Disp: , Rfl:    desonide (DESOWEN) 0.05 % cream, Apply topically 2 (two) times daily., Disp: 30 g, Rfl: 1   diclofenac Sodium (VOLTAREN) 1 % GEL, Apply 4 g topically 4 (four) times daily as needed (arthritis)., Disp: 400 g, Rfl: prn   Dulaglutide (TRULICITY) 0.75 MG/0.5ML SOPN, Inject 0.75 mg into the skin every 7 (seven) days. Sees Endo, Disp: , Rfl:    escitalopram (LEXAPRO) 20 MG tablet, Take 1 tablet (20 mg total) by mouth daily., Disp: 90 tablet, Rfl: 3   esomeprazole (NEXIUM) 20 MG capsule, Take 1  capsule (20 mg total) by mouth daily at 12 noon., Disp: 90 capsule, Rfl: 3   estradiol (ESTRACE) 0.1 MG/GM vaginal cream, Place 1 Applicatorful vaginally at bedtime. Per OB, Disp: , Rfl:    fluticasone (FLONASE) 50 MCG/ACT nasal spray, Place 2 sprays into both nostrils daily., Disp: 16 g, Rfl: 6   glucose blood (FREESTYLE LITE) test strip, Use qid to check blood sugar. Dx E11.9, Disp: 400 each, Rfl: 2   insulin aspart (NOVOLOG FLEXPEN) 100 UNIT/ML FlexPen, BG less than 120 - no insulin; 121 to 150 - 3 units; 151 to 200 - 4 units; 201 to 250 - 5 units; 251 to 300 - 6 units; 301-350 - 7 units, 351 or above 8units, Disp: 60 mL, Rfl: PRN   insulin glargine (LANTUS SOLOSTAR) 100 UNIT/ML Solostar Pen, Inject 22 Units into the skin every morning., Disp: 45 mL, Rfl:  PRN   Insulin Pen Needle 31G X 5 MM MISC, 1 Device by Does not apply route 4 (four) times daily., Disp: 300 each, Rfl: 4   Multiple Vitamin (MULTIVITAMIN) tablet, Take 1 tablet by mouth daily., Disp: , Rfl:    nystatin powder, APPLY UNDER BILATERAL BREASTS AND GROIN TWICE A DAY (If needed), Disp: 60 g, Rfl: 1   nystatin-triamcinolone ointment (MYCOLOG), APPLY TOPICALLY 2 (TWO) TIMES DAILY IF NEEDED FOR rash.  NOT A PREVENTATIVE MEDICATION., Disp: 60 g, Rfl: 3   Polyethyl Glycol-Propyl Glycol 0.4-0.3 % SOLN, Apply 1 drop to eye daily as needed (for dry eye relief). , Disp: , Rfl:    polyethylene glycol (MIRALAX / GLYCOLAX) packet, Take 17 g by mouth daily as needed. , Disp: , Rfl:    Probiotic Product (PROBIOTIC ADVANCED PO), Take 1 capsule by mouth daily. , Disp: , Rfl:    traZODone (DESYREL) 100 MG tablet, Take 1 tablet (100 mg total) by mouth at bedtime., Disp: 90 tablet, Rfl: PRN   triamcinolone cream (KENALOG) 0.1 %, Apply 1 Application topically 2 (two) times daily. X7-14 days for rash., Disp: 30 g, Rfl: 0 Social History   Socioeconomic History   Marital status: Widowed    Spouse name: Not on file   Number of children: 3   Years of education: 43   Highest education level: Some college, no degree  Occupational History   Occupation: retired    Associate Professor: RETIRED    Comment: bookeeping  Tobacco Use   Smoking status: Never   Smokeless tobacco: Never  Vaping Use   Vaping status: Never Used  Substance and Sexual Activity   Alcohol use: No   Drug use: No   Sexual activity: Not Currently  Other Topics Concern   Not on file  Social History Narrative   Widowed in 2010; lives alone. Children live out of town   Social Drivers of Health   Financial Resource Strain: Low Risk  (12/20/2023)   Overall Financial Resource Strain (CARDIA)    Difficulty of Paying Living Expenses: Not hard at all  Food Insecurity: No Food Insecurity (12/20/2023)   Hunger Vital Sign    Worried About Running Out  of Food in the Last Year: Never true    Ran Out of Food in the Last Year: Never true  Transportation Needs: Unmet Transportation Needs (12/20/2023)   PRAPARE - Transportation    Lack of Transportation (Medical): Yes    Lack of Transportation (Non-Medical): Yes  Physical Activity: Insufficiently Active (12/20/2023)   Exercise Vital Sign    Days of Exercise per Week: 3 days  Minutes of Exercise per Session: 20 min  Stress: No Stress Concern Present (12/20/2023)   [employer] of Occupational Health - Occupational Stress Questionnaire    Feeling of Stress : Only a little  Social Connections: Moderately Isolated (12/20/2023)   Social Connection and Isolation Panel [NHANES]    Frequency of Communication with Friends and Family: More than three times a week    Frequency of Social Gatherings with Friends and Family: More than three times a week    Attends Religious Services: More than 4 times per year    Active Member of [employer] or Organizations: No    Attends Banker Meetings: Never    Marital Status: Widowed  Intimate Partner Violence: Not At Risk (09/20/2023)   Received from [facility]   HITS    Over the last 12 months how often did your partner physically hurt you?: Never    Over the last 12 months how often did your partner insult you or talk down to you?: Never    Over the last 12 months how often did your partner threaten you with physical harm?: Never    Over the last 12 months how often did your partner scream or curse at you?: Never   Family History  Problem Relation Age of Onset   Diabetes Mother    Hypertension Mother    Kidney disease Mother    Heart disease Mother    Peripheral vascular disease Mother        amputation   Cancer Father        prostate   Diabetes Father    Heart disease Father        CABG   Hypertension Father    Heart attack Father    Depression Sister    Hypertension Sister    Neuropathy Sister     Objective: Office vital  signs reviewed. BP 104/60   Pulse 82   Temp 98.4 F (36.9 [name])   Ht 5\' 3"  (1.6 m)   Wt 141 lb (64 kg)   SpO2 99%   BMI 24.98 kg/m   Physical Examination:  General: Awake, alert, well nourished, nontoxic female.  No acute distress HEENT: Normal    Neck: No masses palpated. No lymphadenopathy    Ears: Tympanic membranes intact, normal light reflex, no erythema, no bulging    Eyes: PERRLA, extraocular membranes intact, sclera whit    Nose: nasal turbinates moist, clear nasal discharge    Throat: moist mucus membranes, no erythema, no tonsillar exudate.  Airway is patent Cardio: regular rate and rhythm, S1S2 heard, no murmurs appreciated Pulm: clear to auscultation bilaterally, no wheezes, rhonchi or rales; normal work of breathing on room air  Assessment/ Plan: 83 y.o. female   Flu-like symptoms - Plan: Veritor Flu A/B Waived, Novel Coronavirus, NAA ([facility]), benzonatate (TESSALON PERLES) 100 MG capsule, azelastine (ASTELIN) 0.1 % nasal spray  Negative rapid flu.  COVID pending.  Tessalon Perles and nasal spray sent to pharmacy for symptomatic relief.  Appears to be a cold at this point and handout provided with recommendations.  We discussed reasons for reevaluation and she voiced good understanding will follow up as needed   [name] Ip, DO [facility] [phone]

## 2023-12-26 NOTE — Patient Instructions (Signed)

## 2023-12-27 LAB — NOVEL CORONAVIRUS, NAA: SARS-CoV-2, NAA: NOT DETECTED

## 2023-12-30 ENCOUNTER — Encounter (INDEPENDENT_AMBULATORY_CARE_PROVIDER_SITE_OTHER): Admitting: Ophthalmology

## 2024-01-02 DIAGNOSIS — E1140 Type 2 diabetes mellitus with diabetic neuropathy, unspecified: Secondary | ICD-10-CM | POA: Diagnosis not present

## 2024-01-04 ENCOUNTER — Encounter (INDEPENDENT_AMBULATORY_CARE_PROVIDER_SITE_OTHER): Admitting: Ophthalmology

## 2024-01-04 DIAGNOSIS — H35033 Hypertensive retinopathy, bilateral: Secondary | ICD-10-CM

## 2024-01-04 DIAGNOSIS — Z7984 Long term (current) use of oral hypoglycemic drugs: Secondary | ICD-10-CM

## 2024-01-04 DIAGNOSIS — H43813 Vitreous degeneration, bilateral: Secondary | ICD-10-CM

## 2024-01-04 DIAGNOSIS — I10 Essential (primary) hypertension: Secondary | ICD-10-CM | POA: Diagnosis not present

## 2024-01-04 DIAGNOSIS — E113312 Type 2 diabetes mellitus with moderate nonproliferative diabetic retinopathy with macular edema, left eye: Secondary | ICD-10-CM

## 2024-01-04 DIAGNOSIS — E113391 Type 2 diabetes mellitus with moderate nonproliferative diabetic retinopathy without macular edema, right eye: Secondary | ICD-10-CM

## 2024-01-11 DIAGNOSIS — L439 Lichen planus, unspecified: Secondary | ICD-10-CM | POA: Diagnosis not present

## 2024-01-11 DIAGNOSIS — D696 Thrombocytopenia, unspecified: Secondary | ICD-10-CM | POA: Diagnosis not present

## 2024-01-11 DIAGNOSIS — Z170 Estrogen receptor positive status [ER+]: Secondary | ICD-10-CM | POA: Diagnosis not present

## 2024-01-11 DIAGNOSIS — C50511 Malignant neoplasm of lower-outer quadrant of right female breast: Secondary | ICD-10-CM | POA: Diagnosis not present

## 2024-01-16 ENCOUNTER — Other Ambulatory Visit: Payer: Self-pay | Admitting: Family Medicine

## 2024-01-16 DIAGNOSIS — E1122 Type 2 diabetes mellitus with diabetic chronic kidney disease: Secondary | ICD-10-CM

## 2024-01-16 MED ORDER — NOVOLOG FLEXPEN 100 UNIT/ML ~~LOC~~ SOPN
PEN_INJECTOR | SUBCUTANEOUS | 99 refills | Status: DC
Start: 2024-01-16 — End: 2024-02-28

## 2024-01-16 NOTE — Telephone Encounter (Signed)
 Copied from CRM [id number]. Topic: Clinical - Medication Refill >> Jan 16, 2024 10:06 AM Tiffany S wrote: Most Recent Primary Care Visit:   Medication: insulin aspart (NOVOLOG FLEXPEN) 100 UNIT/ML FlexPen [[id number]]  Has the patient contacted their pharmacy? Yes (Agent: If no, request that the patient contact the pharmacy for the refill. If patient does not wish to contact the pharmacy document the reason why and proceed with request.) (Agent: If yes, when and what did the pharmacy advise?)  Is this the correct pharmacy for this prescription? Yes If no, delete pharmacy and type the correct one.  This is the patient's preferred pharmacy:   [facility] [place], [place] - [address] [place] [address] Phone: [phone] Fax: [phone]   Has the prescription been filled recently? Yes  Is the patient out of the medication? Yes  Has the patient been seen for an appointment in the last year OR does the patient have an upcoming appointment? Yes  Can we respond through MyChart? Yes  Agent: Please be advised that Rx refills may take up to 3 business days. We ask that you follow-up with your pharmacy.  Patient stated she only needs 1 box and send it to [facility]

## 2024-01-25 ENCOUNTER — Encounter: Payer: Self-pay | Admitting: Family Medicine

## 2024-01-25 ENCOUNTER — Ambulatory Visit (INDEPENDENT_AMBULATORY_CARE_PROVIDER_SITE_OTHER): Admitting: Family Medicine

## 2024-01-25 VITALS — BP 119/61 | HR 67 | Temp 98.3°F | Ht 63.0 in | Wt 141.8 lb

## 2024-01-25 DIAGNOSIS — E785 Hyperlipidemia, unspecified: Secondary | ICD-10-CM

## 2024-01-25 DIAGNOSIS — E1169 Type 2 diabetes mellitus with other specified complication: Secondary | ICD-10-CM | POA: Diagnosis not present

## 2024-01-25 DIAGNOSIS — Z794 Long term (current) use of insulin: Secondary | ICD-10-CM | POA: Diagnosis not present

## 2024-01-25 DIAGNOSIS — I152 Hypertension secondary to endocrine disorders: Secondary | ICD-10-CM | POA: Diagnosis not present

## 2024-01-25 DIAGNOSIS — N761 Subacute and chronic vaginitis: Secondary | ICD-10-CM | POA: Diagnosis not present

## 2024-01-25 DIAGNOSIS — N1831 Chronic kidney disease, stage 3a: Secondary | ICD-10-CM

## 2024-01-25 DIAGNOSIS — E1159 Type 2 diabetes mellitus with other circulatory complications: Secondary | ICD-10-CM

## 2024-01-25 DIAGNOSIS — E1122 Type 2 diabetes mellitus with diabetic chronic kidney disease: Secondary | ICD-10-CM

## 2024-01-25 LAB — BAYER DCA HB A1C WAIVED: HB A1C (BAYER DCA - WAIVED): 6.3 % — ABNORMAL HIGH (ref 4.8–5.6)

## 2024-01-25 MED ORDER — DESONIDE 0.05 % EX CREA: TOPICAL_CREAM | Freq: Two times a day (BID) | CUTANEOUS | 1 refills | Status: AC

## 2024-01-25 NOTE — Progress Notes (Signed)
 Subjective: CC:DM PCP: [name], DO ZOX:[id number] C Santacroce is a 83 y.o. female presenting to clinic today for:  1. Type 2 Diabetes with hypertension, hyperlipidemia w/ CKD3a:  Currently utilizing the freestyle libre 2.  Has not had any lows below 70 and her average highs around 250.  Her next appoint with Dr. Morrison Old, her endocrinologist, is at the end of May.  She is compliant with 20 units of Lantus daily and sliding scale NovoLog.  She is also on Trulicity 0.75 mg weekly.  Compliant with amlodipine, Lipitor.  Diabetes Health Maintenance Due  Topic Date Due   OPHTHALMOLOGY EXAM  07/07/2023   HEMOGLOBIN A1C  01/24/2024   FOOT EXAM  09/20/2024    Last A1c:  Lab Results  Component Value Date   HGBA1C 6.2 (H) 07/26/2023    ROS: Denies dizziness, LOC, polyuria, polydipsia, unintended weight loss/gain, foot ulcerations, numbness or tingling in extremities, shortness of breath or chest pain.   ROS: Per HPI  Allergies  Allergen Reactions   Latex Anaphylaxis and Rash   Ambien [Zolpidem] Other (See Comments)    Hallucinations   Augmentin [Amoxicillin-Pot Clavulanate] Nausea And Vomiting   Belsomra [Suvorexant] Other (See Comments)    Hallucinations   Hydrocodone-Acetaminophen Other (See Comments)    hallucinations   Hyoscyamine Sulfate Other (See Comments)   Melatonin Other (See Comments)    Adverse reaction-"keeps me awake"   Mobic [Meloxicam]    Morphine Nausea And Vomiting    REACTION: hallucinations,GI upset   Oxycodone Other (See Comments)    Hallucinations   Tramadol     Unknown reaction   Celebrex [Celecoxib] Itching and Rash   Sulfa Antibiotics Rash   Past Medical History:  Diagnosis Date   Acute cystitis    Anxiety    Breast cancer detected by national screening programme (HCC) 03/12/2022   Depression    Diabetes mellitus type II    Diabetic retinopathy    DM type 2 causing CKD stage 3 (HCC)    Fibromyalgia    GERD (gastroesophageal reflux disease)     Hyperlipidemia    Hypertension    IBS (irritable bowel syndrome)    IBS (irritable bowel syndrome)    Insomnia    Lichen planus    vulvar   Pancreatitis, gallstone    Postmenopausal    Radial scar of breast 06/04/2016   Urge incontinence of urine     Current Outpatient Medications:    acetaminophen (TYLENOL) 325 MG tablet, Take 325 mg by mouth as needed., Disp: , Rfl:    amLODipine (NORVASC) 5 MG tablet, Take 1 tablet (5 mg total) by mouth daily., Disp: 90 tablet, Rfl: 3   aspirin 81 MG tablet, Take 81 mg by mouth every other day., Disp: , Rfl:    atorvastatin (LIPITOR) 10 MG tablet, Take 1 tablet (10 mg total) by mouth at bedtime., Disp: 90 tablet, Rfl: 1   azelastine (ASTELIN) 0.1 % nasal spray, Place 1 spray into both nostrils 2 (two) times daily. Use with flonase for nasal congestion/ drainage, Disp: 30 mL, Rfl: 12   benzonatate (TESSALON PERLES) 100 MG capsule, Take 1 capsule (100 mg total) by mouth 3 (three) times daily as needed., Disp: 20 capsule, Rfl: 0   BESIVANCE 0.6 % SUSP, Apply to eye., Disp: , Rfl:    Calcium Carb-Cholecalciferol 600-200 MG-UNIT TABS, Take 1 tablet by mouth 2 (two) times daily., Disp: , Rfl:    clobetasol (TEMOVATE) 0.05 % GEL, Apply topically daily., Disp: ,  Rfl:    desonide (DESOWEN) 0.05 % cream, Apply topically 2 (two) times daily., Disp: 30 g, Rfl: 1   diclofenac Sodium (VOLTAREN) 1 % GEL, Apply 4 g topically 4 (four) times daily as needed (arthritis)., Disp: 400 g, Rfl: prn   Dulaglutide (TRULICITY) 0.75 MG/0.5ML SOPN, Inject 0.75 mg into the skin every 7 (seven) days. Sees Endo, Disp: , Rfl:    escitalopram (LEXAPRO) 20 MG tablet, Take 1 tablet (20 mg total) by mouth daily., Disp: 90 tablet, Rfl: 3   esomeprazole (NEXIUM) 20 MG capsule, Take 1 capsule (20 mg total) by mouth daily at 12 noon., Disp: 90 capsule, Rfl: 3   estradiol (ESTRACE) 0.1 MG/GM vaginal cream, Place 1 Applicatorful vaginally at bedtime. Per OB, Disp: , Rfl:    fluticasone  (FLONASE) 50 MCG/ACT nasal spray, Place 2 sprays into both nostrils daily., Disp: 16 g, Rfl: 6   glucose blood (FREESTYLE LITE) test strip, Use qid to check blood sugar. Dx E11.9, Disp: 400 each, Rfl: 2   insulin aspart (NOVOLOG FLEXPEN) 100 UNIT/ML FlexPen, BG less than 120 - no insulin; 121 to 150 - 3 units; 151 to 200 - 4 units; 201 to 250 - 5 units; 251 to 300 - 6 units; 301-350 - 7 units, 351 or above 8units, Disp: 60 mL, Rfl: PRN   insulin glargine (LANTUS SOLOSTAR) 100 UNIT/ML Solostar Pen, Inject 22 Units into the skin every morning., Disp: 45 mL, Rfl: PRN   Insulin Pen Needle 31G X 5 MM MISC, 1 Device by Does not apply route 4 (four) times daily., Disp: 300 each, Rfl: 4   Multiple Vitamin (MULTIVITAMIN) tablet, Take 1 tablet by mouth daily., Disp: , Rfl:    nystatin powder, APPLY UNDER BILATERAL BREASTS AND GROIN TWICE A DAY (If needed), Disp: 60 g, Rfl: 1   nystatin-triamcinolone ointment (MYCOLOG), APPLY TOPICALLY 2 (TWO) TIMES DAILY IF NEEDED FOR rash.  NOT A PREVENTATIVE MEDICATION., Disp: 60 g, Rfl: 3   Polyethyl Glycol-Propyl Glycol 0.4-0.3 % SOLN, Apply 1 drop to eye daily as needed (for dry eye relief). , Disp: , Rfl:    polyethylene glycol (MIRALAX / GLYCOLAX) packet, Take 17 g by mouth daily as needed. , Disp: , Rfl:    Probiotic Product (PROBIOTIC ADVANCED PO), Take 1 capsule by mouth daily. , Disp: , Rfl:    traZODone (DESYREL) 100 MG tablet, Take 1 tablet (100 mg total) by mouth at bedtime., Disp: 90 tablet, Rfl: PRN   triamcinolone cream (KENALOG) 0.1 %, Apply 1 Application topically 2 (two) times daily. X7-14 days for rash., Disp: 30 g, Rfl: 0 Social History   Socioeconomic History   Marital status: Widowed    Spouse name: Not on file   Number of children: 3   Years of education: 13   Highest education level: Some college, no degree  Occupational History   Occupation: retired    Associate Professor: RETIRED    Comment: bookeeping  Tobacco Use   Smoking status: Never    Smokeless tobacco: Never  Vaping Use   Vaping status: Never Used  Substance and Sexual Activity   Alcohol use: No   Drug use: No   Sexual activity: Not Currently  Other Topics Concern   Not on file  Social History Narrative   Widowed in 2010; lives alone. Children live out of town   Social Drivers of [employer]: Low Risk  (01/11/2024)   Received from [facility]   Overall [employer] (  CARDIA)    Difficulty of Paying Living Expenses: Not very hard  Food Insecurity: No Food Insecurity (01/11/2024)   Received from [facility]   Hunger Vital Sign    Worried About Running Out of Food in the Last Year: Never true    Ran Out of Food in the Last Year: Never true  Transportation Needs: No Transportation Needs (01/11/2024)   Received from [facility] - Transportation    Lack of Transportation (Medical): No    Lack of Transportation (Non-Medical): No  Recent Concern: Transportation Needs - Unmet Transportation Needs (12/20/2023)   PRAPARE - Transportation    Lack of Transportation (Medical): Yes    Lack of Transportation (Non-Medical): Yes  Physical Activity: Insufficiently Active (12/20/2023)   Exercise Vital Sign    Days of Exercise per Week: 3 days    Minutes of Exercise per Session: 20 min  Stress: No Stress Concern Present (12/20/2023)   [employer] of Occupational Health - Occupational Stress Questionnaire    Feeling of Stress : Only a little  Social Connections: Moderately Isolated (12/20/2023)   Social Connection and Isolation Panel [NHANES]    Frequency of Communication with Friends and Family: More than three times a week    Frequency of Social Gatherings with Friends and Family: More than three times a week    Attends Religious Services: More than 4 times per year    Active Member of [employer] or Organizations: No    Attends Banker Meetings: Never    Marital Status: Widowed  Intimate Partner Violence: Not At  Risk (09/20/2023)   Received from [facility]   HITS    Over the last 12 months how often did your partner physically hurt you?: Never    Over the last 12 months how often did your partner insult you or talk down to you?: Never    Over the last 12 months how often did your partner threaten you with physical harm?: Never    Over the last 12 months how often did your partner scream or curse at you?: Never   Family History  Problem Relation Age of Onset   Diabetes Mother    Hypertension Mother    Kidney disease Mother    Heart disease Mother    Peripheral vascular disease Mother        amputation   Cancer Father        prostate   Diabetes Father    Heart disease Father        CABG   Hypertension Father    Heart attack Father    Depression Sister    Hypertension Sister    Neuropathy Sister     Objective: Office vital signs reviewed. BP 119/61   Pulse 67   Temp 98.3 F (36.8 C)   Ht 5\' 3"  (1.6 m)   SpO2 99%   BMI 24.98 kg/m   Physical Examination:  General: Awake, alert, well nourished, No acute distress HEENT: sclera white, MMM Cardio: regular rate and rhythm, S1S2 heard, no murmurs appreciated Pulm: clear to auscultation bilaterally, no wheezes, rhonchi or rales; normal work of breathing on room air  Assessment/ Plan: 83 y.o. female   Type 2 diabetes mellitus with stage 3a chronic kidney disease, with long-term current use of insulin (HCC) - Plan: Microalbumin / creatinine urine ratio, Renal Function Panel, Bayer DCA Hb A1c Waived  Hypertension associated with diabetes (HCC)  Dyslipidemia associated with type 2 diabetes mellitus (HCC)  Chronic  vaginitis - Plan: desonide (DESOWEN) 0.05 % cream  Check A1c, renal function panel and urine microalbumin.  Will CC results to her endocrinologist, whom she has an appointment with in about 2 months.  She is to continue all medications as prescribed.  Her blood pressure was controlled and she is not due for fasting  lipid panel but will plan for this at next visit in 6 months  Did not discuss chronic vaginitis but needed refill on DesOwen so this was sent  [name], DO [facility] [phone]

## 2024-01-26 LAB — RENAL FUNCTION PANEL
Albumin: 3.9 g/dL (ref 3.7–4.7)
BUN/Creatinine Ratio: 21 (ref 12–28)
BUN: 22 mg/dL (ref 8–27)
CO2: 28 mmol/L (ref 20–29)
Calcium: 9.8 mg/dL (ref 8.7–10.3)
Chloride: 100 mmol/L (ref 96–106)
Creatinine, Ser: 1.04 mg/dL — ABNORMAL HIGH (ref 0.57–1.00)
Glucose: 108 mg/dL — ABNORMAL HIGH (ref 70–99)
Phosphorus: 3.5 mg/dL (ref 3.0–4.3)
Potassium: 4.4 mmol/L (ref 3.5–5.2)
Sodium: 141 mmol/L (ref 134–144)
eGFR: 53 mL/min/{1.73_m2} — ABNORMAL LOW (ref >59)

## 2024-01-26 LAB — MICROALBUMIN / CREATININE URINE RATIO
Creatinine, Urine: 225.4 mg/dL
Microalb/Creat Ratio: 6 mg/g{creat} (ref 0–29)
Microalbumin, Urine: 14.1 ug/mL

## 2024-01-27 ENCOUNTER — Encounter: Payer: Self-pay | Admitting: Family Medicine

## 2024-02-01 DIAGNOSIS — L0100 Impetigo, unspecified: Secondary | ICD-10-CM | POA: Diagnosis not present

## 2024-02-01 DIAGNOSIS — L98499 Non-pressure chronic ulcer of skin of other sites with unspecified severity: Secondary | ICD-10-CM | POA: Diagnosis not present

## 2024-02-01 DIAGNOSIS — D485 Neoplasm of uncertain behavior of skin: Secondary | ICD-10-CM | POA: Diagnosis not present

## 2024-02-13 ENCOUNTER — Ambulatory Visit: Admitting: Audiologist

## 2024-02-14 DIAGNOSIS — M79675 Pain in left toe(s): Secondary | ICD-10-CM | POA: Diagnosis not present

## 2024-02-14 DIAGNOSIS — L84 Corns and callosities: Secondary | ICD-10-CM | POA: Diagnosis not present

## 2024-02-14 DIAGNOSIS — E1142 Type 2 diabetes mellitus with diabetic polyneuropathy: Secondary | ICD-10-CM | POA: Diagnosis not present

## 2024-02-14 DIAGNOSIS — M79674 Pain in right toe(s): Secondary | ICD-10-CM | POA: Diagnosis not present

## 2024-02-14 DIAGNOSIS — B351 Tinea unguium: Secondary | ICD-10-CM | POA: Diagnosis not present

## 2024-02-15 DIAGNOSIS — S41102A Unspecified open wound of left upper arm, initial encounter: Secondary | ICD-10-CM | POA: Diagnosis not present

## 2024-02-15 DIAGNOSIS — L928 Other granulomatous disorders of the skin and subcutaneous tissue: Secondary | ICD-10-CM | POA: Diagnosis not present

## 2024-02-20 DIAGNOSIS — L928 Other granulomatous disorders of the skin and subcutaneous tissue: Secondary | ICD-10-CM | POA: Diagnosis not present

## 2024-02-22 DIAGNOSIS — Z08 Encounter for follow-up examination after completed treatment for malignant neoplasm: Secondary | ICD-10-CM | POA: Diagnosis not present

## 2024-02-22 DIAGNOSIS — Z853 Personal history of malignant neoplasm of breast: Secondary | ICD-10-CM | POA: Diagnosis not present

## 2024-02-22 DIAGNOSIS — R922 Inconclusive mammogram: Secondary | ICD-10-CM | POA: Diagnosis not present

## 2024-02-22 DIAGNOSIS — R92333 Mammographic heterogeneous density, bilateral breasts: Secondary | ICD-10-CM | POA: Diagnosis not present

## 2024-02-27 DIAGNOSIS — L928 Other granulomatous disorders of the skin and subcutaneous tissue: Secondary | ICD-10-CM | POA: Diagnosis not present

## 2024-02-28 ENCOUNTER — Other Ambulatory Visit: Payer: Self-pay | Admitting: Family Medicine

## 2024-02-28 DIAGNOSIS — Z794 Long term (current) use of insulin: Secondary | ICD-10-CM

## 2024-02-28 MED ORDER — NOVOLOG FLEXPEN 100 UNIT/ML ~~LOC~~ SOPN: PEN_INJECTOR | SUBCUTANEOUS | 99 refills | Status: AC

## 2024-02-28 NOTE — Telephone Encounter (Signed)
 Copied from CRM [id number]. Topic: Clinical - Medication Refill >> Feb 28, 2024  9:14 AM Essie A wrote: Most Recent Primary Care Visit:   Medication: insulin  aspart (NOVOLOG  FLEXPEN) 100 UNIT/ML FlexPen Need 3 month supply with 3 refills  Has the patient contacted their pharmacy? No This prescription has to go through [employer] (Agent: If no, request that the patient contact the pharmacy for the refill. If patient does not wish to contact the pharmacy document the reason why and proceed with request.) (Agent: If yes, when and what did the pharmacy advise?)  Is this the correct pharmacy for this prescription? Yes If no, delete pharmacy and type the correct one.  This is the patient's preferred pharmacy:  [facility] - [place], [place] - [address] [place] [address] Phone: [phone] Fax: [phone]  Has the prescription been filled recently? No  Is the patient out of the medication? Yes  Has the patient been seen for an appointment in the last year OR does the patient have an upcoming appointment? Yes  Can we respond through MyChart? Yes  Agent: Please be advised that Rx refills may take up to 3 business days. We ask that you follow-up with your pharmacy.

## 2024-02-29 ENCOUNTER — Encounter (INDEPENDENT_AMBULATORY_CARE_PROVIDER_SITE_OTHER): Admitting: Ophthalmology

## 2024-03-06 ENCOUNTER — Encounter (INDEPENDENT_AMBULATORY_CARE_PROVIDER_SITE_OTHER): Admitting: Ophthalmology

## 2024-03-06 DIAGNOSIS — E113392 Type 2 diabetes mellitus with moderate nonproliferative diabetic retinopathy without macular edema, left eye: Secondary | ICD-10-CM

## 2024-03-06 DIAGNOSIS — E113311 Type 2 diabetes mellitus with moderate nonproliferative diabetic retinopathy with macular edema, right eye: Secondary | ICD-10-CM | POA: Diagnosis not present

## 2024-03-06 DIAGNOSIS — I10 Essential (primary) hypertension: Secondary | ICD-10-CM

## 2024-03-06 DIAGNOSIS — H43813 Vitreous degeneration, bilateral: Secondary | ICD-10-CM | POA: Diagnosis not present

## 2024-03-06 DIAGNOSIS — H35033 Hypertensive retinopathy, bilateral: Secondary | ICD-10-CM | POA: Diagnosis not present

## 2024-03-08 DIAGNOSIS — L928 Other granulomatous disorders of the skin and subcutaneous tissue: Secondary | ICD-10-CM | POA: Diagnosis not present

## 2024-03-16 ENCOUNTER — Other Ambulatory Visit: Payer: Self-pay | Admitting: Family Medicine

## 2024-03-16 DIAGNOSIS — R6889 Other general symptoms and signs: Secondary | ICD-10-CM

## 2024-03-21 DIAGNOSIS — N1831 Chronic kidney disease, stage 3a: Secondary | ICD-10-CM | POA: Diagnosis not present

## 2024-03-21 DIAGNOSIS — Z794 Long term (current) use of insulin: Secondary | ICD-10-CM | POA: Diagnosis not present

## 2024-03-21 DIAGNOSIS — E1140 Type 2 diabetes mellitus with diabetic neuropathy, unspecified: Secondary | ICD-10-CM | POA: Diagnosis not present

## 2024-04-01 DIAGNOSIS — E1140 Type 2 diabetes mellitus with diabetic neuropathy, unspecified: Secondary | ICD-10-CM | POA: Diagnosis not present

## 2024-04-02 DIAGNOSIS — L900 Lichen sclerosus et atrophicus: Secondary | ICD-10-CM | POA: Diagnosis not present

## 2024-04-02 DIAGNOSIS — N909 Noninflammatory disorder of vulva and perineum, unspecified: Secondary | ICD-10-CM | POA: Diagnosis not present

## 2024-04-02 DIAGNOSIS — Z853 Personal history of malignant neoplasm of breast: Secondary | ICD-10-CM | POA: Diagnosis not present

## 2024-04-02 DIAGNOSIS — Z01411 Encounter for gynecological examination (general) (routine) with abnormal findings: Secondary | ICD-10-CM | POA: Diagnosis not present

## 2024-04-03 DIAGNOSIS — T148XXA Other injury of unspecified body region, initial encounter: Secondary | ICD-10-CM | POA: Diagnosis not present

## 2024-04-03 DIAGNOSIS — L98491 Non-pressure chronic ulcer of skin of other sites limited to breakdown of skin: Secondary | ICD-10-CM | POA: Diagnosis not present

## 2024-04-17 DIAGNOSIS — L810 Postinflammatory hyperpigmentation: Secondary | ICD-10-CM | POA: Diagnosis not present

## 2024-04-17 DIAGNOSIS — L928 Other granulomatous disorders of the skin and subcutaneous tissue: Secondary | ICD-10-CM | POA: Diagnosis not present

## 2024-04-30 ENCOUNTER — Encounter

## 2024-05-02 ENCOUNTER — Encounter (INDEPENDENT_AMBULATORY_CARE_PROVIDER_SITE_OTHER): Admitting: Ophthalmology

## 2024-05-04 ENCOUNTER — Encounter (INDEPENDENT_AMBULATORY_CARE_PROVIDER_SITE_OTHER): Admitting: Ophthalmology

## 2024-05-04 DIAGNOSIS — I10 Essential (primary) hypertension: Secondary | ICD-10-CM | POA: Diagnosis not present

## 2024-05-04 DIAGNOSIS — H43813 Vitreous degeneration, bilateral: Secondary | ICD-10-CM | POA: Diagnosis not present

## 2024-05-04 DIAGNOSIS — H35033 Hypertensive retinopathy, bilateral: Secondary | ICD-10-CM

## 2024-05-04 DIAGNOSIS — E113311 Type 2 diabetes mellitus with moderate nonproliferative diabetic retinopathy with macular edema, right eye: Secondary | ICD-10-CM

## 2024-05-04 DIAGNOSIS — E113392 Type 2 diabetes mellitus with moderate nonproliferative diabetic retinopathy without macular edema, left eye: Secondary | ICD-10-CM | POA: Diagnosis not present

## 2024-05-04 DIAGNOSIS — Z7984 Long term (current) use of oral hypoglycemic drugs: Secondary | ICD-10-CM | POA: Diagnosis not present

## 2024-05-10 ENCOUNTER — Telehealth: Payer: Self-pay | Admitting: Family Medicine

## 2024-05-10 DIAGNOSIS — J302 Other seasonal allergic rhinitis: Secondary | ICD-10-CM

## 2024-05-10 DIAGNOSIS — H6993 Unspecified Eustachian tube disorder, bilateral: Secondary | ICD-10-CM

## 2024-05-10 DIAGNOSIS — H9193 Unspecified hearing loss, bilateral: Secondary | ICD-10-CM

## 2024-05-10 NOTE — Telephone Encounter (Signed)
 Attempted to call re: ENT referral. Not sure what exactly it was for but I associated with hearing/ eustachian tube dysfunction and chronic sinusitis. If she has something else going on, let me know and I will add dx.  Orders Placed This Encounter  Procedures   Ambulatory referral to ENT

## 2024-05-15 ENCOUNTER — Other Ambulatory Visit: Payer: Self-pay | Admitting: Family Medicine

## 2024-05-15 DIAGNOSIS — E1169 Type 2 diabetes mellitus with other specified complication: Secondary | ICD-10-CM

## 2024-05-15 MED ORDER — ATORVASTATIN CALCIUM 10 MG PO TABS: 10.0000 mg | ORAL_TABLET | Freq: Every day | ORAL | 1 refills | Status: DC

## 2024-05-15 NOTE — Telephone Encounter (Signed)
 Copied from CRM [id number]. Topic: Clinical - Medication Refill >> May 15, 2024 11:38 AM Myrick T wrote: Medication: atorvastatin  (LIPITOR) 10 MG tablet  Has the patient contacted their pharmacy? No  This is the patient's preferred pharmacy:  [facility] - [place], [place] - [address] [place] [address] Phone: [phone] Fax: [phone]  Is this the correct pharmacy for this prescription? Yes  Has the prescription been filled recently? Yes  Is the patient out of the medication? No  Has the patient been seen for an appointment in the last year OR does the patient have an upcoming appointment? Yes  Can we respond through MyChart? Yes  Agent: Please be advised that Rx refills may take up to 3 business days. We ask that you follow-up with your pharmacy.

## 2024-05-30 ENCOUNTER — Ambulatory Visit (INDEPENDENT_AMBULATORY_CARE_PROVIDER_SITE_OTHER)

## 2024-05-30 VITALS — BP 119/61 | HR 67 | Ht 63.0 in | Wt 141.0 lb

## 2024-05-30 DIAGNOSIS — Z0000 Encounter for general adult medical examination without abnormal findings: Secondary | ICD-10-CM

## 2024-05-30 NOTE — Patient Instructions (Addendum)
 [name] , Thank you for taking time out of your busy schedule to complete your Annual Wellness Visit with me. I enjoyed our conversation and look forward to speaking with you again next year. I, as well as your care team,  appreciate your ongoing commitment to your health goals. Please review the following plan we discussed and let me know if I can assist you in the future. Your Game plan/ To Do List    Referrals: If you haven't heard from the office you've been referred to, please reach out to them at the phone provided.   Follow up Visits: We will see or speak with you next year for your Next Medicare AWV with our clinical staff on 05/31/25 at 8:00a.m. Have you seen your provider in the last 6 months (3 months if uncontrolled diabetes)? Yes  Clinician Recommendations:  Aim for 30 minutes of exercise or brisk walking, 6-8 glasses of water, and 5 servings of fruits and vegetables each day.       This is a list of the screenings recommended for you:  Health Maintenance  Topic Date Due   COVID-19 Vaccine (3 - Moderna risk series) 10/11/2020   Eye exam for diabetics  07/07/2023   Medicare Annual Wellness Visit  04/28/2024   Flu Shot  05/25/2024   Hemoglobin A1C  07/26/2024   Complete foot exam   09/20/2024   DTaP/Tdap/Td vaccine (3 - Td or Tdap) 11/27/2024   Yearly kidney function blood test for diabetes  01/24/2025   Yearly kidney health urinalysis for diabetes  01/24/2025   Pneumococcal Vaccine for age over 91  Completed   DEXA scan (bone density measurement)  Completed   Zoster (Shingles) Vaccine  Completed   Hepatitis B Vaccine  Aged Out   HPV Vaccine  Aged Out   Meningitis B Vaccine  Aged Out    Advanced directives: (Copy Requested) Please bring a copy of your health care power of attorney and living will to the office to be added to your chart at your convenience. You can mail to [facility] [address] [place], [place] [address] or email to  [email or web address] Advance Care Planning is important because it:  [x]  Makes sure you receive the medical care that is consistent with your values, goals, and preferences  [x]  It provides guidance to your family and loved ones and reduces their decisional burden about whether or not they are making the right decisions based on your wishes.  Follow the link provided in your after visit summary or read over the paperwork we have mailed to you to help you started getting your Advance Directives in place. If you need assistance in completing these, please reach out to us  so that we can help you!  See attachments for Preventive Care and Fall Prevention Tips.

## 2024-05-30 NOTE — Progress Notes (Signed)
 Subjective:   [name] is a 83 y.o. who presents for a Medicare Wellness preventive visit.  As a reminder, Annual Wellness Visits don't include a physical exam, and some assessments may be limited, especially if this visit is performed virtually. We may recommend an in-person follow-up visit with your provider if needed.  Visit Complete: Virtual I connected with  [name] on 05/30/24 by a audio enabled telemedicine application and verified that I am speaking with the correct person using two identifiers.  Patient Location: Home  Provider Location: Home Office  I discussed the limitations of evaluation and management by telemedicine. The patient expressed understanding and agreed to proceed.  Vital Signs: Because this visit was a virtual/telehealth visit, some criteria may be missing or patient reported. Any vitals not documented were not able to be obtained and vitals that have been documented are patient reported.  VideoDeclined- This patient declined Librarian, academic. Therefore the visit was completed with audio only.  Persons Participating in Visit: Patient.  AWV Questionnaire: Yes: Patient Medicare AWV questionnaire was completed by the patient on 04/29/24; I have confirmed that all information answered by patient is correct and no changes since this date.        Objective:    Today's Vitals   05/30/24 0804  BP: 119/61  Pulse: 67  Weight: 141 lb (64 kg)  Height: 5' 3 (1.6 m)   Body mass index is 24.98 kg/m.     05/30/2024    8:07 AM 04/29/2023    9:57 AM 04/14/2022    2:10 PM 04/13/2021    2:28 PM 04/10/2020    1:46 PM 08/03/2019   11:29 AM 03/28/2019    1:37 PM  Advanced Directives  Does Patient Have a Medical Advance Directive? Yes Yes Yes Yes Yes Yes Yes  Type of Estate agent of [place];Living will Healthcare Power of [place];Living will Healthcare Power of [place];Living will Healthcare Power of  [place];Living will Living will;Out of facility DNR (pink MOST or yellow form);Healthcare Power of [employer] of [place];Living will Healthcare Power of [place];Living will  Does patient want to make changes to medical advance directive?     No - Patient declined No - Patient declined Yes (MAU/Ambulatory/Procedural Areas - Information given)   Copy of Healthcare Power of Attorney in Chart? No - copy requested No - copy requested No - copy requested No - copy requested No - copy requested No - copy requested No - copy requested      Data saved with a previous flowsheet row definition    Current Medications (verified) Outpatient Encounter Medications as of 05/30/2024  Medication Sig   acetaminophen  (TYLENOL ) 325 MG tablet Take 325 mg by mouth as needed.   amLODipine  (NORVASC ) 5 MG tablet Take 1 tablet (5 mg total) by mouth daily.   aspirin  81 MG tablet Take 81 mg by mouth every other day.   atorvastatin  (LIPITOR) 10 MG tablet Take 1 tablet (10 mg total) by mouth at bedtime.   azelastine  (ASTELIN ) 0.1 % nasal spray Place 1 spray into both nostrils 2 (two) times daily. Use with flonase  for nasal congestion/ drainage   benzonatate  (TESSALON ) 100 MG capsule TAKE ONE CAPSULE BY MOUTH THREE TIMES DAILY AS NEEDED   BESIVANCE 0.6 % SUSP Apply to eye.   Calcium  Carb-Cholecalciferol 600-200 MG-UNIT TABS Take 1 tablet by mouth 2 (two) times daily.   clobetasol (TEMOVATE) 0.05 % GEL Apply topically daily.   desonide  (  DESOWEN ) 0.05 % cream Apply topically 2 (two) times daily.   diclofenac  Sodium (VOLTAREN ) 1 % GEL Apply 4 g topically 4 (four) times daily as needed (arthritis).   Dulaglutide  (TRULICITY ) 0.75 MG/0.5ML SOPN Inject 0.75 mg into the skin every 7 (seven) days. Sees Endo   escitalopram  (LEXAPRO ) 20 MG tablet Take 1 tablet (20 mg total) by mouth daily.   esomeprazole  (NEXIUM ) 20 MG capsule Take 1 capsule (20 mg total) by mouth daily at 12 noon.   estradiol  (ESTRACE ) 0.1 MG/GM  vaginal cream Place 1 Applicatorful vaginally at bedtime. Per OB   fluticasone  (FLONASE ) 50 MCG/ACT nasal spray Place 2 sprays into both nostrils daily.   glucose blood (FREESTYLE LITE) test strip Use qid to check blood sugar. Dx E11.9   insulin  aspart (NOVOLOG  FLEXPEN) 100 UNIT/ML FlexPen BG less than 120 - no insulin ; 121 to 150 - 3 units; 151 to 200 - 4 units; 201 to 250 - 5 units; 251 to 300 - 6 units; 301-350 - 7 units, 351 or above 8units   insulin  glargine (LANTUS  SOLOSTAR) 100 UNIT/ML Solostar Pen Inject 22 Units into the skin every morning.   Insulin  Pen Needle 31G X 5 MM MISC 1 Device by Does not apply route 4 (four) times daily.   Multiple Vitamin (MULTIVITAMIN) tablet Take 1 tablet by mouth daily.   nystatin  powder APPLY UNDER BILATERAL BREASTS AND GROIN TWICE A DAY (If needed)   nystatin -triamcinolone  ointment (MYCOLOG) APPLY TOPICALLY 2 (TWO) TIMES DAILY IF NEEDED FOR rash.  NOT A PREVENTATIVE MEDICATION.   Polyethyl Glycol-Propyl Glycol 0.4-0.3 % SOLN Apply 1 drop to eye daily as needed (for dry eye relief).    polyethylene glycol (MIRALAX / GLYCOLAX) packet Take 17 g by mouth daily as needed.    Probiotic Product (PROBIOTIC ADVANCED PO) Take 1 capsule by mouth daily.    traZODone  (DESYREL ) 100 MG tablet Take 1 tablet (100 mg total) by mouth at bedtime.   triamcinolone  cream (KENALOG ) 0.1 % Apply 1 Application topically 2 (two) times daily. X7-14 days for rash.   No facility-administered encounter medications on file as of 05/30/2024.    Allergies (verified) Latex, Ambien [zolpidem], Augmentin [amoxicillin-pot clavulanate], Belsomra  [suvorexant ], Hydrocodone-acetaminophen , Hyoscyamine sulfate, Melatonin, Mobic [meloxicam], Morphine, Oxycodone, Tramadol, Celebrex [celecoxib], and Sulfa antibiotics   History: Past Medical History:  Diagnosis Date   Acute cystitis    Anxiety    Arthritis    Breast cancer detected by national screening programme (HCC) 03/12/2022   Depression     Diabetes mellitus type II    Diabetic retinopathy    DM type 2 causing CKD stage 3 (HCC)    Fibromyalgia    GERD (gastroesophageal reflux disease)    Heart murmur    Hyperlipidemia    Hypertension    IBS (irritable bowel syndrome)    IBS (irritable bowel syndrome)    Insomnia    Lichen planus    vulvar   Pancreatitis, gallstone    Postmenopausal    Radial scar of breast 06/04/2016   Urge incontinence of urine    Past Surgical History:  Procedure Laterality Date   ANKLE SURGERY  02/23/2004   BREAST LUMPECTOMY WITH RADIOACTIVE SEED LOCALIZATION Left 01/22/2016   Procedure: LEFT BREAST LUMPECTOMY WITH RADIOACTIVE SEED LOCALIZATION;  Surgeon: Deward Null III, MD;  Location: [facility];  Service: General;  Laterality: Left;   BREAST SURGERY     CATARACT EXTRACTION W/ INTRAOCULAR LENS IMPLANT  09/2003, 12/2003   CHOLECYSTECTOMY  10/25/2002  EYE SURGERY     laser treatments   JOINT REPLACEMENT     TOTAL KNEE ARTHROPLASTY     Left   TRIGGER FINGER RELEASE Left 01/30/2002   TUBAL LIGATION  10/25/1968   Family History  Problem Relation Age of Onset   Diabetes Mother    Hypertension Mother    Kidney disease Mother    Heart disease Mother    Peripheral vascular disease Mother        amputation   Cancer Father        prostate   Diabetes Father    Heart disease Father        CABG   Hypertension Father    Heart attack Father    Depression Sister    Hypertension Sister    Neuropathy Sister    Social History   Socioeconomic History   Marital status: Widowed    Spouse name: Not on file   Number of children: 3   Years of education: 61   Highest education level: Some college, no degree  Occupational History   Occupation: retired    Associate Professor: RETIRED    Comment: bookeeping  Tobacco Use   Smoking status: Never   Smokeless tobacco: Never  Vaping Use   Vaping status: Never Used  Substance and Sexual Activity   Alcohol use: Never   Drug use: Never    Sexual activity: Not Currently  Other Topics Concern   Not on file  Social History Narrative   Widowed in 2010; lives alone. Children live out of town   Social Drivers of Health   Financial Resource Strain: Low Risk  (01/11/2024)   Received from [facility]   Overall Financial Resource Strain (CARDIA)    Difficulty of Paying Living Expenses: Not very hard  Food Insecurity: No Food Insecurity (01/11/2024)   [facility]   Hunger Vital Sign    Within the past 12 months, you worried that your food would run out before you got the money to buy more.: Never true    Within the past 12 months, the food you bought just didn't last and you didn't have money to get more.: Never true  Transportation Needs: No Transportation Needs (05/30/2024)   PRAPARE - Administrator, Civil Service (Medical): No    Lack of Transportation (Non-Medical): No  Physical Activity: Insufficiently Active (12/20/2023)   Exercise Vital Sign    Days of Exercise per Week: 3 days    Minutes of Exercise per Session: 20 min  Stress: No Stress Concern Present (12/20/2023)   [employer] of Occupational Health - Occupational Stress Questionnaire    Feeling of Stress : Only a little  Social Connections: Moderately Isolated (12/20/2023)   Social Connection and Isolation Panel    Frequency of Communication with Friends and Family: More than three times a week    Frequency of Social Gatherings with Friends and Family: More than three times a week    Attends Religious Services: More than 4 times per year    Active Member of [employer] or Organizations: No    Attends Banker Meetings: Never    Marital Status: Widowed    Tobacco Counseling Counseling given: Yes    Clinical Intake:  Pre-visit preparation completed: Yes  Pain : No/denies pain     BMI - recorded: 24.98 Nutritional Status: BMI of 19-24  Normal Nutritional Risks: None Diabetes: No  Lab Results  Component Value  Date   HGBA1C 6.3 (  H) 01/25/2024   HGBA1C 6.2 (H) 07/26/2023   HGBA1C 6.5 (H) 01/25/2023     How often do you need to have someone help you when you read instructions, pamphlets, or other written materials from your doctor or pharmacy?: 1 - Never  Interpreter Needed?: No  Information entered by :: alia t/cma   Activities of Daily Living     04/29/2024    7:53 PM  In your present state of health, do you have any difficulty performing the following activities:  Hearing? 0  Vision? 0  Difficulty concentrating or making decisions? 0  Walking or climbing stairs? 0  Dressing or bathing? 0  Doing errands, shopping? 0  Preparing Food and eating ? N  Using the Toilet? N  In the past six months, have you accidently leaked urine? Y  Do you have problems with loss of bowel control? N  Managing your Medications? N  Managing your Finances? N  Housekeeping or managing your Housekeeping? N    Patient Care Team: Jolinda Norene HERO, DO as PCP - General (Family Medicine) Estelle Service, MD as Consulting Physician (Obstetrics and Gynecology) Gaston Hamilton, MD as Consulting Physician (Urology) Lavona Agent, MD as Consulting Physician (Cardiology) Roddie Bring, DPM as Consulting Physician (Podiatry) Alvia Norleen BIRCH, MD as Consulting Physician (Ophthalmology) Ladora Ross Lacy Phebe, MD as Referring Physician (Optometry) Hosie Norleen, MD (Endocrinology)  I have updated your Care Teams any recent Medical Services you may have received from other providers in the past year.     Assessment:   This is a routine wellness examination for [facility].  Hearing/Vision screen Hearing Screening - Comments:: Pt have some hearing dif Vision Screening - Comments:: Pt wear glasses/pt goes Dr. Erin Lee/and Dr. Norleen Cough at [facility]/last ov 2025   Goals Addressed             This Visit's Progress    Patient Stated       Pt would like for her health to be better       Depression Screen      05/30/2024    8:08 AM 01/25/2024   10:31 AM 12/20/2023    1:16 PM 07/27/2023   10:49 AM 03/30/2023   11:46 AM 03/30/2023   11:04 AM 01/25/2023   11:02 AM  PHQ 2/9 Scores  PHQ - 2 Score 1 0 0 0 1 0 2  PHQ- 9 Score 1 0 0 0 1  5    Fall Risk     04/29/2024    7:53 PM 01/25/2024   10:32 AM 12/20/2023    1:17 PM 07/27/2023   10:49 AM 04/29/2023    9:54 AM  Fall Risk   Falls in the past year? 1 0 0 0 0  Number falls in past yr:  0 0 0 0  Injury with Fall?  0 0 0 0  Risk for fall due to :  No Fall Risks No Fall Risks No Fall Risks No Fall Risks  Follow up  Falls evaluation completed Falls evaluation completed Education provided Falls prevention discussed    MEDICARE RISK AT HOME:  Medicare Risk at Home If so, are there any without handrails?: (Patient-Rptd) No Home free of loose throw rugs in walkways, pet beds, electrical cords, etc?: (Patient-Rptd) Yes Adequate lighting in your home to reduce risk of falls?: (Patient-Rptd) Yes Life alert?: (Patient-Rptd) Yes Use of a cane, walker or w/c?: (Patient-Rptd) No Grab bars in the bathroom?: (Patient-Rptd) Yes Shower chair or bench in  shower?: (Patient-Rptd) Yes Elevated toilet seat or a handicapped toilet?: (Patient-Rptd) Yes  TIMED UP AND GO:  Was the test performed?  no  Cognitive Function: 6CIT completed    03/02/2018    3:53 PM 01/19/2017    2:45 PM 01/16/2016    2:55 PM 12/30/2014   12:16 PM  MMSE - Mini Mental State Exam  Orientation to time 5 5  5  5    Orientation to Place 5 5  5  5    Registration 3 3  3  3    Attention/ Calculation 5 5  5  5    Recall 1 2  3  3    Language- name 2 objects 2 2  2  2    Language- repeat 1 1 1 1   Language- follow 3 step command 3 3  3  3    Language- read & follow direction 1 1  1  1    Write a sentence 1 1  1  1    Copy design 1 1  1  1    Total score 28 29  30  30       Data saved with a previous flowsheet row definition        05/30/2024    8:10 AM 04/29/2023    9:57 AM 04/14/2022    2:12 PM  04/10/2020    1:51 PM 03/28/2019    1:39 PM  6CIT Screen  What Year? 0 points 0 points 0 points 0 points 0 points  What month? 0 points 0 points 0 points 0 points 0 points  What time? 0 points 0 points 0 points 0 points 0 points  Count back from 20 0 points 0 points 0 points 0 points 0 points  Months in reverse 0 points 0 points 0 points 0 points 0 points  Repeat phrase 4 points 0 points 2 points 0 points 0 points  Total Score 4 points 0 points 2 points 0 points 0 points    Immunizations Immunization History  Administered Date(s) Administered   Fluad Quad(high Dose 65+) 07/09/2019, 09/08/2020, 07/28/2022   Fluad Trivalent(High Dose 65+) 07/27/2023   Influenza, High Dose Seasonal PF 07/19/2016, 10/13/2017, 08/28/2018   Influenza,inj,Quad PF,6+ Mos 07/30/2013, 07/24/2014, 08/04/2015   Moderna SARS-COV2 Booster Vaccination 09/13/2020   Moderna Sars-Covid-2 Vaccination 11/29/2019, 12/31/2019   Pneumococcal Conjugate-13 11/27/2014   Pneumococcal Polysaccharide-23 10/22/2009   Td 10/25/2004   Tdap 11/27/2014   Zoster Recombinant(Shingrix) 08/19/2021, 10/30/2021   Zoster, Live 04/06/2012    Screening Tests Health Maintenance  Topic Date Due   COVID-19 Vaccine (3 - Moderna risk series) 10/11/2020   OPHTHALMOLOGY EXAM  07/07/2023   INFLUENZA VACCINE  05/25/2024   HEMOGLOBIN A1C  07/26/2024   FOOT EXAM  09/20/2024   DTaP/Tdap/Td (3 - Td or Tdap) 11/27/2024   Diabetic kidney evaluation - eGFR measurement  01/24/2025   Diabetic kidney evaluation - Urine ACR  01/24/2025   Medicare Annual Wellness (AWV)  05/30/2025   Pneumococcal Vaccine: 50+ Years  Completed   DEXA SCAN  Completed   Zoster Vaccines- Shingrix  Completed   Hepatitis B Vaccines  Aged Out   HPV VACCINES  Aged Out   Meningococcal B Vaccine  Aged Out    Health Maintenance  Health Maintenance Due  Topic Date Due   COVID-19 Vaccine (3 - Moderna risk series) 10/11/2020   OPHTHALMOLOGY EXAM  07/07/2023   INFLUENZA  VACCINE  05/25/2024   Health Maintenance Items Addressed: See Nurse Notes at the end of this note  Additional Screening:  Vision Screening: Recommended annual ophthalmology exams for early detection of glaucoma and other disorders of the eye. Would you like a referral to an eye doctor? No    Dental Screening: Recommended annual dental exams for proper oral hygiene  Community Resource Referral / Chronic Care Management: CRR required this visit?  No   CCM required this visit?  No   Plan:    I have personally reviewed and noted the following in the patient's chart:   Medical and social history Use of alcohol, tobacco or illicit drugs  Current medications and supplements including opioid prescriptions. Patient is not currently taking opioid prescriptions. Functional ability and status Nutritional status Physical activity Advanced directives List of other physicians Hospitalizations, surgeries, and ER visits in previous 12 months Vitals Screenings to include cognitive, depression, and falls Referrals and appointments  In addition, I have reviewed and discussed with patient certain preventive protocols, quality metrics, and best practice recommendations. A written personalized care plan for preventive services as well as general preventive health recommendations were provided to patient.   Ozie Ned, CMA   05/30/2024   After Visit Summary: (MyChart) Due to this being a telephonic visit, the after visit summary with patients personalized plan was offered to patient via MyChart   Notes: Nothing significant to report at this time.

## 2024-06-12 DIAGNOSIS — M79675 Pain in left toe(s): Secondary | ICD-10-CM | POA: Diagnosis not present

## 2024-06-12 DIAGNOSIS — B351 Tinea unguium: Secondary | ICD-10-CM | POA: Diagnosis not present

## 2024-06-12 DIAGNOSIS — M79674 Pain in right toe(s): Secondary | ICD-10-CM | POA: Diagnosis not present

## 2024-06-12 DIAGNOSIS — L84 Corns and callosities: Secondary | ICD-10-CM | POA: Diagnosis not present

## 2024-06-12 DIAGNOSIS — E1142 Type 2 diabetes mellitus with diabetic polyneuropathy: Secondary | ICD-10-CM | POA: Diagnosis not present

## 2024-06-29 ENCOUNTER — Encounter (INDEPENDENT_AMBULATORY_CARE_PROVIDER_SITE_OTHER): Admitting: Ophthalmology

## 2024-06-29 DIAGNOSIS — Z7984 Long term (current) use of oral hypoglycemic drugs: Secondary | ICD-10-CM

## 2024-06-29 DIAGNOSIS — E113311 Type 2 diabetes mellitus with moderate nonproliferative diabetic retinopathy with macular edema, right eye: Secondary | ICD-10-CM | POA: Diagnosis not present

## 2024-06-29 DIAGNOSIS — I10 Essential (primary) hypertension: Secondary | ICD-10-CM

## 2024-06-29 DIAGNOSIS — H43813 Vitreous degeneration, bilateral: Secondary | ICD-10-CM | POA: Diagnosis not present

## 2024-06-29 DIAGNOSIS — E113392 Type 2 diabetes mellitus with moderate nonproliferative diabetic retinopathy without macular edema, left eye: Secondary | ICD-10-CM

## 2024-06-29 DIAGNOSIS — H35033 Hypertensive retinopathy, bilateral: Secondary | ICD-10-CM

## 2024-06-30 DIAGNOSIS — E1140 Type 2 diabetes mellitus with diabetic neuropathy, unspecified: Secondary | ICD-10-CM | POA: Diagnosis not present

## 2024-07-03 DIAGNOSIS — M7071 Other bursitis of hip, right hip: Secondary | ICD-10-CM | POA: Diagnosis not present

## 2024-07-03 DIAGNOSIS — M25552 Pain in left hip: Secondary | ICD-10-CM | POA: Diagnosis not present

## 2024-07-03 DIAGNOSIS — M7072 Other bursitis of hip, left hip: Secondary | ICD-10-CM | POA: Diagnosis not present

## 2024-07-09 ENCOUNTER — Other Ambulatory Visit: Payer: Self-pay | Admitting: Family Medicine

## 2024-07-09 DIAGNOSIS — E1159 Type 2 diabetes mellitus with other circulatory complications: Secondary | ICD-10-CM

## 2024-07-09 MED ORDER — AMLODIPINE BESYLATE 5 MG PO TABS
5.0000 mg | ORAL_TABLET | Freq: Every day | ORAL | 3 refills | Status: AC
Start: 2024-07-09 — End: ?

## 2024-07-09 NOTE — Telephone Encounter (Signed)
 Copied from CRM #[id number]. Topic: Clinical - Medication Question >> Jul 09, 2024 11:07 AM Antwanette L wrote: Reason for CRM: Patient is requesting a new prescription for Norvasc  5 MG to be sent to [facility], but it is not listed on the patient chart. Please contact the patient at [phone].   Preferred Pharmacy [facility]  [address] [place] [address] Phone: [phone] Fax: [phone]

## 2024-07-17 ENCOUNTER — Other Ambulatory Visit: Payer: Self-pay | Admitting: Family Medicine

## 2024-07-17 DIAGNOSIS — F3289 Other specified depressive episodes: Secondary | ICD-10-CM

## 2024-07-17 NOTE — Telephone Encounter (Signed)
 FYI Only or Action Required?: Action required by provider: medication refill request.  Patient was last seen in primary care on 01/25/2024 by Jolinda Norene HERO, DO.  Called Nurse Triage reporting Medication Refill.  Symptoms began today.  Interventions attempted: Nothing.  Symptoms are: stable.  Triage Disposition: No disposition on file.  Patient/caregiver understands and will follow disposition?:

## 2024-07-17 NOTE — Telephone Encounter (Unsigned)
 Copied from CRM [id number]. Topic: Clinical - Medication Refill >> Jul 17, 2024 10:18 AM Edsel HERO wrote: Medication: escitalopram  (LEXAPRO ) 20 MG tablet  Has the patient contacted their pharmacy? Yes  This is the patient's preferred pharmacy:  [facility] - [place], [place] - [address] [place] [address] Phone: [phone] Fax: [phone]  Is this the correct pharmacy for this prescription? Yes   Has the prescription been filled recently? Yes  Is the patient out of the medication? Yes  Has the patient been seen for an appointment in the last year OR does the patient have an upcoming appointment? Yes  Can we respond through MyChart? Yes

## 2024-07-18 MED ORDER — ESCITALOPRAM OXALATE 20 MG PO TABS: 20.0000 mg | ORAL_TABLET | Freq: Every day | ORAL | 3 refills | Status: AC

## 2024-07-23 ENCOUNTER — Other Ambulatory Visit: Payer: Self-pay | Admitting: Family Medicine

## 2024-07-23 DIAGNOSIS — Z794 Long term (current) use of insulin: Secondary | ICD-10-CM

## 2024-07-23 NOTE — Telephone Encounter (Unsigned)
 Copied from CRM [id number]. Topic: Clinical - Medication Refill >> Jul 23, 2024  3:51 PM Mia F wrote: Medication: insulin  glargine (LANTUS  SOLOSTAR) 100 UNIT/ML Solostar Pen   Has the patient contacted their pharmacy? Yes (Agent: If no, request that the patient contact the pharmacy for the refill. If patient does not wish to contact the pharmacy document the reason why and proceed with request.) (Agent: If yes, when and what did the pharmacy advise?)  This is the patient's preferred pharmacy:  [facility] - [place], [place] - [address] [place] [address] Phone: [phone] Fax: [phone]    Is this the correct pharmacy for this prescription? Yes If no, delete pharmacy and type the correct one.   Has the prescription been filled recently? No  Is the patient out of the medication? No  Has the patient been seen for an appointment in the last year OR does the patient have an upcoming appointment? Yes  Can we respond through MyChart? Yes  Agent: Please be advised that Rx refills may take up to 3 business days. We ask that you follow-up with your pharmacy.

## 2024-07-24 MED ORDER — LANTUS SOLOSTAR 100 UNIT/ML ~~LOC~~ SOPN: 22.0000 [IU] | PEN_INJECTOR | SUBCUTANEOUS | 99 refills | Status: DC

## 2024-07-25 NOTE — Progress Notes (Unsigned)
 [name] is a 83 y.o. female presents to office today for annual physical exam examination.    Type 2 Diabetes with hypertension, hyperlipidemia w/ CKD3a:  Sees endocrinology.  Reports that her Lantus  was increased to 22 units recently for blood sugars that were elevated after bilateral hip injections.  She brings me a list of blood sugars that typically are running 150s to 170s fasting.  No low blood sugars measured but lowest has been as low as 80 after a supper.  She uses sliding scale bolus insulin  as well.  She is compliant with Trulicity .  Reports some unexpected weight loss.  She denies any rectal bleeding, change in exercise tolerance.  Continues to have issues with overactive bladder and will may be getting some type of spinal stimulator soon for this.  She has an appointment with her hematologist/oncologist next month.  Endocrinology will be seen again in December.  Using CGM for monitoring, Freestyle Libre 2+  Last eye exam: UTD Last foot exam: UTD Last A1c:  Lab Results  Component Value Date   HGBA1C 6.3 (H) 01/25/2024   Nephropathy screen indicated?: UTD Last flu, zoster and/or pneumovax:  Immunization History  Administered Date(s) Administered   Fluad Quad(high Dose 65+) 07/09/2019, 09/08/2020, 07/28/2022   Fluad Trivalent(High Dose 65+) 07/27/2023   INFLUENZA, HIGH DOSE SEASONAL PF 07/19/2016, 10/13/2017, 08/28/2018   Influenza,inj,Quad PF,6+ Mos 07/30/2013, 07/24/2014, 08/04/2015   Moderna SARS-COV2 Booster Vaccination 09/13/2020   Moderna Sars-Covid-2 Vaccination 11/29/2019, 12/31/2019   Pneumococcal Conjugate-13 11/27/2014   Pneumococcal Polysaccharide-23 10/22/2009   Td 10/25/2004   Tdap 11/27/2014   Zoster Recombinant(Shingrix) 08/19/2021, 10/30/2021   Zoster, Live 04/06/2012    Occupation: retired, Substance use: none Health Maintenance Due  Topic Date Due   Influenza Vaccine  05/25/2024   HEMOGLOBIN A1C  07/26/2024    Immunization History   Administered Date(s) Administered   Fluad Quad(high Dose 65+) 07/09/2019, 09/08/2020, 07/28/2022   Fluad Trivalent(High Dose 65+) 07/27/2023   INFLUENZA, HIGH DOSE SEASONAL PF 07/19/2016, 10/13/2017, 08/28/2018   Influenza,inj,Quad PF,6+ Mos 07/30/2013, 07/24/2014, 08/04/2015   Moderna SARS-COV2 Booster Vaccination 09/13/2020   Moderna Sars-Covid-2 Vaccination 11/29/2019, 12/31/2019   Pneumococcal Conjugate-13 11/27/2014   Pneumococcal Polysaccharide-23 10/22/2009   Td 10/25/2004   Tdap 11/27/2014   Zoster Recombinant(Shingrix) 08/19/2021, 10/30/2021   Zoster, Live 04/06/2012   Past Medical History:  Diagnosis Date   Acute cystitis    Anxiety    Arthritis    Breast cancer detected by national screening programme (HCC) 03/12/2022   Depression    Diabetes mellitus type II    Diabetic retinopathy    DM type 2 causing CKD stage 3 (HCC)    Fibromyalgia    GERD (gastroesophageal reflux disease)    Heart murmur    Hyperlipidemia    Hypertension    IBS (irritable bowel syndrome)    IBS (irritable bowel syndrome)    Insomnia    Lichen planus    vulvar   Pancreatitis, gallstone    Postmenopausal    Radial scar of breast 06/04/2016   Urge incontinence of urine    Social History   Socioeconomic History   Marital status: Widowed    Spouse name: Not on file   Number of children: 3   Years of education: 53   Highest education level: Some college, no degree  Occupational History   Occupation: retired    Associate Professor: RETIRED    Comment: bookeeping  Tobacco Use   Smoking status: Never   Smokeless  tobacco: Never  Vaping Use   Vaping status: Never Used  Substance and Sexual Activity   Alcohol use: Never   Drug use: Never   Sexual activity: Not Currently  Other Topics Concern   Not on file  Social History Narrative   Widowed in 2010; lives alone. Children live out of town   Social Drivers of Health   Financial Resource Strain: Low Risk  (07/26/2024)   Overall Financial  Resource Strain (CARDIA)    Difficulty of Paying Living Expenses: Not hard at all  Food Insecurity: No Food Insecurity (07/26/2024)   Hunger Vital Sign    Worried About Running Out of Food in the Last Year: Never true    Ran Out of Food in the Last Year: Never true  Transportation Needs: Unmet Transportation Needs (07/26/2024)   PRAPARE - Administrator, Civil Service (Medical): Yes    Lack of Transportation (Non-Medical): No  Physical Activity: Insufficiently Active (07/26/2024)   Exercise Vital Sign    Days of Exercise per Week: 3 days    Minutes of Exercise per Session: 30 min  Stress: No Stress Concern Present (07/26/2024)   [employer] of Occupational Health - Occupational Stress Questionnaire    Feeling of Stress: Only a little  Social Connections: Moderately Isolated (07/26/2024)   Social Connection and Isolation Panel    Frequency of Communication with Friends and Family: More than three times a week    Frequency of Social Gatherings with Friends and Family: Once a week    Attends Religious Services: 1 to 4 times per year    Active Member of [employer] or Organizations: No    Attends Banker Meetings: Not on file    Marital Status: Widowed  Intimate Partner Violence: Not At Risk (05/30/2024)   Humiliation, Afraid, Rape, and Kick questionnaire    Fear of Current or Ex-Partner: No    Emotionally Abused: No    Physically Abused: No    Sexually Abused: No   Past Surgical History:  Procedure Laterality Date   ANKLE SURGERY  02/23/2004   BREAST LUMPECTOMY WITH RADIOACTIVE SEED LOCALIZATION Left 01/22/2016   Procedure: LEFT BREAST LUMPECTOMY WITH RADIOACTIVE SEED LOCALIZATION;  Surgeon: Deward Null III, MD;  Location: [facility];  Service: General;  Laterality: Left;   BREAST SURGERY     CATARACT EXTRACTION W/ INTRAOCULAR LENS IMPLANT  09/2003, 12/2003   CHOLECYSTECTOMY  10/25/2002   EYE SURGERY     laser treatments   JOINT REPLACEMENT      TOTAL KNEE ARTHROPLASTY     Left   TRIGGER FINGER RELEASE Left 01/30/2002   TUBAL LIGATION  10/25/1968   Family History  Problem Relation Age of Onset   Diabetes Mother    Hypertension Mother    Kidney disease Mother    Heart disease Mother    Peripheral vascular disease Mother        amputation   Cancer Father        prostate   Diabetes Father    Heart disease Father        CABG   Hypertension Father    Heart attack Father    Depression Sister    Hypertension Sister    Neuropathy Sister     Current Outpatient Medications:    acetaminophen  (TYLENOL ) 325 MG tablet, Take 325 mg by mouth as needed., Disp: , Rfl:    amLODipine  (NORVASC ) 5 MG tablet, Take 1 tablet (5 mg total) by mouth  daily., Disp: 90 tablet, Rfl: 3   aspirin  81 MG tablet, Take 81 mg by mouth every other day., Disp: , Rfl:    atorvastatin  (LIPITOR) 10 MG tablet, Take 1 tablet (10 mg total) by mouth at bedtime., Disp: 90 tablet, Rfl: 1   azelastine  (ASTELIN ) 0.1 % nasal spray, Place 1 spray into both nostrils 2 (two) times daily. Use with flonase  for nasal congestion/ drainage, Disp: 30 mL, Rfl: 12   benzonatate  (TESSALON ) 100 MG capsule, TAKE ONE CAPSULE BY MOUTH THREE TIMES DAILY AS NEEDED, Disp: 20 capsule, Rfl: 0   BESIVANCE 0.6 % SUSP, Apply to eye., Disp: , Rfl:    Calcium  Carb-Cholecalciferol 600-200 MG-UNIT TABS, Take 1 tablet by mouth 2 (two) times daily., Disp: , Rfl:    clobetasol (TEMOVATE) 0.05 % GEL, Apply topically daily., Disp: , Rfl:    desonide  (DESOWEN ) 0.05 % cream, Apply topically 2 (two) times daily., Disp: 30 g, Rfl: 1   diclofenac  Sodium (VOLTAREN ) 1 % GEL, Apply 4 g topically 4 (four) times daily as needed (arthritis)., Disp: 400 g, Rfl: prn   Dulaglutide  (TRULICITY ) 0.75 MG/0.5ML SOPN, Inject 0.75 mg into the skin every 7 (seven) days. Sees Endo, Disp: , Rfl:    escitalopram  (LEXAPRO ) 20 MG tablet, Take 1 tablet (20 mg total) by mouth daily., Disp: 90 tablet, Rfl: 3   esomeprazole  (NEXIUM )  20 MG capsule, Take 1 capsule (20 mg total) by mouth daily at 12 noon., Disp: 90 capsule, Rfl: 3   estradiol  (ESTRACE ) 0.1 MG/GM vaginal cream, Place 1 Applicatorful vaginally at bedtime. Per OB, Disp: , Rfl:    fluticasone  (FLONASE ) 50 MCG/ACT nasal spray, Place 2 sprays into both nostrils daily., Disp: 16 g, Rfl: 6   insulin  aspart (NOVOLOG  FLEXPEN) 100 UNIT/ML FlexPen, BG less than 120 - no insulin ; 121 to 150 - 3 units; 151 to 200 - 4 units; 201 to 250 - 5 units; 251 to 300 - 6 units; 301-350 - 7 units, 351 or above 8units, Disp: 60 mL, Rfl: PRN   insulin  glargine (LANTUS  SOLOSTAR) 100 UNIT/ML Solostar Pen, Inject 22 Units into the skin every morning., Disp: 45 mL, Rfl: PRN   Insulin  Pen Needle 31G X 5 MM MISC, 1 Device by Does not apply route 4 (four) times daily., Disp: 300 each, Rfl: 4   Multiple Vitamin (MULTIVITAMIN) tablet, Take 1 tablet by mouth daily., Disp: , Rfl:    nystatin  powder, APPLY UNDER BILATERAL BREASTS AND GROIN TWICE A DAY (If needed), Disp: 60 g, Rfl: 1   nystatin -triamcinolone  ointment (MYCOLOG), APPLY TOPICALLY 2 (TWO) TIMES DAILY IF NEEDED FOR rash.  NOT A PREVENTATIVE MEDICATION., Disp: 60 g, Rfl: 3   Polyethyl Glycol-Propyl Glycol 0.4-0.3 % SOLN, Apply 1 drop to eye daily as needed (for dry eye relief). , Disp: , Rfl:    polyethylene glycol (MIRALAX / GLYCOLAX) packet, Take 17 g by mouth daily as needed. , Disp: , Rfl:    Probiotic Product (PROBIOTIC ADVANCED PO), Take 1 capsule by mouth daily. , Disp: , Rfl:    traZODone  (DESYREL ) 100 MG tablet, Take 1 tablet (100 mg total) by mouth at bedtime., Disp: 90 tablet, Rfl: PRN   triamcinolone  cream (KENALOG ) 0.1 %, Apply 1 Application topically 2 (two) times daily. X7-14 days for rash., Disp: 30 g, Rfl: 0   glucose blood (FREESTYLE LITE) test strip, Use qid to check blood sugar. Dx E11.9 (Patient not taking: Reported on 07/27/2024), Disp: 400 each, Rfl: 2  Allergies  Allergen  Reactions   Latex Anaphylaxis and Rash   Ambien  [Zolpidem] Other (See Comments)    Hallucinations   Augmentin [Amoxicillin-Pot Clavulanate] Nausea And Vomiting   Belsomra  [Suvorexant ] Other (See Comments)    Hallucinations   Hydrocodone-Acetaminophen  Other (See Comments)    hallucinations   Hyoscyamine Sulfate Other (See Comments)   Melatonin Other (See Comments)    Adverse reaction-keeps me awake   Mobic [Meloxicam]    Morphine Nausea And Vomiting    REACTION: hallucinations,GI upset   Oxycodone Other (See Comments)    Hallucinations   Tramadol     Unknown reaction   Celebrex [Celecoxib] Itching and Rash   Sulfa Antibiotics Rash     ROS: Review of Systems Pertinent items noted in HPI and remainder of comprehensive ROS otherwise negative.    Physical exam BP 125/70   Pulse 74   Temp (!) 96.8 F (36 C)   Ht 5' 3 (1.6 m)   Wt 135 lb 6 oz (61.4 kg)   SpO2 98%   BMI 23.98 kg/m  General appearance: alert, cooperative, appears stated age, and no distress Head: Normocephalic, without obvious abnormality, atraumatic Eyes: negative findings: lids and lashes normal, conjunctivae and sclerae normal, corneas clear, and pupils equal, round, reactive to light and accomodation Ears: Decreased light reflex on right TM.  Scant cerumen at the 6 o'clock position of the external auditory canal.  Otherwise unremarkable bilaterally Nose: Nares normal. Septum midline. Mucosa normal. No drainage or sinus tenderness. Throat: lips, mucosa, and tongue normal; teeth and gums normal Neck: no adenopathy, no carotid bruit, supple, symmetrical, trachea midline, and thyroid  not enlarged, symmetric, no tenderness/mass/nodules Back: Slight increased kyphosis of thoracic spine.  Otherwise unremarkable Lungs: clear to auscultation bilaterally Heart: regular rate and rhythm, S1, S2 normal, no murmur, click, rub or gallop Abdomen: Mild periumbilical TTP.  No rebound, guarding.  No palpable masses or hepatosplenomegaly. Extremities: extremities normal,  atraumatic, no cyanosis or edema and varicose veins noted Pulses: 2+ and symmetric Skin: She has an abrasion versus ulcerative lesion at the right side of the neck just near the jawline.  Has an abrasion along the right forearm.  Small area of ecchymosis noted along the left anterior neck Lymph nodes: Cervical, supraclavicular, and axillary nodes normal. Neurologic: Grossly normal      05/30/2024    8:08 AM 01/25/2024   10:31 AM 12/20/2023    1:16 PM  Depression screen PHQ 2/9  Decreased Interest 0 0 0  Down, Depressed, Hopeless 1 0 0  PHQ - 2 Score 1 0 0  Altered sleeping 0 0 0  Tired, decreased energy 0 0 0  Change in appetite 0 0 0  Feeling bad or failure about yourself  0 0 0  Trouble concentrating 0 0 0  Moving slowly or fidgety/restless 0 0 0  Suicidal thoughts 0 0 0  PHQ-9 Score 1 0 0  Difficult doing work/chores Not difficult at all Not difficult at all       01/25/2024   10:31 AM 12/20/2023    1:16 PM 07/27/2023   10:49 AM 03/30/2023   11:47 AM  GAD 7 : Generalized Anxiety Score  Nervous, Anxious, on Edge 0 0 0 1  Control/stop worrying 0 0 0 0  Worry too much - different things 0 0 0 0  Trouble relaxing 0 0 0 0  Restless 0 0 0 0  Easily annoyed or irritable 0 0 0 0  Afraid - awful might happen 0 0 0  0  Total GAD 7 Score 0 0 0 1  Anxiety Difficulty Not difficult at all Not difficult at all Not difficult at all Not difficult at all    No results found for this or any previous visit (from the past 2160 hours).   Assessment/ Plan: Shalae C Moorman here for annual physical exam.   Annual physical exam  Dysfunction of Eustachian tube, right - Plan: desloratadine (CLARINEX) 5 MG tablet  Unexplained weight loss  Type 2 diabetes mellitus with stage 3a chronic kidney disease, with long-term current use of insulin  (HCC) - Plan: Bayer DCA Hb A1c Waived, CMP14+EGFR, CBC with Differential, VITAMIN D  25 Hydroxy (Vit-D Deficiency, Fractures)  Hypertension associated with diabetes  (HCC) - Plan: CMP14+EGFR  Hyperlipidemia associated with type 2 diabetes mellitus (HCC) - Plan: CMP14+EGFR, Lipid Panel, TSH, atorvastatin  (LIPITOR) 10 MG tablet, DISCONTINUED: atorvastatin  (LIPITOR) 10 MG tablet  Diabetic retinopathy without macular edema associated with type 2 diabetes mellitus, unspecified laterality, unspecified retinopathy severity (HCC) - Plan: CMP14+EGFR  Diabetic polyneuropathy associated with type 2 diabetes mellitus (HCC) - Plan: CMP14+EGFR  Skin lesion of neck  Gastroesophageal reflux disease without esophagitis - Plan: esomeprazole  (NEXIUM ) 20 MG capsule, DISCONTINUED: esomeprazole  (NEXIUM ) 20 MG capsule   Influenza vaccination administered.    No evidence of bacterial infection in that right ear.  Clarinex added for as needed use as I suspect eustachian tube dysfunction.  Uncertain as to where this unexplained weight loss is coming from.  6 pounds down since April.  No changes to diet, exercise or medication regimen.  Not endorsing any red flag signs or symptoms otherwise.  Her Trulicity  was de-escalated over a year ago and her weight had stabilized.  She has had increased dose of insulin  so if anything should be putting on weight.  Has a history of breast cancer so would certainly like follow-up with oncology and potentially gastroenterology if labs are unremarkable today.  Continue follow-up with endocrinology.  Up-to-date on urine microalbumin, eye exam and foot exam.  Check renal function, fasting labs.  Continue to follow-up with specialists for retinopathy.  Okay to increase to 23 units of Lantus  if blood sugars remain above 150 fasting in the morning.  Continue CGM for close glucose monitoring  There is a skin lesion on the right side of the neck that may be an abrasion versus some type of skin cancer and have asked that she watch this.  If does not resolve, low threshold to have this further evaluated  GERD is stable.  PPI sent  Counseled on healthy  lifestyle choices, including diet (rich in fruits, vegetables and lean meats and low in salt and simple carbohydrates) and exercise (at least 30 minutes of moderate physical activity daily).  Patient to follow up 37m for weight check.  Daniel Ritthaler M. Jolinda, DO

## 2024-07-27 ENCOUNTER — Encounter: Payer: Self-pay | Admitting: Family Medicine

## 2024-07-27 ENCOUNTER — Ambulatory Visit (INDEPENDENT_AMBULATORY_CARE_PROVIDER_SITE_OTHER): Admitting: Family Medicine

## 2024-07-27 VITALS — BP 125/70 | HR 74 | Temp 96.8°F | Ht 63.0 in | Wt 135.4 lb

## 2024-07-27 DIAGNOSIS — E1122 Type 2 diabetes mellitus with diabetic chronic kidney disease: Secondary | ICD-10-CM | POA: Diagnosis not present

## 2024-07-27 DIAGNOSIS — Z0001 Encounter for general adult medical examination with abnormal findings: Secondary | ICD-10-CM

## 2024-07-27 DIAGNOSIS — Z23 Encounter for immunization: Secondary | ICD-10-CM | POA: Diagnosis not present

## 2024-07-27 DIAGNOSIS — E11319 Type 2 diabetes mellitus with unspecified diabetic retinopathy without macular edema: Secondary | ICD-10-CM

## 2024-07-27 DIAGNOSIS — E1169 Type 2 diabetes mellitus with other specified complication: Secondary | ICD-10-CM

## 2024-07-27 DIAGNOSIS — Z0000 Encounter for general adult medical examination without abnormal findings: Secondary | ICD-10-CM

## 2024-07-27 DIAGNOSIS — H6991 Unspecified Eustachian tube disorder, right ear: Secondary | ICD-10-CM | POA: Diagnosis not present

## 2024-07-27 DIAGNOSIS — Z794 Long term (current) use of insulin: Secondary | ICD-10-CM

## 2024-07-27 DIAGNOSIS — R634 Abnormal weight loss: Secondary | ICD-10-CM

## 2024-07-27 DIAGNOSIS — E1159 Type 2 diabetes mellitus with other circulatory complications: Secondary | ICD-10-CM

## 2024-07-27 DIAGNOSIS — I152 Hypertension secondary to endocrine disorders: Secondary | ICD-10-CM

## 2024-07-27 DIAGNOSIS — K219 Gastro-esophageal reflux disease without esophagitis: Secondary | ICD-10-CM

## 2024-07-27 DIAGNOSIS — E785 Hyperlipidemia, unspecified: Secondary | ICD-10-CM | POA: Diagnosis not present

## 2024-07-27 DIAGNOSIS — E1142 Type 2 diabetes mellitus with diabetic polyneuropathy: Secondary | ICD-10-CM | POA: Diagnosis not present

## 2024-07-27 DIAGNOSIS — N1831 Chronic kidney disease, stage 3a: Secondary | ICD-10-CM

## 2024-07-27 DIAGNOSIS — L989 Disorder of the skin and subcutaneous tissue, unspecified: Secondary | ICD-10-CM

## 2024-07-27 LAB — BAYER DCA HB A1C WAIVED: HB A1C (BAYER DCA - WAIVED): 7 % — ABNORMAL HIGH (ref 4.8–5.6)

## 2024-07-27 MED ORDER — ATORVASTATIN CALCIUM 10 MG PO TABS: 10.0000 mg | ORAL_TABLET | Freq: Every day | ORAL | 1 refills | Status: AC

## 2024-07-27 MED ORDER — DESLORATADINE 5 MG PO TABS: 5.0000 mg | ORAL_TABLET | Freq: Every day | ORAL | 0 refills | Status: AC | PRN

## 2024-07-27 MED ORDER — ESOMEPRAZOLE MAGNESIUM 20 MG PO CPDR: 20.0000 mg | DELAYED_RELEASE_CAPSULE | Freq: Every day | ORAL | 3 refills | Status: AC

## 2024-07-27 MED ORDER — ATORVASTATIN CALCIUM 10 MG PO TABS: 10.0000 mg | ORAL_TABLET | Freq: Every day | ORAL | 1 refills | Status: DC

## 2024-07-27 MED ORDER — ESOMEPRAZOLE MAGNESIUM 20 MG PO CPDR: 20.0000 mg | DELAYED_RELEASE_CAPSULE | Freq: Every day | ORAL | 3 refills | Status: DC

## 2024-07-27 NOTE — Patient Instructions (Signed)
 If your blood work is unremarkable, I would favor you seeing your gastroenterologist (the person that does colonoscopies) to further evaluate your weight loss.  You are 141 pounds when we saw you in April and you are now down to 135 pounds.

## 2024-07-28 LAB — CBC WITH DIFFERENTIAL/PLATELET
Basophils Absolute: 0 x10E3/uL (ref 0.0–0.2)
Basos: 0 %
EOS (ABSOLUTE): 0.1 x10E3/uL (ref 0.0–0.4)
Eos: 1 %
Hematocrit: 43.7 % (ref 34.0–46.6)
Hemoglobin: 14.4 g/dL (ref 11.1–15.9)
Immature Grans (Abs): 0 x10E3/uL (ref 0.0–0.1)
Immature Granulocytes: 0 %
Lymphocytes Absolute: 1.2 x10E3/uL (ref 0.7–3.1)
Lymphs: 18 %
MCH: 31.9 pg (ref 26.6–33.0)
MCHC: 33 g/dL (ref 31.5–35.7)
MCV: 97 fL (ref 79–97)
Monocytes Absolute: 0.4 x10E3/uL (ref 0.1–0.9)
Monocytes: 6 %
Neutrophils Absolute: 5 x10E3/uL (ref 1.4–7.0)
Neutrophils: 75 %
Platelets: 157 x10E3/uL (ref 150–450)
RBC: 4.51 x10E6/uL (ref 3.77–5.28)
RDW: 13.2 % (ref 11.7–15.4)
WBC: 6.7 x10E3/uL (ref 3.4–10.8)

## 2024-07-28 LAB — CMP14+EGFR
ALT: 53 IU/L — ABNORMAL HIGH (ref 0–32)
AST: 40 IU/L (ref 0–40)
Albumin: 4.1 g/dL (ref 3.7–4.7)
Alkaline Phosphatase: 60 IU/L (ref 48–129)
BUN/Creatinine Ratio: 20 (ref 12–28)
BUN: 23 mg/dL (ref 8–27)
Bilirubin Total: 0.5 mg/dL (ref 0.0–1.2)
CO2: 27 mmol/L (ref 20–29)
Calcium: 9.6 mg/dL (ref 8.7–10.3)
Chloride: 97 mmol/L (ref 96–106)
Creatinine, Ser: 1.13 mg/dL — ABNORMAL HIGH (ref 0.57–1.00)
Globulin, Total: 2.2 g/dL (ref 1.5–4.5)
Glucose: 191 mg/dL — ABNORMAL HIGH (ref 70–99)
Potassium: 4.3 mmol/L (ref 3.5–5.2)
Sodium: 139 mmol/L (ref 134–144)
Total Protein: 6.3 g/dL (ref 6.0–8.5)
eGFR: 48 mL/min/1.73 — ABNORMAL LOW (ref >59)

## 2024-07-28 LAB — LIPID PANEL
Chol/HDL Ratio: 1.8 ratio (ref 0.0–4.4)
Cholesterol, Total: 146 mg/dL (ref 100–199)
HDL: 80 mg/dL (ref >39)
LDL Chol Calc (NIH): 51 mg/dL (ref 0–99)
Triglycerides: 77 mg/dL (ref 0–149)
VLDL Cholesterol Cal: 15 mg/dL (ref 5–40)

## 2024-07-28 LAB — VITAMIN D 25 HYDROXY (VIT D DEFICIENCY, FRACTURES): Vit D, 25-Hydroxy: 58.3 ng/mL (ref 30.0–100.0)

## 2024-07-28 LAB — TSH: TSH: 1.42 u[IU]/mL (ref 0.450–4.500)

## 2024-07-30 ENCOUNTER — Ambulatory Visit: Payer: Self-pay | Admitting: Family Medicine

## 2024-08-21 DIAGNOSIS — Z170 Estrogen receptor positive status [ER+]: Secondary | ICD-10-CM | POA: Diagnosis not present

## 2024-08-21 DIAGNOSIS — C50511 Malignant neoplasm of lower-outer quadrant of right female breast: Secondary | ICD-10-CM | POA: Diagnosis not present

## 2024-08-21 DIAGNOSIS — D696 Thrombocytopenia, unspecified: Secondary | ICD-10-CM | POA: Diagnosis not present

## 2024-08-21 DIAGNOSIS — L439 Lichen planus, unspecified: Secondary | ICD-10-CM | POA: Diagnosis not present

## 2024-08-22 ENCOUNTER — Encounter (INDEPENDENT_AMBULATORY_CARE_PROVIDER_SITE_OTHER): Admitting: Ophthalmology

## 2024-08-23 DIAGNOSIS — M79675 Pain in left toe(s): Secondary | ICD-10-CM | POA: Diagnosis not present

## 2024-08-23 DIAGNOSIS — L84 Corns and callosities: Secondary | ICD-10-CM | POA: Diagnosis not present

## 2024-08-23 DIAGNOSIS — M79674 Pain in right toe(s): Secondary | ICD-10-CM | POA: Diagnosis not present

## 2024-08-23 DIAGNOSIS — E1142 Type 2 diabetes mellitus with diabetic polyneuropathy: Secondary | ICD-10-CM | POA: Diagnosis not present

## 2024-08-23 DIAGNOSIS — B351 Tinea unguium: Secondary | ICD-10-CM | POA: Diagnosis not present

## 2024-08-27 DIAGNOSIS — M160 Bilateral primary osteoarthritis of hip: Secondary | ICD-10-CM | POA: Diagnosis not present

## 2024-08-27 DIAGNOSIS — M25552 Pain in left hip: Secondary | ICD-10-CM | POA: Diagnosis not present

## 2024-08-27 DIAGNOSIS — M25551 Pain in right hip: Secondary | ICD-10-CM | POA: Diagnosis not present

## 2024-09-03 ENCOUNTER — Encounter (INDEPENDENT_AMBULATORY_CARE_PROVIDER_SITE_OTHER): Admitting: Ophthalmology

## 2024-09-03 DIAGNOSIS — H35033 Hypertensive retinopathy, bilateral: Secondary | ICD-10-CM

## 2024-09-03 DIAGNOSIS — E113311 Type 2 diabetes mellitus with moderate nonproliferative diabetic retinopathy with macular edema, right eye: Secondary | ICD-10-CM | POA: Diagnosis not present

## 2024-09-03 DIAGNOSIS — Z7984 Long term (current) use of oral hypoglycemic drugs: Secondary | ICD-10-CM | POA: Diagnosis not present

## 2024-09-03 DIAGNOSIS — H43813 Vitreous degeneration, bilateral: Secondary | ICD-10-CM | POA: Diagnosis not present

## 2024-09-03 DIAGNOSIS — I10 Essential (primary) hypertension: Secondary | ICD-10-CM | POA: Diagnosis not present

## 2024-09-03 DIAGNOSIS — E113392 Type 2 diabetes mellitus with moderate nonproliferative diabetic retinopathy without macular edema, left eye: Secondary | ICD-10-CM | POA: Diagnosis not present

## 2024-09-04 DIAGNOSIS — M7072 Other bursitis of hip, left hip: Secondary | ICD-10-CM | POA: Diagnosis not present

## 2024-09-04 DIAGNOSIS — R748 Abnormal levels of other serum enzymes: Secondary | ICD-10-CM | POA: Diagnosis not present

## 2024-09-04 DIAGNOSIS — N183 Chronic kidney disease, stage 3 unspecified: Secondary | ICD-10-CM | POA: Diagnosis not present

## 2024-09-04 DIAGNOSIS — M25551 Pain in right hip: Secondary | ICD-10-CM | POA: Diagnosis not present

## 2024-09-04 DIAGNOSIS — M7071 Other bursitis of hip, right hip: Secondary | ICD-10-CM | POA: Diagnosis not present

## 2024-09-04 DIAGNOSIS — M25552 Pain in left hip: Secondary | ICD-10-CM | POA: Diagnosis not present

## 2024-09-05 ENCOUNTER — Encounter: Admitting: Family Medicine

## 2024-09-14 DIAGNOSIS — M1612 Unilateral primary osteoarthritis, left hip: Secondary | ICD-10-CM | POA: Diagnosis not present

## 2024-09-28 DIAGNOSIS — E1140 Type 2 diabetes mellitus with diabetic neuropathy, unspecified: Secondary | ICD-10-CM | POA: Diagnosis not present

## 2024-10-29 ENCOUNTER — Encounter (INDEPENDENT_AMBULATORY_CARE_PROVIDER_SITE_OTHER): Admitting: Ophthalmology

## 2024-10-29 DIAGNOSIS — H43813 Vitreous degeneration, bilateral: Secondary | ICD-10-CM

## 2024-10-29 DIAGNOSIS — Z7984 Long term (current) use of oral hypoglycemic drugs: Secondary | ICD-10-CM

## 2024-10-29 DIAGNOSIS — I10 Essential (primary) hypertension: Secondary | ICD-10-CM | POA: Diagnosis not present

## 2024-10-29 DIAGNOSIS — E113311 Type 2 diabetes mellitus with moderate nonproliferative diabetic retinopathy with macular edema, right eye: Secondary | ICD-10-CM | POA: Diagnosis not present

## 2024-10-29 DIAGNOSIS — E113392 Type 2 diabetes mellitus with moderate nonproliferative diabetic retinopathy without macular edema, left eye: Secondary | ICD-10-CM | POA: Diagnosis not present

## 2024-10-29 DIAGNOSIS — H35033 Hypertensive retinopathy, bilateral: Secondary | ICD-10-CM

## 2024-11-02 ENCOUNTER — Other Ambulatory Visit: Payer: Self-pay | Admitting: Family Medicine

## 2024-11-13 ENCOUNTER — Telehealth: Payer: Self-pay | Admitting: Family Medicine

## 2024-11-13 NOTE — Telephone Encounter (Signed)
 Called and spoke with patient and made her aware that PCP sent 90 tabs of this medicine in for her on 11/01/2024. Patient voiced understanding.     Copied from CRM #[id number]. Topic: Clinical - Medication Refill >> Nov 13, 2024  4:13 PM Fredrica W wrote: Medication: traZODone  (DESYREL ) 100 MG tablet  Has the patient contacted their pharmacy? No (Agent: If no, request that the patient contact the pharmacy for the refill. If patient does not wish to contact the pharmacy document the reason why and proceed with request.) (Agent: If yes, when and what did the pharmacy advise?) No refills   This is the patient's preferred pharmacy:  [facility] - [place], [place] - [address] [place] [address] Phone: [phone] Fax: [phone]   Is this the correct pharmacy for this prescription? Yes If no, delete pharmacy and type the correct one.   Has the prescription been filled recently? No  Is the patient out of the medication? No  Has the patient been seen for an appointment in the last year OR does the patient have an upcoming appointment? Yes  Can we respond through MyChart? No  Agent: Please be advised that Rx refills may take up to 3 business days. We ask that you follow-up with your pharmacy.

## 2024-11-28 ENCOUNTER — Other Ambulatory Visit: Payer: Self-pay | Admitting: Family Medicine

## 2024-11-28 DIAGNOSIS — E1122 Type 2 diabetes mellitus with diabetic chronic kidney disease: Secondary | ICD-10-CM

## 2024-11-28 MED ORDER — LANTUS SOLOSTAR 100 UNIT/ML ~~LOC~~ SOPN: 22.0000 [IU] | PEN_INJECTOR | SUBCUTANEOUS | 0 refills | Status: AC

## 2024-11-28 NOTE — Telephone Encounter (Signed)
 Copied from CRM #[id number]. Topic: Clinical - Medication Refill >> Nov 28, 2024  9:45 AM Deaijah H wrote: Medication: nsulin glargine (LANTUS  SOLOSTAR) 100 UNIT/ML Solostar Pen  Has the patient contacted their pharmacy? No (Agent: If no, request that the patient contact the pharmacy for the refill. If patient does not wish to contact the pharmacy document the reason why and proceed with request.) no script in their system for it (Agent: If yes, when and what did the pharmacy advise?)  This is the patient's preferred pharmacy:   [facility] [place], [place] - [address] [place] [address] Phone: [phone] Fax: [phone]  Is this the correct pharmacy for this prescription? Yes If no, delete pharmacy and type the correct one.   Has the prescription been filled recently? No  Is the patient out of the medication? No, almost out  Has the patient been seen for an appointment in the last year OR does the patient have an upcoming appointment? Yes  Can we respond through MyChart? Yes  Agent: Please be advised that Rx refills may take up to 3 business days. We ask that you follow-up with your pharmacy.

## 2024-11-29 ENCOUNTER — Other Ambulatory Visit: Payer: Self-pay | Admitting: Family Medicine

## 2024-11-29 DIAGNOSIS — F5101 Primary insomnia: Secondary | ICD-10-CM

## 2024-12-13 ENCOUNTER — Other Ambulatory Visit

## 2024-12-17 ENCOUNTER — Ambulatory Visit: Admitting: Family Medicine

## 2024-12-24 ENCOUNTER — Encounter (INDEPENDENT_AMBULATORY_CARE_PROVIDER_SITE_OTHER): Admitting: Ophthalmology

## 2025-05-31 ENCOUNTER — Ambulatory Visit

## 2025-08-02 ENCOUNTER — Encounter: Admitting: Family Medicine
